# Patient Record
Sex: Male | Born: 1970 | Race: White | Hispanic: No | Marital: Single | Smoking: Current every day smoker
Health system: Southern US, Community
[De-identification: ages and names within clinical notes are randomized; demographics above are authoritative.]

## PROBLEM LIST (undated history)

## (undated) DIAGNOSIS — K219 Gastro-esophageal reflux disease without esophagitis: Secondary | ICD-10-CM

## (undated) DIAGNOSIS — F418 Other specified anxiety disorders: Secondary | ICD-10-CM

## (undated) DIAGNOSIS — J449 Chronic obstructive pulmonary disease, unspecified: Secondary | ICD-10-CM

## (undated) DIAGNOSIS — M069 Rheumatoid arthritis, unspecified: Secondary | ICD-10-CM

## (undated) DIAGNOSIS — K259 Gastric ulcer, unspecified as acute or chronic, without hemorrhage or perforation: Secondary | ICD-10-CM

## (undated) DIAGNOSIS — I10 Essential (primary) hypertension: Secondary | ICD-10-CM

## (undated) DIAGNOSIS — Z720 Tobacco use: Secondary | ICD-10-CM

## (undated) DIAGNOSIS — G4733 Obstructive sleep apnea (adult) (pediatric): Secondary | ICD-10-CM

## (undated) DIAGNOSIS — F319 Bipolar disorder, unspecified: Secondary | ICD-10-CM

## (undated) HISTORY — DX: Bipolar disorder, unspecified: F31.9

## (undated) HISTORY — PX: OTHER SURGICAL HISTORY: SHX169

## (undated) HISTORY — DX: Chronic obstructive pulmonary disease, unspecified: J44.9

## (undated) HISTORY — DX: Gastric ulcer, unspecified as acute or chronic, without hemorrhage or perforation: K25.9

## (undated) HISTORY — DX: Obstructive sleep apnea (adult) (pediatric): G47.33

---
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
## 2012-06-28 DIAGNOSIS — IMO0001 Reserved for inherently not codable concepts without codable children: Secondary | ICD-10-CM | POA: Insufficient documentation

## 2012-06-28 DIAGNOSIS — R062 Wheezing: Secondary | ICD-10-CM | POA: Insufficient documentation

## 2012-06-28 DIAGNOSIS — N529 Male erectile dysfunction, unspecified: Secondary | ICD-10-CM | POA: Insufficient documentation

## 2012-06-28 DIAGNOSIS — L409 Psoriasis, unspecified: Secondary | ICD-10-CM | POA: Insufficient documentation

## 2012-07-20 DIAGNOSIS — L720 Epidermal cyst: Secondary | ICD-10-CM | POA: Insufficient documentation

## 2018-03-27 DIAGNOSIS — J45909 Unspecified asthma, uncomplicated: Secondary | ICD-10-CM | POA: Insufficient documentation

## 2018-07-02 DIAGNOSIS — K8681 Exocrine pancreatic insufficiency: Secondary | ICD-10-CM | POA: Insufficient documentation

## 2018-07-04 DIAGNOSIS — R768 Other specified abnormal immunological findings in serum: Secondary | ICD-10-CM

## 2018-07-04 HISTORY — DX: Other specified abnormal immunological findings in serum: R76.8

## 2018-07-11 DIAGNOSIS — F17210 Nicotine dependence, cigarettes, uncomplicated: Secondary | ICD-10-CM | POA: Insufficient documentation

## 2018-08-17 DIAGNOSIS — G4733 Obstructive sleep apnea (adult) (pediatric): Secondary | ICD-10-CM | POA: Insufficient documentation

## 2018-08-29 DIAGNOSIS — M2550 Pain in unspecified joint: Secondary | ICD-10-CM | POA: Insufficient documentation

## 2018-08-29 DIAGNOSIS — Z1159 Encounter for screening for other viral diseases: Secondary | ICD-10-CM | POA: Insufficient documentation

## 2018-08-29 DIAGNOSIS — M545 Low back pain, unspecified: Secondary | ICD-10-CM | POA: Insufficient documentation

## 2018-11-29 DIAGNOSIS — R7303 Prediabetes: Secondary | ICD-10-CM | POA: Insufficient documentation

## 2019-06-05 DIAGNOSIS — E278 Other specified disorders of adrenal gland: Secondary | ICD-10-CM | POA: Insufficient documentation

## 2019-06-15 ENCOUNTER — Ambulatory Visit: Payer: Medicaid Other

## 2019-07-06 ENCOUNTER — Ambulatory Visit: Payer: Medicaid Other

## 2019-09-05 DIAGNOSIS — R942 Abnormal results of pulmonary function studies: Secondary | ICD-10-CM | POA: Insufficient documentation

## 2019-10-25 DIAGNOSIS — L600 Ingrowing nail: Secondary | ICD-10-CM | POA: Insufficient documentation

## 2020-05-28 DIAGNOSIS — W19XXXA Unspecified fall, initial encounter: Secondary | ICD-10-CM

## 2020-05-28 DIAGNOSIS — F5102 Adjustment insomnia: Secondary | ICD-10-CM | POA: Insufficient documentation

## 2020-05-28 DIAGNOSIS — R202 Paresthesia of skin: Secondary | ICD-10-CM | POA: Insufficient documentation

## 2020-05-28 DIAGNOSIS — K922 Gastrointestinal hemorrhage, unspecified: Secondary | ICD-10-CM | POA: Insufficient documentation

## 2020-05-28 DIAGNOSIS — R200 Anesthesia of skin: Secondary | ICD-10-CM | POA: Insufficient documentation

## 2020-05-28 HISTORY — DX: Unspecified fall, initial encounter: W19.XXXA

## 2020-09-28 ENCOUNTER — Emergency Department
Admission: EM | Admit: 2020-09-28 | Discharge: 2020-09-29 | Disposition: A | Discharge status: Home / Self Care | Payer: Self-pay | Attending: Emergency Medicine | Admitting: Emergency Medicine

## 2020-09-28 ENCOUNTER — Encounter: Payer: Self-pay | Admitting: *Deleted

## 2020-09-28 ENCOUNTER — Other Ambulatory Visit: Payer: Self-pay

## 2020-09-28 DIAGNOSIS — F259 Schizoaffective disorder, unspecified: Secondary | ICD-10-CM | POA: Diagnosis present

## 2020-09-28 DIAGNOSIS — F419 Anxiety disorder, unspecified: Secondary | ICD-10-CM | POA: Diagnosis present

## 2020-09-28 DIAGNOSIS — F339 Major depressive disorder, recurrent, unspecified: Secondary | ICD-10-CM | POA: Diagnosis present

## 2020-09-28 DIAGNOSIS — Z20822 Contact with and (suspected) exposure to covid-19: Secondary | ICD-10-CM | POA: Insufficient documentation

## 2020-09-28 DIAGNOSIS — F251 Schizoaffective disorder, depressive type: Secondary | ICD-10-CM | POA: Insufficient documentation

## 2020-09-28 DIAGNOSIS — F17210 Nicotine dependence, cigarettes, uncomplicated: Secondary | ICD-10-CM | POA: Insufficient documentation

## 2020-09-28 DIAGNOSIS — R45851 Suicidal ideations: Secondary | ICD-10-CM | POA: Insufficient documentation

## 2020-09-28 DIAGNOSIS — Z5900 Homelessness unspecified: Secondary | ICD-10-CM | POA: Insufficient documentation

## 2020-09-28 DIAGNOSIS — F332 Major depressive disorder, recurrent severe without psychotic features: Secondary | ICD-10-CM

## 2020-09-28 LAB — COMPREHENSIVE METABOLIC PANEL
ALT: 27 U/L (ref 0–44)
AST: 19 U/L (ref 15–41)
Albumin: 3.5 g/dL (ref 3.5–5.0)
Alkaline Phosphatase: 77 U/L (ref 38–126)
Anion gap: 10 (ref 5–15)
BUN: 9 mg/dL (ref 6–20)
CO2: 31 mmol/L (ref 22–32)
Calcium: 8.5 mg/dL — ABNORMAL LOW (ref 8.9–10.3)
Chloride: 100 mmol/L (ref 98–111)
Creatinine, Ser: 0.92 mg/dL (ref 0.61–1.24)
GFR, Estimated: >60 mL/min (ref >60)
Glucose, Bld: 100 mg/dL — ABNORMAL HIGH (ref 70–99)
Potassium: 3.5 mmol/L (ref 3.5–5.1)
Sodium: 141 mmol/L (ref 135–145)
Total Bilirubin: 0.8 mg/dL (ref 0.3–1.2)
Total Protein: 6.9 g/dL (ref 6.5–8.1)

## 2020-09-28 LAB — CBC
HCT: 49.9 % (ref 39.0–52.0)
Hemoglobin: 15.7 g/dL (ref 13.0–17.0)
MCH: 29 pg (ref 26.0–34.0)
MCHC: 31.5 g/dL (ref 30.0–36.0)
MCV: 92.1 fL (ref 80.0–100.0)
Platelets: 193 10*3/uL (ref 150–400)
RBC: 5.42 MIL/uL (ref 4.22–5.81)
RDW: 14.8 % (ref 11.5–15.5)
WBC: 10.6 10*3/uL — ABNORMAL HIGH (ref 4.0–10.5)
nRBC: 0 % (ref 0.0–0.2)

## 2020-09-28 LAB — SALICYLATE LEVEL: Salicylate Lvl: <7 mg/dL — ABNORMAL LOW (ref 7.0–30.0)

## 2020-09-28 LAB — RESP PANEL BY RT-PCR (FLU A&B, COVID) ARPGX2
Influenza A by PCR: NEGATIVE
Influenza B by PCR: NEGATIVE
SARS Coronavirus 2 by RT PCR: NEGATIVE

## 2020-09-28 LAB — ACETAMINOPHEN LEVEL: Acetaminophen (Tylenol), Serum: <10 ug/mL — ABNORMAL LOW (ref 10–30)

## 2020-09-28 LAB — ETHANOL: Alcohol, Ethyl (B): <10 mg/dL (ref <10)

## 2020-09-28 MED ORDER — FAMOTIDINE 20 MG PO TABS
20.0000 mg | ORAL_TABLET | Freq: Two times a day (BID) | ORAL | Status: DC
  Administered 2020-09-28 – 2020-09-29 (×2): 20 mg via ORAL
  Filled 2020-09-28 (×2): qty 1

## 2020-09-28 MED ORDER — RISPERIDONE 1 MG PO TABS
4.0000 mg | ORAL_TABLET | Freq: Every day | ORAL | Status: DC
  Administered 2020-09-29: 4 mg via ORAL
  Filled 2020-09-28: qty 4

## 2020-09-28 MED ORDER — AMLODIPINE BESYLATE 5 MG PO TABS: 5.0000 mg | ORAL_TABLET | Freq: Every day | ORAL | Status: DC

## 2020-09-28 MED ORDER — HYDROXYZINE HCL 25 MG PO TABS: 25.0000 mg | ORAL_TABLET | Freq: Three times a day (TID) | ORAL | Status: DC | PRN

## 2020-09-28 MED ORDER — TAMSULOSIN HCL 0.4 MG PO CAPS: 0.4000 mg | ORAL_CAPSULE | Freq: Every day | ORAL | Status: DC

## 2020-09-28 MED ORDER — DULOXETINE HCL 60 MG PO CPEP
60.0000 mg | ORAL_CAPSULE | Freq: Every day | ORAL | Status: DC
  Administered 2020-09-29: 60 mg via ORAL
  Filled 2020-09-28: qty 1

## 2020-09-28 NOTE — ED Notes (Addendum)
Blue short sleeve shirt [employer] Black sneakers Black socks Wallace Cullens boxers  [employer]

## 2020-09-28 NOTE — ED Notes (Signed)
Dinner tray provided for pt 

## 2020-09-28 NOTE — BH Assessment (Signed)
Comprehensive Clinical Assessment (CCA) Note  09/28/2020 Ad Guttman [id number]  Chief Complaint: Patient is a 50 year old male presenting to [facility] ED voluntarily. Per triage note Pt reports he is homeless and SI.   Pt states he is stressed out about being homeless.  Pt denies drug or etoh use.  Pt calm and cooperative. During assessment patient appears alert and oriented x4, calm and cooperative. Patient reports "I feel like I want to hurt myself." Patient reports that a lot of of suicidal ideation comes from being homeless and without much financial assistance. Patient reports that he used to work in [place] but lost his job 2 years ago. Patient reports that he currently has a psychiatrist "Dr. Artis Delay" who is located at [facility] and is taking his medications as prescribed. Patient also reports AH and VH, he describes his AH as "laughter" and his VH "they tell me to do bad things like kill other people." Patient continues to report SI/AH/VH and does not appear to be responding to any internal or external stimuli.  Per Psyc NP Elenore Paddy patient does not meet criteria for Inpatient Chief Complaint  Patient presents with   Suicidal   Visit Diagnosis: Depression    CCA Screening, Triage and Referral (STR)  Patient Reported Information How did you hear about [place]? Self  Referral name: No data recorded Referral phone number: No data recorded  Whom do you see for routine medical problems? No data recorded Practice/Facility Name: No data recorded Practice/Facility Phone Number: No data recorded Name of Contact: No data recorded Contact Number: No data recorded Contact Fax Number: No data recorded Prescriber Name: No data recorded Prescriber Address (if known): No data recorded  What Is the Reason for Your Visit/Call Today? Patient presents voluntarily for SI and depression  How Long Has This Been Causing You Problems? > than 6 months  What Do You Feel Would Help You the Most  Today? Housing Assistance; Financial Resources   Have You Recently Been in Any Inpatient Treatment (Hospital/Detox/Crisis Center/28-Day Program)? No data recorded Name/Location of Program/Hospital:No data recorded How Long Were You There? No data recorded When Were You Discharged? No data recorded  Have You Ever Received Services From [facility] Before? No data recorded Who Do You See at [facility]? No data recorded  Have You Recently Had Any Thoughts About Hurting Yourself? Yes  Are You Planning to Commit Suicide/Harm Yourself At This time? Yes   Have you Recently Had Thoughts About Hurting Someone Karolee Ohs? No  Explanation: No data recorded  Have You Used Any Alcohol or Drugs in the Past 24 Hours? No  How Long Ago Did You Use Drugs or Alcohol? No data recorded What Did You Use and How Much? No data recorded  Do You Currently Have a Therapist/Psychiatrist? Yes  Name of Therapist/Psychiatrist: Dr. Jaynee Eagles   Have You Been Recently Discharged From Any Office Practice or Programs? No  Explanation of Discharge From Practice/Program: No data recorded    CCA Screening Triage Referral Assessment Type of Contact: Face-to-Face  Is this Initial or Reassessment? No data recorded Date Telepsych consult ordered in [facility]:  No data recorded Time Telepsych consult ordered in [facility]:  No data recorded  Patient Reported Information Reviewed? No data recorded Patient Left Without Being Seen? No data recorded Reason for Not Completing Assessment: No data recorded  Collateral Involvement: No data recorded  Does Patient Have a Court Appointed Legal Guardian? No data recorded Name and Contact of Legal Guardian: No  data recorded If Minor and Not Living with Parent(s), Who has Custody? No data recorded Is CPS involved or ever been involved? Never  Is APS involved or ever been involved? Never   Patient Determined To Be At Risk for Harm To Self or Others Based on Review of Patient  Reported Information or Presenting Complaint? No  Method: No data recorded Availability of Means: No data recorded Intent: No data recorded Notification Required: No data recorded Additional Information for Danger to Others Potential: No data recorded Additional Comments for Danger to Others Potential: No data recorded Are There Guns or Other Weapons in Your Home? No data recorded Types of Guns/Weapons: No data recorded Are These Weapons Safely Secured?                            No data recorded Who Could Verify You Are Able To Have These Secured: No data recorded Do You Have any Outstanding Charges, Pending Court Dates, Parole/Probation? No data recorded Contacted To Inform of Risk of Harm To Self or Others: No data recorded  Location of Assessment: [facility] ED   Does Patient Present under Involuntary Commitment? No  IVC Papers Initial File Date: No data recorded  [place] of Residence: [place]   Patient Currently Receiving the Following Services: No data recorded  Determination of Need: Emergent (2 hours)   Options For Referral: No data recorded    CCA Biopsychosocial Intake/Chief Complaint:  No data recorded Current Symptoms/Problems: No data recorded  Patient Reported Schizophrenia/Schizoaffective Diagnosis in Past: No   Strengths: Patient is able to communicate  Preferences: No data recorded Abilities: No data recorded  Type of Services Patient Feels are Needed: No data recorded  Initial Clinical Notes/Concerns: No data recorded  Mental Health Symptoms Depression:   Change in energy/activity; Fatigue; Hopelessness; Worthlessness   Duration of Depressive symptoms:  Greater than two weeks   Mania:   None   Anxiety:    None   Psychosis:   None   Duration of Psychotic symptoms: No data recorded  Trauma:   None   Obsessions:   None   Compulsions:   None   Inattention:   None   Hyperactivity/Impulsivity:   None   Oppositional/Defiant  Behaviors:   None   Emotional Irregularity:   None   Other Mood/Personality Symptoms:  No data recorded   Mental Status Exam Appearance and self-care  Stature:   Tall   Weight:   Obese   Clothing:   Casual   Grooming:   Normal   Cosmetic use:   None   Posture/gait:   Normal   Motor activity:   Not Remarkable   Sensorium  Attention:   Normal   Concentration:   Normal   Orientation:   X5   Recall/memory:   Normal   Affect and Mood  Affect:   Appropriate   Mood:   Depressed   Relating  Eye contact:   Normal   Facial expression:   Responsive   Attitude toward examiner:   Cooperative; Irritable   Thought and Language  Speech flow:  Slurred; Slow   Thought content:   Appropriate to Mood and Circumstances   Preoccupation:   None   Hallucinations:   Visual; Auditory   Organization:  No data recorded  [employer] of Knowledge:   Fair   Intelligence:   Average   Abstraction:   Functional   Judgement:   Fair  Reality Testing:   Realistic   Insight:   Good   Decision Making:   Normal   Social Functioning  Social Maturity:   Responsible   Social Judgement:   Normal   Stress  Stressors:   Housing; Office manager Ability:   Normal   Skill Deficits:   None   Supports:   Support needed     Religion: Religion/Spirituality Are You A Religious Person?: No  Leisure/Recreation: Leisure / Recreation Do You Have Hobbies?: No  Exercise/Diet: Exercise/Diet Do You Exercise?: No Have You Gained or Lost A Significant Amount of Weight in the Past Six Months?: No Do You Follow a Special Diet?: No Do You Have Any Trouble Sleeping?: No   CCA Employment/Education Employment/Work Situation: Employment / Work Situation Employment Situation: Unemployed Patient's Job has Been Impacted by Current Illness: No Has Patient ever Been in Equities trader?: No  Education: Education Is Patient Currently  Attending School?: No Did You Have An Individualized Education Program (IIEP): No Did You Have Any Difficulty At [employer]?: No Patient's Education Has Been Impacted by Current Illness: No   CCA Family/Childhood History Family and Relationship History: Family history Marital status: Single Does patient have children?: No  Childhood History:  Childhood History By whom was/is the patient raised?: Both parents Did patient suffer any verbal/emotional/physical/sexual abuse as a child?: No Did patient suffer from severe childhood neglect?: No Has patient ever been sexually abused/assaulted/raped as an adolescent or adult?: No Was the patient ever a victim of a crime or a disaster?: No Witnessed domestic violence?: No Has patient been affected by domestic violence as an adult?: No  Child/Adolescent Assessment:     CCA Substance Use Alcohol/Drug Use: Alcohol / Drug Use Pain Medications: See MAR Prescriptions: See MAR Over the Counter: See MAR History of alcohol / drug use?: No history of alcohol / drug abuse                         ASAM's:  Six Dimensions of Multidimensional Assessment  Dimension 1:  Acute Intoxication and/or Withdrawal Potential:      Dimension 2:  Biomedical Conditions and Complications:      Dimension 3:  Emotional, Behavioral, or Cognitive Conditions and Complications:     Dimension 4:  Readiness to Change:     Dimension 5:  Relapse, Continued use, or Continued Problem Potential:     Dimension 6:  Recovery/Living Environment:     ASAM Severity Score:    ASAM Recommended Level of Treatment:     Substance use Disorder (SUD)    Recommendations for Services/Supports/Treatments:  Psyc cleared  DSM5 Diagnoses: There are no problems to display for this patient.   Patient Centered Plan: Patient is on the following Treatment Plan(s):  Depression   Referrals to Alternative Service(s): Referred to Alternative Service(s):   Place:   Date:    Time:    Referred to Alternative Service(s):   Place:   Date:   Time:    Referred to Alternative Service(s):   Place:   Date:   Time:    Referred to Alternative Service(s):   Place:   Date:   Time:     Tatem Holsonback A Lindwood Mogel, LCAS-A

## 2020-09-28 NOTE — ED Provider Notes (Signed)
[facility] Emergency Department Provider Note  ____________________________________________   Event Date/Time   First MD Initiated Contact with Patient 09/28/20 1904     (approximate)  I have reviewed the triage vital signs and the nursing notes.   HISTORY  Chief Complaint Suicidal    HPI Raymond Hood is a 50 y.o. male who is homeless with SI.  Patient reports feeling SI for the past few days, constant, has a plan to cut himself but does not have a knife on him.  This is been severe.  He states that he stressed about being homeless.  He reports being compliant with his medications.  He denies any alcohol or drug use and denies any other medical concerns    Medical: Hypertension schizo affective    Prior to Admission medications   Not on File    Allergies Patient has no known allergies.  No family history on file.  Social History Social History   Tobacco Use   Smoking status: Every Day    Types: Cigarettes   Smokeless tobacco: Never  Substance Use Topics   Alcohol use: Not Currently      Review of Systems Constitutional: No fever/chills Eyes: No visual changes. ENT: No sore throat. Cardiovascular: Denies chest pain. Respiratory: Denies shortness of breath. Gastrointestinal: No abdominal pain.  No nausea, no vomiting.  No diarrhea.  No constipation. Genitourinary: Negative for dysuria. Musculoskeletal: Negative for back pain. Skin: Negative for rash. Neurological: Negative for headaches, focal weakness or numbness. Psych: SI All other ROS negative ____________________________________________   PHYSICAL EXAM:  VITAL SIGNS: ED Triage Vitals  Enc Vitals Group     BP 09/28/20 1848 118/79     Pulse Rate 09/28/20 1848 85     Resp 09/28/20 1848 20     Temp 09/28/20 1848 98.2 F (36.8 C)     Temp Source 09/28/20 1848 Oral     SpO2 09/28/20 1848 97 %     Weight 09/28/20 1842 290 lb (131.5 kg)     Height 09/28/20 1842 5\' 8"   (1.727 m)     Head Circumference --      Peak Flow --      Pain Score 09/28/20 1842 5     Pain Loc --      Pain Edu? --      Excl. in GC? --     Constitutional: Alert and oriented. Well appearing and in no acute distress. Eyes: Conjunctivae are normal. No swelling around eyes Head: Atraumatic. Nose: No congestion/rhinnorhea. Mouth/Throat: Mucous membranes are moist.   Neck: No stridor. Trachea Midline. FROM Cardiovascular: Normal rate, no swelling noted Respiratory: No increased wob, no stridor Gastrointestinal: Soft and nontender. No distention. No abdominal bruits.  Musculoskeletal: No lower extremity tenderness nor edema.  No joint effusions. Neurologic:  Normal speech and language. No gross focal neurologic deficits are appreciated.  Skin:  Skin is warm, dry and intact. No rash noted. Psychiatric: Positive SI GU: Deferred   ____________________________________________   LABS (all labs ordered are listed, but only abnormal results are displayed)  Labs Reviewed  CBC - Abnormal; Notable for the following components:      Result Value   WBC 10.6 (*)    All other components within normal limits  ETHANOL  COMPREHENSIVE METABOLIC PANEL  SALICYLATE LEVEL  ACETAMINOPHEN LEVEL  URINE DRUG SCREEN, QUALITATIVE ([facility] ONLY)   ____________________________________________   INITIAL IMPRESSION / ASSESSMENT AND PLAN / ED COURSE  Raymond Hood was evaluated in Emergency  Department on 09/28/2020 for the symptoms described in the history of present illness. He was evaluated in the context of the global COVID-19 pandemic, which necessitated consideration that the patient might be at risk for infection with the SARS-CoV-2 virus that causes COVID-19. Institutional protocols and algorithms that pertain to the evaluation of patients at risk for COVID-19 are in a state of rapid change based on information released by regulatory bodies including the CDC and federal and state organizations. These  policies and algorithms were followed during the patient's care in the ED.    Pt is without any acute medical complaints.  Patient willing to stay voluntary. no exam findings to suggest medical cause of current presentation. Will order psychiatric screening labs and discuss further w/ psychiatric service.  D/d includes but is not limited to psychiatric disease, behavioral/personality disorder, inadequate socioeconomic support, medical.  Based on HPI, exam, unremarkable labs, no concern for acute medical problem at this time. No rigidity, clonus, hyperthermia, focal neurologic deficit, diaphoresis, tachycardia, meningismus, ataxia, gait abnormality or other finding to suggest this visit represents a non-psychiatric problem. Screening labs reviewed.    Given this, pt medically cleared, to be dispositioned per Psych.    The patient has been placed in psychiatric observation due to the need to provide a safe environment for the patient while obtaining psychiatric consultation and evaluation, as well as ongoing medical and medication management to treat the patient's condition.  The patient has not been placed under full IVC at this time.        ____________________________________________   FINAL CLINICAL IMPRESSION(S) / ED DIAGNOSES   Final diagnoses:  Suicidal ideation      MEDICATIONS GIVEN DURING THIS VISIT:  Medications - No data to display   ED Discharge Orders     None        Note:  This document was prepared using Dragon voice recognition software and may include unintentional dictation errors.    Concha Se, MD 09/28/20 2011

## 2020-09-28 NOTE — ED Triage Notes (Signed)
Pt reports he is homeless and SI.   Pt states he is stressed out about being homeless.  Pt denies drug or etoh use.  Pt calm and cooperative.

## 2020-09-28 NOTE — ED Notes (Signed)
Intake at the bedside for pt evaluation

## 2020-09-28 NOTE — ED Notes (Signed)
Pt reports he no longer takes flomax or norvasc.

## 2020-09-28 NOTE — Consult Note (Signed)
[facility] Face-to-Face Psychiatry Consult   Reason for Consult: Suicidal Referring Physician: Dr. Fuller Plan Patient Identification: Raymond Hood MRN:  [id number] Principal Diagnosis: <principal problem not specified> Diagnosis:  Active Problems:   MDD (major depressive disorder), recurrent episode (HCC)   Anxiety   Total Time spent with patient: 1 hour  Subjective: "I have no family." Raymond Hood is a 50 y.o. male patient presented to [facility] ED via POV voluntary. The patient stated he lived in [place] but was brought to [place] by a friend. He shared that his suicide ideation is because he is homeless. The patient does not have a history of suicide attempts and voiced that he was a small child the last time he was hospitalized for any psychiatric issues. The patient reports that he used to work in [place] but lost his job two years ago. The patient says he currently has a psychiatrist "Dr. Artis Delay" at [facility], and he is taking his medications as prescribed. The patient was seen face-to-face by this provider; the chart was reviewed and consulted with Dr. Fuller Plan on 09/28/2020 due to the patient's care. It was discussed with the EDP that the patient does not meet the criteria to be admitted to the psychiatric inpatient unit. The patient could benefit from Social Work services to assist him in getting to a shelter. On evaluation, the patient is alert and oriented x4, irritated but cooperative, and mood-congruent with affect.  The patient does not appear to be responding to internal or external stimuli. Neither is the patient presenting with any delusional thinking. The patient admits to auditory and visual hallucinations. He describes his AH as "laughter" and VH as "they tell me to do bad things like kill other people." The patient admits to suicidal, homicidal ideations. The patient is not presenting with any psychotic or paranoid behaviors.   HPI: Per Dr. Fuller Plan, Raymond Hood is a 50 y.o. male who is  homeless with SI.  Patient reports feeling SI for the past few days, constant, has a plan to cut himself but does not have a knife on him.  This is been severe.  He states that he stressed about being homeless.  He reports being compliant with his medications.  He denies any alcohol or drug use and denies any other medical concerns   Medical: Hypertension schizo affective   Past Psychiatric History:   Risk to Self:   Risk to Others:   Prior Inpatient Therapy:   Prior Outpatient Therapy:    Past Medical History: No past medical history on file.  Family History: No family history on file. Family Psychiatric  History: Mother- Paranoid Schizophrenia Bipolar type. Social History:  Social History   Substance and Sexual Activity  Alcohol Use Not Currently     Social History   Substance and Sexual Activity  Drug Use Not on file    Social History   Socioeconomic History   Marital status: Single    Spouse name: Not on file   Number of children: Not on file   Years of education: Not on file   Highest education level: Not on file  Occupational History   Not on file  Tobacco Use   Smoking status: Every Day    Types: Cigarettes   Smokeless tobacco: Never  Substance and Sexual Activity   Alcohol use: Not Currently   Drug use: Not on file   Sexual activity: Not on file  Other Topics Concern   Not on file  Social History Narrative   Not  on file   Social Determinants of Health   Financial Resource Strain: Not on file  Food Insecurity: Not on file  Transportation Needs: Not on file  Physical Activity: Not on file  Stress: Not on file  Social Connections: Not on file   Additional Social History:    Allergies:  No Known Allergies  Labs:  Results for orders placed or performed during the hospital encounter of 09/28/20 (from the past 48 hour(s))  Comprehensive metabolic panel     Status: Abnormal   Collection Time: 09/28/20  6:50 PM  Result Value Ref Range   Sodium 141 135  - 145 mmol/L   Potassium 3.5 3.5 - 5.1 mmol/L   Chloride 100 98 - 111 mmol/L   CO2 31 22 - 32 mmol/L   Glucose, Bld 100 (H) 70 - 99 mg/dL    Comment: Glucose reference range applies only to samples taken after fasting for at least 8 hours.   BUN 9 6 - 20 mg/dL   Creatinine, Ser 7.09 0.61 - 1.24 mg/dL   Calcium 8.5 (L) 8.9 - 10.3 mg/dL   Total Protein 6.9 6.5 - 8.1 g/dL   Albumin 3.5 3.5 - 5.0 g/dL   AST 19 15 - 41 U/L   ALT 27 0 - 44 U/L   Alkaline Phosphatase 77 38 - 126 U/L   Total Bilirubin 0.8 0.3 - 1.2 mg/dL   GFR, Estimated >62 >83 mL/min    Comment: (NOTE) Calculated using the CKD-EPI Creatinine Equation (2021)    Anion gap 10 5 - 15    Comment: Performed at [facility], [address] [place], [place] [address]  Ethanol     Status: None   Collection Time: 09/28/20  6:50 PM  Result Value Ref Range   Alcohol, Ethyl (B) <10 <10 mg/dL    Comment: (NOTE) Lowest detectable limit for serum alcohol is 10 mg/dL.  For medical purposes only. Performed at [facility], [address] [place], [place] [address]   Salicylate level     Status: Abnormal   Collection Time: 09/28/20  6:50 PM  Result Value Ref Range   Salicylate Lvl <7.0 (L) 7.0 - 30.0 mg/dL    Comment: Performed at [facility], [address] [place], [place] [address]  Acetaminophen level     Status: Abnormal   Collection Time: 09/28/20  6:50 PM  Result Value Ref Range   Acetaminophen (Tylenol), Serum <10 (L) 10 - 30 ug/mL    Comment: (NOTE) Therapeutic concentrations vary significantly. A range of 10-30 ug/mL  may be an effective concentration for many patients. However, some  are best treated at concentrations outside of this range. Acetaminophen concentrations >150 ug/mL at 4 hours after ingestion  and >50 ug/mL at 12 hours after ingestion are often associated with  toxic reactions.  Performed at [facility], [address] [place], [place] [address]   cbc      Status: Abnormal   Collection Time: 09/28/20  6:50 PM  Result Value Ref Range   WBC 10.6 (H) 4.0 - 10.5 K/uL   RBC 5.42 4.22 - 5.81 MIL/uL   Hemoglobin 15.7 13.0 - 17.0 g/dL   HCT 27.5 17.0 - 01.7 %   MCV 92.1 80.0 - 100.0 fL   MCH 29.0 26.0 - 34.0 pg   MCHC 31.5 30.0 - 36.0 g/dL   RDW 49.4 49.6 - 75.9 %   Platelets 193 150 - 400 K/uL   nRBC 0.0 0.0 -  0.2 %    Comment: Performed at [facility], [address] [place], [place] [address]  Resp Panel by RT-PCR (Flu A&B, Covid) Nasopharyngeal Swab     Status: None   Collection Time: 09/28/20  8:08 PM   Specimen: Nasopharyngeal Swab; Nasopharyngeal(NP) swabs in vial transport medium  Result Value Ref Range   SARS Coronavirus 2 by RT PCR NEGATIVE NEGATIVE    Comment: (NOTE) SARS-CoV-2 target nucleic acids are NOT DETECTED.  The SARS-CoV-2 RNA is generally detectable in upper respiratory specimens during the acute phase of infection. The lowest concentration of SARS-CoV-2 viral copies this assay can detect is 138 copies/mL. A negative result does not preclude SARS-Cov-2 infection and should not be used as the sole basis for treatment or other patient management decisions. A negative result may occur with  improper specimen collection/handling, submission of specimen other than nasopharyngeal swab, presence of viral mutation(s) within the areas targeted by this assay, and inadequate number of viral copies(<138 copies/mL). A negative result must be combined with clinical observations, patient history, and epidemiological information. The expected result is Negative.  Fact Sheet for Patients:  BloggerCourse.com  Fact Sheet for Healthcare Providers:  SeriousBroker.it  This test is no t yet approved or cleared by the [place] FDA and  has been authorized for detection and/or diagnosis of SARS-CoV-2 by FDA under an Emergency Use Authorization (EUA). This EUA will remain   in effect (meaning this test can be used) for the duration of the COVID-19 declaration under Section 564(b)(1) of the Act, 21 U.S.C.section 360bbb-3(b)(1), unless the authorization is terminated  or revoked sooner.       Influenza A by PCR NEGATIVE NEGATIVE   Influenza B by PCR NEGATIVE NEGATIVE    Comment: (NOTE) The Xpert Xpress SARS-CoV-2/FLU/RSV plus assay is intended as an aid in the diagnosis of influenza from Nasopharyngeal swab specimens and should not be used as a sole basis for treatment. Nasal washings and aspirates are unacceptable for Xpert Xpress SARS-CoV-2/FLU/RSV testing.  Fact Sheet for Patients: BloggerCourse.com  Fact Sheet for Healthcare Providers: SeriousBroker.it  This test is not yet approved or cleared by the [place] FDA and has been authorized for detection and/or diagnosis of SARS-CoV-2 by FDA under an Emergency Use Authorization (EUA). This EUA will remain in effect (meaning this test can be used) for the duration of the COVID-19 declaration under Section 564(b)(1) of the Act, 21 U.S.C. section 360bbb-3(b)(1), unless the authorization is terminated or revoked.  Performed at [facility], [address] [place], [place] [address]     Current Facility-Administered Medications  Medication Dose Route Frequency Provider Last Rate Last Admin   amLODipine (NORVASC) tablet 5 mg  5 mg Oral Daily Concha Se, MD       DULoxetine (CYMBALTA) DR capsule 60 mg  60 mg Oral Daily Concha Se, MD       famotidine (PEPCID) tablet 20 mg  20 mg Oral BID Concha Se, MD       hydrOXYzine (ATARAX/VISTARIL) tablet 25 mg  25 mg Oral TID PRN Concha Se, MD       risperiDONE (RISPERDAL) tablet 4 mg  4 mg Oral Daily Concha Se, MD       tamsulosin (FLOMAX) capsule 0.4 mg  0.4 mg Oral Daily Concha Se, MD       Current Outpatient Medications  Medication Sig Dispense Refill   Adalimumab 40  MG/0.4ML PSKT Inject 40 mg into the skin  every 14 (fourteen) days.     amLODipine (NORVASC) 5 MG tablet Take 5 mg by mouth daily.     Cholecalciferol 25 MCG (1000 UT) capsule Take 3,000 Units by mouth daily.     DULoxetine (CYMBALTA) 60 MG capsule Take 60 mg by mouth daily.     famotidine (PEPCID) 20 MG tablet Take 20 mg by mouth 2 (two) times daily.     Fluticasone-Umeclidin-Vilant (TRELEGY ELLIPTA) 100-62.5-25 MCG/INH AEPB Inhale 1 puff into the lungs daily.     hydrOXYzine (ATARAX/VISTARIL) 25 MG tablet Take 25 mg by mouth 3 (three) times daily as needed for anxiety or sleep.     Multiple Vitamin (MULTI-VITAMINS) TABS Take 1 tablet by mouth daily.     risperidone (RISPERDAL) 4 MG tablet Take 4 mg by mouth daily.     tamsulosin (FLOMAX) 0.4 MG CAPS capsule Take 0.4 mg by mouth daily.      Musculoskeletal: Strength & Muscle Tone: within normal limits Gait & Station: normal Patient leans: N/A  Psychiatric Specialty Exam:  Presentation  General Appearance: Appropriate for Environment  Eye Contact:Fair  Speech:Slurred; Garbled  Speech Volume:Decreased  Handedness:Right   Mood and Affect  Mood:Depressed  Affect:Blunt  Thought Process  Thought Processes:Coherent  Descriptions of Associations:Intact  Orientation:Full (Time, Place and Person)  Thought Content:Logical  History of Schizophrenia/Schizoaffective disorder:No  Duration of Psychotic Symptoms:No data recorded Hallucinations:Hallucinations: None  Ideas of Reference:None  Suicidal Thoughts:Suicidal Thoughts: Yes, Passive SI Passive Intent and/or Plan: Without Intent; Without Plan  Homicidal Thoughts:Homicidal Thoughts: No   Sensorium  Memory:Immediate Good; Recent Good; Remote Good  Judgment:Fair  Insight:Fair   Executive Functions  Concentration:Fair  Attention Span:Fair  Recall:Fair  Fund of Knowledge:Fair  Language:Fair   Psychomotor Activity  Psychomotor Activity:Psychomotor  Activity: Normal   Assets  Assets:Desire for Improvement; Financial Resources/Insurance; Housing; Physical Health; Resilience; Social Support   Sleep  Sleep:Sleep: Good   Physical Exam: Physical Exam Vitals and nursing note reviewed.  Constitutional:      Appearance: Normal appearance. He is obese.  HENT:     Head: Normocephalic and atraumatic.     Nose: Nose normal.  Cardiovascular:     Rate and Rhythm: Normal rate.     Pulses: Normal pulses.  Pulmonary:     Effort: Pulmonary effort is normal.  Musculoskeletal:        General: Normal range of motion.     Cervical back: Normal range of motion and neck supple.  Neurological:     General: No focal deficit present.     Mental Status: He is alert and oriented to person, place, and time.  Psychiatric:        Attention and Perception: Attention and perception normal.        Mood and Affect: Mood is depressed. Affect is blunt.        Speech: Speech normal.        Behavior: Behavior normal.        Thought Content: Thought content includes suicidal ideation.        Cognition and Memory: Cognition and memory normal.        Judgment: Judgment normal.   Review of Systems  Psychiatric/Behavioral:  Positive for depression and suicidal ideas. The patient is nervous/anxious.   All other systems reviewed and are negative. Blood pressure 118/79, pulse 85, temperature 98.2 F (36.8 C), temperature source Oral, resp. rate 20, height 5\' 8"  (1.727 m), weight 131.5 kg, SpO2 97 %. Body mass index is 44.09 kg/m.  Treatment Plan Summary: Plan Provide the patient with information to shelters. The patient does have an outpatient provider with [facility].  Disposition: No evidence of imminent risk to self or others at present.   Patient does not meet criteria for psychiatric inpatient admission. Supportive therapy provided about ongoing stressors. Discussed crisis plan, support from social network, calling 911, coming to the Emergency Department,  and calling Suicide Hotline.   Gillermo Murdoch, NP 09/28/2020 11:26 PM

## 2020-09-29 DIAGNOSIS — F259 Schizoaffective disorder, unspecified: Secondary | ICD-10-CM

## 2020-09-29 NOTE — ED Notes (Signed)
VOL, consult complete does not meet IP criteria

## 2020-09-29 NOTE — ED Notes (Signed)
Verified correct patient and correct discharge papers given. Pt alert and oriented X 4, stable for discharge. RR even and unlabored, color WNL. Discussed discharge instructions and follow-up as directed. Discharge medications discussed, when prescribed. Pt had opportunity to ask questions, and RN available to provide patient and/or family education. Given information regarding both [facility] and [employer] [facility]. Given both bags of belongings, meal tray and snack at time of discharge.

## 2020-09-29 NOTE — ED Provider Notes (Signed)
Patient cleared for d/c by behavioral team.    Jene Every, MD 09/29/20 1229

## 2020-10-14 ENCOUNTER — Other Ambulatory Visit: Payer: Self-pay

## 2020-10-14 DIAGNOSIS — N200 Calculus of kidney: Secondary | ICD-10-CM | POA: Diagnosis present

## 2020-10-14 DIAGNOSIS — Z833 Family history of diabetes mellitus: Secondary | ICD-10-CM

## 2020-10-14 DIAGNOSIS — K5730 Diverticulosis of large intestine without perforation or abscess without bleeding: Secondary | ICD-10-CM | POA: Diagnosis present

## 2020-10-14 DIAGNOSIS — F259 Schizoaffective disorder, unspecified: Secondary | ICD-10-CM | POA: Diagnosis present

## 2020-10-14 DIAGNOSIS — J449 Chronic obstructive pulmonary disease, unspecified: Secondary | ICD-10-CM | POA: Diagnosis present

## 2020-10-14 DIAGNOSIS — N2889 Other specified disorders of kidney and ureter: Principal | ICD-10-CM | POA: Diagnosis present

## 2020-10-14 DIAGNOSIS — M069 Rheumatoid arthritis, unspecified: Secondary | ICD-10-CM | POA: Diagnosis present

## 2020-10-14 DIAGNOSIS — F32A Depression, unspecified: Secondary | ICD-10-CM | POA: Diagnosis present

## 2020-10-14 DIAGNOSIS — K219 Gastro-esophageal reflux disease without esophagitis: Secondary | ICD-10-CM | POA: Diagnosis present

## 2020-10-14 DIAGNOSIS — Z20822 Contact with and (suspected) exposure to covid-19: Secondary | ICD-10-CM | POA: Diagnosis present

## 2020-10-14 DIAGNOSIS — R7989 Other specified abnormal findings of blood chemistry: Secondary | ICD-10-CM | POA: Diagnosis present

## 2020-10-14 DIAGNOSIS — E1165 Type 2 diabetes mellitus with hyperglycemia: Secondary | ICD-10-CM | POA: Diagnosis present

## 2020-10-14 DIAGNOSIS — F17210 Nicotine dependence, cigarettes, uncomplicated: Secondary | ICD-10-CM | POA: Diagnosis present

## 2020-10-14 DIAGNOSIS — Z5900 Homelessness unspecified: Secondary | ICD-10-CM

## 2020-10-14 DIAGNOSIS — K760 Fatty (change of) liver, not elsewhere classified: Secondary | ICD-10-CM | POA: Diagnosis present

## 2020-10-14 DIAGNOSIS — I10 Essential (primary) hypertension: Secondary | ICD-10-CM | POA: Diagnosis present

## 2020-10-14 DIAGNOSIS — Z79899 Other long term (current) drug therapy: Secondary | ICD-10-CM

## 2020-10-14 DIAGNOSIS — E662 Morbid (severe) obesity with alveolar hypoventilation: Secondary | ICD-10-CM | POA: Diagnosis present

## 2020-10-14 DIAGNOSIS — Z7951 Long term (current) use of inhaled steroids: Secondary | ICD-10-CM

## 2020-10-14 DIAGNOSIS — E8770 Fluid overload, unspecified: Secondary | ICD-10-CM | POA: Diagnosis present

## 2020-10-14 DIAGNOSIS — Z6841 Body Mass Index (BMI) 40.0 and over, adult: Secondary | ICD-10-CM

## 2020-10-14 DIAGNOSIS — Z79620 Long term (current) use of immunosuppressive biologic: Secondary | ICD-10-CM

## 2020-10-14 DIAGNOSIS — Z818 Family history of other mental and behavioral disorders: Secondary | ICD-10-CM

## 2020-10-14 DIAGNOSIS — F419 Anxiety disorder, unspecified: Secondary | ICD-10-CM | POA: Diagnosis present

## 2020-10-14 LAB — COMPREHENSIVE METABOLIC PANEL WITH GFR
ALT: 23 U/L (ref 0–44)
AST: 19 U/L (ref 15–41)
Albumin: 3.4 g/dL — ABNORMAL LOW (ref 3.5–5.0)
Alkaline Phosphatase: 76 U/L (ref 38–126)
Anion gap: 10 (ref 5–15)
BUN: 16 mg/dL (ref 6–20)
CO2: 28 mmol/L (ref 22–32)
Calcium: 8.4 mg/dL — ABNORMAL LOW (ref 8.9–10.3)
Chloride: 99 mmol/L (ref 98–111)
Creatinine, Ser: 1.03 mg/dL (ref 0.61–1.24)
GFR, Estimated: >60 mL/min (ref >60)
Glucose, Bld: 148 mg/dL — ABNORMAL HIGH (ref 70–99)
Potassium: 3.9 mmol/L (ref 3.5–5.1)
Sodium: 137 mmol/L (ref 135–145)
Total Bilirubin: 0.6 mg/dL (ref 0.3–1.2)
Total Protein: 7.1 g/dL (ref 6.5–8.1)

## 2020-10-14 LAB — CBC
HCT: 48.7 % (ref 39.0–52.0)
Hemoglobin: 16.4 g/dL (ref 13.0–17.0)
MCH: 30.7 pg (ref 26.0–34.0)
MCHC: 33.7 g/dL (ref 30.0–36.0)
MCV: 91.2 fL (ref 80.0–100.0)
Platelets: 191 K/uL (ref 150–400)
RBC: 5.34 MIL/uL (ref 4.22–5.81)
RDW: 14.4 % (ref 11.5–15.5)
WBC: 9.4 K/uL (ref 4.0–10.5)
nRBC: 0 % (ref 0.0–0.2)

## 2020-10-14 LAB — LIPASE, BLOOD: Lipase: 36 U/L (ref 11–51)

## 2020-10-14 NOTE — ED Triage Notes (Signed)
Pt presents to ER via ems c/o umbilical abd pain that started around 2 days ago.  Pt states pain feels like somebody is stabbing him with a knife and twisting it.  Pt states he is having diarrhea as well.  Pt A&O x4 at this time and in NAD.

## 2020-10-15 ENCOUNTER — Emergency Department: Payer: Self-pay

## 2020-10-15 ENCOUNTER — Encounter: Payer: Self-pay | Admitting: Internal Medicine

## 2020-10-15 ENCOUNTER — Inpatient Hospital Stay
Admission: EM | Admit: 2020-10-15 | Discharge: 2020-10-21 | DRG: 699 | Disposition: A | Discharge status: Home / Self Care | Payer: Self-pay | Attending: Obstetrics and Gynecology | Admitting: Obstetrics and Gynecology

## 2020-10-15 ENCOUNTER — Encounter: Payer: Self-pay | Admitting: Radiology

## 2020-10-15 DIAGNOSIS — E1159 Type 2 diabetes mellitus with other circulatory complications: Secondary | ICD-10-CM | POA: Diagnosis present

## 2020-10-15 DIAGNOSIS — J441 Chronic obstructive pulmonary disease with (acute) exacerbation: Secondary | ICD-10-CM | POA: Diagnosis present

## 2020-10-15 DIAGNOSIS — R109 Unspecified abdominal pain: Secondary | ICD-10-CM | POA: Diagnosis present

## 2020-10-15 DIAGNOSIS — R1030 Lower abdominal pain, unspecified: Secondary | ICD-10-CM

## 2020-10-15 DIAGNOSIS — F259 Schizoaffective disorder, unspecified: Secondary | ICD-10-CM

## 2020-10-15 DIAGNOSIS — R197 Diarrhea, unspecified: Secondary | ICD-10-CM | POA: Diagnosis present

## 2020-10-15 DIAGNOSIS — I10 Essential (primary) hypertension: Secondary | ICD-10-CM

## 2020-10-15 DIAGNOSIS — Z720 Tobacco use: Secondary | ICD-10-CM

## 2020-10-15 DIAGNOSIS — R600 Localized edema: Secondary | ICD-10-CM

## 2020-10-15 DIAGNOSIS — M79604 Pain in right leg: Secondary | ICD-10-CM

## 2020-10-15 DIAGNOSIS — K219 Gastro-esophageal reflux disease without esophagitis: Secondary | ICD-10-CM | POA: Insufficient documentation

## 2020-10-15 DIAGNOSIS — R0902 Hypoxemia: Secondary | ICD-10-CM

## 2020-10-15 DIAGNOSIS — F418 Other specified anxiety disorders: Secondary | ICD-10-CM | POA: Diagnosis present

## 2020-10-15 DIAGNOSIS — Z5900 Homelessness unspecified: Secondary | ICD-10-CM

## 2020-10-15 DIAGNOSIS — Z5901 Sheltered homelessness: Secondary | ICD-10-CM

## 2020-10-15 DIAGNOSIS — N2889 Other specified disorders of kidney and ureter: Secondary | ICD-10-CM | POA: Insufficient documentation

## 2020-10-15 DIAGNOSIS — J4489 Other specified chronic obstructive pulmonary disease: Secondary | ICD-10-CM | POA: Diagnosis present

## 2020-10-15 DIAGNOSIS — M069 Rheumatoid arthritis, unspecified: Secondary | ICD-10-CM | POA: Diagnosis present

## 2020-10-15 DIAGNOSIS — R0989 Other specified symptoms and signs involving the circulatory and respiratory systems: Secondary | ICD-10-CM

## 2020-10-15 DIAGNOSIS — J449 Chronic obstructive pulmonary disease, unspecified: Secondary | ICD-10-CM | POA: Diagnosis present

## 2020-10-15 DIAGNOSIS — I152 Hypertension secondary to endocrine disorders: Secondary | ICD-10-CM | POA: Diagnosis present

## 2020-10-15 HISTORY — DX: Essential (primary) hypertension: I10

## 2020-10-15 HISTORY — DX: Rheumatoid arthritis, unspecified: M06.9

## 2020-10-15 HISTORY — DX: Other specified anxiety disorders: F41.8

## 2020-10-15 HISTORY — DX: Tobacco use: Z72.0

## 2020-10-15 HISTORY — DX: Gastro-esophageal reflux disease without esophagitis: K21.9

## 2020-10-15 LAB — URINALYSIS, COMPLETE (UACMP) WITH MICROSCOPIC
Bacteria, UA: NONE SEEN
Bilirubin Urine: NEGATIVE
Glucose, UA: NEGATIVE mg/dL
Hgb urine dipstick: NEGATIVE
Ketones, ur: NEGATIVE mg/dL
Leukocytes,Ua: NEGATIVE
Nitrite: NEGATIVE
Protein, ur: NEGATIVE mg/dL
Specific Gravity, Urine: 1.045 — ABNORMAL HIGH (ref 1.005–1.030)
Squamous Epithelial / HPF: NONE SEEN (ref 0–5)
pH: 5 (ref 5.0–8.0)

## 2020-10-15 LAB — RESP PANEL BY RT-PCR (FLU A&B, COVID) ARPGX2
Influenza A by PCR: NEGATIVE
Influenza B by PCR: NEGATIVE
SARS Coronavirus 2 by RT PCR: NEGATIVE

## 2020-10-15 LAB — PROTIME-INR
INR: 1 (ref 0.8–1.2)
Prothrombin Time: 12.9 seconds (ref 11.4–15.2)

## 2020-10-15 LAB — APTT: aPTT: 27 seconds (ref 24–36)

## 2020-10-15 LAB — BRAIN NATRIURETIC PEPTIDE: B Natriuretic Peptide: 10.5 pg/mL (ref 0.0–100.0)

## 2020-10-15 MED ORDER — IOHEXOL 9 MG/ML PO SOLN
500.0000 mL | Freq: Once | ORAL | Status: AC | PRN
  Administered 2020-10-15: 500 mL via ORAL

## 2020-10-15 MED ORDER — HEPARIN SODIUM (PORCINE) 5000 UNIT/ML IJ SOLN
5000.0000 [IU] | Freq: Three times a day (TID) | INTRAMUSCULAR | Status: DC
  Administered 2020-10-15 – 2020-10-16 (×3): 5000 [IU] via SUBCUTANEOUS
  Filled 2020-10-15 (×3): qty 1

## 2020-10-15 MED ORDER — ALBUTEROL SULFATE HFA 108 (90 BASE) MCG/ACT IN AERS: 2.0000 | INHALATION_SPRAY | RESPIRATORY_TRACT | Status: DC | PRN

## 2020-10-15 MED ORDER — RISPERIDONE 3 MG PO TABS
4.0000 mg | ORAL_TABLET | Freq: Every day | ORAL | Status: DC
  Administered 2020-10-15 – 2020-10-21 (×7): 4 mg via ORAL
  Filled 2020-10-15 (×6): qty 1
  Filled 2020-10-15: qty 4

## 2020-10-15 MED ORDER — DM-GUAIFENESIN ER 30-600 MG PO TB12: 1.0000 | ORAL_TABLET | Freq: Two times a day (BID) | ORAL | Status: DC | PRN

## 2020-10-15 MED ORDER — OXYCODONE-ACETAMINOPHEN 5-325 MG PO TABS
1.0000 | ORAL_TABLET | ORAL | Status: DC | PRN
  Administered 2020-10-15 (×2): 1 via ORAL
  Filled 2020-10-15 (×2): qty 1

## 2020-10-15 MED ORDER — FAMOTIDINE 20 MG PO TABS
20.0000 mg | ORAL_TABLET | Freq: Two times a day (BID) | ORAL | Status: DC
  Administered 2020-10-15 – 2020-10-21 (×12): 20 mg via ORAL
  Filled 2020-10-15 (×12): qty 1

## 2020-10-15 MED ORDER — SODIUM CHLORIDE 0.9 % IV SOLN: INTRAVENOUS | Status: DC

## 2020-10-15 MED ORDER — ONDANSETRON HCL 4 MG/2ML IJ SOLN: 4.0000 mg | Freq: Three times a day (TID) | INTRAMUSCULAR | Status: DC | PRN

## 2020-10-15 MED ORDER — DULOXETINE HCL 30 MG PO CPEP
60.0000 mg | ORAL_CAPSULE | Freq: Every day | ORAL | Status: DC
  Administered 2020-10-15 – 2020-10-21 (×7): 60 mg via ORAL
  Filled 2020-10-15: qty 2
  Filled 2020-10-15: qty 1
  Filled 2020-10-15 (×5): qty 2

## 2020-10-15 MED ORDER — AZITHROMYCIN 500 MG PO TABS
250.0000 mg | ORAL_TABLET | Freq: Every day | ORAL | Status: AC
  Administered 2020-10-16 – 2020-10-19 (×4): 250 mg via ORAL
  Filled 2020-10-15 (×4): qty 1

## 2020-10-15 MED ORDER — SODIUM CHLORIDE 0.9 % IV BOLUS
500.0000 mL | Freq: Once | INTRAVENOUS | Status: AC
  Administered 2020-10-15: 500 mL via INTRAVENOUS

## 2020-10-15 MED ORDER — ONDANSETRON HCL 4 MG/2ML IJ SOLN
4.0000 mg | Freq: Once | INTRAMUSCULAR | Status: AC
  Administered 2020-10-15: 4 mg via INTRAVENOUS
  Filled 2020-10-15: qty 2

## 2020-10-15 MED ORDER — MORPHINE SULFATE (PF) 2 MG/ML IV SOLN
2.0000 mg | INTRAVENOUS | Status: DC | PRN
Start: 2020-10-15 — End: 2020-10-16

## 2020-10-15 MED ORDER — IOHEXOL 350 MG/ML SOLN
100.0000 mL | Freq: Once | INTRAVENOUS | Status: AC | PRN
  Administered 2020-10-15: 100 mL via INTRAVENOUS

## 2020-10-15 MED ORDER — ACETAMINOPHEN 325 MG PO TABS: 650.0000 mg | ORAL_TABLET | Freq: Four times a day (QID) | ORAL | Status: DC | PRN

## 2020-10-15 MED ORDER — IPRATROPIUM-ALBUTEROL 0.5-2.5 (3) MG/3ML IN SOLN
3.0000 mL | RESPIRATORY_TRACT | Status: DC
  Administered 2020-10-15 – 2020-10-16 (×7): 3 mL via RESPIRATORY_TRACT
  Filled 2020-10-15 (×7): qty 3

## 2020-10-15 MED ORDER — NICOTINE 21 MG/24HR TD PT24
21.0000 mg | MEDICATED_PATCH | Freq: Every day | TRANSDERMAL | Status: DC
  Administered 2020-10-15 – 2020-10-21 (×7): 21 mg via TRANSDERMAL
  Filled 2020-10-15 (×7): qty 1

## 2020-10-15 MED ORDER — AZITHROMYCIN 500 MG PO TABS
500.0000 mg | ORAL_TABLET | Freq: Every day | ORAL | Status: AC
  Administered 2020-10-15: 500 mg via ORAL
  Filled 2020-10-15: qty 1

## 2020-10-15 MED ORDER — HYDRALAZINE HCL 20 MG/ML IJ SOLN: 5.0000 mg | INTRAMUSCULAR | Status: DC | PRN

## 2020-10-15 MED ORDER — METHYLPREDNISOLONE SODIUM SUCC 125 MG IJ SOLR
60.0000 mg | Freq: Two times a day (BID) | INTRAMUSCULAR | Status: DC
  Administered 2020-10-15 – 2020-10-16 (×3): 60 mg via INTRAVENOUS
  Filled 2020-10-15 (×3): qty 2

## 2020-10-15 MED ORDER — HYDROXYZINE HCL 25 MG PO TABS
25.0000 mg | ORAL_TABLET | Freq: Three times a day (TID) | ORAL | Status: DC | PRN
  Filled 2020-10-15: qty 1

## 2020-10-15 MED ORDER — ALBUTEROL SULFATE (2.5 MG/3ML) 0.083% IN NEBU: 2.5000 mg | INHALATION_SOLUTION | RESPIRATORY_TRACT | Status: DC | PRN

## 2020-10-15 MED ORDER — MORPHINE SULFATE (PF) 4 MG/ML IV SOLN
4.0000 mg | Freq: Once | INTRAVENOUS | Status: AC
  Administered 2020-10-15: 4 mg via INTRAVENOUS
  Filled 2020-10-15: qty 1

## 2020-10-15 NOTE — Consult Note (Signed)
UROLOGY BRIEF CONSULT NOTE  Extremely comorbid 50 year old male admitted with abdominal pain.  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  On chart review, he has been followed extensively by [facility] urology and endocrinology for these lesions, and has undergone extensive metabolic work-up and serial imaging, and discussed in tumor board there, with plan for watchful waiting/active surveillance.  Per [facility] note below:  "12/2019 CT Scan abdomen:4.1 cm right adrenal adenoma demonstrates 82% absolute washout, highly suggestive of adrenal adenoma. A 4.5 cm left adrenal lesion demonstrates 90% washout, consistent with a benign adrenal adenoma. Stable size of bilateral adrenal lesions. Redemonstrated lesion in the inferior pole right kidney 5.3 cm with slight interval increase.   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 Urologist noted that Bosniak 2 cyst is a very low risk for being a cancer and generally guidelines dont recommend continuing to follow them. However, given the growth and his young age, as well as there may be some small calcifications, they will re-image and see him again in about 6 months."   Recommend continued outpatient follow-up with [facility] endocrinology and urology.  These lesions would be extremely unlikely to be related to his abdominal pain and recommend evaluating for other causes.  Legrand Rams, MD 10/15/2020

## 2020-10-15 NOTE — H&P (Addendum)
History and Physical    Raymond Hood TMH:[id number] DOB: 05/25/1970 DOA: 10/15/2020  Referring MD/NP/PA:   PCP: Gwenlyn Perking, MD   Patient coming from:  The patient is homeless     Chief Complaint: abdominal pain  HPI: Raymond Hood is a 50 y.o. male with medical history significant of HTN, GERD, depression with anxiety, tobacco abuse, COPD, rheumatoid arthritis on Humira, schizoaffective schizophrenia, homeless, morbid obesity, who presents with abdominal pain.  Patient states that his abdominal pain has been going on for 2 days, which is located in the central abdomen, twisting stabbing-like pain, moderate to severe, radiating to the back.  He reports diarrhea which has been going on for 4 days, approximately 3 times of watery diarrhea each day.  No nausea or vomiting.  Patient has chronic shortness of breath and a cough due to COPD, with a little mucus production.  Denies chest pain. No fever or chills.  He states that sometimes he has a dysuria, denies burning on urination or increased urinary frequency.  No hematuria.   ED Course: pt was found to have lipase 36, WBC 9.4, negative COVID PCR, electrolytes renal function okay, temperature 97.4, blood pressure 120/69, heart rate 100, RR 20, oxygen saturation 93% on room air (currently on 1-2 L nasal cannula oxygen with 99% saturation).  Chest x-ray negative.  CT scan showed right renal mass and bilateral adrenal gland enlargement.  Patient is admitted to MedSurg bed as inpatient. Dr. Richardo Hanks of urology is consulted.  CT of abdomen/pelvis: 1. Solid right renal mass measuring up to 3.4 cm diameter and arising exophytically from the lower pole region is Renal Cell Carcinoma until proven otherwise. Recommend Urology consultation.   2. Bilateral adrenal gland enlargement, indeterminate for metastatic disease in the setting of #1, although bilateral benign adrenal adenoma is possible. And no other metastatic disease is identified in the  abdomen or pelvis.   3. Superimposed bilateral nephrolithiasis. But no obstructive uropathy. Hepatic steatosis. Normal appendix. Diverticulosis of the large bowel without active inflammation. Normal appendix.    Review of Systems:   General: no fevers, chills, no body weight gain, fatigue HEENT: no blurry vision, hearing changes or sore throat Respiratory: has dyspnea, coughing, wheezing CV: no chest pain, no palpitations GI: no nausea, vomiting, has abdominal pain, diarrhea, no constipation GU: has dysuria, no burning on urination, increased urinary frequency, hematuria  Ext: no leg edema Neuro: no unilateral weakness, numbness, or tingling, no vision change or hearing loss Skin: no rash, no skin tear. MSK: No muscle spasm, no deformity, no limitation of range of movement in spin Heme: No easy bruising.  Travel history: No recent long distant travel.  Allergy: No Known Allergies  Past Medical History:  Diagnosis Date   Depression with anxiety    GERD (gastroesophageal reflux disease)    HTN (hypertension)    Rheumatoid arthritis (HCC)    Tobacco abuse     Past Surgical History:  Procedure Laterality Date   left index finger surgery Left     Social History:  reports that he has been smoking cigarettes. He has been smoking an average of 1 pack per day. He has never used smokeless tobacco. He reports that he does not currently use alcohol. He reports that he does not use drugs.  Family History:  Family History  Problem Relation Age of Onset   Schizophrenia Mother    Diabetes Mellitus II Father      Prior to Admission medications   Medication Sig  Start Date End Date Taking? Authorizing Provider  Adalimumab 40 MG/0.4ML PSKT Inject 40 mg into the skin every 14 (fourteen) days.    [provider]  amLODipine (NORVASC) 5 MG tablet Take 5 mg by mouth daily.    [provider]  Cholecalciferol 25 MCG (1000 UT) capsule Take 3,000 Units by mouth daily.     [provider]  DULoxetine (CYMBALTA) 60 MG capsule Take 60 mg by mouth daily.    [provider]  famotidine (PEPCID) 20 MG tablet Take 20 mg by mouth 2 (two) times daily.    [provider]  Fluticasone-Umeclidin-Vilant (TRELEGY ELLIPTA) 100-62.5-25 MCG/INH AEPB Inhale 1 puff into the lungs daily.    [provider]  hydrOXYzine (ATARAX/VISTARIL) 25 MG tablet Take 25 mg by mouth 3 (three) times daily as needed for anxiety or sleep.    [provider]  Multiple Vitamin (MULTI-VITAMINS) TABS Take 1 tablet by mouth daily.    [provider]  risperidone (RISPERDAL) 4 MG tablet Take 4 mg by mouth daily.    [provider]  tamsulosin (FLOMAX) 0.4 MG CAPS capsule Take 0.4 mg by mouth daily.    [provider]    Physical Exam: Vitals:   10/15/20 0430 10/15/20 0500 10/15/20 0600 10/15/20 0630  BP: 118/76 (!) 104/58 115/67 120/69  Pulse: 99 100 100 100  Resp: 18 17 16 15   Temp:      TempSrc:      SpO2: 96% 99% 93% 97%  Weight:      Height:       General: Not in acute distress HEENT:       Eyes: PERRL, EOMI, no scleral icterus.       ENT: No discharge from the ears and nose, no pharynx injection, no tonsillar enlargement.        Neck: No JVD, no bruit, no mass felt. Heme: No neck lymph node enlargement. Cardiac: S1/S2, RRR, No murmurs, No gallops or rubs. Respiratory: Has wheezing bilaterally GI: Soft, nondistended, has tenderness in central abdomen, no rebound pain, no organomegaly, BS present. GU: No hematuria Ext: No pitting leg edema bilaterally. 1+DP/PT pulse bilaterally. Musculoskeletal: No joint deformities, No joint redness or warmth, no limitation of ROM in spin. Skin: No rashes.  Neuro: Alert, oriented X3, cranial nerves II-XII grossly intact, moves all extremities normally.  Psych: Patient is not psychotic, no suicidal or hemocidal ideation.  Labs on Admission: I have personally reviewed following labs  and imaging studies  CBC: Recent Labs  Lab 10/14/20 2014  WBC 9.4  HGB 16.4  HCT 48.7  MCV 91.2  PLT 191   Basic Metabolic Panel: Recent Labs  Lab 10/14/20 2050  NA 137  K 3.9  CL 99  CO2 28  GLUCOSE 148*  BUN 16  CREATININE 1.03  CALCIUM 8.4*   GFR: Estimated Creatinine Clearance: 135.7 mL/min (by C-G formula based on SCr of 1.03 mg/dL). Liver Function Tests: Recent Labs  Lab 10/14/20 2050  AST 19  ALT 23  ALKPHOS 76  BILITOT 0.6  PROT 7.1  ALBUMIN 3.4*   Recent Labs  Lab 10/14/20 2050  LIPASE 36   No results for input(s): AMMONIA in the last 168 hours. Coagulation Profile: No results for input(s): INR, PROTIME in the last 168 hours. Cardiac Enzymes: No results for input(s): CKTOTAL, CKMB, CKMBINDEX, TROPONINI in the last 168 hours. BNP (last 3 results) No results for input(s): PROBNP in the last 8760 hours. HbA1C: No results for  input(s): HGBA1C in the last 72 hours. CBG: No results for input(s): GLUCAP in the last 168 hours. Lipid Profile: No results for input(s): CHOL, HDL, LDLCALC, TRIG, CHOLHDL, LDLDIRECT in the last 72 hours. Thyroid Function Tests: No results for input(s): TSH, T4TOTAL, FREET4, T3FREE, THYROIDAB in the last 72 hours. Anemia Panel: No results for input(s): VITAMINB12, FOLATE, FERRITIN, TIBC, IRON, RETICCTPCT in the last 72 hours. Urine analysis: No results found for: COLORURINE, APPEARANCEUR, LABSPEC, PHURINE, GLUCOSEU, HGBUR, BILIRUBINUR, KETONESUR, PROTEINUR, UROBILINOGEN, NITRITE, LEUKOCYTESUR Sepsis Labs: (procalcitonin:4,lacticidven:4) ) Recent Results (from the past 240 hour(s))  Resp Panel by RT-PCR (Flu A&B, Covid) Nasopharyngeal Swab     Status: None   Collection Time: 10/15/20  4:39 AM   Specimen: Nasopharyngeal Swab; Nasopharyngeal(NP) swabs in vial transport medium  Result Value Ref Range Status   SARS Coronavirus 2 by RT PCR NEGATIVE NEGATIVE Final    Comment: (NOTE) SARS-CoV-2 target nucleic acids  are NOT DETECTED.  The SARS-CoV-2 RNA is generally detectable in upper respiratory specimens during the acute phase of infection. The lowest concentration of SARS-CoV-2 viral copies this assay can detect is 138 copies/mL. A negative result does not preclude SARS-Cov-2 infection and should not be used as the sole basis for treatment or other patient management decisions. A negative result may occur with  improper specimen collection/handling, submission of specimen other than nasopharyngeal swab, presence of viral mutation(s) within the areas targeted by this assay, and inadequate number of viral copies(<138 copies/mL). A negative result must be combined with clinical observations, patient history, and epidemiological information. The expected result is Negative.  Fact Sheet for Patients:  BloggerCourse.com  Fact Sheet for Healthcare Providers:  SeriousBroker.it  This test is no t yet approved or cleared by the [place] FDA and  has been authorized for detection and/or diagnosis of SARS-CoV-2 by FDA under an Emergency Use Authorization (EUA). This EUA will remain  in effect (meaning this test can be used) for the duration of the COVID-19 declaration under Section 564(b)(1) of the Act, 21 U.S.C.section 360bbb-3(b)(1), unless the authorization is terminated  or revoked sooner.       Influenza A by PCR NEGATIVE NEGATIVE Final   Influenza B by PCR NEGATIVE NEGATIVE Final    Comment: (NOTE) The Xpert Xpress SARS-CoV-2/FLU/RSV plus assay is intended as an aid in the diagnosis of influenza from Nasopharyngeal swab specimens and should not be used as a sole basis for treatment. Nasal washings and aspirates are unacceptable for Xpert Xpress SARS-CoV-2/FLU/RSV testing.  Fact Sheet for Patients: BloggerCourse.com  Fact Sheet for Healthcare Providers: SeriousBroker.it  This test is  not yet approved or cleared by the [place] FDA and has been authorized for detection and/or diagnosis of SARS-CoV-2 by FDA under an Emergency Use Authorization (EUA). This EUA will remain in effect (meaning this test can be used) for the duration of the COVID-19 declaration under Section 564(b)(1) of the Act, 21 U.S.C. section 360bbb-3(b)(1), unless the authorization is terminated or revoked.  Performed at [facility], [address], [place], [place] [address]      Radiological Exams on Admission: CT Abdomen Pelvis W Contrast  Result Date: 10/15/2020 CLINICAL DATA:  50 year old male with right lower quadrant abdominal pain, umbilical pain beginning 2 days ago. Diarrhea. EXAM: CT ABDOMEN AND PELVIS WITH CONTRAST TECHNIQUE: Multidetector CT imaging of the abdomen and pelvis was performed using the standard protocol following bolus administration of intravenous contrast. CONTRAST:  OMNIPAQUE IOHEXOL 350 MG/ML SOLN COMPARISON:  None. FINDINGS: Lower  chest: Cardiac size at the upper limits of normal. Mild atelectasis and crowding of lung markings. No pericardial or pleural effusion. Hepatobiliary: Hepatic steatosis. Fatty sparing near the gallbladder fossa. Negative gallbladder. No bile duct enlargement. Pancreas: Negative. Spleen: Negative. Adrenals/Urinary Tract: Lobular bilateral adrenal gland enlargement with low to intermediate density internally is indeterminate but favored due to benign adenoma. The left adrenal gland measures up to 4.2 cm. The right measures up to 3.8 cm. There is a solid renal mass arising exophytically from the right lower pole best seen on coronal image 68. This measures 3-3.5 cm diameter. There are several superimposed simple fluid density benign exophytic right renal cysts, the largest on series 2, image 44. Superimposed nephrolithiasis also. But no right hydronephrosis. No right hydroureter. Outside of the right renal mass the kidneys enhance and  excrete contrast symmetrically. Left nephrolithiasis and small benign appearing left renal cysts. No left hydronephrosis. Negative left ureter. Unremarkable bladder. Stomach/Bowel: Intermittent diverticulosis in the large bowel, most pronounced in the sigmoid colon. No large bowel inflammation identified, and the appendix remains normal on coronal image 50. Small volume of oral contrast in the small bowel has not yet reached the ileocecal valve. But terminal ileum and small bowel loops appear unremarkable. No dilated bowel. Stomach and duodenum are within normal limits. No free air. No free fluid or mesenteric inflammation identified. Vascular/Lymphatic: Major vascular structures in the abdomen and pelvis appear to be patent. Mild Aortoiliac calcified atherosclerosis. No lymphadenopathy identified. Reproductive: Negative. Other: No pelvic free fluid. Musculoskeletal: Degenerative changes in the spine and at both SI joints. No acute osseous abnormality identified. IMPRESSION: 1. Solid right renal mass measuring up to 3.4 cm diameter and arising exophytically from the lower pole region is Renal Cell Carcinoma until proven otherwise. Recommend Urology consultation. 2. Bilateral adrenal gland enlargement, indeterminate for metastatic disease in the setting of #1, although bilateral benign adrenal adenoma is possible. And no other metastatic disease is identified in the abdomen or pelvis. 3. Superimposed bilateral nephrolithiasis. But no obstructive uropathy. Hepatic steatosis. Normal appendix. Diverticulosis of the large bowel without active inflammation. Normal appendix. Electronically Signed   By: Odessa Fleming M.D.   On: 10/15/2020 04:24   DG Chest Port 1 View  Result Date: 10/15/2020 CLINICAL DATA:  50 year old male with abdominal pain for 2 days. Hypoxia. EXAM: PORTABLE CHEST 1 VIEW COMPARISON:  CT Abdomen and Pelvis 0401 hours today. FINDINGS: Portable AP upright view at 0443 hours. Somewhat low lung volumes with  mild crowding of lung markings as seen on the CT earlier today. Normal cardiac size and mediastinal contours. Visualized tracheal air column is within normal limits. No pneumothorax, pleural effusion, pulmonary edema, or confluent pulmonary opacity. No acute osseous abnormality identified. IMPRESSION: Low lung volumes with mild atelectasis. Electronically Signed   By: Odessa Fleming M.D.   On: 10/15/2020 05:00     EKG:  Not done in ED, will get one.   Assessment/Plan Principal Problem:   Renal mass, right Active Problems:   Schizo affective schizophrenia (HCC)   Depression with anxiety   Tobacco abuse   HTN (hypertension)   COPD (chronic obstructive pulmonary disease) (HCC)   Diarrhea   Morbid obesity with BMI of 50.0-59.9, adult (HCC)   Homeless   Rheumatoid arthritis (HCC)   Renal mass, right: CT scan showed a solid right renal mass measuring up to 3.4 cm diameter and arising exophytically from the lower pole region, highly suspect renal cell carcinoma. CT scan also showed bilateral adrenal gland  enlargement, aAnd no other metastatic disease is identified in the abdomen or pelvis. Dr. Richardo Hanks of urology is consulted.   -will admit to med-surg bed as inpt -prn Percocet and morphine for pain, Zofran for nausea -f/u urologist recommendations  Addendum: per Dr. Richardo Hanks, pt is followed up closely at [facility] for surveillance of these lesions and they have been stable.  Renal mass is not thought to be malignancy.  His abdominal pain is likely unrelated. He recommended continuing follow-up with his [facility] urologist and endocrinologist.   Abdominal pain and diarrhea: Etiology for abdominal pain is not clear, may be due to enteritis and diarrhea.  Lipase normal 36. -Check C. difficile and GI pathogen panel  Hx of schizo affective schizophrenia and depression with anxiety: -Continue home Cymbalta, hydroxyzine, risperidone  Tobacco abuse -Nicotine patch  HTN (hypertension): pt is not taking his  amlodipine currenlty, bp is 120/69 -IV hydralazine as needed  COPD (chronic obstructive pulmonary disease): Patient has shortness breath, cough with wheezing on auscultation, no oxygen desaturation, indicating mild COPD exacerbation -Bronchodilators -Solu-Medrol 60 mg twice daily -Z-Pak -Sputum culture   Morbid obesity with BMI of 50.0-59.9, adult (HCC) -Diet and exercise.   -Encouraged to lose weight.   Homeless -consult transition care team  Rheumatoid arthritis [facility]): Patient is getting Humira injection every 14 days from rheumatologist, last dose was 2 days ago per pt. -f/u with rheumatologist         DVT ppx: SQ Heparin    Code Status: Full code Family Communication:  Yes, patient's friend by phone. Disposition Plan:  Anticipate discharge back to previous environment Consults called:  Dr. Richardo Hanks of urology Admission status and Level of care: Med-Surg:     as inpt         Status is: Inpatient  Remains inpatient appropriate because:Inpatient level of care appropriate due to severity of illness  Dispo: The patient is from: homeless              Anticipated d/c is to: to be determined              Patient currently is not medically stable to d/c.   Difficult to place patient No             Date of Service 10/15/2020    Lorretta Harp [facility]   If 7PM-7AM, please contact night-coverage [email or web address] 10/15/2020, 8:14 AM

## 2020-10-15 NOTE — ED Notes (Signed)
Secure chat message sent to new accepting RN Priscella Mann for transport to 108 at this time. Vitals updated accordingly

## 2020-10-15 NOTE — ED Notes (Signed)
He has not voided and denies the urge to void, urinal at bedside for urine collection.

## 2020-10-15 NOTE — ED Provider Notes (Signed)
[facility] Emergency Department Provider Note   ____________________________________________   Event Date/Time   First MD Initiated Contact with Patient 10/15/20 0207     (approximate)  I have reviewed the triage vital signs and the nursing notes.   HISTORY  Chief Complaint Abdominal Pain    HPI Raymond Hood is a 50 y.o. male brought to the ED via EMS from home with a chief complaint of abdominal pain.  Patient reports a 1 day history of suprapubic abdominal pain.  Reports diarrhea to triage nurse but denies to me.  Denies associated fever, chills, chest pain, shortness of breath, nausea, vomiting, dysuria or diarrhea.  Denies recent travel or antibiotic use      History reviewed. No pertinent past medical history.  Patient Active Problem List   Diagnosis Date Noted   Renal mass 10/15/2020   MDD (major depressive disorder), recurrent episode (HCC) 09/28/2020   Anxiety 09/28/2020   Schizo affective schizophrenia (HCC) 09/28/2020    History reviewed. No pertinent surgical history.  Prior to Admission medications   Medication Sig Start Date End Date Taking? Authorizing Provider  Adalimumab 40 MG/0.4ML PSKT Inject 40 mg into the skin every 14 (fourteen) days.    [provider]  amLODipine (NORVASC) 5 MG tablet Take 5 mg by mouth daily.    [provider]  Cholecalciferol 25 MCG (1000 UT) capsule Take 3,000 Units by mouth daily.    [provider]  DULoxetine (CYMBALTA) 60 MG capsule Take 60 mg by mouth daily.    [provider]  famotidine (PEPCID) 20 MG tablet Take 20 mg by mouth 2 (two) times daily.    [provider]  Fluticasone-Umeclidin-Vilant (TRELEGY ELLIPTA) 100-62.5-25 MCG/INH AEPB Inhale 1 puff into the lungs daily.    [provider]  hydrOXYzine (ATARAX/VISTARIL) 25 MG tablet Take 25 mg by mouth 3 (three) times daily as needed for anxiety or sleep.    [provider]   Multiple Vitamin (MULTI-VITAMINS) TABS Take 1 tablet by mouth daily.    [provider]  risperidone (RISPERDAL) 4 MG tablet Take 4 mg by mouth daily.    [provider]  tamsulosin (FLOMAX) 0.4 MG CAPS capsule Take 0.4 mg by mouth daily.    [provider]    Allergies Patient has no known allergies.  History reviewed. No pertinent family history.  Social History Social History   Tobacco Use   Smoking status: Every Day    Packs/day: 1.00    Types: Cigarettes   Smokeless tobacco: Never  Substance Use Topics   Alcohol use: Not Currently    Review of Systems  Constitutional: No fever/chills Eyes: No visual changes. ENT: No sore throat. Cardiovascular: Denies chest pain. Respiratory: Denies shortness of breath. Gastrointestinal: Positive for abdominal pain.  No nausea, no vomiting.  No diarrhea.  No constipation. Genitourinary: Negative for dysuria. Musculoskeletal: Negative for back pain. Skin: Negative for rash. Neurological: Negative for headaches, focal weakness or numbness.   ____________________________________________   PHYSICAL EXAM:  VITAL SIGNS: ED Triage Vitals  Enc Vitals Group     BP 10/14/20 2009 122/76     Pulse Rate 10/14/20 2009 100     Resp 10/14/20 2009 20     Temp 10/14/20 2009 98.4 F (36.9 C)     Temp Source 10/14/20 2009 Oral     SpO2 10/14/20 2009 94 %     Weight 10/14/20 2012 (!) 370 lb (167.8 kg)     Height  10/14/20 2012 5\' 8"  (1.727 m)     Head Circumference --      Peak Flow --      Pain Score 10/14/20 2011 8     Pain Loc --      Pain Edu? --      Excl. in GC? --     Constitutional: Awakened from sleep.  Alert and oriented. Well appearing and in no acute distress. Eyes: Conjunctivae are normal. PERRL. EOMI. Head: Atraumatic. Nose: No congestion/rhinnorhea. Mouth/Throat: Mucous membranes are moist.   Neck: No stridor.   Cardiovascular: Normal rate, regular rhythm. Grossly normal heart sounds.  Good  peripheral circulation. Respiratory: Normal respiratory effort.  No retractions. Lungs CTAB. Gastrointestinal: Obese.  Soft and mildly tender to palpation lower abdomen without rebound or guarding. No distention. No abdominal bruits. No CVA tenderness. Musculoskeletal: No lower extremity tenderness nor edema.  No joint effusions. Neurologic:  Normal speech and language. No gross focal neurologic deficits are appreciated. No gait instability. Skin:  Skin is warm, dry and intact. No rash noted. Psychiatric: Mood and affect are normal. Speech and behavior are normal.  ____________________________________________   LABS (all labs ordered are listed, but only abnormal results are displayed)  Labs Reviewed  COMPREHENSIVE METABOLIC PANEL - Abnormal; Notable for the following components:      Result Value   Glucose, Bld 148 (*)    Calcium 8.4 (*)    Albumin 3.4 (*)    All other components within normal limits  RESP PANEL BY RT-PCR (FLU A&B, COVID) ARPGX2  CBC  LIPASE, BLOOD  URINALYSIS, COMPLETE (UACMP) WITH MICROSCOPIC   ____________________________________________  EKG  None ____________________________________________  RADIOLOGY I, Bushra Denman J, personally viewed and evaluated these images (plain radiographs) as part of my medical decision making, as well as reviewing the written report by the radiologist.  ED MD interpretation: No acute cardiopulmonary process; likely renal cell carcinoma, metastatic  Official radiology report(s): CT Abdomen Pelvis W Contrast  Result Date: 10/15/2020 CLINICAL DATA:  50 year old male with right lower quadrant abdominal pain, umbilical pain beginning 2 days ago. Diarrhea. EXAM: CT ABDOMEN AND PELVIS WITH CONTRAST TECHNIQUE: Multidetector CT imaging of the abdomen and pelvis was performed using the standard protocol following bolus administration of intravenous contrast. CONTRAST:  57 OMNIPAQUE IOHEXOL 350 MG/ML SOLN COMPARISON:  None. FINDINGS:  Lower chest: Cardiac size at the upper limits of normal. Mild atelectasis and crowding of lung markings. No pericardial or pleural effusion. Hepatobiliary: Hepatic steatosis. Fatty sparing near the gallbladder fossa. Negative gallbladder. No bile duct enlargement. Pancreas: Negative. Spleen: Negative. Adrenals/Urinary Tract: Lobular bilateral adrenal gland enlargement with low to intermediate density internally is indeterminate but favored due to benign adenoma. The left adrenal gland measures up to 4.2 cm. The right measures up to 3.8 cm. There is a solid renal mass arising exophytically from the right lower pole best seen on coronal image 68. This measures 3-3.5 cm diameter. There are several superimposed simple fluid density benign exophytic right renal cysts, the largest on series 2, image 44. Superimposed nephrolithiasis also. But no right hydronephrosis. No right hydroureter. Outside of the right renal mass the kidneys enhance and excrete contrast symmetrically. Left nephrolithiasis and small benign appearing left renal cysts. No left hydronephrosis. Negative left ureter. Unremarkable bladder. Stomach/Bowel: Intermittent diverticulosis in the large bowel, most pronounced in the sigmoid colon. No large bowel inflammation identified, and the appendix remains normal on coronal image 50. Small volume of oral contrast in the small bowel has not yet reached  the ileocecal valve. But terminal ileum and small bowel loops appear unremarkable. No dilated bowel. Stomach and duodenum are within normal limits. No free air. No free fluid or mesenteric inflammation identified. Vascular/Lymphatic: Major vascular structures in the abdomen and pelvis appear to be patent. Mild Aortoiliac calcified atherosclerosis. No lymphadenopathy identified. Reproductive: Negative. Other: No pelvic free fluid. Musculoskeletal: Degenerative changes in the spine and at both SI joints. No acute osseous abnormality identified. IMPRESSION: 1. Solid  right renal mass measuring up to 3.4 cm diameter and arising exophytically from the lower pole region is Renal Cell Carcinoma until proven otherwise. Recommend Urology consultation. 2. Bilateral adrenal gland enlargement, indeterminate for metastatic disease in the setting of #1, although bilateral benign adrenal adenoma is possible. And no other metastatic disease is identified in the abdomen or pelvis. 3. Superimposed bilateral nephrolithiasis. But no obstructive uropathy. Hepatic steatosis. Normal appendix. Diverticulosis of the large bowel without active inflammation. Normal appendix. Electronically Signed   By: Odessa Fleming M.D.   On: 10/15/2020 04:24   DG Chest Port 1 View  Result Date: 10/15/2020 CLINICAL DATA:  50 year old male with abdominal pain for 2 days. Hypoxia. EXAM: PORTABLE CHEST 1 VIEW COMPARISON:  CT Abdomen and Pelvis 0401 hours today. FINDINGS: Portable AP upright view at 0443 hours. Somewhat low lung volumes with mild crowding of lung markings as seen on the CT earlier today. Normal cardiac size and mediastinal contours. Visualized tracheal air column is within normal limits. No pneumothorax, pleural effusion, pulmonary edema, or confluent pulmonary opacity. No acute osseous abnormality identified. IMPRESSION: Low lung volumes with mild atelectasis. Electronically Signed   By: Odessa Fleming M.D.   On: 10/15/2020 05:00    ____________________________________________   PROCEDURES  Procedure(s) performed (including Critical Care):  Procedures   ____________________________________________   INITIAL IMPRESSION / ASSESSMENT AND PLAN / ED COURSE  As part of my medical decision making, I reviewed the following data within the electronic MEDICAL RECORD NUMBER Nursing notes reviewed and incorporated, Labs reviewed, Old chart reviewed, Radiograph reviewed, and Notes from prior ED visits     50 year old male presenting with lower abdominal pain. Differential diagnosis includes, but is not limited  to, acute appendicitis, renal colic, testicular torsion, urinary tract infection/pyelonephritis, prostatitis,  epididymitis, diverticulitis, small bowel obstruction or ileus, colitis, abdominal aortic aneurysm, gastroenteritis, hernia, etc.   Laboratory results unremarkable.  Awaiting urine specimen.  Will administer IV analgesia and proceed with CT abdomen/pelvis to evaluate etiology of patient's pain.  Clinical Course as of 10/15/20 0646  Thu Oct 15, 2020  9417 Room air saturations 85%.  Likely combination of OSA plus analgesia.  Patient placed on nasal cannula oxygen with improvement.  Will obtain chest x-ray, COVID swab.  Will discuss with hospitalist services for admission. [JS]  [id number] Patient is homeless; has been sleeping on a park bench and since April 2022 [JS]    Clinical Course User Index [JS] Irean Hong, MD     ____________________________________________   FINAL CLINICAL IMPRESSION(S) / ED DIAGNOSES  Final diagnoses:  Lower abdominal pain  Renal mass  Hypoxia     ED Discharge Orders     None        Note:  This document was prepared using Dragon voice recognition software and may include unintentional dictation errors.    Irean Hong, MD 10/15/20 [phone]

## 2020-10-15 NOTE — ED Notes (Signed)
Patient still has not voided.

## 2020-10-16 LAB — CBC
HCT: 48.2 % (ref 39.0–52.0)
Hemoglobin: 15.1 g/dL (ref 13.0–17.0)
MCH: 28.3 pg (ref 26.0–34.0)
MCHC: 31.3 g/dL (ref 30.0–36.0)
MCV: 90.4 fL (ref 80.0–100.0)
Platelets: 207 10*3/uL (ref 150–400)
RBC: 5.33 MIL/uL (ref 4.22–5.81)
RDW: 13.9 % (ref 11.5–15.5)
WBC: 11.6 10*3/uL — ABNORMAL HIGH (ref 4.0–10.5)
nRBC: 0 % (ref 0.0–0.2)

## 2020-10-16 LAB — GLUCOSE, CAPILLARY
Glucose-Capillary: 294 mg/dL — ABNORMAL HIGH (ref 70–99)
Glucose-Capillary: 270 mg/dL — ABNORMAL HIGH (ref 70–99)
Glucose-Capillary: 317 mg/dL — ABNORMAL HIGH (ref 70–99)
Glucose-Capillary: 358 mg/dL — ABNORMAL HIGH (ref 70–99)

## 2020-10-16 LAB — HIV ANTIBODY (ROUTINE TESTING W REFLEX): HIV Screen 4th Generation wRfx: NONREACTIVE

## 2020-10-16 MED ORDER — ENOXAPARIN SODIUM 40 MG/0.4ML IJ SOSY: 40.0000 mg | PREFILLED_SYRINGE | INTRAMUSCULAR | Status: DC

## 2020-10-16 MED ORDER — TRAMADOL HCL 50 MG PO TABS
50.0000 mg | ORAL_TABLET | Freq: Four times a day (QID) | ORAL | Status: DC | PRN
  Administered 2020-10-16 – 2020-10-20 (×9): 50 mg via ORAL
  Filled 2020-10-16 (×9): qty 1

## 2020-10-16 MED ORDER — INSULIN ASPART 100 UNIT/ML IJ SOLN
0.0000 [IU] | Freq: Every day | INTRAMUSCULAR | Status: DC
Start: 2020-10-16 — End: 2020-10-17
  Administered 2020-10-16: 21:00:00 5 [IU] via SUBCUTANEOUS
  Filled 2020-10-16: qty 1

## 2020-10-16 MED ORDER — INSULIN ASPART 100 UNIT/ML IJ SOLN
0.0000 [IU] | Freq: Three times a day (TID) | INTRAMUSCULAR | Status: DC
  Administered 2020-10-16: 2 [IU] via SUBCUTANEOUS
  Administered 2020-10-16: 7 [IU] via SUBCUTANEOUS
  Administered 2020-10-17: 09:00:00 3 [IU] via SUBCUTANEOUS
  Administered 2020-10-17: 5 [IU] via SUBCUTANEOUS
  Filled 2020-10-16 (×5): qty 1

## 2020-10-16 MED ORDER — TAMSULOSIN HCL 0.4 MG PO CAPS
0.4000 mg | ORAL_CAPSULE | Freq: Every day | ORAL | Status: DC
  Administered 2020-10-16 – 2020-10-21 (×6): 0.4 mg via ORAL
  Filled 2020-10-16 (×6): qty 1

## 2020-10-16 MED ORDER — INSULIN ASPART 100 UNIT/ML IJ SOLN: 0.0000 [IU] | Freq: Three times a day (TID) | INTRAMUSCULAR | Status: DC

## 2020-10-16 MED ORDER — OXYCODONE HCL 5 MG PO TABS
5.0000 mg | ORAL_TABLET | ORAL | Status: DC | PRN
  Administered 2020-10-16: 5 mg via ORAL
  Filled 2020-10-16: qty 1

## 2020-10-16 MED ORDER — BUDESONIDE 0.25 MG/2ML IN SUSP
0.2500 mg | Freq: Two times a day (BID) | RESPIRATORY_TRACT | Status: DC
  Administered 2020-10-16 – 2020-10-19 (×6): 0.25 mg via RESPIRATORY_TRACT
  Filled 2020-10-16 (×6): qty 2

## 2020-10-16 MED ORDER — ACETAMINOPHEN 500 MG PO TABS
500.0000 mg | ORAL_TABLET | Freq: Four times a day (QID) | ORAL | Status: DC | PRN
Start: 2020-10-16 — End: 2020-10-21
  Administered 2020-10-16 – 2020-10-18 (×2): 500 mg via ORAL
  Filled 2020-10-16 (×3): qty 1

## 2020-10-16 MED ORDER — IPRATROPIUM-ALBUTEROL 0.5-2.5 (3) MG/3ML IN SOLN
3.0000 mL | Freq: Four times a day (QID) | RESPIRATORY_TRACT | Status: DC
  Administered 2020-10-16 – 2020-10-17 (×4): 3 mL via RESPIRATORY_TRACT
  Filled 2020-10-16 (×4): qty 3

## 2020-10-16 MED ORDER — ENOXAPARIN SODIUM 100 MG/ML IJ SOSY
0.5000 mg/kg | PREFILLED_SYRINGE | INTRAMUSCULAR | Status: DC
  Administered 2020-10-16 – 2020-10-20 (×5): 85 mg via SUBCUTANEOUS
  Filled 2020-10-16 (×6): qty 0.85

## 2020-10-16 MED ORDER — FUROSEMIDE 40 MG PO TABS
40.0000 mg | ORAL_TABLET | Freq: Every day | ORAL | Status: DC
  Administered 2020-10-16 – 2020-10-17 (×2): 40 mg via ORAL
  Filled 2020-10-16 (×2): qty 1

## 2020-10-16 NOTE — Progress Notes (Signed)
PHARMACIST - PHYSICIAN COMMUNICATION  CONCERNING:  Enoxaparin (Lovenox) for DVT Prophylaxis    RECOMMENDATION: Patient was prescribed enoxaprin 40mg  q24 hours for VTE prophylaxis.   Filed Weights   10/14/20 2012  Weight: (!) 167.8 kg (370 lb)    Body mass index is 56.26 kg/m.  Estimated Creatinine Clearance: 135.7 mL/min (by C-G formula based on SCr of 1.03 mg/dL).   Based on [facility] policy patient is candidate for enoxaparin 0.5mg /kg TBW SQ every 24 hours based on BMI being >30.  DESCRIPTION: Pharmacy has adjusted enoxaparin dose per [facility] policy.  Patient is now receiving enoxaparin 85mg  every 24hours   [facility], PharmD, BCPS Clinical Pharmacist 10/16/2020 9:13 AM

## 2020-10-16 NOTE — Progress Notes (Signed)
Raymond Hood  NID:[id number] DOB: [birth year] DOA: 10/15/2020 PCP: Gwenlyn Perking, MD    Brief Narrative:  50yo with a history of HTN, GERD, depression/anxiety, tobacco abuse, COPD, RA on Humira, schizoaffective disorder, morbid obesity, and homelessness who presented to the ED with 2 days of central twisting stabbing-like abdominal pain radiating into his back.  This was associated with 4 days of watery diarrhea.  In the ED CT abdomen/pelvis noted a right renal mass and bilateral adrenal gland enlargement (chronic issues followed at [facility]).  Significant Events:  10/6 admit via ED  Consultants:  Urology  Code Status: FULL CODE  Antimicrobials:  Azithromycin 10/6 >  DVT prophylaxis: Subcu heparin  Subjective: Afebrile.  Mildly tachycardic at 100 bpm.  Blood pressure stable.  Saturations 97% on 2 L nasal cannula.  Reports ongoing abdominal pain but cannot give significant description of character or location.  Reports that he is hungry.  No further diarrhea since admission.  Feels mildly short of breath.  Assessment & Plan:  Abdominal pain with diarrhea Idiopathic -lipase normal -COVID-negative -no clinical evidence to suggest acute infection and no significant risk factors for C. difficile colitis -diarrhea appears to have spontaneously resolved  Right renal mass A chronic issue that has been evaluated and is being followed at [facility] -no further investigation required this admission  Hyperglycemia No known prior diagnosis of DM -check A1c - SSI for now - stop steroid asap  Schizoaffective disorder Continue usual home medical therapies  Tobacco abuse Utilize nicotine patch - counseled on advisability of discontinuing smoking  HTN Admits that he has not been taking Norvasc -monitor trend  COPD Patient reports being chronically short of breath -exam at admission consistent with mild bronchospastic exacerbation -steroid being dosed -continue bronchodilators - wean steroids  asap  Morbid obesity - Body mass index is 56.26 kg/m.  RA Receives Humira injections every 14 days -followed by rheumatologist   Family Communication: No family present -patient states he has no family in the area Status is: Inpatient  Remains inpatient appropriate because:Inpatient level of care appropriate due to severity of illness  Dispo: The patient is from: Home              Anticipated d/c is to:  Unclear              Patient currently is not medically stable to d/c.   Difficult to place patient Yes   Objective: Blood pressure 130/77, pulse (!) 101, temperature 97.6 F (36.4 C), temperature source Oral, resp. rate 18, height 5\' 8"  (1.727 m), weight (!) 167.8 kg, SpO2 97 %. No intake or output data in the 24 hours ending 10/16/20 0857 Filed Weights   10/14/20 2012  Weight: (!) 167.8 kg    Examination: General: No acute respiratory distress -alert Lungs: Poor air movement diffusely with mild bibasilar crackles without active wheezing Cardiovascular: Regular rate and rhythm without murmur gallop or rub normal S1 and S2 Abdomen: Obese, soft, bowel sounds positive, no rebound, no appreciable mass Extremities: Trace edema bilateral lower extremities without cyanosis  CBC: Recent Labs  Lab 10/14/20 2014 10/16/20 0336  WBC 9.4 11.6*  HGB 16.4 15.1  HCT 48.7 48.2  MCV 91.2 90.4  PLT 191 207   Basic Metabolic Panel: Recent Labs  Lab 10/14/20 2050  NA 137  K 3.9  CL 99  CO2 28  GLUCOSE 148*  BUN 16  CREATININE 1.03  CALCIUM 8.4*   GFR: Estimated Creatinine Clearance: 135.7 mL/min (by C-G formula  based on SCr of 1.03 mg/dL).  Liver Function Tests: Recent Labs  Lab 10/14/20 2050  AST 19  ALT 23  ALKPHOS 76  BILITOT 0.6  PROT 7.1  ALBUMIN 3.4*   Recent Labs  Lab 10/14/20 2050  LIPASE 36    Coagulation Profile: Recent Labs  Lab 10/15/20 0824  INR 1.0    HbA1C: No results found for: HGBA1C  CBG: Recent Labs  Lab 10/16/20 0755   GLUCAP 294*    Recent Results (from the past 240 hour(s))  Resp Panel by RT-PCR (Flu A&B, Covid) Nasopharyngeal Swab     Status: None   Collection Time: 10/15/20  4:39 AM   Specimen: Nasopharyngeal Swab; Nasopharyngeal(NP) swabs in vial transport medium  Result Value Ref Range Status   SARS Coronavirus 2 by RT PCR NEGATIVE NEGATIVE Final    Comment: (NOTE) SARS-CoV-2 target nucleic acids are NOT DETECTED.  The SARS-CoV-2 RNA is generally detectable in upper respiratory specimens during the acute phase of infection. The lowest concentration of SARS-CoV-2 viral copies this assay can detect is 138 copies/mL. A negative result does not preclude SARS-Cov-2 infection and should not be used as the sole basis for treatment or other patient management decisions. A negative result may occur with  improper specimen collection/handling, submission of specimen other than nasopharyngeal swab, presence of viral mutation(s) within the areas targeted by this assay, and inadequate number of viral copies(<138 copies/mL). A negative result must be combined with clinical observations, patient history, and epidemiological information. The expected result is Negative.  Fact Sheet for Patients:  BloggerCourse.com  Fact Sheet for Healthcare Providers:  SeriousBroker.it  This test is no t yet approved or cleared by the [place] FDA and  has been authorized for detection and/or diagnosis of SARS-CoV-2 by FDA under an Emergency Use Authorization (EUA). This EUA will remain  in effect (meaning this test can be used) for the duration of the COVID-19 declaration under Section 564(b)(1) of the Act, 21 U.S.C.section 360bbb-3(b)(1), unless the authorization is terminated  or revoked sooner.       Influenza A by PCR NEGATIVE NEGATIVE Final   Influenza B by PCR NEGATIVE NEGATIVE Final    Comment: (NOTE) The Xpert Xpress SARS-CoV-2/FLU/RSV plus assay is  intended as an aid in the diagnosis of influenza from Nasopharyngeal swab specimens and should not be used as a sole basis for treatment. Nasal washings and aspirates are unacceptable for Xpert Xpress SARS-CoV-2/FLU/RSV testing.  Fact Sheet for Patients: BloggerCourse.com  Fact Sheet for Healthcare Providers: SeriousBroker.it  This test is not yet approved or cleared by the [place] FDA and has been authorized for detection and/or diagnosis of SARS-CoV-2 by FDA under an Emergency Use Authorization (EUA). This EUA will remain in effect (meaning this test can be used) for the duration of the COVID-19 declaration under Section 564(b)(1) of the Act, 21 U.S.C. section 360bbb-3(b)(1), unless the authorization is terminated or revoked.  Performed at [facility], [address] [place], [place] [address]      Scheduled Meds:  azithromycin  250 mg Oral Daily   DULoxetine  60 mg Oral Daily   famotidine  20 mg Oral BID   heparin  5,000 Units Subcutaneous Q8H   ipratropium-albuterol  3 mL Nebulization Q4H   methylPREDNISolone (SOLU-MEDROL) injection  60 mg Intravenous Q12H   nicotine  21 mg Transdermal Daily   risperidone  4 mg Oral Daily      LOS: 1 day   Lonia Blood, MD  [facility] Office  [phone] Pager - Text Page per Loretha Stapler  If 7PM-7AM, please contact night-coverage per Amion 10/16/2020, 8:57 AM

## 2020-10-16 NOTE — TOC Initial Note (Signed)
Transition of Care [facility]) - Initial/Assessment Note    Patient Details  Name: Raymond Hood MRN: [id number] Date of Birth: 1970/11/05  [facility] CM/SW Contact:    Allayne Butcher, RN Phone Number: 10/16/2020, 2:31 PM  Clinical Narrative:                 Patient admitted to the hospital with abd pain and renal mass.  Patient has been worked up at [facility] for the renal mass.  Patient sees providers at [facility] and is on charity care with them.  Patient is current with a PCP and gets his prescriptions from [facility] but he does have trouble getting his medications.  RNCM will refer over to Medication Management.  Patient is homeless and reports sleeping [place] the [place] in [place], he reports being tired of being homeless.  He has filed a disability claim and filled out a Medicaid application, his SSI hearing is on November 15th.  He is on the wait list for the homeless shelter.   Patient was at one time in [place] at the [facility] but he was kicked out when he got sick because he could not work and they said he needed a different facility.  A friend drove him back to [place], the friend is only able to help him intermittently.  Patient has been given resources by [facility], next follow up with them is Oct 7th.    Discharge plan will have to be trying to get him in the shelter.  RNCM did reach out to the [facility] to see if they could assist with his SSI and disability.  Expected Discharge Plan: Homeless Shelter Barriers to Discharge: Continued Medical Work up   Patient Goals and CMS Choice Patient states their goals for this hospitalization and ongoing recovery are:: Patient doesn't want to be homeless anymore      Expected Discharge Plan and Services Expected Discharge Plan: Homeless Shelter   Discharge Planning Services: Medication Assistance, CM Consult, Other - See comment   Living arrangements for the past 2 months: Homeless                 DME  Arranged: N/A DME Agency: NA       HH Arranged: NA HH Agency: NA        Prior Living Arrangements/Services Living arrangements for the past 2 months: Homeless   Patient language and need for interpreter reviewed:: Yes Do you feel safe going back to the place where you live?: Yes      Need for Family Participation in Patient Care: Yes (Comment) Care giver support system in place?: No (comment)   Criminal Activity/Legal Involvement Pertinent to Current Situation/Hospitalization: No - Comment as needed  Activities of Daily Living Home Assistive Devices/Equipment: None ADL Screening (condition at time of admission) Patient's cognitive ability adequate to safely complete daily activities?: Yes Is the patient deaf or have difficulty hearing?: No Does the patient have difficulty seeing, even when wearing glasses/contacts?: No Does the patient have difficulty concentrating, remembering, or making decisions?: No Patient able to express need for assistance with ADLs?: Yes Does the patient have difficulty dressing or bathing?: No Independently performs ADLs?: Yes (appropriate for developmental age) Does the patient have difficulty walking or climbing stairs?: Yes Weakness of Legs: Both Weakness of Arms/Hands: None  Permission Sought/Granted Permission sought to share information with : Case Manager, Other (comment) Permission granted to share information with : Yes, Verbal Permission Granted  Permission granted to share info w AGENCY: Medicaid, servant center        Emotional Assessment Appearance:: Appears stated age Attitude/Demeanor/Rapport: Engaged Affect (typically observed): Flat Orientation: : Oriented to Self, Oriented to Place, Oriented to  Time, Oriented to Situation Alcohol / Substance Use: Not Applicable Psych Involvement: No (comment)  Admission diagnosis:  Renal mass [N28.89] Hypoxia [R09.02] Lower abdominal pain [R10.30] Renal mass, right [N28.89] Patient  Active Problem List   Diagnosis Date Noted   Renal mass 10/15/2020   Depression with anxiety 10/15/2020   GERD (gastroesophageal reflux disease) 10/15/2020   Tobacco abuse 10/15/2020   HTN (hypertension) 10/15/2020   Renal mass, right 10/15/2020   COPD (chronic obstructive pulmonary disease) (HCC) 10/15/2020   Diarrhea 10/15/2020   Morbid obesity with BMI of 50.0-59.9, adult (HCC) 10/15/2020   Homeless 10/15/2020   Rheumatoid arthritis (HCC) 10/15/2020   Abdominal pain 10/15/2020   MDD (major depressive disorder), recurrent episode (HCC) 09/28/2020   Anxiety 09/28/2020   Schizo affective schizophrenia (HCC) 09/28/2020   PCP:  Gwenlyn Perking, MD Pharmacy:   [id number] [place], [place] - [address] [place] [address] Phone: [phone] Fax: [phone]     Social Determinants of Health (SDOH) Interventions    Readmission Risk Interventions No flowsheet data found.

## 2020-10-17 LAB — GLUCOSE, CAPILLARY
Glucose-Capillary: 217 mg/dL — ABNORMAL HIGH (ref 70–99)
Glucose-Capillary: 269 mg/dL — ABNORMAL HIGH (ref 70–99)
Glucose-Capillary: 324 mg/dL — ABNORMAL HIGH (ref 70–99)
Glucose-Capillary: 297 mg/dL — ABNORMAL HIGH (ref 70–99)

## 2020-10-17 MED ORDER — IPRATROPIUM-ALBUTEROL 0.5-2.5 (3) MG/3ML IN SOLN
3.0000 mL | Freq: Three times a day (TID) | RESPIRATORY_TRACT | Status: DC
  Administered 2020-10-17 – 2020-10-19 (×5): 3 mL via RESPIRATORY_TRACT
  Filled 2020-10-17 (×5): qty 3

## 2020-10-17 MED ORDER — INSULIN ASPART 100 UNIT/ML IJ SOLN
3.0000 [IU] | Freq: Three times a day (TID) | INTRAMUSCULAR | Status: DC
  Administered 2020-10-17 – 2020-10-21 (×12): 3 [IU] via SUBCUTANEOUS
  Filled 2020-10-17 (×10): qty 1

## 2020-10-17 MED ORDER — FUROSEMIDE 10 MG/ML IJ SOLN
40.0000 mg | Freq: Two times a day (BID) | INTRAMUSCULAR | Status: DC
  Administered 2020-10-17 – 2020-10-18 (×2): 40 mg via INTRAVENOUS
  Filled 2020-10-17 (×3): qty 4

## 2020-10-17 MED ORDER — INSULIN ASPART 100 UNIT/ML IJ SOLN: 0.0000 [IU] | Freq: Three times a day (TID) | INTRAMUSCULAR | Status: DC

## 2020-10-17 MED ORDER — INSULIN ASPART 100 UNIT/ML IJ SOLN
0.0000 [IU] | Freq: Three times a day (TID) | INTRAMUSCULAR | Status: DC
  Administered 2020-10-17: 11 [IU] via SUBCUTANEOUS
  Administered 2020-10-18: 3 [IU] via SUBCUTANEOUS
  Administered 2020-10-18: 10:00:00 2 [IU] via SUBCUTANEOUS
  Administered 2020-10-18 – 2020-10-19 (×2): 3 [IU] via SUBCUTANEOUS
  Administered 2020-10-19 (×2): 5 [IU] via SUBCUTANEOUS
  Administered 2020-10-20: 2 [IU] via SUBCUTANEOUS
  Administered 2020-10-20: 3 [IU] via SUBCUTANEOUS
  Administered 2020-10-20: 17:00:00 5 [IU] via SUBCUTANEOUS
  Administered 2020-10-21: 08:00:00 3 [IU] via SUBCUTANEOUS
  Administered 2020-10-21: 2 [IU] via SUBCUTANEOUS
  Filled 2020-10-17 (×10): qty 1

## 2020-10-17 MED ORDER — INSULIN ASPART 100 UNIT/ML IJ SOLN
0.0000 [IU] | Freq: Every day | INTRAMUSCULAR | Status: DC
  Administered 2020-10-17: 22:00:00 3 [IU] via SUBCUTANEOUS
  Filled 2020-10-17: qty 1

## 2020-10-17 MED ORDER — INSULIN ASPART 100 UNIT/ML IJ SOLN: 3.0000 [IU] | Freq: Three times a day (TID) | INTRAMUSCULAR | Status: DC

## 2020-10-17 NOTE — Progress Notes (Signed)
Raymond Hood  IRW:[id number] DOB: 04-27-1970 DOA: 10/15/2020 PCP: Gwenlyn Perking, MD    Brief Narrative:  50yo with a history of HTN, GERD, depression/anxiety, tobacco abuse, COPD, RA on Humira, schizoaffective disorder, morbid obesity, and homelessness who presented to the ED with 2 days of central twisting stabbing-like abdominal pain radiating into his back.  This was associated with 4 days of watery diarrhea.  In the ED CT abdomen/pelvis noted a right renal mass and bilateral adrenal gland enlargement (chronic issues followed at [facility]).  Significant Events:  10/6 admit via ED  Consultants:  Urology  Code Status: FULL CODE  Antimicrobials:  Azithromycin 10/6 >  DVT prophylaxis: Subcu heparin  Subjective: No acute events noted overnight.  Afebrile.  Vital signs stable.  Saturation 97% on room air.  CBG remains elevated.  Appears grossly volume overloaded on exam.  States that he is feeling better overall.  Reports that his abdominal pain has essentially resolved.  Assessment & Plan:  Abdominal pain with diarrhea Idiopathic -lipase normal -COVID-negative -no clinical evidence to suggest acute infection and no significant risk factors for C. difficile colitis -diarrhea appears to have spontaneously resolved  Anasarca -gross volume overload Noted on physical exam -check TTE  Right renal mass A chronic issue that has been evaluated and is being followed at [facility] -no further investigation required this admission  Hyperglycemia No known prior diagnosis of DM - A1c pending - SSI for now - stop steroid  Schizoaffective disorder Continue usual home medical therapies  Tobacco abuse Utilize nicotine patch - counseled on advisability of discontinuing smoking  HTN Admits that he has not been taking Norvasc -blood pressure reasonably controlled without medical therapy at present  COPD Patient reports being chronically short of breath -exam at admission consistent with mild  bronchospastic exacerbation - continue bronchodilators -stop systemic steroid today and transition to inhaled steroid -diurese  Morbid obesity - Body mass index is 56.26 kg/m.  RA Receives Humira injections every 14 days -followed by rheumatologist   Family Communication: No family present -patient states he has no family in the area Status is: Inpatient  Remains inpatient appropriate because:Inpatient level of care appropriate due to severity of illness  Dispo: The patient is from: Home              Anticipated d/c is to:  Unclear              Patient currently is not medically stable to d/c.   Difficult to place patient Yes   Objective: Blood pressure 120/77, pulse 98, temperature 97.8 F (36.6 C), resp. rate 18, height 5\' 8"  (1.727 m), weight (!) 167.8 kg, SpO2 97 %.  Intake/Output Summary (Last 24 hours) at 10/17/2020 0835 Last data filed at 10/17/2020 0730 Gross per 24 hour  Intake --  Output 3475 ml  Net -3475 ml   Filed Weights   10/14/20 2012  Weight: (!) 167.8 kg    Examination: General: No acute respiratory distress -alert and conversant Lungs: Poor air movement diffusely with mild bibasilar crackles without active wheezing Cardiovascular: Regular rate and rhythm without murmur -heart sounds distant Abdomen: Obese, soft, bowel sounds positive, no rebound, no appreciable mass Extremities: 1+ bilateral lower extremity edema with edema across abdominal wall and even of upper extremities as well  CBC: Recent Labs  Lab 10/14/20 2014 10/16/20 0336  WBC 9.4 11.6*  HGB 16.4 15.1  HCT 48.7 48.2  MCV 91.2 90.4  PLT 191 207    Basic Metabolic Panel:  Recent Labs  Lab 10/14/20 2050  NA 137  K 3.9  CL 99  CO2 28  GLUCOSE 148*  BUN 16  CREATININE 1.03  CALCIUM 8.4*    GFR: Estimated Creatinine Clearance: 135.7 mL/min (by C-G formula based on SCr of 1.03 mg/dL).  Liver Function Tests: Recent Labs  Lab 10/14/20 2050  AST 19  ALT 23  ALKPHOS 76   BILITOT 0.6  PROT 7.1  ALBUMIN 3.4*    Recent Labs  Lab 10/14/20 2050  LIPASE 36     Coagulation Profile: Recent Labs  Lab 10/15/20 0824  INR 1.0     HbA1C: No results found for: HGBA1C  CBG: Recent Labs  Lab 10/16/20 0755 10/16/20 1225 10/16/20 1709 10/16/20 2121 10/17/20 0733  GLUCAP 294* 270* 317* 358* 217*     Recent Results (from the past 240 hour(s))  Resp Panel by RT-PCR (Flu A&B, Covid) Nasopharyngeal Swab     Status: None   Collection Time: 10/15/20  4:39 AM   Specimen: Nasopharyngeal Swab; Nasopharyngeal(NP) swabs in vial transport medium  Result Value Ref Range Status   SARS Coronavirus 2 by RT PCR NEGATIVE NEGATIVE Final    Comment: (NOTE) SARS-CoV-2 target nucleic acids are NOT DETECTED.  The SARS-CoV-2 RNA is generally detectable in upper respiratory specimens during the acute phase of infection. The lowest concentration of SARS-CoV-2 viral copies this assay can detect is 138 copies/mL. A negative result does not preclude SARS-Cov-2 infection and should not be used as the sole basis for treatment or other patient management decisions. A negative result may occur with  improper specimen collection/handling, submission of specimen other than nasopharyngeal swab, presence of viral mutation(s) within the areas targeted by this assay, and inadequate number of viral copies(<138 copies/mL). A negative result must be combined with clinical observations, patient history, and epidemiological information. The expected result is Negative.  Fact Sheet for Patients:  BloggerCourse.com  Fact Sheet for Healthcare Providers:  SeriousBroker.it  This test is no t yet approved or cleared by the [place] FDA and  has been authorized for detection and/or diagnosis of SARS-CoV-2 by FDA under an Emergency Use Authorization (EUA). This EUA will remain  in effect (meaning this test can be used) for the  duration of the COVID-19 declaration under Section 564(b)(1) of the Act, 21 U.S.C.section 360bbb-3(b)(1), unless the authorization is terminated  or revoked sooner.       Influenza A by PCR NEGATIVE NEGATIVE Final   Influenza B by PCR NEGATIVE NEGATIVE Final    Comment: (NOTE) The Xpert Xpress SARS-CoV-2/FLU/RSV plus assay is intended as an aid in the diagnosis of influenza from Nasopharyngeal swab specimens and should not be used as a sole basis for treatment. Nasal washings and aspirates are unacceptable for Xpert Xpress SARS-CoV-2/FLU/RSV testing.  Fact Sheet for Patients: BloggerCourse.com  Fact Sheet for Healthcare Providers: SeriousBroker.it  This test is not yet approved or cleared by the [place] FDA and has been authorized for detection and/or diagnosis of SARS-CoV-2 by FDA under an Emergency Use Authorization (EUA). This EUA will remain in effect (meaning this test can be used) for the duration of the COVID-19 declaration under Section 564(b)(1) of the Act, 21 U.S.C. section 360bbb-3(b)(1), unless the authorization is terminated or revoked.  Performed at [facility], [address] [place], [place] [address]       Scheduled Meds:  azithromycin  250 mg Oral Daily   budesonide (PULMICORT) nebulizer solution  0.25 mg Nebulization BID  DULoxetine  60 mg Oral Daily   enoxaparin (LOVENOX) injection  0.5 mg/kg Subcutaneous Q24H   famotidine  20 mg Oral BID   furosemide  40 mg Oral Daily   insulin aspart  0-5 Units Subcutaneous QHS   insulin aspart  0-9 Units Subcutaneous TID WC   ipratropium-albuterol  3 mL Nebulization Q6H   nicotine  21 mg Transdermal Daily   risperidone  4 mg Oral Daily   tamsulosin  0.4 mg Oral Daily      LOS: 2 days   Lonia Blood, MD [facility] Office  [phone] Pager - Text Page per Loretha Stapler  If 7PM-7AM, please contact night-coverage per  Amion 10/17/2020, 8:35 AM

## 2020-10-18 ENCOUNTER — Inpatient Hospital Stay (HOSPITAL_COMMUNITY)
Admit: 2020-10-18 | Discharge: 2020-10-18 | Disposition: A | Discharge status: Home / Self Care | Payer: Self-pay | Attending: Internal Medicine | Admitting: Internal Medicine

## 2020-10-18 DIAGNOSIS — J810 Acute pulmonary edema: Secondary | ICD-10-CM

## 2020-10-18 LAB — ECHOCARDIOGRAM COMPLETE
AR max vel: 2.93 cm2
AV Area VTI: 2.66 cm2
AV Area mean vel: 2.51 cm2
AV Mean grad: 4 mmHg
AV Peak grad: 6.1 mmHg
Ao pk vel: 1.23 m/s
Area-P 1/2: 8.07 cm2
Calc EF: 57.3 %
MV VTI: 2.95 cm2
S' Lateral: 3.8 cm
Single Plane A2C EF: 53 %
Single Plane A4C EF: 62.9 %

## 2020-10-18 LAB — BASIC METABOLIC PANEL
Anion gap: 9 (ref 5–15)
BUN: 21 mg/dL — ABNORMAL HIGH (ref 6–20)
CO2: 37 mmol/L — ABNORMAL HIGH (ref 22–32)
Calcium: 8.6 mg/dL — ABNORMAL LOW (ref 8.9–10.3)
Chloride: 93 mmol/L — ABNORMAL LOW (ref 98–111)
Creatinine, Ser: 0.97 mg/dL (ref 0.61–1.24)
GFR, Estimated: >60 mL/min (ref >60)
Glucose, Bld: 131 mg/dL — ABNORMAL HIGH (ref 70–99)
Potassium: 3.5 mmol/L (ref 3.5–5.1)
Sodium: 139 mmol/L (ref 135–145)

## 2020-10-18 LAB — GLUCOSE, CAPILLARY
Glucose-Capillary: 133 mg/dL — ABNORMAL HIGH (ref 70–99)
Glucose-Capillary: 164 mg/dL — ABNORMAL HIGH (ref 70–99)
Glucose-Capillary: 177 mg/dL — ABNORMAL HIGH (ref 70–99)
Glucose-Capillary: 190 mg/dL — ABNORMAL HIGH (ref 70–99)

## 2020-10-18 LAB — MAGNESIUM: Magnesium: 2.5 mg/dL — ABNORMAL HIGH (ref 1.7–2.4)

## 2020-10-18 MED ORDER — PERFLUTREN LIPID MICROSPHERE
1.0000 mL | INTRAVENOUS | Status: AC | PRN
  Administered 2020-10-18: 3 mL via INTRAVENOUS
  Filled 2020-10-18: qty 10

## 2020-10-18 NOTE — Progress Notes (Signed)
Raymond Hood  JEH:[id number] DOB: [birth year] DOA: 10/15/2020 PCP: Gwenlyn Perking, MD    Brief Narrative:  50yo with a history of HTN, GERD, depression/anxiety, tobacco abuse, COPD, RA on Humira, schizoaffective disorder, morbid obesity, and homelessness who presented to the ED with 2 days of central twisting stabbing-like abdominal pain radiating into his back.  This was associated with 4 days of watery diarrhea.  In the ED CT abdomen/pelvis noted a right renal mass and bilateral adrenal gland enlargement (chronic issues followed at [facility]).  Significant Events:  10/6 admit via ED  Consultants:  Urology  Code Status: FULL CODE  Antimicrobials:  Azithromycin 10/6 >  DVT prophylaxis: Subcu heparin  Subjective: Afebrile.  Vital signs stable apart from a low-grade tachycardia at 106.  Saturation 93% on room air.  No acute events reported overnight.  TTE pending.  Diuresis ongoing.  CBG improving.  Tells me he is less short of breath today.  Denies abdominal pain or any further diarrhea.  Assessment & Plan:  Abdominal pain with diarrhea Idiopathic -lipase normal -COVID-negative -no clinical evidence to suggest acute infection and no significant risk factors for C. difficile colitis -diarrhea has spontaneously resolved  Anasarca -gross volume overload Noted on physical exam -checking TTE  Right renal mass A chronic issue that has been evaluated and is being followed at [facility] -no further investigation required this admission  Hyperglycemia No known prior diagnosis of DM - A1c still pending for unclear reasons - SSI for now - stopped steroid  Schizoaffective disorder Continue usual home medical therapies  Tobacco abuse Utilize nicotine patch - counseled on advisability of discontinuing smoking  HTN Admits that he has not been taking Norvasc -blood pressure reasonably controlled without medical therapy at present -continue to monitor trend  COPD Patient reports being  chronically short of breath -exam at admission consistent with mild bronchospastic exacerbation - continue bronchodilators -stopped systemic steroid and transitioned to inhaled steroid -diuresis ongoing   Morbid obesity - Body mass index is 56.26 kg/m.  RA Receives Humira injections every 14 days -followed by rheumatologist   Family Communication: No family present -patient states he has no family in the area Status is: Inpatient  Remains inpatient appropriate because:Inpatient level of care appropriate due to severity of illness  Dispo: The patient is from: Home              Anticipated d/c is to:  Unclear              Patient currently is not medically stable to d/c.   Difficult to place patient Yes   Objective: Blood pressure 138/90, pulse (!) 106, temperature 98 F (36.7 C), temperature source Oral, resp. rate 19, height 5\' 8"  (1.727 m), weight (!) 167.8 kg, SpO2 93 %.  Intake/Output Summary (Last 24 hours) at 10/18/2020 0857 Last data filed at 10/18/2020 0452 Gross per 24 hour  Intake --  Output 4850 ml  Net -4850 ml    Filed Weights   10/14/20 2012  Weight: (!) 167.8 kg    Examination: General: No acute respiratory distress  Lungs: Poor air movement diffusely with distant breath sounds likely related to body habitus and no wheezing Cardiovascular: Regular rate and rhythm without murmur -heart sounds distant Abdomen: Obese, soft, bowel sounds positive, no rebound, no appreciable mass Extremities: Trace bilateral lower extremity edema with overall improved anasarca  CBC: Recent Labs  Lab 10/14/20 2014 10/16/20 0336  WBC 9.4 11.6*  HGB 16.4 15.1  HCT 48.7 48.2  MCV 91.2 90.4  PLT 191 207    Basic Metabolic Panel: Recent Labs  Lab 10/14/20 2050 10/18/20 0413  NA 137 139  K 3.9 3.5  CL 99 93*  CO2 28 37*  GLUCOSE 148* 131*  BUN 16 21*  CREATININE 1.03 0.97  CALCIUM 8.4* 8.6*  MG  --  2.5*    GFR: Estimated Creatinine Clearance: 144.1 mL/min (by  C-G formula based on SCr of 0.97 mg/dL).  Liver Function Tests: Recent Labs  Lab 10/14/20 2050  AST 19  ALT 23  ALKPHOS 76  BILITOT 0.6  PROT 7.1  ALBUMIN 3.4*    Recent Labs  Lab 10/14/20 2050  LIPASE 36     Coagulation Profile: Recent Labs  Lab 10/15/20 0824  INR 1.0     HbA1C: No results found for: HGBA1C  CBG: Recent Labs  Lab 10/17/20 0733 10/17/20 1137 10/17/20 1547 10/17/20 2143 10/18/20 0717  GLUCAP 217* 269* 324* 297* 133*     Recent Results (from the past 240 hour(s))  Resp Panel by RT-PCR (Flu A&B, Covid) Nasopharyngeal Swab     Status: None   Collection Time: 10/15/20  4:39 AM   Specimen: Nasopharyngeal Swab; Nasopharyngeal(NP) swabs in vial transport medium  Result Value Ref Range Status   SARS Coronavirus 2 by RT PCR NEGATIVE NEGATIVE Final    Comment: (NOTE) SARS-CoV-2 target nucleic acids are NOT DETECTED.  The SARS-CoV-2 RNA is generally detectable in upper respiratory specimens during the acute phase of infection. The lowest concentration of SARS-CoV-2 viral copies this assay can detect is 138 copies/mL. A negative result does not preclude SARS-Cov-2 infection and should not be used as the sole basis for treatment or other patient management decisions. A negative result may occur with  improper specimen collection/handling, submission of specimen other than nasopharyngeal swab, presence of viral mutation(s) within the areas targeted by this assay, and inadequate number of viral copies(<138 copies/mL). A negative result must be combined with clinical observations, patient history, and epidemiological information. The expected result is Negative.  Fact Sheet for Patients:  BloggerCourse.com  Fact Sheet for Healthcare Providers:  SeriousBroker.it  This test is no t yet approved or cleared by the [place] FDA and  has been authorized for detection and/or diagnosis of SARS-CoV-2  by FDA under an Emergency Use Authorization (EUA). This EUA will remain  in effect (meaning this test can be used) for the duration of the COVID-19 declaration under Section 564(b)(1) of the Act, 21 U.S.C.section 360bbb-3(b)(1), unless the authorization is terminated  or revoked sooner.       Influenza A by PCR NEGATIVE NEGATIVE Final   Influenza B by PCR NEGATIVE NEGATIVE Final    Comment: (NOTE) The Xpert Xpress SARS-CoV-2/FLU/RSV plus assay is intended as an aid in the diagnosis of influenza from Nasopharyngeal swab specimens and should not be used as a sole basis for treatment. Nasal washings and aspirates are unacceptable for Xpert Xpress SARS-CoV-2/FLU/RSV testing.  Fact Sheet for Patients: BloggerCourse.com  Fact Sheet for Healthcare Providers: SeriousBroker.it  This test is not yet approved or cleared by the [place] FDA and has been authorized for detection and/or diagnosis of SARS-CoV-2 by FDA under an Emergency Use Authorization (EUA). This EUA will remain in effect (meaning this test can be used) for the duration of the COVID-19 declaration under Section 564(b)(1) of the Act, 21 U.S.C. section 360bbb-3(b)(1), unless the authorization is terminated or revoked.  Performed at [facility], [address], [place], [place]  [address]       Scheduled Meds:  azithromycin  250 mg Oral Daily   budesonide (PULMICORT) nebulizer solution  0.25 mg Nebulization BID   DULoxetine  60 mg Oral Daily   enoxaparin (LOVENOX) injection  0.5 mg/kg Subcutaneous Q24H   famotidine  20 mg Oral BID   furosemide  40 mg Intravenous Q12H   insulin aspart  0-15 Units Subcutaneous TID WC   insulin aspart  0-5 Units Subcutaneous QHS   insulin aspart  3 Units Subcutaneous TID WC   ipratropium-albuterol  3 mL Nebulization TID   nicotine  21 mg Transdermal Daily   risperidone  4 mg Oral Daily   tamsulosin  0.4 mg Oral Daily       LOS: 3 days   Lonia Blood, MD [facility] Office  [phone] Pager - Text Page per Loretha Stapler  If 7PM-7AM, please contact night-coverage per Amion 10/18/2020, 8:57 AM

## 2020-10-18 NOTE — Progress Notes (Signed)
   10/18/20 1257  Assess: MEWS Score  Temp 97.6 F (36.4 C)  BP 100/73  Pulse Rate (!) 102 (Ptient just returned from bathroom)  Resp 18  Level of Consciousness Alert  SpO2 95 %  O2 Device Nasal Cannula  Assess: MEWS Score  MEWS Temp 0  MEWS Systolic 1  MEWS Pulse 1  MEWS RR 0  MEWS LOC 0  MEWS Score 2  MEWS Score Color Yellow  Assess: if the MEWS score is Yellow or Red  Were vital signs taken at a resting state? No  Focused Assessment No change from prior assessment  Does the patient meet 2 or more of the SIRS criteria? Yes  Does the patient have a confirmed or suspected source of infection? No  MEWS guidelines implemented *See Row Information* No, other (Comment)  Treat  MEWS Interventions Escalated (See documentation below)  Pain Scale 0-10  Pain Score 3  Pain Type Acute pain  Pain Location Back  Pain Orientation Mid  Pain Descriptors / Indicators Aching  Escalate  MEWS: Escalate Yellow: discuss with charge nurse/RN and consider discussing with provider and RRT  Notify: Charge Nurse/RN  Name of Charge Nurse/RN Notified Malka  Date Charge Nurse/RN Notified 10/18/20  Time Charge Nurse/RN Notified 1500  Document  Patient Outcome Other (Comment) (stble)  Progress note created (see row info) Yes  Assess: SIRS CRITERIA  SIRS Temperature  0  SIRS Pulse 1  SIRS Respirations  0  SIRS WBC 0  SIRS Score Sum  1

## 2020-10-18 NOTE — Progress Notes (Signed)
*  PRELIMINARY RESULTS* Echocardiogram 2D Echocardiogram has been performed.  Joanette Gula Keiarah Orlowski 10/18/2020, 9:16 AM

## 2020-10-18 NOTE — Progress Notes (Signed)
   10/18/20 1733  Assess: MEWS Score  Pulse Rate (!) 110 (Apical 60 sec count)  Assess: MEWS Score  MEWS Temp 0  MEWS Systolic 0  MEWS Pulse 1  MEWS RR 0  MEWS LOC 0  MEWS Score 1  MEWS Score Color Green  Assess: if the MEWS score is Yellow or Red  Were vital signs taken at a resting state? No  Focused Assessment No change from prior assessment  Does the patient meet 2 or more of the SIRS criteria? Yes  Does the patient have a confirmed or suspected source of infection? No  MEWS guidelines implemented *See Row Information* No, vital signs rechecked  Treat  MEWS Interventions Escalated (See documentation below)  Pain Scale 0-10  Take Vital Signs  Increase Vital Sign Frequency   (no recheck was GREEN MEWS)  Escalate  MEWS: Escalate  (No MEWS GREEN with recheck)  Notify: Charge Nurse/RN  Name of Charge Nurse/RN Notified Malka  Date Charge Nurse/RN Notified 10/18/20  Time Charge Nurse/RN Notified 1736  Document  Patient Outcome Other (Comment) (Stable)  Progress note created (see row info) Yes  Assess: SIRS CRITERIA  SIRS Temperature  0  SIRS Pulse 1  SIRS Respirations  0  SIRS WBC 0  SIRS Score Sum  1

## 2020-10-19 LAB — COMPREHENSIVE METABOLIC PANEL
ALT: 24 U/L (ref 0–44)
AST: 21 U/L (ref 15–41)
Albumin: 3.6 g/dL (ref 3.5–5.0)
Alkaline Phosphatase: 68 U/L (ref 38–126)
Anion gap: 9 (ref 5–15)
BUN: 26 mg/dL — ABNORMAL HIGH (ref 6–20)
CO2: 36 mmol/L — ABNORMAL HIGH (ref 22–32)
Calcium: 8.6 mg/dL — ABNORMAL LOW (ref 8.9–10.3)
Chloride: 91 mmol/L — ABNORMAL LOW (ref 98–111)
Creatinine, Ser: 0.97 mg/dL (ref 0.61–1.24)
GFR, Estimated: >60 mL/min (ref >60)
Glucose, Bld: 169 mg/dL — ABNORMAL HIGH (ref 70–99)
Potassium: 4.1 mmol/L (ref 3.5–5.1)
Sodium: 136 mmol/L (ref 135–145)
Total Bilirubin: 1 mg/dL (ref 0.3–1.2)
Total Protein: 7.4 g/dL (ref 6.5–8.1)

## 2020-10-19 LAB — CBC
HCT: 51.9 % (ref 39.0–52.0)
Hemoglobin: 16.2 g/dL (ref 13.0–17.0)
MCH: 28.4 pg (ref 26.0–34.0)
MCHC: 31.2 g/dL (ref 30.0–36.0)
MCV: 90.9 fL (ref 80.0–100.0)
Platelets: 203 10*3/uL (ref 150–400)
RBC: 5.71 MIL/uL (ref 4.22–5.81)
RDW: 14.5 % (ref 11.5–15.5)
WBC: 10.5 10*3/uL (ref 4.0–10.5)
nRBC: 0 % (ref 0.0–0.2)

## 2020-10-19 LAB — HEMOGLOBIN A1C
Hgb A1c MFr Bld: 7.3 % — ABNORMAL HIGH (ref 4.8–5.6)
Mean Plasma Glucose: 163 mg/dL

## 2020-10-19 LAB — GLUCOSE, CAPILLARY
Glucose-Capillary: 204 mg/dL — ABNORMAL HIGH (ref 70–99)
Glucose-Capillary: 171 mg/dL — ABNORMAL HIGH (ref 70–99)
Glucose-Capillary: 218 mg/dL — ABNORMAL HIGH (ref 70–99)
Glucose-Capillary: 149 mg/dL — ABNORMAL HIGH (ref 70–99)

## 2020-10-19 LAB — MAGNESIUM: Magnesium: 2.5 mg/dL — ABNORMAL HIGH (ref 1.7–2.4)

## 2020-10-19 LAB — D-DIMER, QUANTITATIVE: D-Dimer, Quant: 0.64 ug/mL-FEU — ABNORMAL HIGH (ref 0.00–0.50)

## 2020-10-19 MED ORDER — TIOTROPIUM BROMIDE MONOHYDRATE 18 MCG IN CAPS
18.0000 ug | ORAL_CAPSULE | Freq: Every day | RESPIRATORY_TRACT | Status: DC
  Administered 2020-10-19 – 2020-10-21 (×3): 18 ug via RESPIRATORY_TRACT
  Filled 2020-10-19: qty 5

## 2020-10-19 MED ORDER — METFORMIN HCL 500 MG PO TABS
500.0000 mg | ORAL_TABLET | Freq: Two times a day (BID) | ORAL | Status: DC
  Administered 2020-10-19 – 2020-10-21 (×4): 500 mg via ORAL
  Filled 2020-10-19 (×7): qty 1

## 2020-10-19 MED ORDER — FLUTICASONE FUROATE-VILANTEROL 100-25 MCG/INH IN AEPB
1.0000 | INHALATION_SPRAY | Freq: Every day | RESPIRATORY_TRACT | Status: DC
  Administered 2020-10-19 – 2020-10-21 (×3): 1 via RESPIRATORY_TRACT
  Filled 2020-10-19: qty 28

## 2020-10-19 NOTE — Evaluation (Addendum)
Physical Therapy Evaluation Patient Details Name: Raymond Hood MRN: [id number] DOB: [birth year] Today's Date: 10/19/2020  History of Present Illness  50 y/o male presenting to ED with abdominal pain x2 days described as twisting/stabbing and radiating into his back. Medical history significant for HTN, GERD, depression/anxiety, tobacco abuse, COPD, RA on Humira, schizoaffective disorder, morbid obesity. Pt is homeless.   Clinical Impression  Pt received supine in bed, agreeable to therapy however not pleased to mute the tv. Pt is homeless at baseline with reported absent family/friend/caregiver support. Pt did demo delayed response time as PT asked questions during history. Pt required MIN A to perform supine>sit and SUP for sit>supine. CGA for both STS and ambulation, no AD; no steadying assist was required however it was provided for safety. Ambulation distance was limited to 23ft. Pt stopped for a standing rest break on 3 occasions within first 64ft, no SOB noted and SpO2 at 97% on 2L. Pt asked to return to room. With motivation, pt was able to ambulate back without any breaks and with increased velocity, again no SOB. PT will continue to treat pt to increase ambulatory endurance and attempt to ween from O2. Would benefit from skilled PT to address above deficits and promote optimal return to PLOF.       Recommendations for follow up therapy are one component of a multi-disciplinary discharge planning process, led by the attending physician.  Recommendations may be updated based on patient status, additional functional criteria and insurance authorization.  Follow Up Recommendations Home health PT    Equipment Recommendations  None recommended by PT    Recommendations for Other Services       Precautions / Restrictions        Mobility  Bed Mobility Overal bed mobility: Needs Assistance Bed Mobility: Supine to Sit;Sit to Supine     Supine to sit: Min assist;HOB elevated Sit to  supine: Supervision;HOB elevated   General bed mobility comments: MIN A with HHA for pt to pull trunk to upright. Pt repositioned toward HOB utilizing BUE on headboard with bed flat.    Transfers Overall transfer level: Needs assistance Equipment used: None Transfers: Sit to/from Stand Sit to Stand: Min guard         General transfer comment: CGA for safety  Ambulation/Gait Ambulation/Gait assistance: Min guard Gait Distance (Feet): 50 Feet Assistive device: None Gait Pattern/deviations: Step-through pattern;Decreased stride length;Trunk flexed;Wide base of support     General Gait Details: Decreased gait velocity with 3 standing rest breaks during initial 72ft. With motivation to return to room/bed/tv, pt gait velocity increased and no rest breaks were needed. SpO2 monitored - increased to 97% (from 94% resting) with VC to breathe through nose.  Stairs            Wheelchair Mobility    Modified Rankin (Stroke Patients Only)       Balance Overall balance assessment: Needs assistance Sitting-balance support: Bilateral upper extremity supported;Feet supported Sitting balance-Leahy Scale: Fair Sitting balance - Comments: PT remained close as pt scooted hips forward to ensure pt did not slide off EOB.   Standing balance support: No upper extremity supported;During functional activity Standing balance-Leahy Scale: Fair Standing balance comment: Standing balance improved with time, no AD during ambulation, CGA provided for safety however steadying not required.            Pt educated on SpO2 norms and the importance of maintaining these values. Pt also educated on lying supine vs sitting/standing and why O2 improved while  sititng and standing. Pt acknowledged understanding however refused to sit in chair at end of session.                 Pertinent Vitals/Pain Pain Assessment: 0-10 Pain Score: 8  Pain Location: cervical and lumbar spine Pain Descriptors /  Indicators: Aching Pain Intervention(s): Limited activity within patient's tolerance;Monitored during session;Premedicated before session;Repositioned    Home Living Family/patient expects to be discharged to:: Shelter/Homeless                 Additional Comments: Lives behind [employer]. States he would like to get into a shelter.    Prior Function Level of Independence: Independent         Comments: States he is independent however has difficulty walking and performing floor transfers. States he can walk for <5 minutes before becoming SOB. States he has no family/friends in the area to assist with transportation.     Hand Dominance        Extremity/Trunk Assessment   Upper Extremity Assessment Upper Extremity Assessment: Overall WFL for tasks assessed    Lower Extremity Assessment Lower Extremity Assessment: Overall WFL for tasks assessed;Generalized weakness (Functional strength however generally weak for pt habitus)    Cervical / Trunk Assessment Cervical / Trunk Assessment: Kyphotic  Communication   Communication: No difficulties  Cognition Arousal/Alertness: Awake/alert Behavior During Therapy: Restless Overall Cognitive Status: Within Functional Limits for tasks assessed                                 General Comments: A&Ox3 - unaware of day, date, month however is aware of the year. Delayed response to questions throughout session due to increased processing time.      General Comments General comments (skin integrity, edema, etc.): SpO2: 94% at rest, supine; 95% sitting; 97% standing and with ambulation - all while on 2L O2.    Exercises Other Exercises Other Exercises: Pt educated on SpO2 norms and the importance of maintaining these values. Pt also educated on lying supine vs sitting/standing and why O2 improved while sititng and standing. Pt acknowledged understanding however refused to sit in chair at end of session.    Assessment/Plan    PT Assessment Patient needs continued PT services  PT Problem List Decreased strength;Decreased balance;Pain;Decreased mobility;Cardiopulmonary status limiting activity;Decreased activity tolerance       PT Treatment Interventions Functional mobility training;Balance training;Patient/family education;Gait training;Therapeutic activities;Neuromuscular re-education;Therapeutic exercise    PT Goals (Current goals can be found in the Care Plan section)  Acute Rehab PT Goals Patient Stated Goal: to go to a shelter PT Goal Formulation: With patient Time For Goal Achievement: 11/02/20 Potential to Achieve Goals: Good Additional Goals Additional Goal #1: Will perform floor transfer independently in order to demo ability to perform once d/c due to sleeping on ground.    Frequency Min 2X/week   Barriers to discharge Inaccessible home environment;Decreased caregiver support Homeless, no family/friends in the area to assist with transportation    Co-evaluation               AM-PAC PT "6 Clicks" Mobility  Outcome Measure Help needed turning from your back to your side while in a flat bed without using bedrails?: A Little Help needed moving from lying on your back to sitting on the side of a flat bed without using bedrails?: A Little Help needed moving to and from a bed to a chair (  including a wheelchair)?: A Little Help needed standing up from a chair using your arms (e.g., wheelchair or bedside chair)?: A Little Help needed to walk in hospital room?: A Little Help needed climbing 3-5 steps with a railing? : A Lot 6 Click Score: 17    End of Session Equipment Utilized During Treatment: Gait belt;Oxygen Activity Tolerance: Patient tolerated treatment well;Patient limited by fatigue Patient left: in bed;with call bell/phone within reach Nurse Communication: Mobility status;Precautions PT Visit Diagnosis: Unsteadiness on feet (R26.81);Muscle weakness  (generalized) (M62.81);Difficulty in walking, not elsewhere classified (R26.2)    Time: 5974-1638 PT Time Calculation (min) (ACUTE ONLY): 27 min   Charges:     PT Treatments $Therapeutic Activity: 8-22 mins        Basilia Jumbo PT, DPT 10/19/20 1:01 PM [phone]   Lavenia Atlas 10/19/2020, 1:01 PM

## 2020-10-19 NOTE — Progress Notes (Signed)
Inpatient Diabetes Program Recommendations  AACE/ADA: New Consensus Statement on Inpatient Glycemic Control (2015)  Target Ranges:  Prepandial:   less than 140 mg/dL      Peak postprandial:   less than 180 mg/dL (1-2 hours)      Critically ill patients:  140 - 180 mg/dL   Lab Results  Component Value Date   GLUCAP 204 (H) 10/19/2020   HGBA1C 7.3 (H) 10/16/2020    Review of Glycemic Control Results for Raymond Hood, Raymond Hood (MRN [id number]) as of 10/19/2020 09:16  Ref. Range 10/18/2020 12:24 10/18/2020 17:22 10/18/2020 21:34 10/19/2020 08:19  Glucose-Capillary Latest Ref Range: 70 - 99 mg/dL 921 (H) 194 (H) 174 (H) 204 (H)   Diabetes history: ? New diagnosis of DM Outpatient Diabetes medications: None Current orders for Inpatient glycemic control:  Novolog 0-15 units tid with meals and HS Novolog 3 units tid with meals Metformin 500 mg bid Inpatient Diabetes Program Recommendations:    Note A1C=7.3% indicating potential new diagnosis of DM.  Will follow and talk to patient regarding elevated glucose.  Thanks, Beryl Meager, RN, BC-ADM Inpatient Diabetes Coordinator Pager [phone]  (8a-5p)

## 2020-10-19 NOTE — Progress Notes (Signed)
Raymond Hood  YKZ:[id number] DOB: 1970-05-29 DOA: 10/15/2020 PCP: Gwenlyn Perking, MD    Brief Narrative:  49yo with a history of HTN, GERD, depression/anxiety, tobacco abuse, COPD, RA on Humira, schizoaffective disorder, morbid obesity, and homelessness who presented to the ED with 2 days of central twisting stabbing-like abdominal pain radiating into his back.  This was associated with 4 days of watery diarrhea.  In the ED CT abdomen/pelvis noted a right renal mass and bilateral adrenal gland enlargement (chronic issues followed at [facility]).  Significant Events:  10/6 admit via ED  Consultants:  Urology  Code Status: FULL CODE  Antimicrobials:  Azithromycin 10/6 >  DVT prophylaxis: Subcu heparin  Subjective: Transient episodes of tachycardia were appreciated yesterday afternoon with heart rates up to 110.  Presently afebrile with stable blood pressure and saturation 94% on 1 L nasal cannula.  Heart rate 107 at rest. No new complaints today. Reports he still gets SOB w/ exertion, and tires quickly.   Assessment & Plan:  Abdominal pain with diarrhea Idiopathic -lipase normal -COVID-negative -no clinical evidence to suggest acute infection and no significant risk factors for C. difficile colitis -diarrhea has spontaneously resolved  Chronic B LE edema / abdom wall edema  Noted on physical exam - TTE w/o evidence of CHF - suspect this is due to body habitus/obesity and venous stasis   Sinus tachycardia Likely due to diuresis, as well as signif deconditioning - PT/OT evals - check d-dimer as obesity and sedentary lifestyle put him at risk for DVT/PE  Right renal mass A chronic issue that has been evaluated and is being followed at [facility] -no further investigation required this admission  Newly diagnoses DM2 w/ Hyperglycemia No known prior diagnosis of DM - A1c 7.3 - SSI for now - begin education - outpt monitoring will be difficult due to homelessness  Schizoaffective  disorder Continue usual home medical therapies  Tobacco abuse Utilize nicotine patch - counseled on advisability of discontinuing smoking  HTN Admits that he has not been taking Norvasc -blood pressure reasonably controlled without medical therapy at present - add ACEi once gently rehydrated given new diag of DM - follow renal fxn   COPD Patient reports being chronically short of breath - exam at admission consistent with mild bronchospastic exacerbation - continue bronchodilators -stopped systemic steroid and transitioned to inhaled steroid -diuresis ongoing   Morbid obesity - Body mass index is 56.26 kg/m.  RA Receives Humira injections every 14 days -followed by rheumatologist   Family Communication: No family present - patient states he has no family in the area Status is: Inpatient  Remains inpatient appropriate because:Inpatient level of care appropriate due to severity of illness  Dispo: The patient is from: Home              Anticipated d/c is to:  Unclear              Patient currently is not medically stable to d/c.   Difficult to place patient Yes   Objective: Blood pressure (!) 134/94, pulse (!) 107, temperature 97.9 F (36.6 C), resp. rate 18, height 5\' 8"  (1.727 m), weight (!) 167.8 kg, SpO2 94 %.  Intake/Output Summary (Last 24 hours) at 10/19/2020 0846 Last data filed at 10/19/2020 0030 Gross per 24 hour  Intake 120 ml  Output 2050 ml  Net -1930 ml    Filed Weights   10/14/20 2012  Weight: (!) 167.8 kg    Examination: General: No acute respiratory distress  Lungs: Poor air movement diffusely - distant BS th/o - no wheezing  Cardiovascular: tachycardic, regular, no M or rub - HS distant  Abdomen: Obese, soft, bowel sounds positive, no rebound, no appreciable mass Extremities: Trace bilateral lower extremity edema   CBC: Recent Labs  Lab 10/14/20 2014 10/16/20 0336 10/19/20 0416  WBC 9.4 11.6* 10.5  HGB 16.4 15.1 16.2  HCT 48.7 48.2 51.9   MCV 91.2 90.4 90.9  PLT 191 207 203    Basic Metabolic Panel: Recent Labs  Lab 10/14/20 2050 10/18/20 0413 10/19/20 0416  NA 137 139 136  K 3.9 3.5 4.1  CL 99 93* 91*  CO2 28 37* 36*  GLUCOSE 148* 131* 169*  BUN 16 21* 26*  CREATININE 1.03 0.97 0.97  CALCIUM 8.4* 8.6* 8.6*  MG  --  2.5* 2.5*    GFR: Estimated Creatinine Clearance: 144.1 mL/min (by C-G formula based on SCr of 0.97 mg/dL).  Liver Function Tests: Recent Labs  Lab 10/14/20 2050 10/19/20 0416  AST 19 21  ALT 23 24  ALKPHOS 76 68  BILITOT 0.6 1.0  PROT 7.1 7.4  ALBUMIN 3.4* 3.6    Recent Labs  Lab 10/14/20 2050  LIPASE 36     Coagulation Profile: Recent Labs  Lab 10/15/20 0824  INR 1.0     HbA1C: Hgb A1c MFr Bld  Date/Time Value Ref Range Status  10/16/2020 03:36 AM 7.3 (H) 4.8 - 5.6 % Final    Comment:    (NOTE)         Prediabetes: 5.7 - 6.4         Diabetes: >6.4         Glycemic control for adults with diabetes: <7.0     CBG: Recent Labs  Lab 10/18/20 0717 10/18/20 1224 10/18/20 1722 10/18/20 2134 10/19/20 0819  GLUCAP 133* 164* 177* 190* 204*     Recent Results (from the past 240 hour(s))  Resp Panel by RT-PCR (Flu A&B, Covid) Nasopharyngeal Swab     Status: None   Collection Time: 10/15/20  4:39 AM   Specimen: Nasopharyngeal Swab; Nasopharyngeal(NP) swabs in vial transport medium  Result Value Ref Range Status   SARS Coronavirus 2 by RT PCR NEGATIVE NEGATIVE Final    Comment: (NOTE) SARS-CoV-2 target nucleic acids are NOT DETECTED.  The SARS-CoV-2 RNA is generally detectable in upper respiratory specimens during the acute phase of infection. The lowest concentration of SARS-CoV-2 viral copies this assay can detect is 138 copies/mL. A negative result does not preclude SARS-Cov-2 infection and should not be used as the sole basis for treatment or other patient management decisions. A negative result may occur with  improper specimen collection/handling,  submission of specimen other than nasopharyngeal swab, presence of viral mutation(s) within the areas targeted by this assay, and inadequate number of viral copies(<138 copies/mL). A negative result must be combined with clinical observations, patient history, and epidemiological information. The expected result is Negative.  Fact Sheet for Patients:  BloggerCourse.com  Fact Sheet for Healthcare Providers:  SeriousBroker.it  This test is no t yet approved or cleared by the [place] FDA and  has been authorized for detection and/or diagnosis of SARS-CoV-2 by FDA under an Emergency Use Authorization (EUA). This EUA will remain  in effect (meaning this test can be used) for the duration of the COVID-19 declaration under Section 564(b)(1) of the Act, 21 U.S.C.section 360bbb-3(b)(1), unless the authorization is terminated  or revoked sooner.       Influenza  A by PCR NEGATIVE NEGATIVE Final   Influenza B by PCR NEGATIVE NEGATIVE Final    Comment: (NOTE) The Xpert Xpress SARS-CoV-2/FLU/RSV plus assay is intended as an aid in the diagnosis of influenza from Nasopharyngeal swab specimens and should not be used as a sole basis for treatment. Nasal washings and aspirates are unacceptable for Xpert Xpress SARS-CoV-2/FLU/RSV testing.  Fact Sheet for Patients: BloggerCourse.com  Fact Sheet for Healthcare Providers: SeriousBroker.it  This test is not yet approved or cleared by the [place] FDA and has been authorized for detection and/or diagnosis of SARS-CoV-2 by FDA under an Emergency Use Authorization (EUA). This EUA will remain in effect (meaning this test can be used) for the duration of the COVID-19 declaration under Section 564(b)(1) of the Act, 21 U.S.C. section 360bbb-3(b)(1), unless the authorization is terminated or revoked.  Performed at [facility], [address] [place], [place] [address]       Scheduled Meds:  budesonide (PULMICORT) nebulizer solution  0.25 mg Nebulization BID   DULoxetine  60 mg Oral Daily   enoxaparin (LOVENOX) injection  0.5 mg/kg Subcutaneous Q24H   famotidine  20 mg Oral BID   insulin aspart  0-15 Units Subcutaneous TID WC   insulin aspart  0-5 Units Subcutaneous QHS   insulin aspart  3 Units Subcutaneous TID WC   ipratropium-albuterol  3 mL Nebulization TID   nicotine  21 mg Transdermal Daily   risperidone  4 mg Oral Daily   tamsulosin  0.4 mg Oral Daily      LOS: 4 days   Lonia Blood, MD [facility] Office  [phone] Pager - Text Page per Loretha Stapler  If 7PM-7AM, please contact night-coverage per Amion 10/19/2020, 8:46 AM

## 2020-10-19 NOTE — TOC Progression Note (Signed)
Transition of Care [facility]) - Progression Note    Patient Details  Name: Raymond Hood MRN: [id number] Date of Birth: 05/10/1970  [facility] of [facility] CM/SW Contact  Allayne Butcher, RN Phone Number: 10/19/2020, 1:36 PM  Clinical Narrative:    Patient is current with [facility] for PCP services and specialist services.  Patient is homeless and PT is recommending home health, this of course can not be set up with no permanent address.  Patient is on O2 at 2L and needs to be weaned to RA before DC.  He has resources on the shelter.  Medications will be sent to Medication Management and given to patient before discharge.     Expected Discharge Plan: Homeless Shelter Barriers to Discharge: Continued Medical Work up  Expected Discharge Plan and Services Expected Discharge Plan: Homeless Shelter   Discharge Planning Services: Medication Assistance, CM Consult, Other - See comment   Living arrangements for the past 2 months: Homeless                 DME Arranged: N/A DME Agency: NA       HH Arranged: NA HH Agency: NA         Social Determinants of Health (SDOH) Interventions    Readmission Risk Interventions No flowsheet data found.

## 2020-10-19 NOTE — Progress Notes (Signed)
Patient very unmotivated to do much more than watch tv and sleep. Patient did agree to shower and work with therapy after a great deal of encouragement.  Although patient does endorse currently being homeless and unable to afford his medication, patient states he is able to afford smoking 1 pack of cigarettes per day.  Patient resistant to education on diabetes, pulmonary toilet and smoking cessation.

## 2020-10-19 NOTE — Progress Notes (Signed)
Occupational Therapy Evaluation Patient Details Name: Raymond Hood MRN: [id number] DOB: 1970/12/05 Today's Date: 10/19/2020   History of Present Illness 50 y/o male presenting to ED with abdominal pain x2 days described as twisting/stabbing and radiating into his back. Medical history significant for HTN, GERD, depression/anxiety, tobacco abuse, COPD, RA on Humira, schizoaffective disorder, morbid obesity. Pt is homeless.   Clinical Impression   Mr. Kassebaum was seen for OT evaluation this date. Prior to hospital admission, pt was independent. Pt is homeless. Currently pt demonstrates impairments as described below (See OT problem list) which functionally limit his ability to perform ADL/self-care tasks. Pt currently requires MOD A for LB access, sitting EOB. SBA + single UE support for dynamic standing tasks, unable to reach outside of BOS. SUP sit<>stand. Weaned SpO2 during treatment, pt remained in 90s on RA, RN notified. Pt would benefit from skilled OT services to address noted impairments and functional limitations (see below for any additional details) in order to maximize safety and independence while minimizing falls risk and caregiver burden. Upon hospital discharge, recommend HHOT to maximize pt safety and return to functional independence during meaningful occupations of daily life.     Recommendations for follow up therapy are one component of a multi-disciplinary discharge planning process, led by the attending physician.  Recommendations may be updated based on patient status, additional functional criteria and insurance authorization.   Follow Up Recommendations  Home health OT    Equipment Recommendations  None recommended by OT    Recommendations for Other Services       Precautions / Restrictions Precautions Precautions: None Restrictions Weight Bearing Restrictions: No      Mobility Bed Mobility Overal bed mobility: Needs Assistance Bed Mobility: Supine to Sit;Sit  to Supine     Supine to sit: Supervision Sit to supine: Supervision       Transfers Overall transfer level: Needs assistance Equipment used: None Transfers: Sit to/from Stand Sit to Stand: Min guard         General transfer comment: CGA for safety    Balance Overall balance assessment: Needs assistance Sitting-balance support: Feet supported;Single extremity supported Sitting balance-Leahy Scale: Fair    Standing balance support: Single extremity supported Standing balance-Leahy Scale: Fair                            ADL either performed or assessed with clinical judgement   ADL Overall ADL's : Needs assistance/impaired                                       General ADL Comments: MOD A for LB access, sitting EOB. SBA + single UE support for dynamic standing tasks, unable to reach outside of BOS. SUP sit<>stand.      Pertinent Vitals/Pain Pain Assessment: 0-10 Pain Score: 9  Pain Location: back Pain Descriptors / Indicators: Aching;Nagging Pain Intervention(s): Patient requesting pain meds-RN notified;Repositioned     Hand Dominance     Extremity/Trunk Assessment Upper Extremity Assessment Upper Extremity Assessment: Overall WFL for tasks assessed   Lower Extremity Assessment Lower Extremity Assessment: Overall WFL for tasks assessed   Cervical / Trunk Assessment    Communication Communication Communication: No difficulties   Cognition Arousal/Alertness: Awake/alert Behavior During Therapy: Flat affect Overall Cognitive Status: Within Functional Limits for tasks assessed  General Comments  Found with [place] removed. SpO2 90% at rest on RA, maintained throughout.    Exercises Exercises: Other exercises Other Exercises Other Exercises: Pt educated re: OT role, ECS Other Exercises: supine<sit, simulated LB dressing, sit<>stand, dynamic standing   Shoulder Instructions       Home Living Family/patient expects to be discharged to:: Shelter/Homeless                                 Additional Comments: Lives behind [employer]. States he would like to get into a shelter.      Prior Functioning/Environment Level of Independence: Independent        Comments: Pt reports difficulty with LBD and perihygiene, relying on food stamps when available. States he has no family/friends in the area to assist with transportation.        OT Problem List: Decreased strength;Decreased activity tolerance;Decreased safety awareness;Obesity      OT Treatment/Interventions: Self-care/ADL training;Therapeutic exercise;Energy conservation;Therapeutic activities    OT Goals(Current goals can be found in the care plan section) Acute Rehab OT Goals Patient Stated Goal: to go to a shelter OT Goal Formulation: With patient Time For Goal Achievement: 11/02/20 Potential to Achieve Goals: Good ADL Goals Pt Will Perform Grooming: standing;Independently Pt Will Perform Lower Body Dressing: with modified independence;sit to/from stand Pt Will Transfer to Toilet: Independently;regular height toilet;ambulating  OT Frequency: Min 1X/week   Barriers to D/C:            Co-evaluation              AM-PAC OT "6 Clicks" Daily Activity     Outcome Measure Help from another person eating meals?: None Help from another person taking care of personal grooming?: A Little Help from another person toileting, which includes using toliet, bedpan, or urinal?: A Little Help from another person bathing (including washing, rinsing, drying)?: A Little Help from another person to put on and taking off regular upper body clothing?: None Help from another person to put on and taking off regular lower body clothing?: A Lot 6 Click Score: 19   End of Session Nurse Communication: Patient requests pain meds;Mobility status;Other (comment) (weaned O2 to RA)  Activity  Tolerance: Patient tolerated treatment well Patient left: in chair;with call bell/phone within reach  OT Visit Diagnosis: Unsteadiness on feet (R26.81);Other abnormalities of gait and mobility (R26.89)                Time: 9798-9211 OT Time Calculation (min): 11 min Charges:  OT General Charges $OT Visit: 1 Visit OT Evaluation $OT Eval Low Complexity: 1 Low  Teressa Senter 10/19/2020, 3:28 PM

## 2020-10-20 ENCOUNTER — Inpatient Hospital Stay: Payer: Self-pay

## 2020-10-20 ENCOUNTER — Encounter: Payer: Self-pay | Admitting: Internal Medicine

## 2020-10-20 LAB — BASIC METABOLIC PANEL
Anion gap: 10 (ref 5–15)
BUN: 26 mg/dL — ABNORMAL HIGH (ref 6–20)
CO2: 33 mmol/L — ABNORMAL HIGH (ref 22–32)
Calcium: 8.3 mg/dL — ABNORMAL LOW (ref 8.9–10.3)
Chloride: 93 mmol/L — ABNORMAL LOW (ref 98–111)
Creatinine, Ser: 0.94 mg/dL (ref 0.61–1.24)
GFR, Estimated: >60 mL/min (ref >60)
Glucose, Bld: 176 mg/dL — ABNORMAL HIGH (ref 70–99)
Potassium: 4.4 mmol/L (ref 3.5–5.1)
Sodium: 136 mmol/L (ref 135–145)

## 2020-10-20 LAB — GLUCOSE, CAPILLARY
Glucose-Capillary: 161 mg/dL — ABNORMAL HIGH (ref 70–99)
Glucose-Capillary: 138 mg/dL — ABNORMAL HIGH (ref 70–99)
Glucose-Capillary: 212 mg/dL — ABNORMAL HIGH (ref 70–99)
Glucose-Capillary: 156 mg/dL — ABNORMAL HIGH (ref 70–99)

## 2020-10-20 MED ORDER — IOHEXOL 350 MG/ML SOLN
100.0000 mL | Freq: Once | INTRAVENOUS | Status: AC | PRN
  Administered 2020-10-20: 100 mL via INTRAVENOUS

## 2020-10-20 MED ORDER — LISINOPRIL 5 MG PO TABS
2.5000 mg | ORAL_TABLET | Freq: Every day | ORAL | Status: DC
  Administered 2020-10-20 – 2020-10-21 (×2): 2.5 mg via ORAL
  Filled 2020-10-20 (×2): qty 1

## 2020-10-20 NOTE — Progress Notes (Signed)
Patient to ultra sound in stable condition.

## 2020-10-20 NOTE — Plan of Care (Signed)
  RD consulted for nutrition education regarding diabetes.   Lab Results  Component Value Date   HGBA1C 7.3 (H) 10/16/2020   Pt resting in bed at the time of assessment. Introduced self and reason for visit. Pt reports he doesn't know anything about DM other than what his RN and MD have shared with so far. During diet education, pt closed his eyes and began snoring - after completing education pt opened eyes again and reported no questions. Left handout at bedside and encouraged pt to review. Informed him that I would be happy to come back and answer any questions that may arise. Encouraged outpatient follow-up for continued education.   RD provided "Carbohydrate Counting for People with Diabetes" handout from the Academy of Nutrition and Dietetics. Discussed different food groups and their effects on blood sugar, emphasizing carbohydrate-containing foods. Provided list of carbohydrates and recommended serving sizes of common foods.  Discussed importance of controlled and consistent carbohydrate intake throughout the day. Provided examples of ways to balance meals/snacks and encouraged intake of high-fiber, whole grain complex carbohydrates. Teach back method used, however, pt not engaged and pretended to sleep.  Expect poor compliance.  Body mass index is 56.26 kg/m. Pt meets criteria for morbid obesity based on current BMI.  Current diet order is carb modified, patient is consuming approximately 100% of meals at this time. Labs and medications reviewed. No further nutrition interventions warranted at this time. RD contact information provided. If additional nutrition issues arise, please re-consult RD.  Greig Castilla, RD, LDN Clinical Dietitian RD pager # available in AMION  After hours/weekend pager # available in [facility]

## 2020-10-20 NOTE — Progress Notes (Signed)
OT Cancellation Note  Patient Details Name: Raymond Hood MRN: [id number] DOB: [birth year]   Cancelled Treatment:    Reason Eval/Treat Not Completed: Other (comment). Chart reviewed. Pt noted with pending stat CT angio to rule out PE. Will hold OT tx at this time and re-attempt at later date/time pending results and updated plan of care if needed.   Arman Filter., MPH, MS, OTR/L ascom [phone] 10/20/20, 1:50 PM

## 2020-10-20 NOTE — TOC Progression Note (Signed)
Transition of Care [facility]) - Progression Note    Patient Details  Name: Suede Greenawalt MRN: [id number] Date of Birth: [birth year]  Transition of Care [facility]) CM/SW Contact  Allayne Butcher, RN Phone Number: 10/20/2020, 1:51 PM  Clinical Narrative:    RNCM was able to speak with patient's [facility] Case Worker Anastasia Pall [phone].  Maxine Glenn was just recently assigned patient a couple of weeks ago.  She is working with him trying to find housing, Medicaid is pending and she got patient set up with a disability Clinical research associate.  She reports that neither shelter in [place] will accept patient to due is incontinence issues.  Laurena Bering does not have any type of emergency housing and patient cannot be placed in a group home without Medicaid.  Patient will have no choice at discharge but to return to the street.  TOC will get his prescriptions from [facility] and bring them to the patient at discharge.     Expected Discharge Plan: Homeless Shelter Barriers to Discharge: Continued Medical Work up  Expected Discharge Plan and Services Expected Discharge Plan: Homeless Shelter   Discharge Planning Services: Medication Assistance, CM Consult, Other - See comment   Living arrangements for the past 2 months: Homeless                 DME Arranged: N/A DME Agency: NA       HH Arranged: NA HH Agency: NA         Social Determinants of Health (SDOH) Interventions    Readmission Risk Interventions No flowsheet data found.

## 2020-10-20 NOTE — Progress Notes (Signed)
Raymond Hood  ZOX:[id number] DOB: Sep 07, 1970 DOA: 10/15/2020 PCP: Gwenlyn Perking, MD    Brief Narrative:  50yo with a history of HTN, GERD, depression/anxiety, tobacco abuse, COPD, RA on Humira, schizoaffective disorder, morbid obesity, and homelessness who presented to the ED with 2 days of central twisting stabbing-like abdominal pain radiating into his back.  This was associated with 4 days of watery diarrhea.  In the ED CT abdomen/pelvis noted a right renal mass and bilateral adrenal gland enlargement (chronic issues followed at [facility]).  Significant Events:  10/6 admit via ED  Consultants:  Urology  Code Status: FULL CODE  Antimicrobials:  Azithromycin 10/6 >  DVT prophylaxis: Subcu heparin  Subjective: No acute events reported overnight.  Saturations remained at 90% or greater when working with OT yesterday on room air.  Afebrile.  Vital signs stable.  Tachycardia improved.  Is frequently not cooperative with nursing or therapy care.  Seems to have very little motivation to participate in his own care.  Assessment & Plan:  Abdominal pain with diarrhea Idiopathic -lipase normal -COVID-negative -no clinical evidence to suggest acute infection and no significant risk factors for C. difficile colitis -diarrhea has spontaneously resolved  Elevated d-dimer  obesity and sedentary lifestyle put him at risk for DVT/PE - venous duplex negative for lower extremity DVT - CTa chest to rule out PE pending  Chronic B LE edema / abdom wall edema  Noted on physical exam - TTE w/o evidence of CHF - suspect this is due to body habitus/obesity and venous stasis   Sinus tachycardia Likely due to diuresis, as well as signif deconditioning - PT/OT evals - PE in differential as noted above - HR improving w/ d/c of diuretic   Right renal mass A chronic issue that has been evaluated and is being followed at [facility] - no further investigation required this admission  Newly diagnoses DM2 w/  Hyperglycemia No known prior diagnosis of DM - A1c 7.3 - SSI for now - education ongoing - outpt monitoring will be difficult due to homelessness - pt exhibiting little motivation to participate in his care - suspect dietary and medication compliance will be quite difficult -patient is reportedly connected to a full complement of medical care through [facility] in [place]  Schizoaffective disorder Continue usual home medical therapies - clearly interfering with him participating in his own care   Tobacco abuse Utilize nicotine patch - counseled on advisability of discontinuing smoking  HTN Admits that he has not been taking Norvasc -blood pressure reasonably controlled without medical therapy at present - add ACEi in setting of new DM  COPD Patient reports being chronically short of breath - exam at admission consistent with mild bronchospastic exacerbation - continue bronchodilators -stopped systemic steroid and transitioned to inhaled steroid -diuresis did not prove helpful - now well compensated -saturations remained stable at 88% or greater even with physical exertion  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  RA Receives Humira injections every 14 days -followed by rheumatologist at [facility] Communication: No family present - patient states he has no family in the area Status is: Inpatient  Remains inpatient appropriate because:Inpatient level of care appropriate due to severity of illness  Dispo: The patient is from: Home  Anticipated d/c is to:  Unclear              Patient currently is not medically stable to d/c.   Difficult to place patient Yes   Objective: Blood pressure (!) 117/96, pulse 96, temperature 97.8 F (36.6 C), resp. rate 20, height  5\' 8"  (1.727 m), weight (!) 167.8 kg, SpO2 91 %.  Intake/Output Summary (Last 24 hours) at 10/20/2020 0925 Last data filed at 10/20/2020 0343 Gross per 24 hour  Intake 480 ml  Output 875 ml  Net -395 ml    Filed Weights   10/14/20 2012  Weight: (!) 167.8 kg    Examination: General: No acute respiratory distress  Lungs: Distant breath sounds in all fields due to body habitus with no wheezing Cardiovascular: RRR without murmur or rub Abdomen: Obese, soft, bowel sounds positive, no rebound, no appreciable mass Extremities: 1+ bilateral lower extremity edema without erythema or cyanosis  CBC: Recent Labs  Lab 10/14/20 2014 10/16/20 0336 10/19/20 0416  WBC 9.4 11.6* 10.5  HGB 16.4 15.1 16.2  HCT 48.7 48.2 51.9  MCV 91.2 90.4 90.9  PLT 191 207 203    Basic Metabolic Panel: Recent Labs  Lab 10/18/20 0413 10/19/20 0416 10/20/20 0520  NA 139 136 136  K 3.5 4.1 4.4  CL 93* 91* 93*  CO2 37* 36* 33*  GLUCOSE 131* 169* 176*  BUN 21* 26* 26*  CREATININE 0.97 0.97 0.94  CALCIUM 8.6* 8.6* 8.3*  MG 2.5* 2.5*  --     GFR: Estimated Creatinine Clearance: 148.7 mL/min (by C-G formula based on SCr of 0.94 mg/dL).  Liver Function Tests: Recent Labs  Lab 10/14/20 2050 10/19/20 0416  AST 19 21  ALT 23 24  ALKPHOS 76 68  BILITOT 0.6 1.0  PROT 7.1 7.4  ALBUMIN 3.4* 3.6    Recent Labs  Lab 10/14/20 2050  LIPASE 36     Coagulation Profile: Recent Labs  Lab 10/15/20 0824  INR 1.0     HbA1C: Hgb A1c MFr Bld  Date/Time Value Ref Range Status  10/16/2020 03:36 AM 7.3 (H) 4.8 - 5.6 % Final    Comment:    (NOTE)         Prediabetes: 5.7 - 6.4         Diabetes: >6.4         Glycemic control for adults with diabetes: <7.0     CBG: Recent Labs  Lab 10/19/20 0819 10/19/20 1147 10/19/20 1608 10/19/20 2057 10/20/20 0852  GLUCAP 204* 171* 218* 149* 161*     Recent Results (from the past 240 hour(s))  Resp Panel by RT-PCR (Flu A&B, Covid)  Nasopharyngeal Swab     Status: None   Collection Time: 10/15/20  4:39 AM   Specimen: Nasopharyngeal Swab; Nasopharyngeal(NP) swabs in vial transport medium  Result Value Ref Range Status   SARS Coronavirus 2 by RT PCR NEGATIVE NEGATIVE Final    Comment: (NOTE) SARS-CoV-2 target nucleic acids are NOT DETECTED.  The SARS-CoV-2 RNA is generally detectable in upper respiratory specimens during the acute phase of infection. The lowest concentration of SARS-CoV-2 viral copies this assay can detect is 138 copies/mL. A negative result does not preclude SARS-Cov-2 infection and should not be used as the sole basis for treatment or other patient management decisions. A negative result may occur with  improper specimen collection/handling, submission of specimen other than nasopharyngeal swab, presence of viral mutation(s) within the areas targeted by this assay, and inadequate number  of viral copies(<138 copies/mL). A negative result must be combined with clinical observations, patient history, and epidemiological information. The expected result is Negative.  Fact Sheet for Patients:  BloggerCourse.com  Fact Sheet for Healthcare Providers:  SeriousBroker.it  This test is no t yet approved or cleared by the [place] FDA and  has been authorized for detection and/or diagnosis of SARS-CoV-2 by FDA under an Emergency Use Authorization (EUA). This EUA will remain  in effect (meaning this test can be used) for the duration of the COVID-19 declaration under Section 564(b)(1) of the Act, 21 U.S.C.section 360bbb-3(b)(1), unless the authorization is terminated  or revoked sooner.       Influenza A by PCR NEGATIVE NEGATIVE Final   Influenza B by PCR NEGATIVE NEGATIVE Final    Comment: (NOTE) The Xpert Xpress SARS-CoV-2/FLU/RSV plus assay is intended as an aid in the diagnosis of influenza from Nasopharyngeal swab specimens and should not be  used as a sole basis for treatment. Nasal washings and aspirates are unacceptable for Xpert Xpress SARS-CoV-2/FLU/RSV testing.  Fact Sheet for Patients: BloggerCourse.com  Fact Sheet for Healthcare Providers: SeriousBroker.it  This test is not yet approved or cleared by the [place] FDA and has been authorized for detection and/or diagnosis of SARS-CoV-2 by FDA under an Emergency Use Authorization (EUA). This EUA will remain in effect (meaning this test can be used) for the duration of the COVID-19 declaration under Section 564(b)(1) of the Act, 21 U.S.C. section 360bbb-3(b)(1), unless the authorization is terminated or revoked.  Performed at [facility], [address] [place], [place] [address]       Scheduled Meds:  DULoxetine  60 mg Oral Daily   enoxaparin (LOVENOX) injection  0.5 mg/kg Subcutaneous Q24H   famotidine  20 mg Oral BID   fluticasone furoate-vilanterol  1 puff Inhalation Daily   insulin aspart  0-15 Units Subcutaneous TID WC   insulin aspart  0-5 Units Subcutaneous QHS   insulin aspart  3 Units Subcutaneous TID WC   metFORMIN  500 mg Oral BID WC   nicotine  21 mg Transdermal Daily   risperidone  4 mg Oral Daily   tamsulosin  0.4 mg Oral Daily   tiotropium  18 mcg Inhalation Daily      LOS: 5 days   Lonia Blood, MD [facility] Office  [phone] Pager - Text Page per Loretha Stapler  If 7PM-7AM, please contact night-coverage per Amion 10/20/2020, 9:25 AM

## 2020-10-21 ENCOUNTER — Other Ambulatory Visit: Payer: Self-pay

## 2020-10-21 DIAGNOSIS — N2889 Other specified disorders of kidney and ureter: Principal | ICD-10-CM

## 2020-10-21 LAB — GLUCOSE, CAPILLARY
Glucose-Capillary: 172 mg/dL — ABNORMAL HIGH (ref 70–99)
Glucose-Capillary: 145 mg/dL — ABNORMAL HIGH (ref 70–99)
Glucose-Capillary: 170 mg/dL — ABNORMAL HIGH (ref 70–99)

## 2020-10-21 MED ORDER — RISPERIDONE 4 MG PO TABS
4.0000 mg | ORAL_TABLET | Freq: Every day | ORAL | 0 refills | Status: DC
  Filled 2020-10-21: qty 30, 30d supply, fill #0

## 2020-10-21 MED ORDER — DULOXETINE HCL 60 MG PO CPEP
60.0000 mg | ORAL_CAPSULE | Freq: Every day | ORAL | 0 refills | Status: DC
  Filled 2020-10-21: qty 30, 30d supply, fill #0

## 2020-10-21 MED ORDER — TRELEGY ELLIPTA 100-62.5-25 MCG/INH IN AEPB
1.0000 | INHALATION_SPRAY | Freq: Every day | RESPIRATORY_TRACT | 0 refills | Status: DC
  Filled 2020-10-21: qty 30, 30d supply, fill #0

## 2020-10-21 MED ORDER — AMLODIPINE BESYLATE 5 MG PO TABS
5.0000 mg | ORAL_TABLET | Freq: Every day | ORAL | 0 refills | Status: DC
  Filled 2020-10-21: qty 30, 30d supply, fill #0

## 2020-10-21 MED ORDER — FAMOTIDINE 20 MG PO TABS
20.0000 mg | ORAL_TABLET | Freq: Two times a day (BID) | ORAL | 0 refills | Status: DC
  Filled 2020-10-21: qty 30, 15d supply, fill #0

## 2020-10-21 MED ORDER — ADALIMUMAB 40 MG/0.4ML ~~LOC~~ PSKT
40.0000 mg | PREFILLED_SYRINGE | SUBCUTANEOUS | 0 refills | Status: DC
  Filled 2020-10-21: qty 2, fill #0

## 2020-10-21 MED ORDER — METFORMIN HCL 500 MG PO TABS
500.0000 mg | ORAL_TABLET | Freq: Two times a day (BID) | ORAL | 0 refills | Status: DC
  Filled 2020-10-21: qty 60, 30d supply, fill #0

## 2020-10-21 NOTE — Progress Notes (Signed)
AVS given and reviewed with pt. Medications delivered to bedside and discussed with pt. Taxi voucher provided by social work. All questions answered to satisfaction. Pt verbalized understanding of information given. Pt escorted off the unit with all belongings via wheelchair by staff member.

## 2020-10-21 NOTE — Progress Notes (Signed)
Physical Therapy Treatment Patient Details Name: Raymond Hood MRN: [id number] DOB: 1970-07-05 Today's Date: 10/21/2020   History of Present Illness 50 y/o male presenting to ED with abdominal pain x2 days described as twisting/stabbing and radiating into his back. Medical history significant for HTN, GERD, depression/anxiety, tobacco abuse, COPD, RA on Humira, schizoaffective disorder, morbid obesity. Pt is homeless.    PT Comments    Pt alert in bed requiring significant encouragement for session participation. PT is overall annoyed with PT presence but agreeable to session. Pt able to come to EOB x 1 w/ MIN G after initially requesting assist for BLE w/ MIN-A. Pt instructed on rolling and technique to management trunk for upright support. Pt does need slight assist to stabilize once upright. Tested O2 needs w/ amb this session w/ pt initially on room air. SpO2 levels maintained 89-93% without AD during ambulation with good plinth reading. No rest breaks w/ amb this session as pt was highly motivated to return to the room. HHPT remains d/c recommendation. Skilled PT intervention is indicated to address deficits in function, mobility, and to return to PLOF as able.    Recommendations for follow up therapy are one component of a multi-disciplinary discharge planning process, led by the attending physician.  Recommendations may be updated based on patient status, additional functional criteria and insurance authorization.  Follow Up Recommendations  Home health PT     Equipment Recommendations  None recommended by PT    Recommendations for Other Services       Precautions / Restrictions Precautions Precautions: Fall Restrictions Weight Bearing Restrictions: No     Mobility  Bed Mobility Overal bed mobility: Needs Assistance Bed Mobility: Supine to Sit;Sit to Supine     Supine to sit: Min assist Sit to supine: Supervision   General bed mobility comments: Supine > sit: MIN A x 1  to come EOB; MIN-G x 1 w/ cues and pt encouragement    Transfers Overall transfer level: Needs assistance Equipment used: None Transfers: Sit to/from Stand Sit to Stand: Min guard         General transfer comment: MIN-G for safety  Ambulation/Gait Ambulation/Gait assistance: Min guard Gait Distance (Feet): 90 Feet Assistive device: None Gait Pattern/deviations: Step-through pattern;Decreased stride length;Trunk flexed;Wide base of support Gait velocity: Decreased   General Gait Details: Decreased cadence w/ wide BOS but no LOB. Pt did not require standing rest breaks w/ SpO2 89-90%. Pt is highly motivated to return to bed and denied further amb.   Stairs             Wheelchair Mobility    Modified Rankin (Stroke Patients Only)       Balance Overall balance assessment: Needs assistance Sitting-balance support: Feet supported Sitting balance-Leahy Scale: Fair Sitting balance - Comments: Does not require UE support but can not tolerate large changes with some posterior trunk lean w/ scooting   Standing balance support: During functional activity Standing balance-Leahy Scale: Fair Standing balance comment: No LOB noted but requires increase in BOS through LE for balance                            Cognition Arousal/Alertness: Awake/alert Behavior During Therapy: Flat affect Overall Cognitive Status: Within Functional Limits for tasks assessed                                 General Comments:  oriented to person, place; disoriented to month, situation; follows one step commands with reluctuance and requires increased processing time      Exercises      General Comments General comments (skin integrity, edema, etc.): Pt remained on RA throughout treatment      Pertinent Vitals/Pain Pain Assessment: 0-10 Pain Score: 0-No pain    Home Living                      Prior Function            PT Goals (current goals can  now be found in the care plan section) Progress towards PT goals: Progressing toward goals    Frequency    Min 2X/week      PT Plan Current plan remains appropriate    Co-evaluation              AM-PAC PT "6 Clicks" Mobility   Outcome Measure  Help needed turning from your back to your side while in a flat bed without using bedrails?: A Little Help needed moving from lying on your back to sitting on the side of a flat bed without using bedrails?: A Little Help needed moving to and from a bed to a chair (including a wheelchair)?: A Little Help needed standing up from a chair using your arms (e.g., wheelchair or bedside chair)?: A Little Help needed to walk in hospital room?: A Little Help needed climbing 3-5 steps with a railing? : A Lot 6 Click Score: 17    End of Session Equipment Utilized During Treatment: Gait belt Activity Tolerance: Patient tolerated treatment well Patient left: in bed;with call bell/phone within reach Nurse Communication: Mobility status PT Visit Diagnosis: Unsteadiness on feet (R26.81);Muscle weakness (generalized) (M62.81);Difficulty in walking, not elsewhere classified (R26.2)     Time: 6283-6629 PT Time Calculation (min) (ACUTE ONLY): 24 min  Charges:                        [employer], SPT

## 2020-10-21 NOTE — Plan of Care (Signed)
  Problem: Education: Goal: Knowledge of General Education information will improve Description: Including pain rating scale, medication(s)/side effects and non-pharmacologic comfort measures Outcome: Progressing   Problem: Health Behavior/Discharge Planning: Goal: Ability to manage health-related needs will improve Outcome: Progressing   Problem: Clinical Measurements: Goal: Ability to maintain clinical measurements within normal limits will improve Outcome: Progressing   Problem: Activity: Goal: Risk for activity intolerance will decrease Outcome: Progressing   Problem: Coping: Goal: Level of anxiety will decrease Outcome: Progressing   Problem: Pain Managment: Goal: General experience of comfort will improve Outcome: Progressing   Problem: Safety: Goal: Ability to remain free from injury will improve Outcome: Progressing   

## 2020-10-21 NOTE — Discharge Summary (Addendum)
Raymond Hood ZOX:[id number] DOB: 1970/03/04 DOA: 10/15/2020  PCP: Gwenlyn Perking, MD  Admit date: 10/15/2020 Discharge date: 10/21/2020  Time spent: 40  minutes  Recommendations for Outpatient Follow-up:  Close PCP f/u     Discharge Diagnoses:  Principal Problem:   Renal mass, right Active Problems:   Schizo affective schizophrenia (HCC)   Depression with anxiety   Tobacco abuse   HTN (hypertension)   COPD (chronic obstructive pulmonary disease) (HCC)   Diarrhea   Morbid obesity with BMI of 50.0-59.9, adult (HCC)   Homeless   Rheumatoid arthritis (HCC)   Abdominal pain   Discharge Condition: stable  Diet recommendation: heart healthy  Filed Weights   10/14/20 2012  Weight: (!) 167.8 kg    History of present illness:   Raymond Hood is a 50 y.o. male with medical history significant of HTN, GERD, depression with anxiety, tobacco abuse, COPD, rheumatoid arthritis on Humira, schizoaffective schizophrenia, homeless, morbid obesity, who presents with abdominal pain.   Patient states that his abdominal pain has been going on for 2 days, which is located in the central abdomen, twisting stabbing-like pain, moderate to severe, radiating to the back.  He reports diarrhea which has been going on for 4 days, approximately 3 times of watery diarrhea each day.  No nausea or vomiting.  Patient has chronic shortness of breath and a cough due to COPD, with a little mucus production.  Denies chest pain. No fever or chills.  He states that sometimes he has a dysuria, denies burning on urination or increased urinary frequency.  No hematuria.    Hospital Course:  Abdominal pain with diarrhea Idiopathic -lipase normal -COVID-negative -no clinical evidence to suggest acute infection and no significant risk factors for C. difficile colitis -diarrhea has spontaneously resolved   Elevated d-dimer  obesity and sedentary lifestyle put him at risk for DVT/PE - venous duplex negative for lower  extremity DVT - CTa chest negative for PE   Right renal mass A chronic issue that has been evaluated and is being followed at [facility] - no further investigation required this admission   Newly diagnoses DM2 w/ Hyperglycemia No known prior diagnosis of DM - A1c 7.3 -  - metformin started  Schizoaffective disorder Continue usual home medical therapies - clearly interfering with him participating in his own care. Has outpt Child psychotherapist. TOC has explored shelter options and apparently there are none  COPD At baseline   Probable OHS/SA Observed to desaturate into the mid 80s while sleeping -consider outpatient sleep study once patient is able to acquire a more stable living environment which would facilitate CPAP prescription    RA Receives Humira injections every 14 days -followed by rheumatologist at [facility]      Procedures: none   Consultations: none  Discharge Exam: Vitals:   10/21/20 0513 10/21/20 0736  BP: 138/79 (!) 149/89  Pulse: 98 (!) 104  Resp: 16 18  Temp: 98.1 F (36.7 C) 98.8 F (37.1 C)  SpO2: 90% 91%    General: NAD Cardiovascular: RRR, no murmur Respiratory: scattered exp wheeze Ext: 1+ LE edema Abdomen: soft, obese, non-tender  Discharge Instructions   Discharge Instructions     Diet - low sodium heart healthy   Complete by: As directed    Increase activity slowly   Complete by: As directed       Allergies as of 10/21/2020   No Known Allergies      Medication List     STOP taking these medications  tamsulosin 0.4 MG Caps capsule Commonly known as: FLOMAX       TAKE these medications    Adalimumab 40 MG/0.4ML Pskt Inject 40 mg into the skin every 14 (fourteen) days.   amLODipine 5 MG tablet Commonly known as: NORVASC Take 1 tablet (5 mg total) by mouth daily.   DULoxetine 60 MG capsule Commonly known as: CYMBALTA Take 1 capsule (60 mg total) by mouth daily.   famotidine 20 MG tablet Commonly known as: PEPCID Take 1  tablet (20 mg total) by mouth 2 (two) times daily.   hydrOXYzine 25 MG tablet Commonly known as: ATARAX/VISTARIL Take 25 mg by mouth 3 (three) times daily as needed for anxiety or sleep.   metFORMIN 500 MG tablet Commonly known as: GLUCOPHAGE Take 1 tablet (500 mg total) by mouth 2 (two) times daily with a meal.   risperidone 4 MG tablet Commonly known as: RISPERDAL Take 1 tablet (4 mg total) by mouth daily.   Trelegy Ellipta 100-62.5-25 MCG/INH Aepb Generic drug: Fluticasone-Umeclidin-Vilant Inhale 1 puff into the lungs daily.       No Known Allergies  Follow-up Information     Gwenlyn Perking, MD Follow up.   Specialty: Internal Medicine Contact information: [address] [place] [address] [phone]                  The results of significant diagnostics from this hospitalization (including imaging, microbiology, ancillary and laboratory) are listed below for reference.    Significant Diagnostic Studies: CT Angio Chest Pulmonary Embolism (PE) W or WO Contrast  Result Date: 10/20/2020 CLINICAL DATA:  PE suspected, low/intermediate prob, positive D-dimer. EXAM: CT ANGIOGRAPHY CHEST WITH CONTRAST TECHNIQUE: Multidetector CT imaging of the chest was performed using the standard protocol during bolus administration of intravenous contrast. Multiplanar CT image reconstructions and MIPs were obtained to evaluate the vascular anatomy. CONTRAST:  OMNIPAQUE IOHEXOL 350 MG/ML SOLN COMPARISON:  None. FINDINGS: Cardiovascular: No filling defects in the pulmonary arteries to suggest pulmonary emboli. Heart is normal size. Aorta is normal caliber. Mediastinum/Nodes: No mediastinal, hilar, or axillary adenopathy. Trachea and esophagus are unremarkable. Thyroid unremarkable. Lungs/Pleura: No confluent opacities or effusions. Minimal right basilar atelectasis. Upper Abdomen: Diffuse low-density throughout the liver compatible with fatty infiltration. Bilateral  low-density adrenal lesions as seen on prior abdominal CT. Musculoskeletal: Chest wall soft tissues are unremarkable. No acute bony abnormality. Review of the MIP images confirms the above findings. IMPRESSION: No evidence of pulmonary embolus. No acute cardiopulmonary disease. Hepatic steatosis. Electronically Signed   By: Charlett Nose M.D.   On: 10/20/2020 15:48   CT Abdomen Pelvis W Contrast  Result Date: 10/15/2020 CLINICAL DATA:  50 year old male with right lower quadrant abdominal pain, umbilical pain beginning 2 days ago. Diarrhea. EXAM: CT ABDOMEN AND PELVIS WITH CONTRAST TECHNIQUE: Multidetector CT imaging of the abdomen and pelvis was performed using the standard protocol following bolus administration of intravenous contrast. CONTRAST:  OMNIPAQUE IOHEXOL 350 MG/ML SOLN COMPARISON:  None. FINDINGS: Lower chest: Cardiac size at the upper limits of normal. Mild atelectasis and crowding of lung markings. No pericardial or pleural effusion. Hepatobiliary: Hepatic steatosis. Fatty sparing near the gallbladder fossa. Negative gallbladder. No bile duct enlargement. Pancreas: Negative. Spleen: Negative. Adrenals/Urinary Tract: Lobular bilateral adrenal gland enlargement with low to intermediate density internally is indeterminate but favored due to benign adenoma. The left adrenal gland measures up to 4.2 cm. The right measures up to 3.8 cm. There is a solid renal  mass arising exophytically from the right lower pole best seen on coronal image 68. This measures 3-3.5 cm diameter. There are several superimposed simple fluid density benign exophytic right renal cysts, the largest on series 2, image 44. Superimposed nephrolithiasis also. But no right hydronephrosis. No right hydroureter. Outside of the right renal mass the kidneys enhance and excrete contrast symmetrically. Left nephrolithiasis and small benign appearing left renal cysts. No left hydronephrosis. Negative left ureter. Unremarkable bladder.  Stomach/Bowel: Intermittent diverticulosis in the large bowel, most pronounced in the sigmoid colon. No large bowel inflammation identified, and the appendix remains normal on coronal image 50. Small volume of oral contrast in the small bowel has not yet reached the ileocecal valve. But terminal ileum and small bowel loops appear unremarkable. No dilated bowel. Stomach and duodenum are within normal limits. No free air. No free fluid or mesenteric inflammation identified. Vascular/Lymphatic: Major vascular structures in the abdomen and pelvis appear to be patent. Mild Aortoiliac calcified atherosclerosis. No lymphadenopathy identified. Reproductive: Negative. Other: No pelvic free fluid. Musculoskeletal: Degenerative changes in the spine and at both SI joints. No acute osseous abnormality identified. IMPRESSION: 1. Solid right renal mass measuring up to 3.4 cm diameter and arising exophytically from the lower pole region is Renal Cell Carcinoma until proven otherwise. Recommend Urology consultation. 2. Bilateral adrenal gland enlargement, indeterminate for metastatic disease in the setting of #1, although bilateral benign adrenal adenoma is possible. And no other metastatic disease is identified in the abdomen or pelvis. 3. Superimposed bilateral nephrolithiasis. But no obstructive uropathy. Hepatic steatosis. Normal appendix. Diverticulosis of the large bowel without active inflammation. Normal appendix. Electronically Signed   By: Odessa Fleming M.D.   On: 10/15/2020 04:24   US Venous Img Lower Bilateral (DVT)  Result Date: 10/20/2020 CLINICAL DATA:  Bilateral leg pain and edema EXAM: BILATERAL LOWER EXTREMITY VENOUS DOPPLER ULTRASOUND TECHNIQUE: Gray-scale sonography with compression, as well as color and duplex ultrasound, were performed to evaluate the deep venous system(s) from the level of the common femoral vein through the popliteal and proximal calf veins. COMPARISON:  None. FINDINGS: VENOUS Normal  compressibility of the common femoral, superficial femoral, and popliteal veins, as well as the visualized calf veins. Visualized portions of profunda femoral vein and great saphenous vein unremarkable. No filling defects to suggest DVT on grayscale or color Doppler imaging. Doppler waveforms show normal direction of venous flow, normal respiratory plasticity and response to augmentation. OTHER None. Limitations: none IMPRESSION: No lower extremity DVT. Electronically Signed   By: Acquanetta Belling M.D.   On: 10/20/2020 10:59   DG Chest Port 1 View  Result Date: 10/15/2020 CLINICAL DATA:  50 year old male with abdominal pain for 2 days. Hypoxia. EXAM: PORTABLE CHEST 1 VIEW COMPARISON:  CT Abdomen and Pelvis 0401 hours today. FINDINGS: Portable AP upright view at 0443 hours. Somewhat low lung volumes with mild crowding of lung markings as seen on the CT earlier today. Normal cardiac size and mediastinal contours. Visualized tracheal air column is within normal limits. No pneumothorax, pleural effusion, pulmonary edema, or confluent pulmonary opacity. No acute osseous abnormality identified. IMPRESSION: Low lung volumes with mild atelectasis. Electronically Signed   By: Odessa Fleming M.D.   On: 10/15/2020 05:00   ECHOCARDIOGRAM COMPLETE  Result Date: 10/18/2020    ECHOCARDIOGRAM REPORT   Patient Name:   Raymond Hood Date of Exam: 10/18/2020 Medical Rec #:  [id number]     Height:       68.0 in Accession #:  [id number]    Weight:       370.0 lb Date of Birth:  10-Nov-1970     BSA:          2.654 m Patient Age:    49 years      BP:           138/90 mmHg Patient Gender: M             HR:           106 bpm. Exam Location:  [facility] Procedure: 2D Echo, Color Doppler, Cardiac Doppler and Intracardiac            Opacification Agent Indications:     Pulmonary edema  History:         Patient has no prior history of Echocardiogram examinations.                  COPD; Risk Factors:Hypertension and Current Smoker.  Sonographer:     Humphrey Rolls Referring Phys:  [id number] JEFFREY T [facility] Diagnosing Phys: Debbe Odea MD  Sonographer Comments: Technically difficult study due to poor echo windows. Image acquisition challenging due to patient body habitus and Image acquisition challenging due to COPD. IMPRESSIONS  1. Left ventricular ejection fraction, by estimation, is 55 to 60%. Left ventricular ejection fraction by 2D MOD biplane is 57.3 %. The left ventricle has normal function. The left ventricle has no regional wall motion abnormalities. Left ventricular diastolic parameters are consistent with Grade I diastolic dysfunction (impaired relaxation).  2. Right ventricular systolic function is normal. The right ventricular size is mildly enlarged.  3. The mitral valve is normal in structure. No evidence of mitral valve regurgitation.  4. The aortic valve was not well visualized. Aortic valve regurgitation is not visualized. FINDINGS  Left Ventricle: Left ventricular ejection fraction, by estimation, is 55 to 60%. Left ventricular ejection fraction by 2D MOD biplane is 57.3 %. The left ventricle has normal function. The left ventricle has no regional wall motion abnormalities. Definity contrast agent was given IV to delineate the left ventricular endocardial borders. The left ventricular internal cavity size was normal in size. There is no left ventricular hypertrophy. Left ventricular diastolic parameters are consistent with Grade I diastolic dysfunction (impaired relaxation). Right Ventricle: The right ventricular size is mildly enlarged. No increase in right ventricular wall thickness. Right ventricular systolic function is normal. Left Atrium: Left atrial size was normal in size. Right Atrium: Right atrial size was normal in size. Pericardium: There is no evidence of pericardial effusion. Mitral Valve: The mitral valve is normal in structure. No evidence of mitral valve regurgitation. MV peak gradient, 3.5 mmHg. The mean mitral valve gradient is  1.0 mmHg. Tricuspid Valve: The tricuspid valve is normal in structure. Tricuspid valve regurgitation is not demonstrated. Aortic Valve: The aortic valve was not well visualized. Aortic valve regurgitation is not visualized. Aortic valve mean gradient measures 4.0 mmHg. Aortic valve peak gradient measures 6.1 mmHg. Aortic valve area, by VTI measures 2.66 cm. Pulmonic Valve: The pulmonic valve was normal in structure. Pulmonic valve regurgitation is not visualized. Aorta: The aortic root and ascending aorta are structurally normal, with no evidence of dilitation. Venous: The inferior vena cava was not well visualized. IAS/Shunts: No atrial level shunt detected by color flow Doppler.  LEFT VENTRICLE PLAX 2D                        Biplane EF (MOD) LVIDd:  4.20 cm         LV Biplane EF:   Left LVIDs:         3.80 cm                          ventricular LV PW:         1.50 cm                          ejection LV IVS:        1.10 cm                          fraction by LVOT diam:     2.10 cm                          2D MOD LV SV:         46                               biplane is LV SV Index:   17                               57.3 %. LVOT Area:     3.46 cm                                Diastology                                LV e' medial:    12.20 cm/s LV Volumes (MOD)               LV E/e' medial:  4.4 LV vol d, MOD    53.2 ml       LV e' lateral:   8.49 cm/s A2C:                           LV E/e' lateral: 6.4 LV vol d, MOD    101.0 ml A4C: LV vol s, MOD    25.0 ml A2C: LV vol s, MOD    37.5 ml A4C: LV SV MOD A2C:   28.2 ml LV SV MOD A4C:   101.0 ml LV SV MOD BP:    43.3 ml RIGHT VENTRICLE RV Basal diam:  4.50 cm RV Mid diam:    3.90 cm LEFT ATRIUM           Index LA diam:      3.20 cm 1.21 cm/m LA Vol (A4C): 36.6 ml 13.79 ml/m  AORTIC VALVE                    PULMONIC VALVE AV Area (Vmax):    2.93 cm     PV Vmax:       0.78 m/s AV Area (Vmean):   2.51 cm     PV Vmean:      54.500 cm/s AV Area (VTI):      2.66 cm     PV VTI:        0.106 m AV Vmax:  123.00 cm/s  PV Peak grad:  2.5 mmHg AV Vmean:          93.000 cm/s  PV Mean grad:  1.0 mmHg AV VTI:            0.172 m AV Peak Grad:      6.1 mmHg AV Mean Grad:      4.0 mmHg LVOT Vmax:         104.00 cm/s LVOT Vmean:        67.500 cm/s LVOT VTI:          0.132 m LVOT/AV VTI ratio: 0.77  AORTA Ao Root diam: 3.70 cm MITRAL VALVE MV Area (PHT): 8.07 cm    SHUNTS MV Area VTI:   2.95 cm    Systemic VTI:  0.13 m MV Peak grad:  3.5 mmHg    Systemic Diam: 2.10 cm MV Mean grad:  1.0 mmHg MV Vmax:       0.93 m/s MV Vmean:      42.8 cm/s MV Decel Time: 94 msec MV E velocity: 54.00 cm/s MV A velocity: 72.40 cm/s MV E/A ratio:  0.75 Debbe Odea MD Electronically signed by Debbe Odea MD Signature Date/Time: 10/18/2020/12:37:26 PM    Final     Microbiology: Recent Results (from the past 240 hour(s))  Resp Panel by RT-PCR (Flu A&B, Covid) Nasopharyngeal Swab     Status: None   Collection Time: 10/15/20  4:39 AM   Specimen: Nasopharyngeal Swab; Nasopharyngeal(NP) swabs in vial transport medium  Result Value Ref Range Status   SARS Coronavirus 2 by RT PCR NEGATIVE NEGATIVE Final    Comment: (NOTE) SARS-CoV-2 target nucleic acids are NOT DETECTED.  The SARS-CoV-2 RNA is generally detectable in upper respiratory specimens during the acute phase of infection. The lowest concentration of SARS-CoV-2 viral copies this assay can detect is 138 copies/mL. A negative result does not preclude SARS-Cov-2 infection and should not be used as the sole basis for treatment or other patient management decisions. A negative result may occur with  improper specimen collection/handling, submission of specimen other than nasopharyngeal swab, presence of viral mutation(s) within the areas targeted by this assay, and inadequate number of viral copies(<138 copies/mL). A negative result must be combined with clinical observations, patient history, and  epidemiological information. The expected result is Negative.  Fact Sheet for Patients:  BloggerCourse.com  Fact Sheet for Healthcare Providers:  SeriousBroker.it  This test is no t yet approved or cleared by the [place] FDA and  has been authorized for detection and/or diagnosis of SARS-CoV-2 by FDA under an Emergency Use Authorization (EUA). This EUA will remain  in effect (meaning this test can be used) for the duration of the COVID-19 declaration under Section 564(b)(1) of the Act, 21 U.S.C.section 360bbb-3(b)(1), unless the authorization is terminated  or revoked sooner.       Influenza A by PCR NEGATIVE NEGATIVE Final   Influenza B by PCR NEGATIVE NEGATIVE Final    Comment: (NOTE) The Xpert Xpress SARS-CoV-2/FLU/RSV plus assay is intended as an aid in the diagnosis of influenza from Nasopharyngeal swab specimens and should not be used as a sole basis for treatment. Nasal washings and aspirates are unacceptable for Xpert Xpress SARS-CoV-2/FLU/RSV testing.  Fact Sheet for Patients: BloggerCourse.com  Fact Sheet for Healthcare Providers: SeriousBroker.it  This test is not yet approved or cleared by the [place] FDA and has been authorized for detection and/or diagnosis of SARS-CoV-2 by FDA under an Emergency Use Authorization (EUA). This  EUA will remain in effect (meaning this test can be used) for the duration of the COVID-19 declaration under Section 564(b)(1) of the Act, 21 U.S.C. section 360bbb-3(b)(1), unless the authorization is terminated or revoked.  Performed at [facility], [address] [place], [place] [address]      Labs: Basic Metabolic Panel: Recent Labs  Lab 10/14/20 2050 10/18/20 0413 10/19/20 0416 10/20/20 0520  NA 137 139 136 136  K 3.9 3.5 4.1 4.4  CL 99 93* 91* 93*  CO2 28 37* 36* 33*  GLUCOSE 148* 131* 169* 176*   BUN 16 21* 26* 26*  CREATININE 1.03 0.97 0.97 0.94  CALCIUM 8.4* 8.6* 8.6* 8.3*  MG  --  2.5* 2.5*  --    Liver Function Tests: Recent Labs  Lab 10/14/20 2050 10/19/20 0416  AST 19 21  ALT 23 24  ALKPHOS 76 68  BILITOT 0.6 1.0  PROT 7.1 7.4  ALBUMIN 3.4* 3.6   Recent Labs  Lab 10/14/20 2050  LIPASE 36   No results for input(s): AMMONIA in the last 168 hours. CBC: Recent Labs  Lab 10/14/20 2014 10/16/20 0336 10/19/20 0416  WBC 9.4 11.6* 10.5  HGB 16.4 15.1 16.2  HCT 48.7 48.2 51.9  MCV 91.2 90.4 90.9  PLT 191 207 203   Cardiac Enzymes: No results for input(s): CKTOTAL, CKMB, CKMBINDEX, TROPONINI in the last 168 hours. BNP: BNP (last 3 results) Recent Labs    10/15/20 0931  BNP 10.5    ProBNP (last 3 results) No results for input(s): PROBNP in the last 8760 hours.  CBG: Recent Labs  Lab 10/20/20 0852 10/20/20 1222 10/20/20 1658 10/20/20 2055 10/21/20 0749  GLUCAP 161* 138* 212* 156* 172*       Signed:  Silvano Bilis MD.  [facility] 10/21/2020, 10:47 AM

## 2020-10-21 NOTE — TOC Progression Note (Signed)
Transition of Care [facility]) - Progression Note    Patient Details  Name: Raymond Hood MRN: [id number] Date of Birth: [birth year]  [facility] CM/SW Contact  Maree Krabbe, LCSW Phone Number: 10/21/2020, 3:43 PM  Clinical Narrative:  Pt dc today. Pt is requesting a taxi voucher to the [employer] --provided. Pt also was provided with socks.     Expected Discharge Plan: Homeless Shelter Barriers to Discharge: Continued Medical Work up  Expected Discharge Plan and Services Expected Discharge Plan: Homeless Shelter   Discharge Planning Services: Medication Assistance, CM Consult, Other - See comment   Living arrangements for the past 2 months: Homeless Expected Discharge Date: 10/21/20               DME Arranged: N/A DME Agency: NA       HH Arranged: NA HH Agency: NA         Social Determinants of Health (SDOH) Interventions    Readmission Risk Interventions No flowsheet data found.

## 2020-10-23 ENCOUNTER — Other Ambulatory Visit: Payer: Self-pay

## 2020-10-23 ENCOUNTER — Emergency Department: Payer: Medicaid Other

## 2020-10-23 ENCOUNTER — Emergency Department
Admission: EM | Admit: 2020-10-23 | Discharge: 2020-10-23 | Disposition: A | Discharge status: Home / Self Care | Payer: Medicaid Other | Attending: Emergency Medicine | Admitting: Emergency Medicine

## 2020-10-23 ENCOUNTER — Encounter: Payer: Self-pay | Admitting: Emergency Medicine

## 2020-10-23 DIAGNOSIS — I10 Essential (primary) hypertension: Secondary | ICD-10-CM | POA: Insufficient documentation

## 2020-10-23 DIAGNOSIS — R0789 Other chest pain: Secondary | ICD-10-CM | POA: Insufficient documentation

## 2020-10-23 DIAGNOSIS — J45909 Unspecified asthma, uncomplicated: Secondary | ICD-10-CM | POA: Insufficient documentation

## 2020-10-23 DIAGNOSIS — Z7951 Long term (current) use of inhaled steroids: Secondary | ICD-10-CM | POA: Insufficient documentation

## 2020-10-23 DIAGNOSIS — Z79899 Other long term (current) drug therapy: Secondary | ICD-10-CM | POA: Insufficient documentation

## 2020-10-23 DIAGNOSIS — F17210 Nicotine dependence, cigarettes, uncomplicated: Secondary | ICD-10-CM | POA: Insufficient documentation

## 2020-10-23 DIAGNOSIS — J449 Chronic obstructive pulmonary disease, unspecified: Secondary | ICD-10-CM | POA: Insufficient documentation

## 2020-10-23 DIAGNOSIS — R079 Chest pain, unspecified: Secondary | ICD-10-CM

## 2020-10-23 LAB — CBC WITH DIFFERENTIAL/PLATELET
Abs Immature Granulocytes: 0.12 10*3/uL — ABNORMAL HIGH (ref 0.00–0.07)
Basophils Absolute: 0.1 10*3/uL (ref 0.0–0.1)
Basophils Relative: 1 %
Eosinophils Absolute: 0.4 10*3/uL (ref 0.0–0.5)
Eosinophils Relative: 4 %
HCT: 46.4 % (ref 39.0–52.0)
Hemoglobin: 14.9 g/dL (ref 13.0–17.0)
Immature Granulocytes: 1 %
Lymphocytes Relative: 26 %
Lymphs Abs: 2.5 10*3/uL (ref 0.7–4.0)
MCH: 29 pg (ref 26.0–34.0)
MCHC: 32.1 g/dL (ref 30.0–36.0)
MCV: 90.3 fL (ref 80.0–100.0)
Monocytes Absolute: 0.8 10*3/uL (ref 0.1–1.0)
Monocytes Relative: 8 %
Neutro Abs: 5.6 10*3/uL (ref 1.7–7.7)
Neutrophils Relative %: 60 %
Platelets: 208 10*3/uL (ref 150–400)
RBC: 5.14 MIL/uL (ref 4.22–5.81)
RDW: 14.1 % (ref 11.5–15.5)
WBC: 9.5 10*3/uL (ref 4.0–10.5)
nRBC: 0 % (ref 0.0–0.2)

## 2020-10-23 LAB — URINALYSIS, ROUTINE W REFLEX MICROSCOPIC
Bilirubin Urine: NEGATIVE
Glucose, UA: NEGATIVE mg/dL
Hgb urine dipstick: NEGATIVE
Ketones, ur: NEGATIVE mg/dL
Leukocytes,Ua: NEGATIVE
Nitrite: NEGATIVE
Protein, ur: NEGATIVE mg/dL
Specific Gravity, Urine: 1.018 (ref 1.005–1.030)
pH: 5 (ref 5.0–8.0)

## 2020-10-23 LAB — COMPREHENSIVE METABOLIC PANEL
ALT: 21 U/L (ref 0–44)
AST: 19 U/L (ref 15–41)
Albumin: 3.4 g/dL — ABNORMAL LOW (ref 3.5–5.0)
Alkaline Phosphatase: 64 U/L (ref 38–126)
Anion gap: 9 (ref 5–15)
BUN: 17 mg/dL (ref 6–20)
CO2: 29 mmol/L (ref 22–32)
Calcium: 8.3 mg/dL — ABNORMAL LOW (ref 8.9–10.3)
Chloride: 98 mmol/L (ref 98–111)
Creatinine, Ser: 0.99 mg/dL (ref 0.61–1.24)
GFR, Estimated: >60 mL/min (ref >60)
Glucose, Bld: 154 mg/dL — ABNORMAL HIGH (ref 70–99)
Potassium: 3.9 mmol/L (ref 3.5–5.1)
Sodium: 136 mmol/L (ref 135–145)
Total Bilirubin: 0.7 mg/dL (ref 0.3–1.2)
Total Protein: 6.7 g/dL (ref 6.5–8.1)

## 2020-10-23 LAB — TROPONIN I (HIGH SENSITIVITY)
Troponin I (High Sensitivity): 8 ng/L (ref <18)
Troponin I (High Sensitivity): 6 ng/L (ref <18)

## 2020-10-23 LAB — LIPASE, BLOOD: Lipase: 45 U/L (ref 11–51)

## 2020-10-23 MED ORDER — KETOROLAC TROMETHAMINE 30 MG/ML IJ SOLN
30.0000 mg | Freq: Once | INTRAMUSCULAR | Status: AC
  Administered 2020-10-23: 30 mg via INTRAMUSCULAR
  Filled 2020-10-23: qty 1

## 2020-10-23 MED ORDER — NAPROXEN 500 MG PO TABS: 500.0000 mg | ORAL_TABLET | Freq: Two times a day (BID) | ORAL | 0 refills | Status: DC

## 2020-10-23 NOTE — ED Triage Notes (Signed)
EMS brings pt in via w/c to triage with no distress noted; pt homeless; pt c/o left flank pain and left axillae pain that began PTA "when the rain began"; denies any other accomp symptoms

## 2020-10-23 NOTE — ED Provider Notes (Signed)
[facility] Emergency Department Provider Note   ____________________________________________   Event Date/Time   First MD Initiated Contact with Patient 10/23/20 [phone]     (approximate)  I have reviewed the triage vital signs and the nursing notes.   HISTORY  Chief Complaint Chest Pain    HPI Raymond Hood is a 50 y.o. male who presents to the ED via EMS with a chief complaint of left flank pain radiatng to left chest and axilla.  Started prior to arrival when the rain began.  Patient recently hospitalized for abdominal pain, diarrhea, hypoxia attributed to OSA.  Had negative CTA and DVT ultrasounds while hospitalized.  Denies fever, cough, shortness of breath, diaphoresis, abdominal pain, nausea, vomiting or dizziness     Past Medical History:  Diagnosis Date   Depression with anxiety    GERD (gastroesophageal reflux disease)    HTN (hypertension)    Rheumatoid arthritis (HCC)    Tobacco abuse     Patient Active Problem List   Diagnosis Date Noted   Renal mass 10/15/2020   Depression with anxiety 10/15/2020   GERD (gastroesophageal reflux disease) 10/15/2020   Tobacco abuse 10/15/2020   HTN (hypertension) 10/15/2020   Renal mass, right 10/15/2020   COPD (chronic obstructive pulmonary disease) (HCC) 10/15/2020   Diarrhea 10/15/2020   Morbid obesity with BMI of 50.0-59.9, adult (HCC) 10/15/2020   Homeless 10/15/2020   Rheumatoid arthritis (HCC) 10/15/2020   Abdominal pain 10/15/2020   MDD (major depressive disorder), recurrent episode (HCC) 09/28/2020   Anxiety 09/28/2020   Schizo affective schizophrenia (HCC) 09/28/2020   Prediabetes 11/29/2018   Cigarette smoker 07/11/2018   Asthma without status asthmaticus 03/27/2018   Psoriasis, unspecified 06/28/2012    Past Surgical History:  Procedure Laterality Date   left index finger surgery Left     Prior to Admission medications   Medication Sig Start Date End Date Taking? Authorizing  Provider  naproxen (NAPROSYN) 500 MG tablet Take 1 tablet (500 mg total) by mouth 2 (two) times daily with a meal. 10/23/20  Yes Irean Hong, MD  Adalimumab 40 MG/0.4ML PSKT Inject 40 mg into the skin once every 14 (fourteen) days. 10/21/20   Wouk, Wilfred Curtis, MD  amLODipine (NORVASC) 5 MG tablet Take 1 tablet (5 mg total) by mouth once daily. 10/21/20   Wouk, Wilfred Curtis, MD  DULoxetine (CYMBALTA) 60 MG capsule Take 1 capsule (60 mg total) by mouth once daily. 10/21/20   Wouk, Wilfred Curtis, MD  famotidine (PEPCID) 20 MG tablet Take 1 tablet (20 mg total) by mouth 2 (two) times daily. 10/21/20   Wouk, Wilfred Curtis, MD  Fluticasone-Umeclidin-Vilant (TRELEGY ELLIPTA) 100-62.5-25 MCG/INH AEPB Inhale 1 puff into the lungs once daily. 10/21/20   Wouk, Wilfred Curtis, MD  hydrOXYzine (ATARAX/VISTARIL) 25 MG tablet Take 25 mg by mouth 3 (three) times daily as needed for anxiety or sleep.    [provider]  metFORMIN (GLUCOPHAGE) 500 MG tablet Take 1 tablet (500 mg total) by mouth 2 (two) times daily with a meal. 10/21/20   Wouk, Wilfred Curtis, MD  risperidone (RISPERDAL) 4 MG tablet Take 1 tablet (4 mg total) by mouth once daily. 10/21/20   Wouk, Wilfred Curtis, MD    Allergies Patient has no known allergies.  Family History  Problem Relation Age of Onset   Schizophrenia Mother    Diabetes Mellitus II Father     Social History Social History   Tobacco Use   Smoking status: Every Day  Packs/day: 1.00    Types: Cigarettes   Smokeless tobacco: Never  Vaping Use   Vaping Use: Never used  Substance Use Topics   Alcohol use: Not Currently   Drug use: Never    Review of Systems  Constitutional: No fever/chills Eyes: No visual changes. ENT: No sore throat. Cardiovascular: Positive for chest pain. Respiratory: Denies shortness of breath. Gastrointestinal: Positive for left flank pain no abdominal pain.  No nausea, no vomiting.  No diarrhea.  No constipation. Genitourinary:  Negative for dysuria. Musculoskeletal: Negative for back pain. Skin: Negative for rash. Neurological: Negative for headaches, focal weakness or numbness.   ____________________________________________   PHYSICAL EXAM:  VITAL SIGNS: ED Triage Vitals  Enc Vitals Group     BP 10/23/20 0055 (!) 147/97     Pulse Rate 10/23/20 0055 96     Resp 10/23/20 0055 20     Temp 10/23/20 0055 97.9 F (36.6 C)     Temp Source 10/23/20 0055 Oral     SpO2 10/23/20 0050 97 %     Weight 10/23/20 0055 (!) 370 lb (167.8 kg)     Height 10/23/20 0055 5\' 8"  (1.727 m)     Head Circumference --      Peak Flow --      Pain Score 10/23/20 0054 9     Pain Loc --      Pain Edu? --      Excl. in GC? --     Constitutional: Asleep, awakened for exam.  Alert and oriented. Well appearing and in no acute distress. Eyes: Conjunctivae are normal. PERRL. EOMI. Head: Atraumatic. Nose: No congestion/rhinnorhea. Mouth/Throat: Mucous membranes are moist.   Neck: No stridor.   Cardiovascular: Normal rate, regular rhythm. Grossly normal heart sounds.  Good peripheral circulation. Respiratory: Normal respiratory effort.  No retractions. Lungs CTAB.  Left lateral ribs point tender to palpation.  No splinting.  No crepitus.  No external evidence of injury or bruising. Gastrointestinal: Obese.  Soft and nontender to light or deep palpation. No distention. No abdominal bruits. No CVA tenderness. Musculoskeletal: No lower extremity tenderness nor edema.  No joint effusions. Neurologic:  Normal speech and language. No gross focal neurologic deficits are appreciated. No gait instability. Skin:  Skin is warm, dry and intact. No rash noted. Psychiatric: Mood and affect are normal. Speech and behavior are normal.  ____________________________________________   LABS (all labs ordered are listed, but only abnormal results are displayed)  Labs Reviewed  CBC WITH DIFFERENTIAL/PLATELET - Abnormal; Notable for the following  components:      Result Value   Abs Immature Granulocytes 0.12 (*)    All other components within normal limits  COMPREHENSIVE METABOLIC PANEL - Abnormal; Notable for the following components:   Glucose, Bld 154 (*)    Calcium 8.3 (*)    Albumin 3.4 (*)    All other components within normal limits  URINALYSIS, ROUTINE W REFLEX MICROSCOPIC - Abnormal; Notable for the following components:   Color, Urine YELLOW (*)    APPearance CLEAR (*)    All other components within normal limits  LIPASE, BLOOD  TROPONIN I (HIGH SENSITIVITY)  TROPONIN I (HIGH SENSITIVITY)   ____________________________________________  EKG  ED ECG REPORT I, Culver Feighner J, the attending physician, personally viewed and interpreted this ECG.   Date: 10/23/2020  EKG Time: 0059  Rate: 94  Rhythm: normal sinus rhythm  Axis: Normal  Intervals:none  ST&T Change: Nonspecific  ____________________________________________  RADIOLOGY 10/25/2020, personally viewed and  evaluated these images (plain radiographs) as part of my medical decision making, as well as reviewing the written report by the radiologist.  ED MD interpretation: No acute cardiopulmonary process  Official radiology report(s): DG Chest Port 1 View  Result Date: 10/23/2020 CLINICAL DATA:  Chest pain EXAM: PORTABLE CHEST 1 VIEW COMPARISON:  10/15/2020 FINDINGS: The heart size and mediastinal contours are within normal limits. Both lungs are clear. The visualized skeletal structures are unremarkable. IMPRESSION: No active disease. Electronically Signed   By: Helyn Numbers M.D.   On: 10/23/2020 03:56    ____________________________________________   PROCEDURES  Procedure(s) performed (including Critical Care):  .1-3 Lead EKG Interpretation Performed by: Irean Hong, MD Authorized by: Irean Hong, MD     Interpretation: normal     ECG rate:  94   ECG rate assessment: normal     Rhythm: sinus rhythm     Ectopy: none     Conduction:  normal   Comments:     Patient placed on cardiac monitor to evaluate for arrhythmias   ____________________________________________   INITIAL IMPRESSION / ASSESSMENT AND PLAN / ED COURSE  As part of my medical decision making, I reviewed the following data within the electronic MEDICAL RECORD NUMBER Nursing notes reviewed and incorporated, Labs reviewed, EKG interpreted, Old chart reviewed, Radiograph reviewed, and Notes from prior ED visits     50 year old male presenting with chest pain. Differential diagnosis includes, but is not limited to, ACS, aortic dissection, pulmonary embolism, cardiac tamponade, pneumothorax, pneumonia, pericarditis, myocarditis, GI-related causes including esophagitis/gastritis, and musculoskeletal chest wall pain.     Initial troponin and EKG unremarkable.  We will repeat troponin, obtain chest x-ray.  Administer IM Toradol.  Patient had very recent CTA chest and Doppler ultrasounds which were negative for PE/DVT.  Low suspicion for ACS.  Will reassess.  Clinical Course as of 10/23/20 7858  Fri Oct 23, 2020  0425 Awakened patient from sleep to update him of all test results.  Strict return precautions given.  Patient verbalizes understanding and agrees with plan of care. [JS]    Clinical Course User Index [JS] Irean Hong, MD     ____________________________________________   FINAL CLINICAL IMPRESSION(S) / ED DIAGNOSES  Final diagnoses:  Nonspecific chest pain  Chest wall pain     ED Discharge Orders          Ordered    naproxen (NAPROSYN) 500 MG tablet  2 times daily with meals        10/23/20 0427             Note:  This document was prepared using Dragon voice recognition software and may include unintentional dictation errors.    Irean Hong, MD 10/23/20 [phone]

## 2020-10-23 NOTE — Discharge Instructions (Signed)
You may take Naprosyn twice daily as needed.  Apply moist heat to affected area several times daily.  Return to the ER for worsening symptoms, persistent vomiting, difficulty breathing or other concerns.

## 2020-11-10 DIAGNOSIS — E119 Type 2 diabetes mellitus without complications: Secondary | ICD-10-CM

## 2020-11-10 DIAGNOSIS — N2889 Other specified disorders of kidney and ureter: Secondary | ICD-10-CM

## 2020-11-10 HISTORY — DX: Other specified disorders of kidney and ureter: N28.89

## 2020-11-10 HISTORY — DX: Type 2 diabetes mellitus without complications: E11.9

## 2020-11-16 ENCOUNTER — Emergency Department: Payer: Self-pay

## 2020-11-16 ENCOUNTER — Emergency Department
Admission: EM | Admit: 2020-11-16 | Discharge: 2020-11-17 | Disposition: A | Discharge status: Home / Self Care | Payer: Self-pay | Attending: Emergency Medicine | Admitting: Emergency Medicine

## 2020-11-16 DIAGNOSIS — Z7984 Long term (current) use of oral hypoglycemic drugs: Secondary | ICD-10-CM | POA: Insufficient documentation

## 2020-11-16 DIAGNOSIS — R1013 Epigastric pain: Secondary | ICD-10-CM | POA: Insufficient documentation

## 2020-11-16 DIAGNOSIS — R1030 Lower abdominal pain, unspecified: Secondary | ICD-10-CM | POA: Insufficient documentation

## 2020-11-16 DIAGNOSIS — I10 Essential (primary) hypertension: Secondary | ICD-10-CM | POA: Insufficient documentation

## 2020-11-16 DIAGNOSIS — R109 Unspecified abdominal pain: Secondary | ICD-10-CM

## 2020-11-16 DIAGNOSIS — Z7951 Long term (current) use of inhaled steroids: Secondary | ICD-10-CM | POA: Insufficient documentation

## 2020-11-16 DIAGNOSIS — F17210 Nicotine dependence, cigarettes, uncomplicated: Secondary | ICD-10-CM | POA: Insufficient documentation

## 2020-11-16 DIAGNOSIS — Z79899 Other long term (current) drug therapy: Secondary | ICD-10-CM | POA: Insufficient documentation

## 2020-11-16 DIAGNOSIS — J45909 Unspecified asthma, uncomplicated: Secondary | ICD-10-CM | POA: Insufficient documentation

## 2020-11-16 DIAGNOSIS — J449 Chronic obstructive pulmonary disease, unspecified: Secondary | ICD-10-CM | POA: Insufficient documentation

## 2020-11-16 LAB — URINALYSIS, COMPLETE (UACMP) WITH MICROSCOPIC
Bacteria, UA: NONE SEEN
Bilirubin Urine: NEGATIVE
Glucose, UA: NEGATIVE mg/dL
Hgb urine dipstick: NEGATIVE
Ketones, ur: NEGATIVE mg/dL
Leukocytes,Ua: NEGATIVE
Nitrite: NEGATIVE
Protein, ur: NEGATIVE mg/dL
Specific Gravity, Urine: >1.046 — ABNORMAL HIGH (ref 1.005–1.030)
pH: 5 (ref 5.0–8.0)

## 2020-11-16 LAB — CBC
HCT: 49.7 % (ref 39.0–52.0)
Hemoglobin: 15.9 g/dL (ref 13.0–17.0)
MCH: 29.4 pg (ref 26.0–34.0)
MCHC: 32 g/dL (ref 30.0–36.0)
MCV: 91.9 fL (ref 80.0–100.0)
Platelets: 186 10*3/uL (ref 150–400)
RBC: 5.41 MIL/uL (ref 4.22–5.81)
RDW: 14 % (ref 11.5–15.5)
WBC: 9.6 10*3/uL (ref 4.0–10.5)
nRBC: 0 % (ref 0.0–0.2)

## 2020-11-16 LAB — COMPREHENSIVE METABOLIC PANEL
ALT: 21 U/L (ref 0–44)
AST: 19 U/L (ref 15–41)
Albumin: 3.6 g/dL (ref 3.5–5.0)
Alkaline Phosphatase: 68 U/L (ref 38–126)
Anion gap: 7 (ref 5–15)
BUN: 15 mg/dL (ref 6–20)
CO2: 32 mmol/L (ref 22–32)
Calcium: 8.6 mg/dL — ABNORMAL LOW (ref 8.9–10.3)
Chloride: 101 mmol/L (ref 98–111)
Creatinine, Ser: 1.05 mg/dL (ref 0.61–1.24)
GFR, Estimated: >60 mL/min (ref >60)
Glucose, Bld: 115 mg/dL — ABNORMAL HIGH (ref 70–99)
Potassium: 3.9 mmol/L (ref 3.5–5.1)
Sodium: 140 mmol/L (ref 135–145)
Total Bilirubin: 0.8 mg/dL (ref 0.3–1.2)
Total Protein: 6.8 g/dL (ref 6.5–8.1)

## 2020-11-16 LAB — TROPONIN I (HIGH SENSITIVITY): Troponin I (High Sensitivity): 10 ng/L (ref <18)

## 2020-11-16 LAB — LIPASE, BLOOD: Lipase: 37 U/L (ref 11–51)

## 2020-11-16 MED ORDER — PREDNISONE 10 MG PO TABS
ORAL_TABLET | ORAL | 0 refills | Status: DC
  Filled 2020-11-16: qty 25, 5d supply, fill #0

## 2020-11-16 MED ORDER — IOHEXOL 300 MG/ML  SOLN
125.0000 mL | Freq: Once | INTRAMUSCULAR | Status: AC | PRN
  Administered 2020-11-16: 125 mL via INTRAVENOUS
  Filled 2020-11-16: qty 125

## 2020-11-16 MED ORDER — PREDNISONE 20 MG PO TABS
60.0000 mg | ORAL_TABLET | Freq: Once | ORAL | Status: AC
  Administered 2020-11-16: 60 mg via ORAL
  Filled 2020-11-16: qty 3

## 2020-11-16 MED ORDER — IPRATROPIUM-ALBUTEROL 0.5-2.5 (3) MG/3ML IN SOLN
3.0000 mL | Freq: Once | RESPIRATORY_TRACT | Status: AC
  Administered 2020-11-16: 3 mL via RESPIRATORY_TRACT
  Filled 2020-11-16: qty 3

## 2020-11-16 MED ORDER — ALBUTEROL SULFATE (2.5 MG/3ML) 0.083% IN NEBU
2.5000 mg | INHALATION_SOLUTION | Freq: Once | RESPIRATORY_TRACT | Status: AC
  Administered 2020-11-16: 2.5 mg via RESPIRATORY_TRACT
  Filled 2020-11-16: qty 3

## 2020-11-16 NOTE — ED Notes (Signed)
First nurse-pt brought in by  ems.  Pt is homeless.  Pt has abd pain for 3-4 days.  Bp 107/45, p-76,t-97.8 fsbs-161 per ems.  Iv in place.

## 2020-11-16 NOTE — ED Provider Notes (Signed)
[facility]  ____________________________________________   Event Date/Time   First MD Initiated Contact with Patient 11/16/20 2004     (approximate)  I have reviewed the triage vital signs and the nursing notes.   HISTORY  Chief Complaint No chief complaint on file.    HPI Kellon Chalk is a 50 y.o. male with past medical history of hypertension, GERD, depression, anxiety, COPD, schizophrenia, homelessness who presents with abdominal pain.  Symptoms started about 2 to 3 days ago.  He endorses a sharp stabbing pain in the lower abdomen and epigastric region.  It is intermittent.  Denies associated nausea vomiting.  Does have diarrhea.  He denies urinary symptoms.  No fevers or chills.  Patient notes that he always wheezes secondary to his COPD and this is not new for him.  Denies increasing shortness of breath or chest pain.  Denies cough.  Patient is currently homeless, tells me that he sleeps however he can.         Past Medical History:  Diagnosis Date   Depression with anxiety    GERD (gastroesophageal reflux disease)    HTN (hypertension)    Rheumatoid arthritis (HCC)    Tobacco abuse     Patient Active Problem List   Diagnosis Date Noted   Renal mass 10/15/2020   Depression with anxiety 10/15/2020   GERD (gastroesophageal reflux disease) 10/15/2020   Tobacco abuse 10/15/2020   HTN (hypertension) 10/15/2020   Renal mass, right 10/15/2020   COPD (chronic obstructive pulmonary disease) (HCC) 10/15/2020   Diarrhea 10/15/2020   Morbid obesity with BMI of 50.0-59.9, adult (HCC) 10/15/2020   Homeless 10/15/2020   Rheumatoid arthritis (HCC) 10/15/2020   Abdominal pain 10/15/2020   MDD (major depressive disorder), recurrent episode (HCC) 09/28/2020   Anxiety 09/28/2020   Schizo affective schizophrenia (HCC) 09/28/2020   Prediabetes 11/29/2018   Cigarette smoker 07/11/2018   Asthma without status asthmaticus 03/27/2018   Psoriasis,  unspecified 06/28/2012    Past Surgical History:  Procedure Laterality Date   left index finger surgery Left     Prior to Admission medications   Medication Sig Start Date End Date Taking? Authorizing Provider  predniSONE (DELTASONE) 50 MG tablet Take 1 pill daily for 5 days 11/16/20  Yes Georga Hacking, MD  Adalimumab 40 MG/0.4ML PSKT Inject 40 mg into the skin once every 14 (fourteen) days. 10/21/20   Wouk, Wilfred Curtis, MD  amLODipine (NORVASC) 5 MG tablet Take 1 tablet (5 mg total) by mouth once daily. 10/21/20   Wouk, Wilfred Curtis, MD  DULoxetine (CYMBALTA) 60 MG capsule Take 1 capsule (60 mg total) by mouth once daily. 10/21/20   Wouk, Wilfred Curtis, MD  famotidine (PEPCID) 20 MG tablet Take 1 tablet (20 mg total) by mouth 2 (two) times daily. 10/21/20   Wouk, Wilfred Curtis, MD  Fluticasone-Umeclidin-Vilant (TRELEGY ELLIPTA) 100-62.5-25 MCG/INH AEPB Inhale 1 puff into the lungs once daily. 10/21/20   Wouk, Wilfred Curtis, MD  hydrOXYzine (ATARAX/VISTARIL) 25 MG tablet Take 25 mg by mouth 3 (three) times daily as needed for anxiety or sleep.    [provider]  metFORMIN (GLUCOPHAGE) 500 MG tablet Take 1 tablet (500 mg total) by mouth 2 (two) times daily with a meal. 10/21/20   Wouk, Wilfred Curtis, MD  naproxen (NAPROSYN) 500 MG tablet Take 1 tablet (500 mg total) by mouth 2 (two) times daily with a meal. 10/23/20   Irean Hong, MD  risperidone (RISPERDAL) 4 MG tablet Take 1  tablet (4 mg total) by mouth once daily. 10/21/20   Wouk, Wilfred Curtis, MD    Allergies Patient has no known allergies.  Family History  Problem Relation Age of Onset   Schizophrenia Mother    Diabetes Mellitus II Father     Social History Social History   Tobacco Use   Smoking status: Every Day    Packs/day: 1.00    Types: Cigarettes   Smokeless tobacco: Never  Vaping Use   Vaping Use: Never used  Substance Use Topics   Alcohol use: Not Currently   Drug use: Never    Review of Systems    Review of Systems  Respiratory:  Positive for wheezing. Negative for cough, chest tightness and shortness of breath.   Cardiovascular:  Negative for chest pain.  Gastrointestinal:  Positive for abdominal pain. Negative for diarrhea, nausea and vomiting.  Genitourinary:  Negative for dysuria.  All other systems reviewed and are negative.  Physical Exam Updated Vital Signs BP (!) 148/90 (BP Location: Right Arm)   Pulse 100   Temp 98 F (36.7 C) (Oral)   Resp 19   SpO2 96%   Physical Exam Vitals and nursing note reviewed.  Constitutional:      General: He is not in acute distress.    Appearance: Normal appearance. He is obese.     Comments: Patient is disheveled, unkept  HENT:     Head: Normocephalic and atraumatic.  Eyes:     General: No scleral icterus.    Conjunctiva/sclera: Conjunctivae normal.  Cardiovascular:     Rate and Rhythm: Normal rate and regular rhythm.  Pulmonary:     Effort: Pulmonary effort is normal. No respiratory distress.     Breath sounds: Wheezing present.     Comments: Patient has audible wheezing from the door, there is biphasic wheezing with good air movement, he speaks in full sentences without increased work of breathing Abdominal:     Palpations: Abdomen is soft.     Comments: Abdomen is mildly tender to palpation throughout  Musculoskeletal:        General: No deformity or signs of injury.     Cervical back: Normal range of motion.  Skin:    Coloration: Skin is not jaundiced or pale.  Neurological:     General: No focal deficit present.     Mental Status: He is alert and oriented to person, place, and time. Mental status is at baseline.  Psychiatric:        Mood and Affect: Mood normal.        Behavior: Behavior normal.     LABS (all labs ordered are listed, but only abnormal results are displayed)  Labs Reviewed  COMPREHENSIVE METABOLIC PANEL - Abnormal; Notable for the following components:      Result Value   Glucose, Bld 115 (*)     Calcium 8.6 (*)    All other components within normal limits  CBC  LIPASE, BLOOD  URINALYSIS, COMPLETE (UACMP) WITH MICROSCOPIC  TROPONIN I (HIGH SENSITIVITY)   ____________________________________________  EKG  N/a ____________________________________________  RADIOLOGY Ky Barban, personally viewed and evaluated these images (plain radiographs) as part of my medical decision making, as well as reviewing the written report by the radiologist.  ED MD interpretation:  I reviewed the cxr which shows atelectasis   I reviewed the CT of the A/P which is negative for acute abnormality     ____________________________________________   PROCEDURES  Procedure(s) performed (including Critical Care):  Procedures   ____________________________________________   INITIAL IMPRESSION / ASSESSMENT AND PLAN / ED COURSE   Patient is a 50 year old male who presents with 2 to 3 days of abdominal pain.  Sharp in nature and poorly localized.  On exam he has significant wheezing and tells me that this is how his breathing or wheezes.  It is audible from the door.  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  His abdomen is somewhat difficult to assess secondary to his morbid obesity however he is tender throughout so we will obtain a CT abdomen and pelvis with contrast.  We will also treat for COPD exacerbation.  Will obtain chest x-ray and treat with duo nebs and prednisone.  Disposition pending CT and reassessment after nebs.     ____________________________________________   FINAL CLINICAL IMPRESSION(S) / ED DIAGNOSES  Final diagnoses:  Chronic obstructive pulmonary disease, unspecified COPD type (HCC)  Abdominal pain, unspecified abdominal location     ED Discharge Orders          Ordered    predniSONE (DELTASONE) 50 MG tablet        11/16/20 2223             Note:   This document was prepared using Dragon voice recognition software and may include unintentional dictation errors.    Georga Hacking, MD 11/16/20 2223

## 2020-11-16 NOTE — Discharge Instructions (Signed)
Your CT scan did not show any obvious cause of your abdominal pain. Please follow-up with your primary care provider if you continue to have symptoms.

## 2020-11-16 NOTE — ED Provider Notes (Signed)
Emergency Medicine Provider Triage Evaluation Note  Raymond Hood , a 50 y.o. male  was evaluated in triage.  Pt complains of 3 days of generalized abdominal pain.  Describes it as sharp and stabbing.  No fevers nausea vomiting.  No diarrhea.  Denies any constipation, normal bowel movement today..  Review of Systems  Positive: Abdominal pain Negative: Fevers, nausea, vomiting  Physical Exam  There were no vitals taken for this visit. Gen:   Awake, no distress   Resp:  Normal effort  MSK:   Moves extremities without difficulty  Other:    Medical Decision Making  Medically screening exam initiated at 4:49 PM.  Appropriate orders placed.  Cullin Dishman was informed that the remainder of the evaluation will be completed by another provider, this initial triage assessment does not replace that evaluation, and the importance of remaining in the ED until their evaluation is complete.     Evon Slack, PA-C 11/16/20 1650    Dionne Bucy, MD 11/16/20 [phone]

## 2020-11-16 NOTE — ED Triage Notes (Addendum)
Pt presents to ED with c/o of ABD pain for 3 days. Pt also presents with all of his belongings stating "I'm homeless". Pt denies fevers or chills. Pt denies N/V/D. NAD noted.    Pt eating and drinking in triage even after being told to stop due to his CC.

## 2020-11-16 NOTE — ED Provider Notes (Signed)
ED ECG REPORT I, Dionne Bucy, the attending physician, personally viewed and interpreted this ECG.  Date: 11/16/2020 EKG Time: 2252 Rate: 89 Rhythm: normal sinus rhythm QRS Axis: normal Intervals: normal ST/T Wave abnormalities: normal Narrative Interpretation: no evidence of acute ischemia  ----------------------------------------- 11:38 PM on 11/16/2020 -----------------------------------------  I took over care of this patient from Dr. Sidney Ace.  CT is negative for acute abnormalities.  On reassessment, the patient appears comfortable and does not demonstrate audible wheezing or any respiratory distress.  He is stable for discharge.  Return precautions given, and he expresses understanding.    Dionne Bucy, MD 11/16/20 [phone]

## 2020-11-17 ENCOUNTER — Other Ambulatory Visit: Payer: Self-pay

## 2020-11-17 NOTE — ED Notes (Signed)
E signature pad broken. Pt educated on discharge instructions and verbalized understanding.

## 2020-12-10 ENCOUNTER — Other Ambulatory Visit (HOSPITAL_COMMUNITY): Payer: Self-pay

## 2020-12-10 ENCOUNTER — Encounter: Payer: Self-pay | Admitting: Physician Assistant

## 2020-12-10 ENCOUNTER — Other Ambulatory Visit: Payer: Self-pay | Admitting: Critical Care Medicine

## 2020-12-10 ENCOUNTER — Other Ambulatory Visit: Payer: Self-pay

## 2020-12-10 MED ORDER — FLUTICASONE PROPIONATE 50 MCG/ACT NA SUSP
2.0000 | Freq: Every day | NASAL | 6 refills | Status: DC
Start: 2020-12-10 — End: 2021-01-13
  Filled 2020-12-10: qty 16, 30d supply, fill #0

## 2020-12-10 MED ORDER — RISPERIDONE 4 MG PO TABS
4.0000 mg | ORAL_TABLET | Freq: Every day | ORAL | 0 refills | Status: DC
  Filled 2020-12-10: qty 30, 30d supply, fill #0

## 2020-12-10 MED ORDER — FAMOTIDINE 20 MG PO TABS
20.0000 mg | ORAL_TABLET | Freq: Two times a day (BID) | ORAL | 0 refills | Status: DC
  Filled 2020-12-10: qty 30, 15d supply, fill #0

## 2020-12-10 MED ORDER — METFORMIN HCL 500 MG PO TABS
500.0000 mg | ORAL_TABLET | Freq: Two times a day (BID) | ORAL | 0 refills | Status: DC
  Filled 2020-12-10: qty 60, 30d supply, fill #0

## 2020-12-10 MED ORDER — HYDROXYZINE HCL 25 MG PO TABS
25.0000 mg | ORAL_TABLET | Freq: Three times a day (TID) | ORAL | 2 refills | Status: DC | PRN
  Filled 2020-12-10: qty 90, 30d supply, fill #0

## 2020-12-10 MED ORDER — DULOXETINE HCL 60 MG PO CPEP
60.0000 mg | ORAL_CAPSULE | Freq: Every day | ORAL | 0 refills | Status: DC
  Filled 2020-12-10: qty 30, 30d supply, fill #0

## 2020-12-10 MED ORDER — AMLODIPINE BESYLATE 5 MG PO TABS
5.0000 mg | ORAL_TABLET | Freq: Every day | ORAL | 0 refills | Status: DC
  Filled 2020-12-10: qty 30, 30d supply, fill #0

## 2020-12-10 NOTE — Progress Notes (Signed)
Pt needs a letter to use the elevator.   One was written.  [name], PA-C 12/10/2020 5:00 PM

## 2020-12-10 NOTE — Progress Notes (Signed)
Pt seen by Dr Delford Field.  Pt was living in [place], but lost his job and could not pay the rent. He was homeless and on the street. He has been at the shelter for 2 days.  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 10/21/20  amLODipine (NORVASC) 5 MG tablet Take 1 tablet (5 mg total) by mouth once daily. 10/21/20  DULoxetine (CYMBALTA) 60 MG capsule Take 1 capsule (60 mg total) by mouth once daily. 10/21/20  famotidine (PEPCID) 20 MG tablet Take 1 tablet (20 mg total) by mouth 2 (two) times daily. 10/21/20  Fluticasone-Umeclidin-Vilant (TRELEGY ELLIPTA) 100-62.5-25 MCG/INH AEPB >> sub Symbicort Inhale 1 puff into the lungs once daily. 10/21/20  hydrOXYzine (ATARAX/VISTARIL) 25 MG tablet Take 25 mg by mouth 3 (three) times daily as needed for anxiety or sleep.   metFORMIN (GLUCOPHAGE) 500 MG tablet Take 1 tablet (500 mg total) by mouth 2 (two) times daily with a meal. 10/21/20  risperidone (RISPERDAL) 4 MG tablet Take 1 tablet (4 mg total) by mouth once daily. 10/21/20   Major problem right now is back pain. His RA is painful right now.   He is willing to go back on the CPAP, will try to get the machine from [place].   He was given Tylenol, socks. His shoes are really worn out, they are 13 EEE. He could not wear a 13  EE  The only medication he has is Tylenol. He will send in amlodipine 5 mg qd, Pepcid 20 mg bid, symbicort 80 mcg bid, hydroxyzine prn, metformin bid, risperadal qd  He has an ingrown toenail on the L.    His cell phone is being cut off, he has a new one, the  number is possibly [phone]. He will confirm this.   He has problems riding the bus, but has significant DOE, so can only walk a few steps before getting sob.  His coat does not zip in front, it is a 3x. He probably needs a 5X.   [name], PA-C 12/10/2020 4:21 PM

## 2020-12-11 ENCOUNTER — Other Ambulatory Visit: Payer: Self-pay

## 2020-12-11 ENCOUNTER — Other Ambulatory Visit (HOSPITAL_COMMUNITY): Payer: Self-pay

## 2020-12-14 ENCOUNTER — Other Ambulatory Visit: Payer: Self-pay | Admitting: Critical Care Medicine

## 2020-12-14 DIAGNOSIS — E119 Type 2 diabetes mellitus without complications: Secondary | ICD-10-CM | POA: Insufficient documentation

## 2020-12-14 DIAGNOSIS — L600 Ingrowing nail: Secondary | ICD-10-CM | POA: Insufficient documentation

## 2020-12-14 DIAGNOSIS — E1165 Type 2 diabetes mellitus with hyperglycemia: Secondary | ICD-10-CM | POA: Insufficient documentation

## 2020-12-14 NOTE — Progress Notes (Signed)
Pt with type two DM and has ingrown toenail Left Great Toe and needs attn at podiatry   Pt is uninsured. We will use a patient assistance fund to private pay for his care at [facility]  Arna Medici: pls get this pt scheduled and allow [facility] to [facility] record  He is currently homeless  I will be seeing him in near future at [facility]

## 2020-12-16 ENCOUNTER — Encounter: Payer: Self-pay | Admitting: Critical Care Medicine

## 2020-12-16 ENCOUNTER — Encounter: Payer: Self-pay | Admitting: Physician Assistant

## 2020-12-16 NOTE — Progress Notes (Signed)
Pt seen by Dr Delford Field  He was given shoes, but in his size there were only white ones.   We will try to obtain others, but in 13 EEE, this is difficult.  L ingrown toenail is still bothering him and all toenails are overgrown. Probably onychomycosis as well   [facility] was contacted and appt was made.  He will see them 12/13 at 2:00 pm.  He got his medications.

## 2020-12-22 ENCOUNTER — Encounter: Payer: Self-pay | Admitting: Podiatry

## 2020-12-22 ENCOUNTER — Other Ambulatory Visit: Payer: Self-pay

## 2020-12-22 ENCOUNTER — Ambulatory Visit (INDEPENDENT_AMBULATORY_CARE_PROVIDER_SITE_OTHER): Payer: Self-pay | Admitting: Podiatry

## 2020-12-22 DIAGNOSIS — E1142 Type 2 diabetes mellitus with diabetic polyneuropathy: Secondary | ICD-10-CM

## 2020-12-22 DIAGNOSIS — B351 Tinea unguium: Secondary | ICD-10-CM

## 2020-12-22 DIAGNOSIS — M79676 Pain in unspecified toe(s): Secondary | ICD-10-CM

## 2020-12-22 NOTE — Progress Notes (Signed)
°  Subjective:  Patient ID: Raymond Hood, male    DOB: 12-24-1970,  MRN: [id number] HPI Chief Complaint  Patient presents with   Debridement    Requesting nail trim - hallux nails are sore-ingrown   Diabetes    LAst a1c was 7.3    50 y.o. male presents with the above complaint.    ROS: Denies fever chills nausea vomiting muscle aches pains calf pain back pain chest pain shortness of breath.    Past Medical History:  Diagnosis Date   Bipolar 1 disorder, depressed (HCC)    COPD (chronic obstructive pulmonary disease) (HCC)    Depression with anxiety    Diabetes mellitus type II, non insulin dependent (HCC) 11/2020   A1c 7.3   Gastric ulcer    when on NSAIDs   GERD (gastroesophageal reflux disease)    HTN (hypertension)    OSA (obstructive sleep apnea)    not currently on CPAP   Rheumatoid arthritis (HCC)    Right kidney mass, 2.8 cm 11/2020   Tobacco abuse    Past Surgical History:  Procedure Laterality Date   left index finger surgery Left     Current Outpatient Medications:    amLODipine (NORVASC) 5 MG tablet, Take 1 tablet (5 mg total) by mouth once daily., Disp: 30 tablet, Rfl: 0   DULoxetine (CYMBALTA) 60 MG capsule, Take 1 capsule (60 mg total) by mouth once daily., Disp: 30 capsule, Rfl: 0   famotidine (PEPCID) 20 MG tablet, Take 1 tablet (20 mg total) by mouth 2 (two) times daily., Disp: 30 tablet, Rfl: 0   fluticasone (FLONASE) 50 MCG/ACT nasal spray, Place 2 sprays into both nostrils daily., Disp: 16 g, Rfl: 6   hydrOXYzine (ATARAX) 25 MG tablet, Take 1 tablet (25 mg total) by mouth 3 (three) times daily as needed., Disp: 90 tablet, Rfl: 2   metFORMIN (GLUCOPHAGE) 500 MG tablet, Take 1 tablet (500 mg total) by mouth 2 (two) times daily with a meal., Disp: 60 tablet, Rfl: 0   risperidone (RISPERDAL) 4 MG tablet, Take 1 tablet (4 mg total) by mouth once daily., Disp: 30 tablet, Rfl: 0  No Known Allergies Review of Systems Objective:  There were no vitals filed for  this visit.  General: Well developed, nourished, in no acute distress, alert and oriented x3   Dermatological: Skin is warm, dry and supple bilateral. Nails x 10 are well maintained; remaining integument appears unremarkable at this time. There are no open sores, no preulcerative lesions, no rash or signs of infection present.  Toenails are long thick yellow dystrophic-like mycotic sharply incurvated painful on palpation.  Vascular: Dorsalis Pedis artery and Posterior Tibial artery pedal pulses are 2/4 bilateral with immedate capillary fill time. Pedal hair growth present. No varicosities and no lower extremity edema present bilateral.   Neruologic: Grossly intact via light touch bilateral. Vibratory intact via tuning fork bilateral. Protective threshold with Semmes Wienstein monofilament intact to all pedal sites bilateral. Patellar and Achilles deep tendon reflexes 2+ bilateral. No Babinski or clonus noted bilateral.   Musculoskeletal: No gross boney pedal deformities bilateral. No pain, crepitus, or limitation noted with foot and ankle range of motion bilateral. Muscular strength 5/5 in all groups tested bilateral.  Gait: Unassisted, Nonantalgic.    Radiographs:  None taken  Assessment & Plan:   Assessment: Pain in limb secondary to onychomycosis  Plan: Debridement of painful toenails 1 through 5 bilateral     Ioanna Colquhoun T. [place], [place]

## 2020-12-23 ENCOUNTER — Other Ambulatory Visit: Payer: Self-pay | Admitting: Critical Care Medicine

## 2020-12-23 ENCOUNTER — Encounter: Payer: Self-pay | Admitting: Physician Assistant

## 2020-12-23 ENCOUNTER — Other Ambulatory Visit (HOSPITAL_COMMUNITY): Payer: Self-pay

## 2020-12-23 MED ORDER — BUDESONIDE-FORMOTEROL FUMARATE 80-4.5 MCG/ACT IN AERO
2.0000 | INHALATION_SPRAY | Freq: Two times a day (BID) | RESPIRATORY_TRACT | 0 refills | Status: DC
Start: 2020-12-23 — End: 2021-01-06
  Filled 2020-12-23: qty 10.2, 30d supply, fill #0

## 2020-12-23 NOTE — Progress Notes (Signed)
Pt seen by Dr Delford Field  A coat was obtained for him. It is a 5x. It fits well   His old coat does not fit, pockets were emptied.   He saw the foot doctor, both feet were worked on.   The new shoes are a little tight, he is breaking them in. They have more support than his old shoes.   He has an appt w/ Dr Delford Field, on Jan 9 th at 9:30 am.  Transportation will be arranged by Lorin Picket.   He wants to know if he can take the Pepcid bid, not once a day.   He is on Pepcid 20 mg qd, was given otc meds, to make him able to take it BID.  He is taking the metformin bid, CBG was 219. Continue the metformin for now, will need to add something soon.   He is sleeping ok.   BP 104/65, HR 96, 95% O2.   He has had some coffee, OJ, and a bottle of water today. He was asked to drink more water.   He urinates on himself easily  [name], PA-C 12/23/2020 3:43 PM

## 2020-12-30 ENCOUNTER — Other Ambulatory Visit (HOSPITAL_COMMUNITY): Payer: Self-pay

## 2020-12-30 ENCOUNTER — Encounter: Payer: Self-pay | Admitting: Physician Assistant

## 2020-12-30 ENCOUNTER — Other Ambulatory Visit: Payer: Self-pay | Admitting: *Deleted

## 2020-12-30 NOTE — Progress Notes (Addendum)
Pt seen by Dr Lowell Guitar  He does not have his Symbicort, does not remember getting it (it was sent in 12/14  C/o numbness in his arms and hands, legs and feet.   He was complaining of some dry eyes >> given artificial tears.  He is hearing voices, they tell him to kill people, hurt people, "blow shit up". He does not have plans to do this. If the voices get so loud that he cannot hear anything else, he knows he needs to see Psych MD. He says he will tell the staff if he gets that bad.   He feels the rage build up because he sits all day.   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 It is much worse because he sits all day.   He has not been to the free walk-in clinic at [facility] because it is very early in the morning and he cannot stand in line very well.   Sensory exam shows decreased sensation in both hands and in his shoulders. He c/o bilat foot numbness almost to his knees. The numbness is there most of the time. Sometimes it gets a little better, but generally has been worse over the past month. The hand sx started back when he was installing flooring, he had to walk on his knuckles while doing this.   He also has mid-scapular back pain.  Lidocaine patch applied and he was given extras.                   Strength was normal and equal.   BP 116/74, HR 72  Pt advised to seek urgent help if the numbness got worse.   He was asked to tell staff if the voices get to the point that he will act on what they say, they will need to call 911. Says he will do that.   [name], PA-C 12/30/2020 3:57 PM  50 yo with HTN, COPD, GERD, schizophrenia(?), and multiple other medical issues requesting medications and with concern for auditory hallucinations.   Notes hx of voices since being child, says he was hospitalized related to this when he was a child (but not since).  Voices as described above, he denies plan or intention to  follow through with what he's hearing.  His psychiatrist is in [place], doesn't have one here.  [name] has arranged transportation to behavioral health to be seen tomorrow.  Also c/o UE numbness - over past month are so.  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  Intact strength.  Will look into neurology follow up..  Symmetric strength to bilateral upper extremities, decreased sensation to light touch.  [name]

## 2020-12-31 ENCOUNTER — Other Ambulatory Visit (HOSPITAL_COMMUNITY): Payer: Self-pay

## 2020-12-31 NOTE — Progress Notes (Deleted)
°  Progress Note    Raymond Hood   KZL:[id number]  DOB: 12/05/1970  DOA: (Not on file)     0 Date of Service: 12/31/2020   Clinical Course {CHL Clinical Course:26391}  Assessment and Plan @LASTPROBAPNOTESCHL @   Subjective:  ***  Objective There were no vitals filed for this visit.    {Vitals:26382}   Exam Physical Exam ***  Labs / Other Information {Results:26384}   Disposition Plan: *** Patient class is not currently inpatient or observation. Update patient class prior to completing documentation***    Time spent: *** minutes [facility] 12/31/2020, 2:34 AM

## 2021-01-06 ENCOUNTER — Other Ambulatory Visit: Payer: Self-pay | Admitting: Physician Assistant

## 2021-01-06 ENCOUNTER — Other Ambulatory Visit: Payer: Self-pay | Admitting: Critical Care Medicine

## 2021-01-06 ENCOUNTER — Other Ambulatory Visit (HOSPITAL_COMMUNITY): Payer: Self-pay

## 2021-01-06 ENCOUNTER — Encounter: Payer: Self-pay | Admitting: Physician Assistant

## 2021-01-06 MED ORDER — BUDESONIDE-FORMOTEROL FUMARATE 80-4.5 MCG/ACT IN AERO
2.0000 | INHALATION_SPRAY | Freq: Two times a day (BID) | RESPIRATORY_TRACT | 0 refills | Status: DC
  Filled 2021-01-06: qty 10.2, 30d supply, fill #0

## 2021-01-06 NOTE — Progress Notes (Signed)
Pt seen by Dr Delford Field  Pt c/o nocturnal urination, several times/night. This is new.    The voices have been going on since he was a child. 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 He will think about a mental health appt in the new year.   He is compliant w/ Risperdal, has prn hydroxyzine  CBG was 219 at previous visit. It is 136 today.   He has not seen MH provider, is encouraged to go to [facility] to get a [facility] appt   Has not gotten Symbicort, does not have any inhalers. [name] picked it up and gave it to him, not sure what happened after that.   117/76, HR 99, 100% on RA Still wheezing on exam. A repeat inhaler was sent in, lost last inhaler.   [name], PA-C 01/06/2021 4:12 PM

## 2021-01-13 ENCOUNTER — Other Ambulatory Visit: Payer: Self-pay

## 2021-01-13 ENCOUNTER — Inpatient Hospital Stay (HOSPITAL_COMMUNITY)
Admission: EM | Admit: 2021-01-13 | Discharge: 2021-01-19 | DRG: 291 | Disposition: A | Discharge status: Home / Self Care | Payer: Self-pay | Attending: Family Medicine | Admitting: Family Medicine

## 2021-01-13 ENCOUNTER — Other Ambulatory Visit: Payer: Self-pay | Admitting: Critical Care Medicine

## 2021-01-13 ENCOUNTER — Encounter (HOSPITAL_COMMUNITY): Payer: Self-pay | Admitting: Emergency Medicine

## 2021-01-13 ENCOUNTER — Emergency Department (HOSPITAL_COMMUNITY): Payer: Self-pay

## 2021-01-13 ENCOUNTER — Encounter: Payer: Self-pay | Admitting: Physician Assistant

## 2021-01-13 ENCOUNTER — Other Ambulatory Visit (HOSPITAL_COMMUNITY): Payer: Self-pay

## 2021-01-13 DIAGNOSIS — E1165 Type 2 diabetes mellitus with hyperglycemia: Secondary | ICD-10-CM | POA: Insufficient documentation

## 2021-01-13 DIAGNOSIS — I152 Hypertension secondary to endocrine disorders: Secondary | ICD-10-CM | POA: Diagnosis present

## 2021-01-13 DIAGNOSIS — M069 Rheumatoid arthritis, unspecified: Secondary | ICD-10-CM | POA: Diagnosis present

## 2021-01-13 DIAGNOSIS — E8729 Other acidosis: Secondary | ICD-10-CM | POA: Diagnosis present

## 2021-01-13 DIAGNOSIS — Z20822 Contact with and (suspected) exposure to covid-19: Secondary | ICD-10-CM | POA: Diagnosis present

## 2021-01-13 DIAGNOSIS — Z79899 Other long term (current) drug therapy: Secondary | ICD-10-CM

## 2021-01-13 DIAGNOSIS — G4733 Obstructive sleep apnea (adult) (pediatric): Secondary | ICD-10-CM | POA: Diagnosis present

## 2021-01-13 DIAGNOSIS — J9602 Acute respiratory failure with hypercapnia: Secondary | ICD-10-CM | POA: Diagnosis present

## 2021-01-13 DIAGNOSIS — F259 Schizoaffective disorder, unspecified: Secondary | ICD-10-CM | POA: Diagnosis present

## 2021-01-13 DIAGNOSIS — K219 Gastro-esophageal reflux disease without esophagitis: Secondary | ICD-10-CM | POA: Diagnosis present

## 2021-01-13 DIAGNOSIS — Z5901 Sheltered homelessness: Secondary | ICD-10-CM

## 2021-01-13 DIAGNOSIS — E662 Morbid (severe) obesity with alveolar hypoventilation: Secondary | ICD-10-CM

## 2021-01-13 DIAGNOSIS — F17210 Nicotine dependence, cigarettes, uncomplicated: Secondary | ICD-10-CM | POA: Diagnosis present

## 2021-01-13 DIAGNOSIS — E1159 Type 2 diabetes mellitus with other circulatory complications: Secondary | ICD-10-CM | POA: Diagnosis present

## 2021-01-13 DIAGNOSIS — Z6841 Body Mass Index (BMI) 40.0 and over, adult: Secondary | ICD-10-CM

## 2021-01-13 DIAGNOSIS — Z818 Family history of other mental and behavioral disorders: Secondary | ICD-10-CM

## 2021-01-13 DIAGNOSIS — R0902 Hypoxemia: Secondary | ICD-10-CM

## 2021-01-13 DIAGNOSIS — J441 Chronic obstructive pulmonary disease with (acute) exacerbation: Secondary | ICD-10-CM | POA: Diagnosis present

## 2021-01-13 DIAGNOSIS — J449 Chronic obstructive pulmonary disease, unspecified: Secondary | ICD-10-CM | POA: Diagnosis present

## 2021-01-13 DIAGNOSIS — I5033 Acute on chronic diastolic (congestive) heart failure: Principal | ICD-10-CM | POA: Diagnosis present

## 2021-01-13 DIAGNOSIS — Z5900 Homelessness unspecified: Secondary | ICD-10-CM

## 2021-01-13 DIAGNOSIS — I5032 Chronic diastolic (congestive) heart failure: Secondary | ICD-10-CM

## 2021-01-13 DIAGNOSIS — Z9151 Personal history of suicidal behavior: Secondary | ICD-10-CM

## 2021-01-13 DIAGNOSIS — E119 Type 2 diabetes mellitus without complications: Secondary | ICD-10-CM

## 2021-01-13 DIAGNOSIS — I10 Essential (primary) hypertension: Secondary | ICD-10-CM

## 2021-01-13 DIAGNOSIS — Z7984 Long term (current) use of oral hypoglycemic drugs: Secondary | ICD-10-CM

## 2021-01-13 DIAGNOSIS — Z91199 Patient's noncompliance with other medical treatment and regimen due to unspecified reason: Secondary | ICD-10-CM

## 2021-01-13 DIAGNOSIS — J9601 Acute respiratory failure with hypoxia: Secondary | ICD-10-CM | POA: Diagnosis present

## 2021-01-13 DIAGNOSIS — E1169 Type 2 diabetes mellitus with other specified complication: Secondary | ICD-10-CM | POA: Diagnosis present

## 2021-01-13 DIAGNOSIS — Z833 Family history of diabetes mellitus: Secondary | ICD-10-CM

## 2021-01-13 DIAGNOSIS — F250 Schizoaffective disorder, bipolar type: Secondary | ICD-10-CM | POA: Diagnosis present

## 2021-01-13 DIAGNOSIS — Z8711 Personal history of peptic ulcer disease: Secondary | ICD-10-CM

## 2021-01-13 DIAGNOSIS — Z7951 Long term (current) use of inhaled steroids: Secondary | ICD-10-CM

## 2021-01-13 DIAGNOSIS — J4489 Other specified chronic obstructive pulmonary disease: Secondary | ICD-10-CM | POA: Diagnosis present

## 2021-01-13 LAB — CBC
HCT: 49.4 % (ref 39.0–52.0)
Hemoglobin: 15.1 g/dL (ref 13.0–17.0)
MCH: 29.2 pg (ref 26.0–34.0)
MCHC: 30.6 g/dL (ref 30.0–36.0)
MCV: 95.6 fL (ref 80.0–100.0)
Platelets: 198 10*3/uL (ref 150–400)
RBC: 5.17 MIL/uL (ref 4.22–5.81)
RDW: 14.6 % (ref 11.5–15.5)
WBC: 13.6 10*3/uL — ABNORMAL HIGH (ref 4.0–10.5)
nRBC: 0.4 % — ABNORMAL HIGH (ref 0.0–0.2)

## 2021-01-13 LAB — BLOOD GAS, VENOUS
Acid-Base Excess: 4.6 mmol/L — ABNORMAL HIGH (ref 0.0–2.0)
Bicarbonate: 35.3 mmol/L — ABNORMAL HIGH (ref 20.0–28.0)
O2 Saturation: 62.2 %
Patient temperature: 98.6
pCO2, Ven: 88 mmHg (ref 44.0–60.0)
pH, Ven: 7.227 — ABNORMAL LOW (ref 7.250–7.430)
pO2, Ven: 35.4 mmHg (ref 32.0–45.0)

## 2021-01-13 LAB — BASIC METABOLIC PANEL
Anion gap: 6 (ref 5–15)
BUN: 19 mg/dL (ref 6–20)
CO2: 36 mmol/L — ABNORMAL HIGH (ref 22–32)
Calcium: 8 mg/dL — ABNORMAL LOW (ref 8.9–10.3)
Chloride: 100 mmol/L (ref 98–111)
Creatinine, Ser: 1.07 mg/dL (ref 0.61–1.24)
GFR, Estimated: >60 mL/min (ref >60)
Glucose, Bld: 131 mg/dL — ABNORMAL HIGH (ref 70–99)
Potassium: 3.9 mmol/L (ref 3.5–5.1)
Sodium: 142 mmol/L (ref 135–145)

## 2021-01-13 LAB — RESP PANEL BY RT-PCR (FLU A&B, COVID) ARPGX2
Influenza A by PCR: NEGATIVE
Influenza B by PCR: NEGATIVE
SARS Coronavirus 2 by RT PCR: NEGATIVE

## 2021-01-13 LAB — GLUCOSE, CAPILLARY: Glucose-Capillary: 194 mg/dL — ABNORMAL HIGH (ref 70–99)

## 2021-01-13 LAB — MRSA NEXT GEN BY PCR, NASAL: MRSA by PCR Next Gen: NOT DETECTED

## 2021-01-13 LAB — BRAIN NATRIURETIC PEPTIDE: B Natriuretic Peptide: 184.6 pg/mL — ABNORMAL HIGH (ref 0.0–100.0)

## 2021-01-13 MED ORDER — INSULIN ASPART 100 UNIT/ML IJ SOLN
0.0000 [IU] | Freq: Every day | INTRAMUSCULAR | Status: DC
  Administered 2021-01-14 – 2021-01-16 (×3): 2 [IU] via SUBCUTANEOUS

## 2021-01-13 MED ORDER — CHLORHEXIDINE GLUCONATE CLOTH 2 % EX PADS
6.0000 | MEDICATED_PAD | Freq: Every day | CUTANEOUS | Status: DC
  Administered 2021-01-13 – 2021-01-19 (×7): 6 via TOPICAL

## 2021-01-13 MED ORDER — HYDROXYZINE HCL 25 MG PO TABS
25.0000 mg | ORAL_TABLET | Freq: Three times a day (TID) | ORAL | Status: DC | PRN
  Administered 2021-01-14 – 2021-01-18 (×4): 25 mg via ORAL
  Filled 2021-01-13 (×4): qty 1

## 2021-01-13 MED ORDER — METHYLPREDNISOLONE SODIUM SUCC 40 MG IJ SOLR
40.0000 mg | Freq: Two times a day (BID) | INTRAMUSCULAR | Status: DC
  Administered 2021-01-14 – 2021-01-17 (×7): 40 mg via INTRAVENOUS
  Filled 2021-01-13 (×7): qty 1

## 2021-01-13 MED ORDER — DULOXETINE HCL 60 MG PO CPEP
60.0000 mg | ORAL_CAPSULE | Freq: Every day | ORAL | Status: DC
  Administered 2021-01-14 – 2021-01-19 (×6): 60 mg via ORAL
  Filled 2021-01-13: qty 2
  Filled 2021-01-13 (×5): qty 1

## 2021-01-13 MED ORDER — HYDROXYZINE HCL 25 MG PO TABS
25.0000 mg | ORAL_TABLET | Freq: Three times a day (TID) | ORAL | 2 refills | Status: DC | PRN
  Filled 2021-01-13 – 2021-01-21 (×2): qty 90, 30d supply, fill #0
  Filled 2021-03-18: qty 90, 30d supply, fill #1

## 2021-01-13 MED ORDER — SODIUM CHLORIDE 0.9 % IV SOLN
500.0000 mg | INTRAVENOUS | Status: AC
  Administered 2021-01-13: 500 mg via INTRAVENOUS
  Filled 2021-01-13: qty 5

## 2021-01-13 MED ORDER — RISPERIDONE 1 MG PO TABS
4.0000 mg | ORAL_TABLET | Freq: Every day | ORAL | Status: DC
  Administered 2021-01-14 – 2021-01-19 (×6): 4 mg via ORAL
  Filled 2021-01-13: qty 2
  Filled 2021-01-13 (×5): qty 4

## 2021-01-13 MED ORDER — SODIUM CHLORIDE 0.9% FLUSH
3.0000 mL | Freq: Two times a day (BID) | INTRAVENOUS | Status: DC
  Administered 2021-01-13 – 2021-01-19 (×12): 3 mL via INTRAVENOUS

## 2021-01-13 MED ORDER — GUAIFENESIN ER 600 MG PO TB12
600.0000 mg | ORAL_TABLET | Freq: Two times a day (BID) | ORAL | Status: DC
  Administered 2021-01-13 – 2021-01-19 (×12): 600 mg via ORAL
  Filled 2021-01-13 (×12): qty 1

## 2021-01-13 MED ORDER — IPRATROPIUM-ALBUTEROL 0.5-2.5 (3) MG/3ML IN SOLN
3.0000 mL | Freq: Once | RESPIRATORY_TRACT | Status: AC
  Administered 2021-01-13: 3 mL via RESPIRATORY_TRACT
  Filled 2021-01-13: qty 3

## 2021-01-13 MED ORDER — MOMETASONE FURO-FORMOTEROL FUM 100-5 MCG/ACT IN AERO
2.0000 | INHALATION_SPRAY | Freq: Two times a day (BID) | RESPIRATORY_TRACT | Status: DC
  Administered 2021-01-14 – 2021-01-19 (×11): 2 via RESPIRATORY_TRACT
  Filled 2021-01-13: qty 8.8

## 2021-01-13 MED ORDER — FAMOTIDINE 20 MG PO TABS
20.0000 mg | ORAL_TABLET | Freq: Two times a day (BID) | ORAL | Status: DC
  Administered 2021-01-14 – 2021-01-19 (×11): 20 mg via ORAL
  Filled 2021-01-13 (×11): qty 1

## 2021-01-13 MED ORDER — SENNOSIDES-DOCUSATE SODIUM 8.6-50 MG PO TABS: 1.0000 | ORAL_TABLET | Freq: Every evening | ORAL | Status: DC | PRN

## 2021-01-13 MED ORDER — AMLODIPINE BESYLATE 5 MG PO TABS
5.0000 mg | ORAL_TABLET | Freq: Every day | ORAL | Status: DC
  Administered 2021-01-14 – 2021-01-19 (×6): 5 mg via ORAL
  Filled 2021-01-13 (×6): qty 1

## 2021-01-13 MED ORDER — RISPERIDONE 4 MG PO TABS
4.0000 mg | ORAL_TABLET | Freq: Every day | ORAL | 0 refills | Status: DC
  Filled 2021-01-13 – 2021-01-21 (×2): qty 30, 30d supply, fill #0

## 2021-01-13 MED ORDER — AZITHROMYCIN 250 MG PO TABS
500.0000 mg | ORAL_TABLET | Freq: Every day | ORAL | Status: AC
  Administered 2021-01-14 – 2021-01-17 (×4): 500 mg via ORAL
  Filled 2021-01-13 (×4): qty 2

## 2021-01-13 MED ORDER — IPRATROPIUM-ALBUTEROL 0.5-2.5 (3) MG/3ML IN SOLN
3.0000 mL | Freq: Four times a day (QID) | RESPIRATORY_TRACT | Status: DC
  Administered 2021-01-14 – 2021-01-15 (×8): 3 mL via RESPIRATORY_TRACT
  Filled 2021-01-13 (×8): qty 3

## 2021-01-13 MED ORDER — ONDANSETRON HCL 4 MG PO TABS: 4.0000 mg | ORAL_TABLET | Freq: Four times a day (QID) | ORAL | Status: DC | PRN

## 2021-01-13 MED ORDER — ALBUTEROL SULFATE (2.5 MG/3ML) 0.083% IN NEBU
2.5000 mg | INHALATION_SOLUTION | RESPIRATORY_TRACT | Status: DC | PRN
  Administered 2021-01-16: 2.5 mg via RESPIRATORY_TRACT
  Filled 2021-01-13: qty 3

## 2021-01-13 MED ORDER — NICOTINE 21 MG/24HR TD PT24
21.0000 mg | MEDICATED_PATCH | Freq: Every day | TRANSDERMAL | Status: DC
  Administered 2021-01-13 – 2021-01-19 (×7): 21 mg via TRANSDERMAL
  Filled 2021-01-13 (×7): qty 1

## 2021-01-13 MED ORDER — METHYLPREDNISOLONE SODIUM SUCC 125 MG IJ SOLR
125.0000 mg | Freq: Once | INTRAMUSCULAR | Status: AC
  Administered 2021-01-13: 125 mg via INTRAVENOUS
  Filled 2021-01-13: qty 2

## 2021-01-13 MED ORDER — ONDANSETRON HCL 4 MG/2ML IJ SOLN: 4.0000 mg | Freq: Four times a day (QID) | INTRAMUSCULAR | Status: DC | PRN

## 2021-01-13 MED ORDER — ENOXAPARIN SODIUM 80 MG/0.8ML IJ SOSY
80.0000 mg | PREFILLED_SYRINGE | INTRAMUSCULAR | Status: DC
  Administered 2021-01-14 – 2021-01-19 (×6): 80 mg via SUBCUTANEOUS
  Filled 2021-01-13 (×6): qty 0.8

## 2021-01-13 MED ORDER — ORAL CARE MOUTH RINSE
15.0000 mL | Freq: Two times a day (BID) | OROMUCOSAL | Status: DC
  Administered 2021-01-14 – 2021-01-19 (×6): 15 mL via OROMUCOSAL

## 2021-01-13 MED ORDER — DULOXETINE HCL 60 MG PO CPEP
60.0000 mg | ORAL_CAPSULE | Freq: Every day | ORAL | 0 refills | Status: DC
  Filled 2021-01-13 – 2021-01-21 (×2): qty 30, 30d supply, fill #0

## 2021-01-13 MED ORDER — FLUTICASONE PROPIONATE 50 MCG/ACT NA SUSP
2.0000 | Freq: Every day | NASAL | 6 refills | Status: DC
  Filled 2021-01-13 – 2021-01-21 (×2): qty 16, 30d supply, fill #0

## 2021-01-13 MED ORDER — METFORMIN HCL 500 MG PO TABS
500.0000 mg | ORAL_TABLET | Freq: Two times a day (BID) | ORAL | 0 refills | Status: DC
Start: 2021-01-13 — End: 2021-01-25
  Filled 2021-01-13 – 2021-01-21 (×2): qty 60, 30d supply, fill #0

## 2021-01-13 MED ORDER — AMLODIPINE BESYLATE 5 MG PO TABS
5.0000 mg | ORAL_TABLET | Freq: Every day | ORAL | 0 refills | Status: DC
  Filled 2021-01-13 – 2021-01-21 (×2): qty 30, 30d supply, fill #0

## 2021-01-13 MED ORDER — INSULIN ASPART 100 UNIT/ML IJ SOLN
0.0000 [IU] | Freq: Three times a day (TID) | INTRAMUSCULAR | Status: DC
  Administered 2021-01-14: 4 [IU] via SUBCUTANEOUS
  Administered 2021-01-14 (×2): 7 [IU] via SUBCUTANEOUS
  Administered 2021-01-15: 11 [IU] via SUBCUTANEOUS
  Administered 2021-01-15: 7 [IU] via SUBCUTANEOUS
  Administered 2021-01-15 – 2021-01-16 (×2): 4 [IU] via SUBCUTANEOUS
  Administered 2021-01-16: 3 [IU] via SUBCUTANEOUS
  Administered 2021-01-16 – 2021-01-17 (×3): 7 [IU] via SUBCUTANEOUS
  Administered 2021-01-17 – 2021-01-18 (×2): 4 [IU] via SUBCUTANEOUS
  Administered 2021-01-18: 3 [IU] via SUBCUTANEOUS
  Administered 2021-01-19 (×2): 4 [IU] via SUBCUTANEOUS

## 2021-01-13 MED ORDER — CHLORHEXIDINE GLUCONATE 0.12 % MT SOLN
15.0000 mL | Freq: Two times a day (BID) | OROMUCOSAL | Status: DC
  Administered 2021-01-13 – 2021-01-19 (×11): 15 mL via OROMUCOSAL
  Filled 2021-01-13 (×11): qty 15

## 2021-01-13 MED ORDER — ACETAMINOPHEN 650 MG RE SUPP: 650.0000 mg | Freq: Four times a day (QID) | RECTAL | Status: DC | PRN

## 2021-01-13 MED ORDER — FAMOTIDINE 20 MG PO TABS
20.0000 mg | ORAL_TABLET | Freq: Two times a day (BID) | ORAL | 0 refills | Status: DC
  Filled 2021-01-13 – 2021-01-21 (×2): qty 30, 15d supply, fill #0

## 2021-01-13 MED ORDER — ACETAMINOPHEN 325 MG PO TABS
650.0000 mg | ORAL_TABLET | Freq: Four times a day (QID) | ORAL | Status: DC | PRN
  Administered 2021-01-17: 650 mg via ORAL
  Filled 2021-01-13: qty 2

## 2021-01-13 MED ORDER — IPRATROPIUM-ALBUTEROL 0.5-2.5 (3) MG/3ML IN SOLN: 3.0000 mL | Freq: Four times a day (QID) | RESPIRATORY_TRACT | Status: DC

## 2021-01-13 NOTE — ED Provider Notes (Signed)
[facility]-EMERGENCY DEPT Provider Note   CSN: [id number] Arrival date & time: 01/13/21  1614     History  Chief Complaint  Patient presents with   Shortness of Breath    Raymond Hood is a 51 y.o. male.   Shortness of Breath Associated symptoms: no chest pain and no fever    Patient has a history of COPD, hypertension and obstructive sleep apnea who presents with shortness of breath.  Patient states in the last 2 days he has been feeling short of breath.  He has been coughing.  He has been bringing up some sputum.  He has had some eye drainage and nasal congestion.  Patient was going for a medical visit when they noted his oxygen saturation was 60%.  Patient denies any trouble with chest pain.  He denies any fevers.  He has not noticed any leg swelling.  EMS provided the patient a DuoNeb and gave him supplemental oxygen.  His oxygen saturation has improved in response  Home Medications Prior to Admission medications   Medication Sig Start Date End Date Taking? Authorizing Provider  amLODipine (NORVASC) 5 MG tablet Take 1 tablet (5 mg total) by mouth once daily. 01/13/21   Storm Frisk, MD  budesonide-formoterol (SYMBICORT) 80-4.5 MCG/ACT inhaler Inhale 2 puffs into the lungs 2 (two) times daily. 01/06/21   Storm Frisk, MD  DULoxetine (CYMBALTA) 60 MG capsule Take 1 capsule (60 mg total) by mouth once daily. 01/13/21   Storm Frisk, MD  famotidine (PEPCID) 20 MG tablet Take 1 tablet (20 mg total) by mouth 2 (two) times daily. 01/13/21   Storm Frisk, MD  fluticasone (FLONASE) 50 MCG/ACT nasal spray Place 2 sprays into both nostrils daily. 01/13/21   Storm Frisk, MD  hydrOXYzine (ATARAX) 25 MG tablet Take 1 tablet (25 mg total) by mouth 3 (three) times daily as needed. 01/13/21   Storm Frisk, MD  metFORMIN (GLUCOPHAGE) 500 MG tablet Take 1 tablet (500 mg total) by mouth 2 (two) times daily with a meal. 01/13/21   Storm Frisk, MD   risperidone (RISPERDAL) 4 MG tablet Take 1 tablet (4 mg total) by mouth once daily. 01/13/21   Storm Frisk, MD      Allergies    Patient has no known allergies.    Review of Systems   Review of Systems  Constitutional:  Negative for fever.  Respiratory:  Positive for shortness of breath.   Cardiovascular:  Negative for chest pain.   Physical Exam Updated Vital Signs BP (!) 171/108 Comment: Simultaneous filing. User may not have seen previous data.   Pulse (!) 105 Comment: Simultaneous filing. User may not have seen previous data.   Temp 98.4 F (36.9 C) (Oral)    Resp (!) 22    SpO2 90% Comment: Simultaneous filing. User may not have seen previous data. Physical Exam Vitals and nursing note reviewed.  Constitutional:      General: He is not in acute distress.    Appearance: He is well-developed.     Comments: Increased BMI  HENT:     Head: Normocephalic and atraumatic.     Right Ear: External ear normal.     Left Ear: External ear normal.  Eyes:     General: No scleral icterus.       Right eye: No discharge.        Left eye: No discharge.     Conjunctiva/sclera: Conjunctivae normal.  Neck:  Trachea: No tracheal deviation.  Cardiovascular:     Rate and Rhythm: Normal rate and regular rhythm.  Pulmonary:     Effort: Pulmonary effort is normal. No respiratory distress.     Breath sounds: No stridor. Wheezing present. No rales.  Abdominal:     General: Bowel sounds are normal. There is no distension.     Palpations: Abdomen is soft.     Tenderness: There is no abdominal tenderness. There is no guarding or rebound.     Comments: Protuberant  Musculoskeletal:        General: No tenderness or deformity.     Cervical back: Neck supple.  Skin:    General: Skin is warm and dry.     Findings: No rash.  Neurological:     General: No focal deficit present.     Mental Status: He is alert.     Cranial Nerves: No cranial nerve deficit (no facial droop, extraocular  movements intact, no slurred speech).     Sensory: No sensory deficit.     Motor: No abnormal muscle tone or seizure activity.     Coordination: Coordination normal.  Psychiatric:        Mood and Affect: Mood normal.    ED Results / Procedures / Treatments   Labs (all labs ordered are listed, but only abnormal results are displayed) Labs Reviewed  BASIC METABOLIC PANEL - Abnormal; Notable for the following components:      Result Value   CO2 36 (*)    Glucose, Bld 131 (*)    Calcium 8.0 (*)    All other components within normal limits  CBC - Abnormal; Notable for the following components:   WBC 13.6 (*)    nRBC 0.4 (*)    All other components within normal limits  BRAIN NATRIURETIC PEPTIDE - Abnormal; Notable for the following components:   B Natriuretic Peptide 184.6 (*)    All other components within normal limits  BLOOD GAS, VENOUS - Abnormal; Notable for the following components:   pH, Ven 7.227 (*)    pCO2, Ven 88.0 (*)    Bicarbonate 35.3 (*)    Acid-Base Excess 4.6 (*)    All other components within normal limits  RESP PANEL BY RT-PCR (FLU A&B, COVID) [id number]    EKG EKG Interpretation  Date/Time:  Wednesday January 13 2021 16:32:41 EST Ventricular Rate:  100 PR Interval:  125 QRS Duration: 91 QT Interval:  362 QTC Calculation: 467 R Axis:   21 Text Interpretation: Sinus tachycardia Probable left atrial enlargement Since last tracing rate faster Confirmed by Linwood Dibbles [phone]) on 01/13/2021 4:44:35 PM  Radiology DG Chest Port 1 View  Result Date: 01/13/2021 CLINICAL DATA:  Dyspnea. EXAM: PORTABLE CHEST 1 VIEW COMPARISON:  11/16/2020 FINDINGS: Cardiac enlargement with mild vascular congestion. Probable mild perihilar edema. No pleural effusion. Mild bibasilar atelectasis. IMPRESSION: Mild vascular congestion and perihilar edema compatible with congestive heart failure. Electronically Signed   By: Marlan Palau M.D.   On: 01/13/2021 17:15    Procedures .1-3 Lead  EKG Interpretation Performed by: Linwood Dibbles, MD Authorized by: Linwood Dibbles, MD     Interpretation: normal     ECG rate:  105   ECG rate assessment: normal     Rhythm: sinus rhythm     Ectopy: none     Conduction: normal      Medications Ordered in ED Medications  ipratropium-albuterol (DUONEB) 0.5-2.5 (3) MG/3ML nebulizer solution 3 mL (3 mLs Nebulization Given 01/13/21  1713)  methylPREDNISolone sodium succinate (SOLU-MEDROL) 125 mg/2 mL injection 125 mg (125 mg Intravenous Given 01/13/21 1710)    ED Course/ Medical Decision Making/ A&P Clinical Course as of 01/13/21 1909  Wed Jan 13, 2021  1733 CBC(!) CBC shows leukocytosis [JK]  1733 DG Chest Port 1 View Chest x-ray images and radiology report reviewed.  Consistent with CHF [JK]  1734 Basic metabolic panel(!) CO2 elevated [JK]  1758 Notified that patient's oxygen saturation decreased while sleeping.  We will add on a VBG [JK]  1835 BNP mildly elevated at 184 [JK]  1847 Venous blood gas does show pH of 7.2 and hypercarbia with PCO2 of 88 consistent with respiratory acidosis.  Will start on BiPAP [JK]  1909 D/w Dr Allena Katz regarding admission [JK]    Clinical Course User Index [JK] Linwood Dibbles, MD                           Medical Decision Making Problems Addressed: COPD with acute exacerbation [facility]): complicated acute illness or injury that poses a threat to life or bodily functions    Details: Complicated by hypoxemia as well as respiratory acidosis.  Patient treated with albuterol Atrovent treatments.  Also given steroids.  Started on BiPAP. Hypertension, unspecified type: chronic illness or injury Hypoxia: acute illness or injury Respiratory acidosis: acute illness or injury  Amount and/or Complexity of Data Reviewed Labs: ordered. Decision-making details documented in ED Course. Radiology: ordered. Decision-making details documented in ED Course. ECG/medicine tests: ordered. Decision-making details documented in ED  Course.   Patient with acute COPD exacerbation with hypoxemia and hypercarbia as noted above.Marland Kitchen Hx of elevated BMI COPD.  States normally supposed to be on BiPAP at home but is homeless and has not been able to use that.  I will consult medical service for admission, further treatment.        Final Clinical Impression(s) / ED Diagnoses Final diagnoses:  Respiratory acidosis  COPD with acute exacerbation (HCC)  Hypoxia  Hypertension, unspecified type        Linwood Dibbles, MD 01/13/21 1910

## 2021-01-13 NOTE — ED Triage Notes (Signed)
BIBA Per EMS: Pt coming from homeless shelter w/ c/o [facility]. Pt was going for routine visit & 68% RA. 72% RA wheezing upon EMS arrival.  Duoneb given en route.  3L [place] 97%  148/76  HR 100

## 2021-01-13 NOTE — H&P (Signed)
History and Physical    Raymond Hood [id number] DOB: [birth year] DOA: 01/13/2021  PCP: [name], MD  Patient coming from: [place] have personally briefly reviewed patient's old medical records in [place]  Chief Complaint: Shortness of breath  HPI: Raymond Hood is a 51 y.o. male with medical history significant for COPD, T2DM, HTN, schizoaffective schizophrenia, rheumatoid arthritis, obesity, tobacco use, homelessness who presented to the ED for evaluation of shortness of breath.  Patient reports 3-4 days of shortness of breath with nonproductive cough.  He states that he ran out of his medications 1 day ago.  He was seen by [facility] nurse earlier today and vital signs showed that he was hypoxic with SPO2 sat 77% which was confirmed with postductal pulse ox.  He was noted to have wheezing throughout the lung fields.  EMS were called and he was brought to the ED for further evaluation.  SPO2 was 72% on EMS arrival, he was given a DuoNeb treatment and placed on 3 L O2 via [place] with improvement in SPO2 to 97%.  Patient currently on BiPAP during examination.  He reports his dyspnea is somewhat improved.  He says he is trying to sleep.  He denies any chest pain, nausea, vomiting, dysuria  ED Course:  Initial vitals showed BP 155/84, pulse 100, RR 20, temp 98.4 F, SPO2 94% on 3 L supplemental O2 via [place].  Labs show sodium 142, potassium 3.9, bicarb 36, BUN 19, creatinine 1.07, serum glucose 131, WBC 13.6, hemoglobin 15.1, platelets 198,000, BNP 184.6.  VBG showed pH 7.227, PCO2 88, PO2 35.  SARS-CoV-2 and influenza PCR negative.  Portable chest x-ray shows enlarged cardiac silhouette with pulm vascular congestion and bibasilar atelectasis.  Patient was given IV Solu-Medrol 125 mg and DuoNeb.  BiPAP was ordered.  The hospitalist service was consulted to admit for further evaluation and management.  Review of Systems: All systems reviewed and are negative  except as documented in history of present illness above.   Past Medical History:  Diagnosis Date   Bipolar 1 disorder, depressed ([place])    COPD (chronic obstructive pulmonary disease) ([place])    Depression with anxiety    Diabetes mellitus type II, non insulin dependent ([place]) 11/2020   A1c 7.3   Gastric ulcer    when on NSAIDs   GERD (gastroesophageal reflux disease)    HTN (hypertension)    OSA (obstructive sleep apnea)    not currently on CPAP   Rheumatoid arthritis ([place])    Right kidney mass, 2.8 cm 11/2020   Tobacco abuse     Past Surgical History:  Procedure Laterality Date   left index finger surgery Left     Social History:  reports that he has been smoking cigarettes. He has been smoking an average of 1 pack per day. He has never used smokeless tobacco. He reports that he does not currently use alcohol. He reports that he does not use drugs.  No Known Allergies  Family History  Problem Relation Age of Onset   Schizophrenia Mother    Diabetes Mellitus II Father      Prior to Admission medications   Medication Sig Start Date End Date Taking? Authorizing Provider  amLODipine (NORVASC) 5 MG tablet Take 1 tablet (5 mg total) by mouth once daily. 01/13/21   [name], MD  budesonide-formoterol (SYMBICORT) 80-4.5 MCG/ACT inhaler Inhale 2 puffs into the lungs 2 (two) times daily. 01/06/21   [name], MD  DULoxetine (CYMBALTA) 60 MG capsule Take 1 capsule (60 mg total) by mouth once daily. 01/13/21   Storm Frisk, MD  famotidine (PEPCID) 20 MG tablet Take 1 tablet (20 mg total) by mouth 2 (two) times daily. 01/13/21   Storm Frisk, MD  fluticasone (FLONASE) 50 MCG/ACT nasal spray Place 2 sprays into both nostrils daily. 01/13/21   Storm Frisk, MD  hydrOXYzine (ATARAX) 25 MG tablet Take 1 tablet (25 mg total) by mouth 3 (three) times daily as needed. 01/13/21   Storm Frisk, MD  metFORMIN (GLUCOPHAGE) 500 MG tablet Take 1 tablet (500 mg total) by  mouth 2 (two) times daily with a meal. 01/13/21   Storm Frisk, MD  risperidone (RISPERDAL) 4 MG tablet Take 1 tablet (4 mg total) by mouth once daily. 01/13/21   Storm Frisk, MD    Physical Exam: Vitals:   01/13/21 1902 01/13/21 1915 01/13/21 1920 01/13/21 1929  BP:    (!) 159/84  Pulse: 94 95 95 93  Resp:    (!) 24  Temp:      TempSrc:      SpO2: (!) 69% 100% 100% 100%   Constitutional: Morbidly obese man resting in bed currently wearing BiPAP.  Eyes: PERRL ENMT: Mucous membranes are moist. Posterior pharynx clear of any exudate or lesions. Neck: normal, supple, no masses. Respiratory: Expiratory wheezing throughout the lung fields.  Normal respiratory effort while on BiPAP. No accessory muscle use.  Cardiovascular: Regular rate and rhythm, no murmurs / rubs / gallops.  +1 bilateral lower extremity edema.  Abdomen: no tenderness, no masses palpated. No hepatosplenomegaly. Bowel sounds positive.  Musculoskeletal: no clubbing / cyanosis. No joint deformity upper and lower extremities. Good ROM, no contractures. Normal muscle tone.  Skin: Dry, no lesions, ulcers. No induration Neurologic: CN 2-12 grossly intact. Sensation intact. Strength 5/5 in all 4.  Psychiatric: Alert and oriented x 3.  Denies visual or auditory hallucinations.  Labs on Admission: I have personally reviewed following labs and imaging studies  CBC: Recent Labs  Lab 01/13/21 1656  WBC 13.6*  HGB 15.1  HCT 49.4  MCV 95.6  PLT 198   Basic Metabolic Panel: Recent Labs  Lab 01/13/21 1656  NA 142  K 3.9  CL 100  CO2 36*  GLUCOSE 131*  BUN 19  CREATININE 1.07  CALCIUM 8.0*   GFR: CrCl cannot be calculated (Unknown ideal weight.). Liver Function Tests: No results for input(s): AST, ALT, ALKPHOS, BILITOT, PROT, ALBUMIN in the last 168 hours. No results for input(s): LIPASE, AMYLASE in the last 168 hours. No results for input(s): AMMONIA in the last 168 hours. Coagulation Profile: No results  for input(s): INR, PROTIME in the last 168 hours. Cardiac Enzymes: No results for input(s): CKTOTAL, CKMB, CKMBINDEX, TROPONINI in the last 168 hours. BNP (last 3 results) No results for input(s): PROBNP in the last 8760 hours. HbA1C: No results for input(s): HGBA1C in the last 72 hours. CBG: No results for input(s): GLUCAP in the last 168 hours. Lipid Profile: No results for input(s): CHOL, HDL, LDLCALC, TRIG, CHOLHDL, LDLDIRECT in the last 72 hours. Thyroid Function Tests: No results for input(s): TSH, T4TOTAL, FREET4, T3FREE, THYROIDAB in the last 72 hours. Anemia Panel: No results for input(s): VITAMINB12, FOLATE, FERRITIN, TIBC, IRON, RETICCTPCT in the last 72 hours. Urine analysis:    Component Value Date/Time   COLORURINE YELLOW (A) 11/16/2020 2238   APPEARANCEUR HAZY (A) 11/16/2020 2238   LABSPEC >1.046 (H) 11/16/2020  [place] 5.0 11/16/2020 [place] 11/16/2020 2238   [place] 11/16/2020 2238   [place] 11/16/2020 2238   [place] 11/16/2020 2238   PROTEINUR NEGATIVE 11/16/2020 2238   NITRITE NEGATIVE 11/16/2020 2238   LEUKOCYTESUR NEGATIVE 11/16/2020 2238    Radiological Exams on Admission: DG Chest Port 1 View  Result Date: 01/13/2021 CLINICAL DATA:  Dyspnea. EXAM: PORTABLE CHEST 1 VIEW COMPARISON:  11/16/2020 FINDINGS: Cardiac enlargement with mild vascular congestion. Probable mild perihilar edema. No pleural effusion. Mild bibasilar atelectasis. IMPRESSION: Mild vascular congestion and perihilar edema compatible with congestive heart failure. Electronically Signed   By: Franchot Gallo M.D.   On: 01/13/2021 17:15    EKG: Personally reviewed. Sinus tachycardia, rate 100, no acute ischemic changes.  Rate is faster when compared to prior.  Assessment/Plan Principal Problem:   Acute respiratory failure with hypoxia and hypercapnia (HCC) Active Problems:   Schizo affective schizophrenia ([place])   Hypertension associated  with diabetes ([place])   COPD with acute exacerbation ([place])   Type 2 diabetes mellitus ([place])   Thierno Capretta is a 51 y.o. male with medical history significant for COPD, T2DM, HTN, schizoaffective schizophrenia, rheumatoid arthritis, obesity, tobacco use, homelessness who is admitted with acute hypoxic respiratory failure due to COPD exacerbation.  Acute respiratory failure with hypoxia and hypercapnia due to COPD exacerbation: Patient presenting with SPO2 72% on room air, currently requiring BiPAP at time of admission.  Does not use supplemental oxygen as an outpatient.  Diffuse expiratory wheezing present on admission. -Admit to stepdown unit, continue BiPAP as needed -Continue IV Solu-Medrol 40 mg twice daily -Continue scheduled DuoNebs with albuterol as needed -Start azithromycin, resume home Dulera in a.m.  Type II diabetes: Hold metformin and start on SSI.  Hypertension: Continue amlodipine 5 mg daily.  Schizoaffective schizophrenia: Resume home Risperdal 4 mg daily, duloxetine 60 mg daily, hydroxyzine as needed.  Does not appear to have established with behavioral health locally yet.  Tobacco use: Reports ongoing tobacco use.  Nicotine patch ordered.  DVT prophylaxis: Lovenox Code Status: Full code Family Communication: None available on admission Disposition Plan: Likely to homeless shelter pending clinical progress, TOC consult placed Consults called: None Level of care: Stepdown Admission status:  Status is: Observation  The patient remains OBS appropriate and will d/c before 2 midnights.   Zada Finders MD [facility]  If 7PM-7AM, please contact night-coverage [email or web address]  01/13/2021, 8:30 PM

## 2021-01-13 NOTE — Progress Notes (Signed)
Pt transported to ICU on BIPAP without complication.  

## 2021-01-13 NOTE — Progress Notes (Signed)
Pt seen by Dr Delford Field  He was sitting outside to smoke and his jacket got very wet. His sweatshirt also got wet around the neck.   He was given a new sweatshirt to try and hopefully it will fit, it is only a 3x.   He is almost out of his meds, except for the inhaler >> reordered.  He wants a pill box filled.  He is still hearing the voices in his head, they are getting a little louder.  He has not been to mental health yet, Lorin Picket is supposed to help him with transportation.  It was explained that his meds are not enough, but he needs a mental health professional.  We will figure  CBG 151  BP 150/96, HR 108, O2 sats 77%  this was confirmed with a second pulse ox.   On exam, lips were cyanotic, discharge in both eyes, sig wheezing throughout all lung fields.   EMS was called.  [name], PA-C 01/13/2021 3:27 PM

## 2021-01-13 NOTE — ED Notes (Signed)
O2 noted to be 70%, NRB placed on pt.

## 2021-01-14 DIAGNOSIS — Z6841 Body Mass Index (BMI) 40.0 and over, adult: Secondary | ICD-10-CM

## 2021-01-14 DIAGNOSIS — G4733 Obstructive sleep apnea (adult) (pediatric): Secondary | ICD-10-CM

## 2021-01-14 DIAGNOSIS — J441 Chronic obstructive pulmonary disease with (acute) exacerbation: Secondary | ICD-10-CM

## 2021-01-14 DIAGNOSIS — E1165 Type 2 diabetes mellitus with hyperglycemia: Secondary | ICD-10-CM

## 2021-01-14 DIAGNOSIS — E662 Morbid (severe) obesity with alveolar hypoventilation: Secondary | ICD-10-CM

## 2021-01-14 DIAGNOSIS — I5033 Acute on chronic diastolic (congestive) heart failure: Secondary | ICD-10-CM

## 2021-01-14 DIAGNOSIS — F259 Schizoaffective disorder, unspecified: Secondary | ICD-10-CM

## 2021-01-14 LAB — BASIC METABOLIC PANEL
Anion gap: 8 (ref 5–15)
BUN: 17 mg/dL (ref 6–20)
CO2: 32 mmol/L (ref 22–32)
Calcium: 7.8 mg/dL — ABNORMAL LOW (ref 8.9–10.3)
Chloride: 100 mmol/L (ref 98–111)
Creatinine, Ser: 0.75 mg/dL (ref 0.61–1.24)
GFR, Estimated: >60 mL/min (ref >60)
Glucose, Bld: 222 mg/dL — ABNORMAL HIGH (ref 70–99)
Potassium: 4.7 mmol/L (ref 3.5–5.1)
Sodium: 140 mmol/L (ref 135–145)

## 2021-01-14 LAB — CBC
HCT: 50.3 % (ref 39.0–52.0)
Hemoglobin: 15 g/dL (ref 13.0–17.0)
MCH: 28.6 pg (ref 26.0–34.0)
MCHC: 29.8 g/dL — ABNORMAL LOW (ref 30.0–36.0)
MCV: 95.8 fL (ref 80.0–100.0)
Platelets: 189 10*3/uL (ref 150–400)
RBC: 5.25 MIL/uL (ref 4.22–5.81)
RDW: 14.7 % (ref 11.5–15.5)
WBC: 11.5 10*3/uL — ABNORMAL HIGH (ref 4.0–10.5)
nRBC: 0.4 % — ABNORMAL HIGH (ref 0.0–0.2)

## 2021-01-14 LAB — GLUCOSE, CAPILLARY
Glucose-Capillary: 180 mg/dL — ABNORMAL HIGH (ref 70–99)
Glucose-Capillary: 224 mg/dL — ABNORMAL HIGH (ref 70–99)
Glucose-Capillary: 208 mg/dL — ABNORMAL HIGH (ref 70–99)
Glucose-Capillary: 250 mg/dL — ABNORMAL HIGH (ref 70–99)

## 2021-01-14 MED ORDER — ORAL CARE MOUTH RINSE: 15.0000 mL | Freq: Two times a day (BID) | OROMUCOSAL | Status: DC

## 2021-01-14 MED ORDER — FUROSEMIDE 10 MG/ML IJ SOLN
20.0000 mg | Freq: Two times a day (BID) | INTRAMUSCULAR | Status: DC
  Administered 2021-01-14 – 2021-01-17 (×7): 20 mg via INTRAVENOUS
  Filled 2021-01-14 (×7): qty 2

## 2021-01-14 MED ORDER — CHLORHEXIDINE GLUCONATE 0.12 % MT SOLN: 15.0000 mL | Freq: Two times a day (BID) | OROMUCOSAL | Status: DC

## 2021-01-14 NOTE — Assessment & Plan Note (Addendum)
--   Slowly improving.  Change to oral steroids, continue bronchodilators, antibiotic.  Continue BiPAP at night.

## 2021-01-14 NOTE — TOC Progression Note (Signed)
Transition of Care [facility]) - Progression Note    Patient Details  Name: Raymond Hood MRN: [id number] Date of Birth: 1970-09-22  Transition of Care [facility] CM/SW Contact  Purcell Mouton, RN Phone Number: 01/14/2021, 2:35 PM  Clinical Narrative:    Pt is homeless, oriented to self, chart reviewed. TOC will continue to follow.  Expected Discharge Plan: Homeless Shelter Barriers to Discharge: No Barriers Identified  Expected Discharge Plan and Services Expected Discharge Plan: Homeless Shelter       Living arrangements for the past 2 months: Homeless Shelter                                       Social Determinants of Health (SDOH) Interventions    Readmission Risk Interventions No flowsheet data found.

## 2021-01-14 NOTE — Progress Notes (Signed)
Inpatient Diabetes Program Recommendations  AACE/ADA: New Consensus Statement on Inpatient Glycemic Control (2015)  Target Ranges:  Prepandial:   less than 140 mg/dL      Peak postprandial:   less than 180 mg/dL (1-2 hours)      Critically ill patients:  140 - 180 mg/dL   Lab Results  Component Value Date   GLUCAP 224 (H) 01/14/2021   HGBA1C 7.3 (H) 10/16/2020    Latest Reference Range & Units 01/13/21 21:16 01/14/21 07:31 01/14/21 11:35  Glucose-Capillary 70 - 99 mg/dL 037 (H) 048 (H)  Novolog 4 units 224 (H)  Novolog 7 units   Review of Glycemic Control Homeless/COPD exacerbation Diabetes history: DM 2 Outpatient Diabetes medications: Metformin 500 mg bid Current orders for Inpatient glycemic control:  Novolog 0-20 units tid + hs  Solumedrol 40 mg bid  Inpatient Diabetes Program Recommendations:    -  Consider Semglee 10 units while on steroids  Thanks,  Christena Deem RN, MSN, BC-ADM Inpatient Diabetes Coordinator Team Pager [phone] (8a-5p)

## 2021-01-14 NOTE — Assessment & Plan Note (Addendum)
--   Resolved.  Continue to treat COPD exacerbation.  BiPAP at night.

## 2021-01-14 NOTE — Assessment & Plan Note (Addendum)
--   Patient not yet established with psychiatry in the area.  Continue Resporal and duloxetine as recommended by inpatient psychiatry consultation.  Outpatient establishment with psychiatric care recommended.

## 2021-01-14 NOTE — Assessment & Plan Note (Signed)
BiPAP nightly 

## 2021-01-14 NOTE — Assessment & Plan Note (Signed)
Recommend cessation. ?

## 2021-01-14 NOTE — Assessment & Plan Note (Addendum)
--   Echocardiogram October 2020 again showed normal LVEF, grade 1 diastolic dysfunction.  Significant volume overload now appears resolved.

## 2021-01-14 NOTE — Progress Notes (Signed)
Report called to Jama Flavors, 4W RN. All questions answered at this time. All patient belongings and paper chart transported with pt. Patient was transferred in the bed on tele and 2L O2 by 2 RN. Handoff completed; 4W will continue to care for pt.

## 2021-01-14 NOTE — Assessment & Plan Note (Addendum)
--   Appreciate TOC, likely to return to [facility] where he has been living for several months despite the inability to use his BiPAP nightly

## 2021-01-14 NOTE — Assessment & Plan Note (Addendum)
--   Hyperglycemic complicated by steroids, overall stable.  Apparently only on metformin as an outpatient.  Expect blood sugars to continue to improve as steroids are decreased. -- Continue sliding scale insulin.

## 2021-01-14 NOTE — Progress Notes (Signed)
Progress Note Patient: Raymond Hood [id number] DOB: 1970-06-08 DOA: 01/13/2021     0 DOS: the patient was seen and examined on 01/14/2021   Brief hospital course: 51 year old male PMH COPD, OSA, on BiPAP, noncompliant secondary to homeless status, schizoaffective disorder bipolar type, presented with shortness of breath and hypoxia.  Admitted for acute hypoxic respiratory failure secondary to COPD exacerbation complicated by OSA and probable OHS. --1/5 better today, off BiPAP, on 2 L nasal cannula, can transfer to progressive.  Assessment and Plan * Acute respiratory failure with hypoxia and hypercapnia (HCC)- (present on admission) -- Better today, breathing better, off BiPAP, appears stable on 2 L nasal cannula, mentation appears intact.  Transfer to progressive, continue BiPAP at night.  Continue treatment directed at COPD exacerbation.  COPD with acute exacerbation ([place])- (present on admission) -- Improving but still has wheezes on examination and does not feel back to baseline from a respiratory standpoint.  We will continue steroids, bronchodilators, antibiotics without change.  Continue BiPAP at night.  Acute on chronic diastolic CHF (congestive heart failure) ([place]) -- Echocardiogram October 2020 again showed normal LVEF, grade 1 diastolic dysfunction.  Patient has evidence of significant volume overload. -- Diuresis IV Lasix, follow BMP.  Likely contributing to respiratory status.  OSA (obstructive sleep apnea)- (present on admission) -- BiPAP nightly.  Type 2 diabetes mellitus with hyperglycemia (HCC) -- Hyperglycemic.  Apparently only on metformin as an outpatient.  Consult diabetes RN and will follow recs.  Favor oral medication given homeless state. -- Continue sliding scale insulin.    Homeless -- Disposition may be challenging as he has been unable to use BiPAP, may be having difficulty with medications.  Will consult TOC for in-depth review and for provision of resources as  able  Schizo affective schizophrenia [facility])- (present on admission) -- Patient not yet established with psychiatry in the area.  Continue Resporal and duloxetine for now.  Outpatient setting was planning on referral.  We will consult psychiatry to expedite his care.  TOC consult.  Obesity hypoventilation syndrome (HCC) -- Suspected diagnosis based on body habitus.  Weight loss as an outpatient recommended.  Morbid obesity with BMI of 50.0-59.9, adult [facility]) -- Consult dietitian  Hypertension associated with diabetes ([place])- (present on admission) -- Appears stable, continue amlodipine.  Cigarette smoker- (present on admission) -- Recommend cessation     Subjective:  Feels better, breathing better but not back to baseline.  Still short of breath.  Usually uses BiPAP, but has not been able to use this as an outpatient secondary to being homeless.  Reports swelling bilateral lower extremities.  Objective Vital signs were reviewed and unremarkable. Physical Exam Vitals reviewed.  Constitutional:      General: He is not in acute distress.    Appearance: He is ill-appearing. He is not toxic-appearing.  Cardiovascular:     Rate and Rhythm: Normal rate and regular rhythm.     Comments: Telemetry SR Pulmonary:     Effort: No respiratory distress.     Breath sounds: Wheezing present. No rhonchi or rales.     Comments: Mild increased respiratory effort Musculoskeletal:     Right lower leg: Edema (2+) present.     Left lower leg: Edema (2+) present.  Neurological:     General: No focal deficit present.     Mental Status: He is alert.  Psychiatric:        Mood and Affect: Mood normal.        Behavior: Behavior normal.  Data Reviewed:  BMP unremarkable, CBC stable  Family Communication: none  Disposition: Status is: Observation      Time spent: 40 minutes  Author: [name], MD 01/14/2021 12:31 PM  For on call review [email or web address].

## 2021-01-14 NOTE — Hospital Course (Addendum)
51 year old male PMH COPD, OSA, on BiPAP, noncompliant secondary to homeless status, schizoaffective disorder bipolar type, presented with shortness of breath and hypoxia.  Admitted for acute hypoxic respiratory failure secondary to COPD exacerbation complicated by OSA and probable OHS.  Slowly improving with standard therapy. -- 1/9 slowly improving, not quite back to respiratory baseline but anticipate discharge tomorrow

## 2021-01-14 NOTE — Assessment & Plan Note (Signed)
--   Appears stable, continue amlodipine.

## 2021-01-14 NOTE — Assessment & Plan Note (Signed)
--   Suspected diagnosis based on body habitus.  Weight loss as an outpatient recommended.

## 2021-01-14 NOTE — Assessment & Plan Note (Deleted)
-   Consult dietitian 

## 2021-01-15 DIAGNOSIS — Z5900 Homelessness unspecified: Secondary | ICD-10-CM

## 2021-01-15 LAB — BASIC METABOLIC PANEL
Anion gap: 10 (ref 5–15)
BUN: 26 mg/dL — ABNORMAL HIGH (ref 6–20)
CO2: 33 mmol/L — ABNORMAL HIGH (ref 22–32)
Calcium: 8.3 mg/dL — ABNORMAL LOW (ref 8.9–10.3)
Chloride: 94 mmol/L — ABNORMAL LOW (ref 98–111)
Creatinine, Ser: 0.94 mg/dL (ref 0.61–1.24)
GFR, Estimated: >60 mL/min (ref >60)
Glucose, Bld: 205 mg/dL — ABNORMAL HIGH (ref 70–99)
Potassium: 4.3 mmol/L (ref 3.5–5.1)
Sodium: 137 mmol/L (ref 135–145)

## 2021-01-15 LAB — GLUCOSE, CAPILLARY
Glucose-Capillary: 165 mg/dL — ABNORMAL HIGH (ref 70–99)
Glucose-Capillary: 218 mg/dL — ABNORMAL HIGH (ref 70–99)
Glucose-Capillary: 270 mg/dL — ABNORMAL HIGH (ref 70–99)
Glucose-Capillary: 218 mg/dL — ABNORMAL HIGH (ref 70–99)

## 2021-01-15 NOTE — Progress Notes (Signed)
NUTRITION NOTE  Consult received for assessment of nutrition requirements and status and for diet education. Reviewed MD note from 1/5 which indicates dx of morbid obesity and RD consulted related to this.   Review of TOC note from today indicates that patient was living at [facility] for >1 month and that he was living in another shelter prior to that. TOC note indicates expected discharge plan is homeless shelter.  Patient is currently on a Heart Healthy/Carb Modified diet and has been consuming 100% at all meals.   Weight 1/5 was 378 lb, weight on 10/14/20 was 369 lb, and weight on 09/28/20 was documented as 289 lb (question accuracy of this weight recording).   Education concerning weight loss not appropriate at this time given social/living situation and limited options of food at meals at homeless shelters.   RD will sign off. If additional nutrition-related needs arise, please re-consult RD.     Trenton Gammon, MS, RD, LDN Inpatient Clinical Dietitian RD pager # available in AMION  After hours/weekend pager # available in [facility]

## 2021-01-15 NOTE — Progress Notes (Signed)
Inpatient Diabetes Program Recommendations  AACE/ADA: New Consensus Statement on Inpatient Glycemic Control (2015)  Target Ranges:  Prepandial:   less than 140 mg/dL      Peak postprandial:   less than 180 mg/dL (1-2 hours)      Critically ill patients:  140 - 180 mg/dL   Lab Results  Component Value Date   GLUCAP 165 (H) 01/15/2021   HGBA1C 7.3 (H) 10/16/2020    Latest Reference Range & Units 01/13/21 21:16 01/14/21 07:31 01/14/21 11:35  Glucose-Capillary 70 - 99 mg/dL 194 (H) 180 (H)  Novolog 4 units 224 (H)  Novolog 7 units    Latest Reference Range & Units 01/14/21 07:31 01/14/21 11:35 01/14/21 16:04 01/14/21 21:38 01/15/21 07:41  Glucose-Capillary 70 - 99 mg/dL 180 (H) 224 (H) 208 (H) 250 (H) 165 (H)   Review of Glycemic Control Homeless/COPD exacerbation Diabetes history: DM 2 Outpatient Diabetes medications: Metformin 500 mg bid Current orders for Inpatient glycemic control:  Novolog 0-20 units tid + hs  Solumedrol 40 mg bid  Inpatient Diabetes Program Recommendations:    -  Consider Novolog 4 units tid meal coverage if eating >50% of meals  Glucose trends increase after steroid dose and meal intake.  Thanks,  Tama Headings RN, MSN, BC-ADM Inpatient Diabetes Coordinator Team Pager [phone] (8a-5p)

## 2021-01-15 NOTE — Consult Note (Signed)
[facility] Face-to-Face Psychiatry Consult   Reason for Consult: schizoaffective, assess meds and suggest outpt follow-up, pt not yet estabilished in this area Referring Physician: Dr. Irene Limbo Patient Identification: [place] MRN:  [id number] Principal Diagnosis: Acute respiratory failure with hypoxia and hypercapnia (HCC) Diagnosis:  Principal Problem:   Acute respiratory failure with hypoxia and hypercapnia (HCC) Active Problems:   Schizo affective schizophrenia (HCC)   Hypertension associated with diabetes (HCC)   COPD with acute exacerbation (HCC)   Morbid obesity with BMI of 50.0-59.9, adult (HCC)   Homeless   Cigarette smoker   Type 2 diabetes mellitus with hyperglycemia (HCC)   OSA (obstructive sleep apnea)   Obesity hypoventilation syndrome (HCC)   Acute on chronic diastolic CHF (congestive heart failure) (HCC)   Total Time spent with patient: 30 minutes  Subjective: "I need resources. Someone to write my medications. "  Raymond [place] is a 51 y.o. male patient who presented to [facility] with shortness of breath, hypoxia. The patient reports living in [place] area prior to moving to [place]. He states he is reaching out for help for medication and new resources for his psych. Patient reports his primary concern is homelessness, and he is unable to be compliant with his Bipap at night due to no access to outlets. He reports a past psychiatric history of schizophrenia. He reports he was previously managed by [facility] -_Dr. Artis Delay. He reports he was prescribed risperdal, cymbalta, and hydroxyzine. Patient reports a history of suicide attempt during his childhood years. He denies any current suicidal ideations, suicidal thoughts and or non suicidal self injurious behaviors. He does appear to be open to outpatient resources. WIll coordinate with TOC.  The patient could benefit from Social Work services to assist him in getting to a SNF/LTAC due to medical comorbidities and oxygen  demands.  On evaluation, the patient is alert and oriented x4, irritated but cooperative, and mood-congruent with affect. The patient does not appear to be responding to internal or external stimuli. Neither is the patient presenting with any delusional thinking. The patient is not presenting with any psychotic or paranoid behaviors.    Medical: Hypertension schizo affective   Past Psychiatric History: See above  Risk to Self:   Denies Risk to Others:   Denies Prior Inpatient Therapy:   Childhood, none recent Prior Outpatient Therapy:   Dr. Artis Delay at [facility].   Past Medical History:  Past Medical History:  Diagnosis Date   Bipolar 1 disorder, depressed (HCC)    COPD (chronic obstructive pulmonary disease) (HCC)    Depression with anxiety    Diabetes mellitus type II, non insulin dependent (HCC) 11/2020   A1c 7.3   Gastric ulcer    when on NSAIDs   GERD (gastroesophageal reflux disease)    HTN (hypertension)    OSA (obstructive sleep apnea)    not currently on CPAP   Rheumatoid arthritis (HCC)    Right kidney mass, 2.8 cm 11/2020   Tobacco abuse     Family History:  Family History  Problem Relation Age of Onset   Schizophrenia Mother    Diabetes Mellitus II Father    Family Psychiatric  History: Mother- Paranoid Schizophrenia Bipolar type. Social History:  Social History   Substance and Sexual Activity  Alcohol Use Not Currently     Social History   Substance and Sexual Activity  Drug Use Never    Social History   Socioeconomic History   Marital status: Single    Spouse name: Not  on file   Number of children: Not on file   Years of education: Not on file   Highest education level: Not on file  Occupational History   Not on file  Tobacco Use   Smoking status: Every Day    Packs/day: 1.00    Types: Cigarettes   Smokeless tobacco: Never  Vaping Use   Vaping Use: Never used  Substance and Sexual Activity   Alcohol use: Not Currently   Drug use: Never   Sexual  activity: Not on file  Other Topics Concern   Not on file  Social History Narrative   Not on file   Social Determinants of Health   Financial Resource Strain: Not on file  Food Insecurity: Not on file  Transportation Needs: Not on file  Physical Activity: Not on file  Stress: Not on file  Social Connections: Not on file   Additional Social History:    Allergies:  No Known Allergies  Labs:  Results for orders placed or performed during the hospital encounter of 01/13/21 (from the past 48 hour(s))  Basic metabolic panel     Status: Abnormal   Collection Time: 01/13/21  4:56 PM  Result Value Ref Range   Sodium 142 135 - 145 mmol/L   Potassium 3.9 3.5 - 5.1 mmol/L   Chloride 100 98 - 111 mmol/L   CO2 36 (H) 22 - 32 mmol/L   Glucose, Bld 131 (H) 70 - 99 mg/dL    Comment: Glucose reference range applies only to samples taken after fasting for at least 8 hours.   BUN 19 6 - 20 mg/dL   Creatinine, Ser 4.09 0.61 - 1.24 mg/dL   Calcium 8.0 (L) 8.9 - 10.3 mg/dL   GFR, Estimated >81 >19 mL/min    Comment: (NOTE) Calculated using the CKD-EPI Creatinine Equation (2021)    Anion gap 6 5 - 15    Comment: Performed at [facility], [address] [place], [place] [address]  CBC     Status: Abnormal   Collection Time: 01/13/21  4:56 PM  Result Value Ref Range   WBC 13.6 (H) 4.0 - 10.5 K/uL   RBC 5.17 4.22 - 5.81 MIL/uL   Hemoglobin 15.1 13.0 - 17.0 g/dL   HCT 95.6 21.3 - 08.6 %   MCV 95.6 80.0 - 100.0 fL   MCH 29.2 26.0 - 34.0 pg   MCHC 30.6 30.0 - 36.0 g/dL   RDW 57.8 46.9 - 62.9 %   Platelets 198 150 - 400 K/uL   nRBC 0.4 (H) 0.0 - 0.2 %    Comment: Performed at [facility], [address] [place], [place] [address]  Brain natriuretic peptide     Status: Abnormal   Collection Time: 01/13/21  4:56 PM  Result Value Ref Range   B Natriuretic Peptide 184.6 (H) 0.0 - 100.0 pg/mL    Comment: Performed at [facility], [address] [place], [place] [address]  Resp Panel by RT-PCR (Flu A&B, Covid) Nasopharyngeal Swab     Status: None   Collection Time: 01/13/21  5:02 PM   Specimen: Nasopharyngeal Swab; Nasopharyngeal(NP) swabs in vial transport medium  Result Value Ref Range   SARS Coronavirus 2 by RT PCR NEGATIVE NEGATIVE    Comment: (NOTE) SARS-CoV-2 target nucleic acids are NOT DETECTED.  The SARS-CoV-2 RNA is generally detectable in upper respiratory specimens during the acute phase of infection. The lowest concentration of SARS-CoV-2 viral copies this  assay can detect is 138 copies/mL. A negative result does not preclude SARS-Cov-2 infection and should not be used as the sole basis for treatment or other patient management decisions. A negative result may occur with  improper specimen collection/handling, submission of specimen other than nasopharyngeal swab, presence of viral mutation(s) within the areas targeted by this assay, and inadequate number of viral copies(<138 copies/mL). A negative result must be combined with clinical observations, patient history, and epidemiological information. The expected result is Negative.  Fact Sheet for Patients:  BloggerCourse.com  Fact Sheet for Healthcare Providers:  SeriousBroker.it  This test is no t yet approved or cleared by the [place] FDA and  has been authorized for detection and/or diagnosis of SARS-CoV-2 by FDA under an Emergency Use Authorization (EUA). This EUA will remain  in effect (meaning this test can be used) for the duration of the COVID-19 declaration under Section 564(b)(1) of the Act, 21 U.S.C.section 360bbb-3(b)(1), unless the authorization is terminated  or revoked sooner.       Influenza A by PCR NEGATIVE NEGATIVE   Influenza B by PCR NEGATIVE NEGATIVE    Comment: (NOTE) The Xpert Xpress SARS-CoV-2/FLU/RSV plus assay is intended as an aid in the diagnosis of influenza  from Nasopharyngeal swab specimens and should not be used as a sole basis for treatment. Nasal washings and aspirates are unacceptable for Xpert Xpress SARS-CoV-2/FLU/RSV testing.  Fact Sheet for Patients: BloggerCourse.com  Fact Sheet for Healthcare Providers: SeriousBroker.it  This test is not yet approved or cleared by the [place] FDA and has been authorized for detection and/or diagnosis of SARS-CoV-2 by FDA under an Emergency Use Authorization (EUA). This EUA will remain in effect (meaning this test can be used) for the duration of the COVID-19 declaration under Section 564(b)(1) of the Act, 21 U.S.C. section 360bbb-3(b)(1), unless the authorization is terminated or revoked.  Performed at [facility], [address] [place], [place] [address]   Blood gas, venous (at [facility] and [facility], not at [facility])     Status: Abnormal   Collection Time: 01/13/21  6:24 PM  Result Value Ref Range   pH, Ven 7.227 (L) 7.250 - 7.430   pCO2, Ven 88.0 (HH) 44.0 - 60.0 mmHg    Comment: CRITICAL RESULT CALLED TO, READ BACK BY AND VERIFIED WITH: STACY RN 01/13/2021 @ 1843 BY COTEJ    pO2, Ven 35.4 32.0 - 45.0 mmHg   Bicarbonate 35.3 (H) 20.0 - 28.0 mmol/L   Acid-Base Excess 4.6 (H) 0.0 - 2.0 mmol/L   O2 Saturation 62.2 %   Patient temperature 98.6     Comment: Performed at [facility], [address] [place], [place] [address]  MRSA Next Gen by PCR, Nasal     Status: None   Collection Time: 01/13/21  8:36 PM   Specimen: Nasal Mucosa; Nasal Swab  Result Value Ref Range   MRSA by PCR Next Gen NOT DETECTED NOT DETECTED    Comment: (NOTE) The GeneXpert MRSA Assay (FDA approved for NASAL specimens only), is one component of a comprehensive MRSA colonization surveillance program. It is not intended to diagnose MRSA infection nor to guide or monitor treatment for MRSA infections. Test performance is not FDA  approved in patients less than 26 years old. Performed at [facility], [address] [place], [place] [address]   Glucose, capillary     Status: Abnormal   Collection Time: 01/13/21  9:16 PM  Result Value Ref Range  Glucose-Capillary 194 (H) 70 - 99 mg/dL    Comment: Glucose reference range applies only to samples taken after fasting for at least 8 hours.   Comment 1 Notify RN    Comment 2 Document in Chart   Basic metabolic panel     Status: Abnormal   Collection Time: 01/14/21  2:46 AM  Result Value Ref Range   Sodium 140 135 - 145 mmol/L   Potassium 4.7 3.5 - 5.1 mmol/L    Comment: DELTA CHECK NOTED   Chloride 100 98 - 111 mmol/L   CO2 32 22 - 32 mmol/L   Glucose, Bld 222 (H) 70 - 99 mg/dL    Comment: Glucose reference range applies only to samples taken after fasting for at least 8 hours.   BUN 17 6 - 20 mg/dL   Creatinine, Ser 1.610.75 0.61 - 1.24 mg/dL   Calcium 7.8 (L) 8.9 - 10.3 mg/dL   GFR, Estimated >09>60 >60>60 mL/min    Comment: (NOTE) Calculated using the CKD-EPI Creatinine Equation (2021)    Anion gap 8 5 - 15    Comment: Performed at [facility], [address] [place], [place] [address]  CBC     Status: Abnormal   Collection Time: 01/14/21  2:46 AM  Result Value Ref Range   WBC 11.5 (H) 4.0 - 10.5 K/uL   RBC 5.25 4.22 - 5.81 MIL/uL   Hemoglobin 15.0 13.0 - 17.0 g/dL   HCT 81.150.3 91.439.0 - 78.252.0 %   MCV 95.8 80.0 - 100.0 fL   MCH 28.6 26.0 - 34.0 pg   MCHC 29.8 (L) 30.0 - 36.0 g/dL   RDW 95.614.7 21.311.5 - 08.615.5 %   Platelets 189 150 - 400 K/uL   nRBC 0.4 (H) 0.0 - 0.2 %    Comment: Performed at [facility], [address] [place], [place] [address]  Glucose, capillary     Status: Abnormal   Collection Time: 01/14/21  7:31 AM  Result Value Ref Range   Glucose-Capillary 180 (H) 70 - 99 mg/dL    Comment: Glucose reference range applies only to samples taken after fasting for at least 8 hours.  Glucose, capillary      Status: Abnormal   Collection Time: 01/14/21 11:35 AM  Result Value Ref Range   Glucose-Capillary 224 (H) 70 - 99 mg/dL    Comment: Glucose reference range applies only to samples taken after fasting for at least 8 hours.  Glucose, capillary     Status: Abnormal   Collection Time: 01/14/21  4:04 PM  Result Value Ref Range   Glucose-Capillary 208 (H) 70 - 99 mg/dL    Comment: Glucose reference range applies only to samples taken after fasting for at least 8 hours.  Glucose, capillary     Status: Abnormal   Collection Time: 01/14/21  9:38 PM  Result Value Ref Range   Glucose-Capillary 250 (H) 70 - 99 mg/dL    Comment: Glucose reference range applies only to samples taken after fasting for at least 8 hours.  Basic metabolic panel     Status: Abnormal   Collection Time: 01/15/21  4:25 AM  Result Value Ref Range   Sodium 137 135 - 145 mmol/L   Potassium 4.3 3.5 - 5.1 mmol/L   Chloride 94 (L) 98 - 111 mmol/L   CO2 33 (H) 22 - 32 mmol/L   Glucose, Bld 205 (H) 70 - 99 mg/dL    Comment: Glucose reference range applies only  to samples taken after fasting for at least 8 hours.   BUN 26 (H) 6 - 20 mg/dL   Creatinine, Ser 1.61 0.61 - 1.24 mg/dL   Calcium 8.3 (L) 8.9 - 10.3 mg/dL   GFR, Estimated >09 >60 mL/min    Comment: (NOTE) Calculated using the CKD-EPI Creatinine Equation (2021)    Anion gap 10 5 - 15    Comment: Performed at [facility], [address] [place], [place] [address]  Glucose, capillary     Status: Abnormal   Collection Time: 01/15/21  7:41 AM  Result Value Ref Range   Glucose-Capillary 165 (H) 70 - 99 mg/dL    Comment: Glucose reference range applies only to samples taken after fasting for at least 8 hours.  Glucose, capillary     Status: Abnormal   Collection Time: 01/15/21 11:27 AM  Result Value Ref Range   Glucose-Capillary 218 (H) 70 - 99 mg/dL    Comment: Glucose reference range applies only to samples taken after fasting for at least 8  hours.    Current Facility-Administered Medications  Medication Dose Route Frequency Provider Last Rate Last Admin   acetaminophen (TYLENOL) tablet 650 mg  650 mg Oral Q6H PRN Standley Brooking, MD       Or   acetaminophen (TYLENOL) suppository 650 mg  650 mg Rectal Q6H PRN Standley Brooking, MD       albuterol (PROVENTIL) (2.5 MG/3ML) 0.083% nebulizer solution 2.5 mg  2.5 mg Nebulization Q2H PRN Standley Brooking, MD       amLODipine (NORVASC) tablet 5 mg  5 mg Oral Daily Standley Brooking, MD   5 mg at 01/15/21 0806   azithromycin (ZITHROMAX) tablet 500 mg  500 mg Oral Daily Standley Brooking, MD   500 mg at 01/14/21 2130   chlorhexidine (PERIDEX) 0.12 % solution 15 mL  15 mL Mouth Rinse BID Standley Brooking, MD   15 mL at 01/15/21 8119   Chlorhexidine Gluconate Cloth 2 % PADS 6 each  6 each Topical Daily Standley Brooking, MD   6 each at 01/15/21 0806   DULoxetine (CYMBALTA) DR capsule 60 mg  60 mg Oral Daily Standley Brooking, MD   60 mg at 01/15/21 0808   enoxaparin (LOVENOX) injection 80 mg  80 mg Subcutaneous Q24H Standley Brooking, MD   80 mg at 01/15/21 0807   famotidine (PEPCID) tablet 20 mg  20 mg Oral BID Standley Brooking, MD   20 mg at 01/15/21 1478   furosemide (LASIX) injection 20 mg  20 mg Intravenous BID Standley Brooking, MD   20 mg at 01/15/21 0759   guaiFENesin (MUCINEX) 12 hr tablet 600 mg  600 mg Oral BID Standley Brooking, MD   600 mg at 01/15/21 2956   hydrOXYzine (ATARAX) tablet 25 mg  25 mg Oral TID PRN Standley Brooking, MD   25 mg at 01/14/21 2130   insulin aspart (novoLOG) injection 0-20 Units  0-20 Units Subcutaneous TID WC Standley Brooking, MD   7 Units at 01/15/21 1245   insulin aspart (novoLOG) injection 0-5 Units  0-5 Units Subcutaneous QHS Standley Brooking, MD   2 Units at 01/14/21 2153   ipratropium-albuterol (DUONEB) 0.5-2.5 (3) MG/3ML nebulizer solution 3 mL  3 mL Nebulization Q6H Standley Brooking, MD   3 mL at 01/15/21 0753   MEDLINE  mouth rinse  15 mL Mouth Rinse q12n4p Standley Brooking, MD  15 mL at 01/14/21 1732   methylPREDNISolone sodium succinate (SOLU-MEDROL) 40 mg/mL injection 40 mg  40 mg Intravenous Q12H Standley Brooking, MD   40 mg at 01/15/21 0805   mometasone-formoterol (DULERA) 100-5 MCG/ACT inhaler 2 puff  2 puff Inhalation BID Standley Brooking, MD   2 puff at 01/15/21 0801   nicotine (NICODERM CQ - dosed in mg/24 hours) patch 21 mg  21 mg Transdermal Daily Standley Brooking, MD   21 mg at 01/15/21 0807   ondansetron (ZOFRAN) tablet 4 mg  4 mg Oral Q6H PRN Standley Brooking, MD       Or   ondansetron [facility]) injection 4 mg  4 mg Intravenous Q6H PRN Standley Brooking, MD       risperiDONE (RISPERDAL) tablet 4 mg  4 mg Oral Daily Standley Brooking, MD   4 mg at 01/15/21 1610   senna-docusate (Senokot-S) tablet 1 tablet  1 tablet Oral QHS PRN Standley Brooking, MD       sodium chloride flush (NS) 0.9 % injection 3 mL  3 mL Intravenous Q12H Standley Brooking, MD   3 mL at 01/15/21 0808    Musculoskeletal: Strength & Muscle Tone: within normal limits Gait & Station: normal Patient leans: N/A  Psychiatric Specialty Exam:  Presentation  General Appearance: Appropriate for Environment; Fairly Groomed  Eye Contact:Fair  Speech:Normal Rate; Slow  Speech Volume:Normal  Handedness:Right   Mood and Affect  Mood:Depressed  Affect:Appropriate; Congruent  Thought Process  Thought Processes:Coherent; Linear; Goal Directed  Descriptions of Associations:Intact  Orientation:Full (Time, Place and Person)  Thought Content:Logical  History of Schizophrenia/Schizoaffective disorder:No  Duration of Psychotic Symptoms:No data recorded Hallucinations:Hallucinations: None  Ideas of Reference:None  Suicidal Thoughts:Suicidal Thoughts: No  Homicidal Thoughts:Homicidal Thoughts: No   Sensorium  Memory:Immediate Fair; Recent Fair; Remote Fair  Judgment:Fair  Insight:Fair   Executive  Functions  Concentration:Fair  Attention Span:Fair  Recall:Fair  Fund of Knowledge:Fair  Language:Fair   Psychomotor Activity  Psychomotor Activity:Psychomotor Activity: Normal   Assets  Assets:Communication Skills; Desire for Improvement; Resilience; Social Support   Sleep  Sleep:Sleep: Good   Physical Exam: Physical Exam Vitals and nursing note reviewed.  Constitutional:      Appearance: Normal appearance. He is obese.  HENT:     Head: Normocephalic and atraumatic.     Nose: Nose normal.  Cardiovascular:     Rate and Rhythm: Normal rate.     Pulses: Normal pulses.  Pulmonary:     Effort: Pulmonary effort is normal.  Musculoskeletal:        General: Normal range of motion.     Cervical back: Normal range of motion and neck supple.  Neurological:     General: No focal deficit present.     Mental Status: He is alert and oriented to person, place, and time.  Psychiatric:        Attention and Perception: Attention and perception normal.        Mood and Affect: Mood is depressed. Affect is blunt.        Speech: Speech normal.        Behavior: Behavior normal.        Thought Content: Thought content includes suicidal ideation.        Cognition and Memory: Cognition and memory normal.        Judgment: Judgment normal.   Review of Systems  Psychiatric/Behavioral:  Negative for depression and suicidal ideas. The patient is not nervous/anxious.  All other systems reviewed and are negative. Blood pressure 123/77, pulse 87, temperature 97.8 F (36.6 C), temperature source Oral, resp. rate 18, height 5\' 8"  (1.727 m), weight (!) 171.3 kg, SpO2 94 %. Body mass index is 57.42 kg/m.  Treatment Plan Summary: -Continue current medications. -will update AVS to reflect [facility] information. -Consider TOC consult for referrals to shelters or long term care options as patient has multiple medical comorbidities and oxygen demands.  -Patient does not meet inpatient criteria, will  sign off at this time. Patients primary concern is establishing new resources and housing. Will sign off at this time.  Disposition: No evidence of imminent risk to self or others at present.   Patient does not meet criteria for psychiatric inpatient admission. Supportive therapy provided about ongoing stressors. Discussed crisis plan, support from social network, calling 911, coming to the Emergency Department, and calling Suicide Hotline.   Maryagnes Amos, FNP 01/15/2021 2:45 PM

## 2021-01-15 NOTE — Progress Notes (Signed)
Progress Note   Patient: [place] [id number] DOB: 05-02-1970 DOA: 01/13/2021     1 DOS: the patient was seen and examined on 01/15/2021   Brief hospital course: 51 year old male PMH COPD, OSA, on BiPAP, noncompliant secondary to homeless status, schizoaffective disorder bipolar type, presented with shortness of breath and hypoxia.  Admitted for acute hypoxic respiratory failure secondary to COPD exacerbation complicated by OSA and probable OHS. --1/5 better today, off BiPAP, on 2 L nasal cannula, can transfer to progressive.  Assessment and Plan * Acute respiratory failure with hypoxia and hypercapnia (HCC)- (present on admission) -- Better today, breathing better, off BiPAP, appears stable on 2 L nasal cannula, mentation appears intact.  Transfer to progressive, continue BiPAP at night.  Continue treatment directed at COPD exacerbation.  COPD with acute exacerbation ([place])- (present on admission) -- Improving but still has wheezes on examination and does not feel back to baseline from a respiratory standpoint.  We will continue steroids, bronchodilators, antibiotics without change.  Continue BiPAP at night.  Acute on chronic diastolic CHF (congestive heart failure) ([place]) -- Echocardiogram October 2020 again showed normal LVEF, grade 1 diastolic dysfunction.  Patient has evidence of significant volume overload. -- Diuresis IV Lasix, follow BMP.  Likely contributing to respiratory status.  OSA (obstructive sleep apnea)- (present on admission) -- BiPAP nightly.  Type 2 diabetes mellitus with hyperglycemia (HCC) -- Hyperglycemic.  Apparently only on metformin as an outpatient.  Consult diabetes RN and will follow recs.  Favor oral medication given homeless state. -- Continue sliding scale insulin.    Homeless -- Disposition may be challenging as he has been unable to use BiPAP, may be having difficulty with medications.  Will consult TOC for in-depth review and for provision of  resources as able  Schizo affective schizophrenia [facility])- (present on admission) -- Patient not yet established with psychiatry in the area.  Continue Resporal and duloxetine for now.  Outpatient setting was planning on referral.  We will consult psychiatry to expedite his care.  TOC consult.  Obesity hypoventilation syndrome (HCC) -- Suspected diagnosis based on body habitus.  Weight loss as an outpatient recommended.  Morbid obesity with BMI of 50.0-59.9, adult [facility]) -- Consult dietitian  Hypertension associated with diabetes ([place])- (present on admission) -- Appears stable, continue amlodipine.  Cigarette smoker- (present on admission) -- Recommend cessation     Subjective:  Feel better, breathing better  Objective Vital signs were reviewed and unremarkable. Physical Exam Vitals reviewed.  Constitutional:      General: He is not in acute distress.    Appearance: He is not ill-appearing or toxic-appearing.  Cardiovascular:     Rate and Rhythm: Normal rate and regular rhythm.     Heart sounds: No murmur heard. Pulmonary:     Effort: Pulmonary effort is normal. No respiratory distress.     Breath sounds: Wheezing present. No rhonchi or rales.  Musculoskeletal:     Right lower leg: Edema present.     Left lower leg: Edema present.  Neurological:     Mental Status: He is alert.  Psychiatric:        Mood and Affect: Mood normal.        Behavior: Behavior normal.   Data Reviewed:  BMP stable  Family Communication: none  Disposition: Status is: Inpatient  Remains inpatient appropriate because: Treatment for COPD exacerbation         Time spent: 35 minutes  Author: [name], MD 01/15/2021 8:59 PM  For on call  review [email or web address].

## 2021-01-15 NOTE — TOC Progression Note (Signed)
Transition of Care [facility](TOC) - Progression Note    Patient Details  Name: Raymond Hood  MRN: [id number] Date of Birth: 09/15/1970  Transition of Care [facility](TOC) CM/SW Contact  Geni BersMcGibboney, Nyah Shepherd, RN Phone Number: 01/15/2021, 12:22 PM  Clinical Narrative:     Spoke with pt in length. This pt has PCP, [facility]. His Bipap machine is in a safe place. He had to leave the place in stayed in before [facility] where he could use [facility]. [facility]. He has no family here and [facility].   Expected Discharge Plan: Homeless Shelter Barriers to Discharge: No Barriers Identified  Expected Discharge Plan and Services Expected Discharge Plan: Homeless Shelter       Living arrangements for the past 2 months: Homeless Shelter                                       Social Determinants of Health (SDOH) Interventions    Readmission Risk Interventions No flowsheet data found.

## 2021-01-15 NOTE — Plan of Care (Signed)
  Problem: Education: Goal: Knowledge of disease or condition will improve Outcome: Progressing Goal: Knowledge of the prescribed therapeutic regimen will improve Outcome: Progressing   Problem: Activity: Goal: Ability to tolerate increased activity will improve Outcome: Progressing Goal: Will verbalize the importance of balancing activity with adequate rest periods Outcome: Progressing   Problem: Respiratory: Goal: Ability to maintain a clear airway will improve Outcome: Progressing Goal: Levels of oxygenation will improve Outcome: Progressing Goal: Ability to maintain adequate ventilation will improve Outcome: Progressing   

## 2021-01-15 NOTE — Care Management (Signed)
30 Day Passar Note  RE:  [id number]       Date of Birth: __1972-03-22   Date:  01/15/2021      To Whom It May Concern:  Please be advised that the above-named patient will require a short-term nursing home stay - anticipated 30 days or less for rehabilitation and strengthening.  The plan is for return home.   Geni Bers, RN, CM [phone]

## 2021-01-16 LAB — BASIC METABOLIC PANEL
Anion gap: 8 (ref 5–15)
BUN: 28 mg/dL — ABNORMAL HIGH (ref 6–20)
CO2: 36 mmol/L — ABNORMAL HIGH (ref 22–32)
Calcium: 8.2 mg/dL — ABNORMAL LOW (ref 8.9–10.3)
Chloride: 95 mmol/L — ABNORMAL LOW (ref 98–111)
Creatinine, Ser: 1.06 mg/dL (ref 0.61–1.24)
GFR, Estimated: >60 mL/min (ref >60)
Glucose, Bld: 129 mg/dL — ABNORMAL HIGH (ref 70–99)
Potassium: 4 mmol/L (ref 3.5–5.1)
Sodium: 139 mmol/L (ref 135–145)

## 2021-01-16 LAB — GLUCOSE, CAPILLARY
Glucose-Capillary: 143 mg/dL — ABNORMAL HIGH (ref 70–99)
Glucose-Capillary: 184 mg/dL — ABNORMAL HIGH (ref 70–99)
Glucose-Capillary: 220 mg/dL — ABNORMAL HIGH (ref 70–99)
Glucose-Capillary: 211 mg/dL — ABNORMAL HIGH (ref 70–99)

## 2021-01-16 MED ORDER — IPRATROPIUM-ALBUTEROL 0.5-2.5 (3) MG/3ML IN SOLN
3.0000 mL | Freq: Three times a day (TID) | RESPIRATORY_TRACT | Status: DC
  Administered 2021-01-16 – 2021-01-19 (×11): 3 mL via RESPIRATORY_TRACT
  Filled 2021-01-16 (×11): qty 3

## 2021-01-16 NOTE — Progress Notes (Signed)
Progress Note   Patient: Raymond Hood [id number] DOB: 08/28/1970 DOA: 01/13/2021     2 DOS: the patient was seen and examined on 01/16/2021   Brief hospital course: 51 year old male PMH COPD, OSA, on BiPAP, noncompliant secondary to homeless status, schizoaffective disorder bipolar type, presented with shortness of breath and hypoxia.  Admitted for acute hypoxic respiratory failure secondary to COPD exacerbation complicated by OSA and probable OHS. --1/5 better today, off BiPAP, on 2 L nasal cannula, can transfer to progressive. -- 1/6 continues to improve, did use BiPAP at night.  Discussed with TOC to secure outpatient resources needed.   Assessment and Plan * Acute respiratory failure with hypoxia and hypercapnia (HCC)- (present on admission) -- Acute issues improving, tolerating BiPAP at night, continue nasal cannula oxygen during the day. Continue treatment directed at COPD exacerbation.  COPD with acute exacerbation ([place])- (present on admission) -- Continues to improve, but still has wheezing, will continue steroids, bronchodilators, antibiotics without change.  Continue BiPAP at night.  Acute on chronic diastolic CHF (congestive heart failure) ([place]) -- Echocardiogram October 2020 again showed normal LVEF, grade 1 diastolic dysfunction.  Patient has evidence of significant volume overload. -- Decreased volume overload today.  Will continue diuresis IV, follow BMP.  Likely contributed to to respiratory status.  OSA (obstructive sleep apnea)- (present on admission) -- BiPAP nightly.  Type 2 diabetes mellitus with hyperglycemia (HCC) -- Hyperglycemic.  Apparently only on metformin as an outpatient.  Consult diabetes RN and will follow recs.  Favor oral medication given homeless state. -- Continue sliding scale insulin.    Homeless -- Disposition may be challenging as he has been unable to use BiPAP, may be having difficulty with medications.  Will consult TOC for in-depth review  and for provision of resources as able  Schizo affective schizophrenia [facility])- (present on admission) -- Patient not yet established with psychiatry in the area.  Continue Resporal and duloxetine as recommended by inpatient psychiatry consultation.  Outpatient establishment with psychiatric care recommended.  Obesity hypoventilation syndrome (HCC) -- Suspected diagnosis based on body habitus.  Weight loss as an outpatient recommended.  Morbid obesity with BMI of 50.0-59.9, adult [facility]) -- Consult dietitian  Hypertension associated with diabetes ([place])- (present on admission) -- Appears stable, continue amlodipine.  Cigarette smoker- (present on admission) -- Recommend cessation     Subjective:  Feels short of breath still, not back to baseline.  Objective Vital signs were reviewed and unremarkable. Physical Exam Vitals reviewed.  Constitutional:      General: He is not in acute distress.    Appearance: He is not ill-appearing or toxic-appearing.  Cardiovascular:     Rate and Rhythm: Normal rate and regular rhythm.     Heart sounds: No murmur heard. Pulmonary:     Effort: No respiratory distress.     Breath sounds: No wheezing, rhonchi or rales.     Comments: Effort mildly increased Musculoskeletal:     Comments: Decreased BLE edema  Neurological:     Mental Status: He is alert.  Psychiatric:        Mood and Affect: Mood normal.        Behavior: Behavior normal.     Data Reviewed:  -2100 UOP -3.1 L since admit BMP noted, potassium within normal limits, creatinine BUN slightly up, will continue diuresis today and recheck BMP in a.m.  Family Communication:   Disposition: Status is: Inpatient  Remains inpatient appropriate because: COPD exacerbation, hypoxia, CHF needing diuresis  Time spent: 25 minutes  Author: [name], MD 01/16/2021 5:50 PM  For on call review [email or web address].

## 2021-01-16 NOTE — Plan of Care (Signed)
°  Problem: Education: °Goal: Knowledge of disease or condition will improve °Outcome: Progressing °Goal: Knowledge of the prescribed therapeutic regimen will improve °Outcome: Progressing °Goal: Individualized Educational Video(s) °Outcome: Progressing °  °

## 2021-01-17 LAB — BASIC METABOLIC PANEL
Anion gap: 11 (ref 5–15)
BUN: 27 mg/dL — ABNORMAL HIGH (ref 6–20)
CO2: 33 mmol/L — ABNORMAL HIGH (ref 22–32)
Calcium: 8.3 mg/dL — ABNORMAL LOW (ref 8.9–10.3)
Chloride: 93 mmol/L — ABNORMAL LOW (ref 98–111)
Creatinine, Ser: 0.85 mg/dL (ref 0.61–1.24)
GFR, Estimated: >60 mL/min (ref >60)
Glucose, Bld: 202 mg/dL — ABNORMAL HIGH (ref 70–99)
Potassium: 4.4 mmol/L (ref 3.5–5.1)
Sodium: 137 mmol/L (ref 135–145)

## 2021-01-17 LAB — GLUCOSE, CAPILLARY
Glucose-Capillary: 169 mg/dL — ABNORMAL HIGH (ref 70–99)
Glucose-Capillary: 222 mg/dL — ABNORMAL HIGH (ref 70–99)
Glucose-Capillary: 223 mg/dL — ABNORMAL HIGH (ref 70–99)
Glucose-Capillary: 201 mg/dL — ABNORMAL HIGH (ref 70–99)
Glucose-Capillary: 179 mg/dL — ABNORMAL HIGH (ref 70–99)

## 2021-01-17 MED ORDER — METHYLPREDNISOLONE SODIUM SUCC 40 MG IJ SOLR
40.0000 mg | Freq: Every day | INTRAMUSCULAR | Status: DC
  Administered 2021-01-18: 40 mg via INTRAVENOUS
  Filled 2021-01-17: qty 1

## 2021-01-17 NOTE — Progress Notes (Addendum)
°  Progress Note   Patient: Raymond Hood [id number] DOB: July 03, 1970 DOA: 01/13/2021     3 DOS: the patient was seen and examined on 01/17/2021   Brief hospital course: 51 year old male PMH COPD, OSA, on BiPAP, noncompliant secondary to homeless status, schizoaffective disorder bipolar type, presented with shortness of breath and hypoxia.  Admitted for acute hypoxic respiratory failure secondary to COPD exacerbation complicated by OSA and probable OHS.  Slowly improving with standard therapy. -- 1/7 tolerating BiPAP at night, currently cannot use it living arrangement.  Decrease steroids.  Probably changed to oral tomorrow.  Likely discharge 1 to 2 days.   Assessment and Plan * Acute respiratory failure with hypoxia and hypercapnia ([place])- (present on admission) -- Resolved.  Continue to treat COPD exacerbation.  BiPAP at night.  COPD with acute exacerbation ([place])- (present on admission) -- Slowly improving.  Decrease steroids.  Continue bronchodilators, antibiotic.  Continue BiPAP at night.  Acute on chronic diastolic CHF (congestive heart failure) ([place]) -- Echocardiogram October 2020 again showed normal LVEF, grade 1 diastolic dysfunction.  Significant volume overload decreasing. -- Lower extremity edema much decreased, stop Lasix today, check BMP and follow in AM.  OSA (obstructive sleep apnea)- (present on admission) -- BiPAP nightly.  Type 2 diabetes mellitus with hyperglycemia (HCC) -- Hyperglycemic complicated by steroids, overall stable.  Apparently only on metformin as an outpatient.  Expect blood sugars to improve with decrease steroids. -- Continue sliding scale insulin.    Homeless -- Disposition may be challenging as he has been unable to use BiPAP, may be having difficulty with medications. TOC for in-depth review and for provision of resources as able  Schizo affective schizophrenia ([place])- (present on admission) -- Patient not yet established with psychiatry in the area.   Continue Resporal and duloxetine as recommended by inpatient psychiatry consultation.  Outpatient establishment with psychiatric care recommended.  Obesity hypoventilation syndrome (HCC) -- Suspected diagnosis based on body habitus.  Weight loss as an outpatient recommended.  Hypertension associated with diabetes ([place])- (present on admission) -- Appears stable, continue amlodipine.  Cigarette smoker- (present on admission) -- Recommend cessation     Subjective:  Feels better but breathing still not back to baseline  Objective Vital signs were reviewed and unremarkable. Physical Exam Vitals reviewed.  Constitutional:      General: He is not in acute distress.    Appearance: He is not ill-appearing or toxic-appearing.  Cardiovascular:     Rate and Rhythm: Normal rate and regular rhythm.     Heart sounds: No murmur heard. Pulmonary:     Effort: Pulmonary effort is normal. No respiratory distress.     Breath sounds: No wheezing, rhonchi or rales.  Musculoskeletal:     Right lower leg: No edema.     Left lower leg: Edema present.  Neurological:     Mental Status: He is alert.  Psychiatric:        Mood and Affect: Mood normal.        Behavior: Behavior normal.     Data Reviewed:  No new results  Family Communication: none  Disposition: Status is: Inpatient  Remains inpatient appropriate because: COPD exacerbation         Time spent: 25 minutes  Author: [name], MD 01/17/2021 11:39 AM  For on call review [email or web address].

## 2021-01-18 ENCOUNTER — Ambulatory Visit: Payer: Medicaid Other | Admitting: Critical Care Medicine

## 2021-01-18 LAB — GLUCOSE, CAPILLARY
Glucose-Capillary: 108 mg/dL — ABNORMAL HIGH (ref 70–99)
Glucose-Capillary: 166 mg/dL — ABNORMAL HIGH (ref 70–99)
Glucose-Capillary: 135 mg/dL — ABNORMAL HIGH (ref 70–99)
Glucose-Capillary: 128 mg/dL — ABNORMAL HIGH (ref 70–99)

## 2021-01-18 LAB — BASIC METABOLIC PANEL
Anion gap: 10 (ref 5–15)
BUN: 28 mg/dL — ABNORMAL HIGH (ref 6–20)
CO2: 32 mmol/L (ref 22–32)
Calcium: 8.6 mg/dL — ABNORMAL LOW (ref 8.9–10.3)
Chloride: 97 mmol/L — ABNORMAL LOW (ref 98–111)
Creatinine, Ser: 0.94 mg/dL (ref 0.61–1.24)
GFR, Estimated: >60 mL/min (ref >60)
Glucose, Bld: 126 mg/dL — ABNORMAL HIGH (ref 70–99)
Potassium: 3.7 mmol/L (ref 3.5–5.1)
Sodium: 139 mmol/L (ref 135–145)

## 2021-01-18 MED ORDER — PREDNISONE 20 MG PO TABS
40.0000 mg | ORAL_TABLET | Freq: Every day | ORAL | Status: DC
  Administered 2021-01-19: 40 mg via ORAL
  Filled 2021-01-18: qty 2

## 2021-01-18 NOTE — TOC Progression Note (Signed)
Transition of Care [facility](TOC) - Progression Note    Patient Details  Name: Cindra EvesFreddie  MRN: [id number] Date of Birth: 06/25/1970  Transition of Care [facility](TOC) CM/SW Contact  Geni BersMcGibboney, Karandeep Resende, RN Phone Number: 01/18/2021, 2:00 PM  Clinical Narrative:     [facility]   Expected Discharge Plan: Homeless Shelter Barriers to Discharge: No Barriers Identified  Expected Discharge Plan and Services Expected Discharge Plan: Homeless Shelter       Living arrangements for the past 2 months: Homeless Shelter                                       Social Determinants of Health (SDOH) Interventions    Readmission Risk Interventions No flowsheet data found.

## 2021-01-18 NOTE — Progress Notes (Signed)
°  Progress Note   Patient: Raymond Hood [id number] DOB: Aug 17, 1970 DOA: 01/13/2021     4 DOS: the patient was seen and examined on 01/18/2021   Brief hospital course: 51 year old male PMH COPD, OSA, on BiPAP, noncompliant secondary to homeless status, schizoaffective disorder bipolar type, presented with shortness of breath and hypoxia.  Admitted for acute hypoxic respiratory failure secondary to COPD exacerbation complicated by OSA and probable OHS.  Slowly improving with standard therapy. -- 1/9 slowly improving, not quite back to respiratory baseline but anticipate discharge tomorrow   Assessment and Plan * Acute respiratory failure with hypoxia and hypercapnia (HCC)-resolved as of 01/18/2021, (present on admission) -- Resolved.  Continue to treat COPD exacerbation.  BiPAP at night.  COPD with acute exacerbation ([place])- (present on admission) -- Slowly improving.  Change to oral steroids, continue bronchodilators, antibiotic.  Continue BiPAP at night.  Acute on chronic diastolic CHF (congestive heart failure) ([place]) -- Echocardiogram October 2020 again showed normal LVEF, grade 1 diastolic dysfunction.  Significant volume overload now appears resolved.  OSA (obstructive sleep apnea)- (present on admission) -- BiPAP nightly.  Type 2 diabetes mellitus with hyperglycemia (HCC) -- Hyperglycemic complicated by steroids, overall stable.  Apparently only on metformin as an outpatient.  Expect blood sugars to continue to improve as steroids are decreased. -- Continue sliding scale insulin.    Homeless -- Appreciate TOC, likely to return to [facility] where he has been living for several months despite the inability to use his BiPAP nightly  Schizo affective schizophrenia ([place])- (present on admission) -- Patient not yet established with psychiatry in the area.  Continue Resporal and duloxetine as recommended by inpatient psychiatry consultation.  Outpatient establishment with psychiatric  care recommended.  Obesity hypoventilation syndrome (HCC) -- Suspected diagnosis based on body habitus.  Weight loss as an outpatient recommended.  Hypertension associated with diabetes ([place])- (present on admission) -- Appears stable, continue amlodipine.  Cigarette smoker- (present on admission) -- Recommend cessation     Subjective:  Feels better but still not back to baseline  Objective Vital signs were reviewed and unremarkable. Physical Exam Vitals reviewed.  Constitutional:      General: He is not in acute distress.    Appearance: He is not ill-appearing or toxic-appearing.  Cardiovascular:     Rate and Rhythm: Normal rate and regular rhythm.     Heart sounds: No murmur heard. Pulmonary:     Effort: Pulmonary effort is normal. No respiratory distress.     Breath sounds: No wheezing, rhonchi or rales.  Psychiatric:        Mood and Affect: Mood normal.        Behavior: Behavior normal.     Data Reviewed:  BMP noted  Family Communication:   Disposition: Status is: Inpatient  Remains inpatient appropriate because: COPD exacerbation         Time spent: 20 minutes  Author: [name], MD 01/18/2021 5:20 PM  For on call review [email or web address].

## 2021-01-18 NOTE — Plan of Care (Signed)
°  Problem: Education: °Goal: Knowledge of disease or condition will improve °Outcome: Progressing °Goal: Knowledge of the prescribed therapeutic regimen will improve °Outcome: Progressing °Goal: Individualized Educational Video(s) °Outcome: Progressing °  °

## 2021-01-19 ENCOUNTER — Other Ambulatory Visit (HOSPITAL_COMMUNITY): Payer: Self-pay

## 2021-01-19 ENCOUNTER — Telehealth: Payer: Self-pay | Admitting: *Deleted

## 2021-01-19 LAB — GLUCOSE, CAPILLARY
Glucose-Capillary: 109 mg/dL — ABNORMAL HIGH (ref 70–99)
Glucose-Capillary: 186 mg/dL — ABNORMAL HIGH (ref 70–99)
Glucose-Capillary: 197 mg/dL — ABNORMAL HIGH (ref 70–99)

## 2021-01-19 MED ORDER — PREDNISONE 10 MG PO TABS: ORAL_TABLET | ORAL | 0 refills | Status: DC

## 2021-01-19 MED ORDER — ALBUTEROL SULFATE HFA 108 (90 BASE) MCG/ACT IN AERS
2.0000 | INHALATION_SPRAY | Freq: Four times a day (QID) | RESPIRATORY_TRACT | 2 refills | Status: DC | PRN
  Filled 2021-01-19: qty 8.5, 25d supply, fill #0

## 2021-01-19 MED ORDER — PREDNISONE 10 MG PO TABS
ORAL_TABLET | ORAL | 0 refills | Status: DC
  Filled 2021-01-19: qty 21, 9d supply, fill #0

## 2021-01-19 MED ORDER — ALBUTEROL SULFATE HFA 108 (90 BASE) MCG/ACT IN AERS: 2.0000 | INHALATION_SPRAY | Freq: Four times a day (QID) | RESPIRATORY_TRACT | 2 refills | Status: DC | PRN

## 2021-01-19 MED ORDER — ALBUTEROL SULFATE HFA 108 (90 BASE) MCG/ACT IN AERS
2.0000 | INHALATION_SPRAY | Freq: Four times a day (QID) | RESPIRATORY_TRACT | 2 refills | Status: DC | PRN
  Filled 2021-01-19: qty 6.7, 25d supply, fill #0

## 2021-01-19 NOTE — Progress Notes (Signed)
Pt to be discharged to [facility] today. [name] discharge instructions including Prednisone Medication that was filled and brought to the floor with Pt at time of discharge. Pt was offered a wash up by the NT earlier in the shift and Pt refused. Pt while getting ready for discharge again offered to have a wash up Pt again refused.

## 2021-01-19 NOTE — TOC Progression Note (Signed)
Transition of Care [facility](TOC) - Progression Note    Patient Details  Name: Raymond Hood  MRN: [id number] Date of Birth: 12/31/1970  Transition of Care [facility](TOC) CM/SW Contact  Geni BersMcGibboney, Daphney Hopke, RN Phone Number: 01/19/2021, 2:16 PM  Clinical Narrative:    A call to [facility] was made. Dr. Delford FieldWright come to visit every Wednesday and fills medications. [facility] needing CPAP machine related to no plugs. Also can not take him if he is on Oxygen.    Expected Discharge Plan: Homeless Shelter Barriers to Discharge: No Barriers Identified  Expected Discharge Plan and Services Expected Discharge Plan: Homeless Shelter       Living arrangements for the past 2 months: Homeless Shelter Expected Discharge Date: 01/19/21                                     Social Determinants of Health (SDOH) Interventions    Readmission Risk Interventions No flowsheet data found.

## 2021-01-19 NOTE — Progress Notes (Signed)
Pt ambulated in the hallway. On room air oxygen saturations 89-93%. Pt tolerated activity well

## 2021-01-19 NOTE — Telephone Encounter (Signed)
Copied from CRM [id number]. Topic: General - Other >> Jan 19, 2021  1:44 PM Wyonia Hough E wrote: Reason for CRM: Case Manager at [facility] called to let Dr. Delford Field know the pt was in the hospital for COPD and shortness of breath and will be discharged today/ she also asked if [facility] / she wanted to make sure he sees Dr. Delford Field / please advise

## 2021-01-19 NOTE — Telephone Encounter (Signed)
Yes [facility] will [facility] and also he needs Carilyn Goodpasture a post hosp visit with me next week here in clinic

## 2021-01-19 NOTE — TOC Progression Note (Addendum)
Transition of Care [facility](TOC) - Progression Note    Patient Details  Name: Raymond Hood  MRN: [id number] Date of Birth: 11/22/1970  Transition of Care [facility](TOC) CM/SW Contact  Geni BersMcGibboney, Navada Osterhout, RN Phone Number: 01/19/2021, 3:10 PM  Clinical Narrative:    [facility]honda[facility]. Her questions are if the pt will need O2, if pt could go to an ALF, pt is incont urine, pt only has one set of clothing, he will not bath himself, will he have transportation back, was Emergency Medicaid application completed? Explained that this is Acute hospital and that we are doing what we can for pt. However, we do not place pt's in ALF, SNF will not take pt related no skilled needs, homeless and self pay. When asked [facility] to [facility] the answer was yes, but want to be insured that Medicaid application was done. Asked for any new medications to be sent with him. To use TOC pharm for medication. Chaplain was called for clothing. Med ordered from [facility]. Taxi Voucher given to [employer].    Expected Discharge Plan: Homeless Shelter Barriers to Discharge: No Barriers Identified  Expected Discharge Plan and Services Expected Discharge Plan: Homeless Shelter       Living arrangements for the past 2 months: Homeless Shelter Expected Discharge Date: 01/19/21                                     Social Determinants of Health (SDOH) Interventions    Readmission Risk Interventions No flowsheet data found.

## 2021-01-19 NOTE — Discharge Summary (Addendum)
Physician Discharge Summary   Patient: Raymond Hood MRN: [id number] DOB: 06-Jul-1970  Admit date:     01/13/2021  Discharge date: 01/19/21  Discharge Physician: [name]   PCP: [name], MD   Recommendations at discharge:   Follow-up COPD Follow-up schizoaffective disorder Diastolic CHF, see below  Discharge Diagnoses Active Problems:   COPD with acute exacerbation ([place])   Schizo affective schizophrenia ([place])   Homeless   Type 2 diabetes mellitus with hyperglycemia (HCC)   OSA (obstructive sleep apnea)   Acute on chronic diastolic CHF (congestive heart failure) ([place])   Hypertension associated with diabetes ([place])   Morbid obesity with BMI of 50.0-59.9, adult (HCC)   Obesity hypoventilation syndrome (HCC)   Cigarette smoker  Principal Problem (Resolved):   Acute respiratory failure with hypoxia and hypercapnia [facility])   Hospital Course   51 year old male PMH COPD, OSA, on BiPAP, noncompliant secondary to homeless status, schizoaffective disorder bipolar type, presented with shortness of breath and hypoxia.  Admitted for acute hypoxic respiratory failure secondary to COPD exacerbation complicated by OSA and probable OHS.  Slowly improving with standard therapy, hypoxia resolved, stable for discharge.   * Acute respiratory failure with hypoxia and hypercapnia (HCC)-resolved as of 01/18/2021, (present on admission) -- Resolved.  COPD exacerbation resolved.  COPD with acute exacerbation ([place])- (present on admission) -- Resolved with standard treatment.  Discharged on oral steroids. Continue BiPAP at night as able although the patient reports that he has not been able to use it at [facility] for many months now.  Acute on chronic diastolic CHF (congestive heart failure) ([place]) -- Echocardiogram October 2020 again showed normal LVEF, grade 1 diastolic dysfunction.  Significant volume overload now appears resolved.  Outpatient follow-up with PCP.  Consider diuretic in  future if needed.  OSA (obstructive sleep apnea)- (present on admission) -- BiPAP nightly ideally  Type 2 diabetes mellitus with hyperglycemia ([place]) -- Hyperglycemic complicated by steroids, overall stable.  Apparently only on metformin as an outpatient.  Expect blood sugars to continue to improve as steroids are decreased. -- Resume metformin on discharge  Homeless -- Appreciate TOC, likely to return to [facility] where he has been living for several months despite the inability to use his BiPAP nightly.     Schizo affective schizophrenia ([place])- (present on admission) -- Patient not yet established with psychiatry in the area.  Continue Resporal and duloxetine as recommended by inpatient psychiatry consultation.  Outpatient establishment with psychiatric care recommended.  Obesity hypoventilation syndrome (HCC) -- Suspected diagnosis based on body habitus.  Weight loss as an outpatient recommended.  Hypertension associated with diabetes ([place])- (present on admission) -- Appears stable, continue amlodipine.  Cigarette smoker- (present on admission) -- Recommend cessation        Consultants: none Procedures performed: none  Disposition: Home Diet recommendation: diabetic diet  DISCHARGE MEDICATION: Allergies as of 01/19/2021   No Known Allergies      Medication List     TAKE these medications    albuterol 108 (90 Base) MCG/ACT inhaler Commonly known as: VENTOLIN HFA Inhale 2 puffs into the lungs every 6  hours as needed for wheezing or shortness of breath.   amLODipine 5 MG tablet Commonly known as: NORVASC Take 1 tablet (5 mg total) by mouth once daily. Notes to patient: Last Dose of this Medication was given on January 19, 2021 at 9:36 am   DULoxetine 60 MG capsule Commonly known as: CYMBALTA Take 1 capsule (60 mg total) by mouth once  daily. Notes to patient: Last Dose of this Medication was given on January 19, 2021 at 9:36 am   famotidine 20 MG  tablet Commonly known as: PEPCID Take 1 tablet (20 mg total) by mouth 2 (two) times daily. What changed: when to take this Notes to patient: Last Dose of this Medication was given on January 19, 2021 at 9:36 am   fluticasone 50 MCG/ACT nasal spray Commonly known as: FLONASE Place 2 sprays into both nostrils daily.   hydrOXYzine 25 MG tablet Commonly known as: ATARAX Take 1 tablet (25 mg total) by mouth 3 (three) times daily as needed. What changed: reasons to take this   metFORMIN 500 MG tablet Commonly known as: GLUCOPHAGE Take 1 tablet (500 mg total) by mouth 2 (two) times daily with a meal.   predniSONE 10 MG tablet Commonly known as: DELTASONE Take 4 tabs by mouth daily with breakfast for 3 days,  2 tabs daily with breakfast for 3 days, THEN 1 tablet  daily with breakfast for 3 days. Start taking on: January 20, 2021   risperidone 4 MG tablet Commonly known as: RISPERDAL Take 1 tablet (4 mg total) by mouth once daily. Notes to patient: Last Dose of this Medication was given on January 19, 2021 at 9:36 am   Symbicort 80-4.5 MCG/ACT inhaler Generic drug: budesonide-formoterol Inhale 2 puffs into the lungs 2 (two) times daily.        Rule Follow up.   Specialty: Behavioral Health Why: Please go here for walk in access from 9am-1pm. THey manage medications and therapy. Contact information: [place]        [name], MD. Schedule an appointment as soon as possible for a visit in 1 week(s).   Specialty: Pulmonary Disease Contact information: [address] [address] [phone]                Feels fine, breathing fine  Discharge Exam: Filed Weights   01/14/21 0427 01/16/21 0533 01/17/21 0643  Weight: (!) 171.3 kg (!) 169.8 kg (!) 164.6 kg   Physical Exam Vitals reviewed.  Constitutional:      General: He is not  in acute distress.    Appearance: He is not ill-appearing or toxic-appearing.  Cardiovascular:     Rate and Rhythm: Normal rate and regular rhythm.     Heart sounds: No murmur heard. Pulmonary:     Effort: Pulmonary effort is normal. No respiratory distress.     Breath sounds: No wheezing, rhonchi or rales.  Psychiatric:        Mood and Affect: Mood normal.        Behavior: Behavior normal.   Condition at discharge: good  The results of significant diagnostics from this hospitalization (including imaging, microbiology, ancillary and laboratory) are listed below for reference.   Imaging Studies: DG Chest Port 1 View  Result Date: 01/13/2021 CLINICAL DATA:  Dyspnea. EXAM: PORTABLE CHEST 1 VIEW COMPARISON:  11/16/2020 FINDINGS: Cardiac enlargement with mild vascular congestion. Probable mild perihilar edema. No pleural effusion. Mild bibasilar atelectasis. IMPRESSION: Mild vascular congestion and perihilar edema compatible with congestive heart failure. Electronically Signed   By: Franchot Gallo M.D.   On: 01/13/2021 17:15    Microbiology: Results for orders placed or performed during the hospital encounter of 01/13/21  Resp Panel by RT-PCR (Flu A&B, Covid) Nasopharyngeal Swab     Status: None  Collection Time: 01/13/21  5:02 PM   Specimen: Nasopharyngeal Swab; Nasopharyngeal(NP) swabs in vial transport medium  Result Value Ref Range Status   SARS Coronavirus 2 by RT PCR NEGATIVE NEGATIVE Final    Comment: (NOTE) SARS-CoV-2 target nucleic acids are NOT DETECTED.  The SARS-CoV-2 RNA is generally detectable in upper respiratory specimens during the acute phase of infection. The lowest concentration of SARS-CoV-2 viral copies this assay can detect is 138 copies/mL. A negative result does not preclude SARS-Cov-2 infection and should not be used as the sole basis for treatment or other patient management decisions. A negative result may occur with  improper specimen collection/handling,  submission of specimen other than nasopharyngeal swab, presence of viral mutation(s) within the areas targeted by this assay, and inadequate number of viral copies(<138 copies/mL). A negative result must be combined with clinical observations, patient history, and epidemiological information. The expected result is Negative.  Fact Sheet for Patients:  EntrepreneurPulse.com.au  Fact Sheet for Healthcare Providers:  IncredibleEmployment.be  This test is no t yet approved or cleared by the [place] FDA and  has been authorized for detection and/or diagnosis of SARS-CoV-2 by FDA under an Emergency Use Authorization (EUA). This EUA will remain  in effect (meaning this test can be used) for the duration of the COVID-19 declaration under Section 564(b)(1) of the Act, 21 U.S.C.section 360bbb-3(b)(1), unless the authorization is terminated  or revoked sooner.       Influenza A by PCR NEGATIVE NEGATIVE Final   Influenza B by PCR NEGATIVE NEGATIVE Final    Comment: (NOTE) The Xpert Xpress SARS-CoV-2/FLU/RSV plus assay is intended as an aid in the diagnosis of influenza from Nasopharyngeal swab specimens and should not be used as a sole basis for treatment. Nasal washings and aspirates are unacceptable for Xpert Xpress SARS-CoV-2/FLU/RSV testing.  Fact Sheet for Patients: EntrepreneurPulse.com.au  Fact Sheet for Healthcare Providers: IncredibleEmployment.be  This test is not yet approved or cleared by the [place] FDA and has been authorized for detection and/or diagnosis of SARS-CoV-2 by FDA under an Emergency Use Authorization (EUA). This EUA will remain in effect (meaning this test can be used) for the duration of the COVID-19 declaration under Section 564(b)(1) of the Act, 21 U.S.C. section 360bbb-3(b)(1), unless the authorization is terminated or revoked.  Performed at [facility], [place] [address] [place], [place] [address]   MRSA Next Gen by PCR, Nasal     Status: None   Collection Time: 01/13/21  8:36 PM   Specimen: Nasal Mucosa; Nasal Swab  Result Value Ref Range Status   MRSA by PCR Next Gen NOT DETECTED NOT DETECTED Final    Comment: (NOTE) The GeneXpert MRSA Assay (FDA approved for NASAL specimens only), is one component of a comprehensive MRSA colonization surveillance program. It is not intended to diagnose MRSA infection nor to guide or monitor treatment for MRSA infections. Test performance is not FDA approved in patients less than 66 years old. Performed at [facility], [place] [address] [place], [place] [address]     Labs: CBC: Recent Labs  Lab 01/13/21 1656 01/14/21 0246  WBC 13.6* 11.5*  HGB 15.1 15.0  HCT 49.4 50.3  MCV 95.6 95.8  PLT 198 [id number]   Basic Metabolic Panel: Recent Labs  Lab 01/14/21 0246 01/15/21 0425 01/16/21 0436 01/17/21 1224 01/18/21 0409  NA 140 137 139 137 139  K 4.7 4.3 4.0 4.4 3.7  CL 100 94* 95* 93* 97*  CO2 32  33* 36* 33* 32  GLUCOSE 222* 205* 129* 202* 126*  BUN 17 26* 28* 27* 28*  CREATININE 0.75 0.94 1.06 0.85 0.94  CALCIUM 7.8* 8.3* 8.2* 8.3* 8.6*   Liver Function Tests: No results for input(s): AST, ALT, ALKPHOS, BILITOT, PROT, ALBUMIN in the last 168 hours. CBG: Recent Labs  Lab 01/18/21 1659 01/18/21 2124 01/19/21 0745 01/19/21 1124 01/19/21 1631  GLUCAP 135* 128* 109* 186* 197*    Discharge time spent: greater than 30 minutes.  Signed: [name], MD [facility] 01/19/2021

## 2021-01-20 ENCOUNTER — Telehealth: Payer: Self-pay

## 2021-01-20 ENCOUNTER — Telehealth: Payer: Self-pay | Admitting: Critical Care Medicine

## 2021-01-20 ENCOUNTER — Encounter: Payer: Self-pay | Admitting: Physician Assistant

## 2021-01-20 ENCOUNTER — Other Ambulatory Visit: Payer: Self-pay | Admitting: Critical Care Medicine

## 2021-01-20 ENCOUNTER — Other Ambulatory Visit (HOSPITAL_COMMUNITY): Payer: Self-pay

## 2021-01-20 MED ORDER — DULOXETINE HCL 60 MG PO CPEP: 60.0000 mg | ORAL_CAPSULE | Freq: Every day | ORAL | 0 refills | Status: DC

## 2021-01-20 MED ORDER — AMLODIPINE BESYLATE 5 MG PO TABS: 5.0000 mg | ORAL_TABLET | Freq: Every day | ORAL | 0 refills | Status: DC

## 2021-01-20 MED ORDER — RISPERIDONE 4 MG PO TABS: 4.0000 mg | ORAL_TABLET | Freq: Every day | ORAL | 0 refills | Status: DC

## 2021-01-20 MED ORDER — ALBUTEROL SULFATE HFA 108 (90 BASE) MCG/ACT IN AERS
2.0000 | INHALATION_SPRAY | Freq: Four times a day (QID) | RESPIRATORY_TRACT | 2 refills | Status: DC | PRN
Start: 2021-01-20 — End: 2021-08-09
  Filled 2021-01-20 – 2021-01-21 (×2): qty 8.5, 25d supply, fill #0

## 2021-01-20 MED ORDER — AEROCHAMBER PLUS FLO-VU MISC
0 refills | Status: DC
  Filled 2021-01-20 – 2021-01-21 (×2): qty 1, 1d supply, fill #0

## 2021-01-20 MED ORDER — FAMOTIDINE 20 MG PO TABS
20.0000 mg | ORAL_TABLET | Freq: Two times a day (BID) | ORAL | 0 refills | Status: DC
Start: 2021-01-20 — End: 2021-01-25

## 2021-01-20 MED ORDER — METFORMIN HCL 500 MG PO TABS: 500.0000 mg | ORAL_TABLET | Freq: Two times a day (BID) | ORAL | 0 refills | Status: DC

## 2021-01-20 NOTE — Telephone Encounter (Signed)
Transition Care Management Follow-up Telephone Call Date of discharge and from where: [facility]19[facility]2021, [facility]  Patient was seen by Dr [facility]Wright [facility].  Next appointment with Dr Delford FieldWright - 01/25/2021 @ [facility]

## 2021-01-20 NOTE — Telephone Encounter (Signed)
Appt set for 01/25/21 at 10:00

## 2021-01-20 NOTE — Telephone Encounter (Signed)
-----   Message from Storm Frisk, MD sent at 01/20/2021  2:55 PM EST ----- Pt needs post hosp OV with me next week face to face pls   let me know date /time and I will tell the pt

## 2021-01-20 NOTE — Progress Notes (Addendum)
Pt seen by Dr Delford FieldWright  BiP[place] a [place] house [place]. He will see if he can get the machine here from [place].   His breathing is a little better. Hie eyes feel better. He is not as congested.   He left the hospital with the prednisone, took 4 tabs this am.  He missed the PW appt because he was in the hospital >> needs another.  He needs the pills put in the pill organizer.   He has Symbicort from before, [facility].  Was supposed to get albuterol, but did not.  Staff will ck on receptacle near his bed to assist w/ him using BiPAP.  O2 sats 96%, HR 97, 118/73  CBG 218  He  is still wheezing, but is much improved and Sats are much better.   He was instructed on proper inhaler use.   PW will get him alb MDI and a spacer.  All oral meds renewed.   His friend was contacted, she will bring the BiPAP machine tonight.   Theodore Demarkhonda Leighton Brickley, PA-C 01/20/2021 3:01 PM

## 2021-01-20 NOTE — Telephone Encounter (Signed)
Cal from [employer] with contact # for patient, is friend Doug Sou. [phone]

## 2021-01-20 NOTE — Telephone Encounter (Signed)
Unable to reach patient.  Left message with Juliette Alcide with TOC, 4W at [facility] to see if she has a way to contact patient to schedule a post op apt with Dr. Delford Field.   _______________________________ Camie Patience an updated number from Doug Sou.  Documented in the chart and LVM to return call. Patient did not answer

## 2021-01-20 NOTE — Telephone Encounter (Signed)
Noted.  Spoke to [name] and got information prior to this message.

## 2021-01-21 ENCOUNTER — Other Ambulatory Visit (HOSPITAL_COMMUNITY): Payer: Self-pay

## 2021-01-24 NOTE — Progress Notes (Deleted)
New Patient Office Visit  Subjective:  Patient ID: Raymond Hood, male    DOB: [birth year]  Age: 51 y.o. MRN: [id number]  CC: No chief complaint on file.   HPI Raymond Hood presents for  Seen for post hosp f/u  Urine alb a1c flu hcv pcv eye colon  PCP: [name], MD    Recommendations at discharge:    Follow-up COPD Follow-up schizoaffective disorder Diastolic CHF, see below   Discharge Diagnoses Active Problems:   COPD with acute exacerbation ([place])   Schizo affective schizophrenia ([place])   Homeless   Type 2 diabetes mellitus with hyperglycemia (HCC)   OSA (obstructive sleep apnea)   Acute on chronic diastolic CHF (congestive heart failure) ([place])   Hypertension associated with diabetes ([place])   Morbid obesity with BMI of 50.0-59.9, adult (HCC)   Obesity hypoventilation syndrome (HCC)   Cigarette smoker   Principal Problem (Resolved):   Acute respiratory failure with hypoxia and hypercapnia [facility])     Hospital Course   51 year old male PMH COPD, OSA, on BiPAP, noncompliant secondary to homeless status, schizoaffective disorder bipolar type, presented with shortness of breath and hypoxia.  Admitted for acute hypoxic respiratory failure secondary to COPD exacerbation complicated by OSA and probable OHS.  Slowly improving with standard therapy, hypoxia resolved, stable for discharge.     * Acute respiratory failure with hypoxia and hypercapnia (HCC)-resolved as of 01/18/2021, (present on admission) -- Resolved.  COPD exacerbation resolved.   COPD with acute exacerbation ([place])- (present on admission) -- Resolved with standard treatment.  Discharged on oral steroids. Continue BiPAP at night as able although the patient reports that he has not been able to use it at [facility] for many months now.   Acute on chronic diastolic CHF (congestive heart failure) ([place]) -- Echocardiogram October 2020 again showed normal LVEF, grade 1 diastolic dysfunction.  Significant  volume overload now appears resolved.  Outpatient follow-up with PCP.  Consider diuretic in future if needed.   OSA (obstructive sleep apnea)- (present on admission) -- BiPAP nightly ideally   Type 2 diabetes mellitus with hyperglycemia ([place]) -- Hyperglycemic complicated by steroids, overall stable.  Apparently only on metformin as an outpatient.  Expect blood sugars to continue to improve as steroids are decreased. -- Resume metformin on discharge   Homeless -- Appreciate TOC, likely to return to [facility] where he has been living for several months despite the inability to use his BiPAP nightly.      Schizo affective schizophrenia ([place])- (present on admission) -- Patient not yet established with psychiatry in the area.  Continue Resporal and duloxetine as recommended by inpatient psychiatry consultation.  Outpatient establishment with psychiatric care recommended.   Obesity hypoventilation syndrome (HCC) -- Suspected diagnosis based on body habitus.  Weight loss as an outpatient recommended.   Hypertension associated with diabetes ([place])- (present on admission) -- Appears stable, continue amlodipine.   Cigarette smoker- (present on admission) -- Recommend cessation  Past Medical History:  Diagnosis Date   Bipolar 1 disorder, depressed ([place])    COPD (chronic obstructive pulmonary disease) ([place])    Depression with anxiety    Diabetes mellitus type II, non insulin dependent ([place]) 11/2020   A1c 7.3   Gastric ulcer    when on NSAIDs   GERD (gastroesophageal reflux disease)    HTN (hypertension)    OSA (obstructive sleep apnea)    not currently on CPAP   Rheumatoid arthritis ([place])    Right kidney mass, 2.8 cm  11/2020   Tobacco abuse     Past Surgical History:  Procedure Laterality Date   left index finger surgery Left     Family History  Problem Relation Age of Onset   Schizophrenia Mother    Diabetes Mellitus II Father     Social History    Socioeconomic History   Marital status: Single    Spouse name: Not on file   Number of children: Not on file   Years of education: Not on file   Highest education level: Not on file  Occupational History   Not on file  Tobacco Use   Smoking status: Every Day    Packs/day: 1.00    Types: Cigarettes   Smokeless tobacco: Never  Vaping Use   Vaping Use: Never used  Substance and Sexual Activity   Alcohol use: Not Currently   Drug use: Never   Sexual activity: Not on file  Other Topics Concern   Not on file  Social History Narrative   Not on file   Social Determinants of Health   Financial Resource Strain: Not on file  Food Insecurity: Not on file  Transportation Needs: Not on file  Physical Activity: Not on file  Stress: Not on file  Social Connections: Not on file  Intimate Partner Violence: Not on file    ROS Review of Systems  Objective:   Today's Vitals: There were no vitals taken for this visit.  Physical Exam  Assessment & Plan:   Problem List Items Addressed This Visit   None   Outpatient Encounter Medications as of 01/25/2021  Medication Sig   albuterol (VENTOLIN HFA) 108 (90 Base) MCG/ACT inhaler Inhale 2 puffs into the lungs every 6  hours as needed for wheezing or shortness of breath.   amLODipine (NORVASC) 5 MG tablet Take 1 tablet (5 mg total) by mouth once daily.   amLODipine (NORVASC) 5 MG tablet Take 1 tablet (5 mg total) by mouth once daily.   budesonide-formoterol (SYMBICORT) 80-4.5 MCG/ACT inhaler Inhale 2 puffs into the lungs 2 (two) times daily.   DULoxetine (CYMBALTA) 60 MG capsule Take 1 capsule (60 mg total) by mouth once daily.   DULoxetine (CYMBALTA) 60 MG capsule Take 1 capsule (60 mg total) by mouth once daily.   famotidine (PEPCID) 20 MG tablet Take 1 tablet (20 mg total) by mouth 2 (two) times daily. (Patient taking differently: Take 20 mg by mouth daily.)   famotidine (PEPCID) 20 MG tablet Take 1 tablet (20  mg total) by mouth 2 (two) times daily.   fluticasone (FLONASE) 50 MCG/ACT nasal spray Place 2 sprays into both nostrils daily.   hydrOXYzine (ATARAX) 25 MG tablet Take 1 tablet (25 mg total) by mouth 3 (three) times daily as needed. (Patient taking differently: Take 25 mg by mouth 3 (three) times daily as needed for anxiety.)   metFORMIN (GLUCOPHAGE) 500 MG tablet Take 1 tablet (500 mg total) by mouth 2 (two) times daily with a meal.   metFORMIN (GLUCOPHAGE) 500 MG tablet Take 1 tablet (500 mg total) by mouth 2 (two) times daily with a meal.   predniSONE (DELTASONE) 10 MG tablet Take 4 tabs by mouth daily with breakfast for 3 days,  2 tabs daily with breakfast for 3 days, THEN 1 tablet  daily with breakfast for 3 days.   risperidone (RISPERDAL) 4 MG tablet Take 1 tablet (4 mg total) by mouth once daily.   risperidone (RISPERDAL) 4 MG tablet Take 1 tablet (4 mg total)  by mouth once daily.   Spacer/Aero-Holding Chambers (AEROCHAMBER PLUS WITH MASK) inhaler Use as directed   No facility-administered encounter medications on file as of 01/25/2021.    Follow-up: No follow-ups on file.   Asencion Noble, MD

## 2021-01-25 ENCOUNTER — Other Ambulatory Visit (HOSPITAL_COMMUNITY): Payer: Self-pay

## 2021-01-25 ENCOUNTER — Encounter: Payer: Self-pay | Admitting: Critical Care Medicine

## 2021-01-25 ENCOUNTER — Other Ambulatory Visit: Payer: Self-pay

## 2021-01-25 ENCOUNTER — Ambulatory Visit: Payer: Self-pay | Attending: Critical Care Medicine | Admitting: Critical Care Medicine

## 2021-01-25 VITALS — BP 110/80 | HR 105 | Resp 16 | Wt 370.8 lb

## 2021-01-25 DIAGNOSIS — J441 Chronic obstructive pulmonary disease with (acute) exacerbation: Secondary | ICD-10-CM

## 2021-01-25 DIAGNOSIS — E1159 Type 2 diabetes mellitus with other circulatory complications: Secondary | ICD-10-CM

## 2021-01-25 DIAGNOSIS — E278 Other specified disorders of adrenal gland: Secondary | ICD-10-CM

## 2021-01-25 DIAGNOSIS — I152 Hypertension secondary to endocrine disorders: Secondary | ICD-10-CM

## 2021-01-25 DIAGNOSIS — F259 Schizoaffective disorder, unspecified: Secondary | ICD-10-CM

## 2021-01-25 DIAGNOSIS — Z1159 Encounter for screening for other viral diseases: Secondary | ICD-10-CM

## 2021-01-25 DIAGNOSIS — F17210 Nicotine dependence, cigarettes, uncomplicated: Secondary | ICD-10-CM

## 2021-01-25 DIAGNOSIS — E1165 Type 2 diabetes mellitus with hyperglycemia: Secondary | ICD-10-CM

## 2021-01-25 DIAGNOSIS — Z139 Encounter for screening, unspecified: Secondary | ICD-10-CM

## 2021-01-25 DIAGNOSIS — Z720 Tobacco use: Secondary | ICD-10-CM

## 2021-01-25 LAB — POCT GLYCOSYLATED HEMOGLOBIN (HGB A1C): HbA1c, POC (controlled diabetic range): 7.7 % — AB (ref 0.0–7.0)

## 2021-01-25 LAB — GLUCOSE, POCT (MANUAL RESULT ENTRY): POC Glucose: 199 mg/dl — AB (ref 70–99)

## 2021-01-25 MED ORDER — ATORVASTATIN CALCIUM 10 MG PO TABS
10.0000 mg | ORAL_TABLET | Freq: Every day | ORAL | 3 refills | Status: DC
  Filled 2021-01-25: qty 30, 30d supply, fill #0

## 2021-01-25 MED ORDER — DAPAGLIFLOZIN PROPANEDIOL 10 MG PO TABS
10.0000 mg | ORAL_TABLET | Freq: Every day | ORAL | 0 refills | Status: DC
  Filled 2021-01-25: qty 30, 30d supply, fill #0

## 2021-01-25 MED ORDER — METFORMIN HCL 500 MG PO TABS
1000.0000 mg | ORAL_TABLET | Freq: Two times a day (BID) | ORAL | 0 refills | Status: DC
Start: 2021-01-25 — End: 2021-03-04
  Filled 2021-01-25: qty 120, 30d supply, fill #0

## 2021-01-25 MED ORDER — BUDESONIDE-FORMOTEROL FUMARATE 80-4.5 MCG/ACT IN AERO
2.0000 | INHALATION_SPRAY | Freq: Two times a day (BID) | RESPIRATORY_TRACT | 0 refills | Status: DC
  Filled 2021-01-25: qty 10.2, 30d supply, fill #0

## 2021-01-25 NOTE — Progress Notes (Signed)
Established Patient Office Visit  Subjective:  Patient ID: Raymond Hood, male    DOB: 12-12-1970  Age: 51 y.o. MRN: [id number]  CC:  Chief Complaint  Patient presents with   Hospitalization Follow-up    HPI Transition of care visit Raymond Hood presents for follow-up after hospitalization for COPD exacerbation. He was discharged on 01/19/21. Since then, he had a transition of care follow-up call with [name], RN on 01/20/21, and was seen at the shelter by [name] on 01/20/21.   He reports that he is feeling better since then, but that he still has some trouble taking deep breaths, dyspnea on exertion, and some coughing. He denies any rhinorrhea or congestion. He reports some feelings of lightheadedness upon standing or sitting quickly after activity. His initial SpO2 reading was at 84% after walking from waiting room to exam room. After rest, it measured 96%.   The patient states that he is using all the medications he was discharged with as directed, including the oral steroid taper. He says he has not been able to use the BiPAP machine that he was given, because he does not have a bedside table and there is not room for it in his bed at the shelter. He still admits to smoking cigarettes daily.   The patient says he has been experiencing symptoms of increased urinary frequency. He denies dysuria and hematuria. He states that his sleep has been greatly disturbed because he awakes up to 5 times at night to urinate, just as he feels like he is getting good sleep. He is very fatigued as a result of this. He tries to take naps when he can, but this is not always possible at the shelter due to rules in place there. He reports that he has been experiencing greater hunger and thirst recently. Of note, a POC blood sugar was done today, that measured 199 mg/dL. His A1C at today's visit was 7.7%.  The patient also admits to foot pain, mainly in his ankles. He experiences some feelings of numbness  in his feet, especially on the soles. He states that he has had the pain and numbness for a long time, and that it does not seem to be getting better or worse.  The patient states that his mood remains depressed and his anxiety high. He experiences thoughts of hurting himself, but he has no plans to do so. He has not had a chance yet to follow up with mental health.   The following is the discharge summary from the patient's hospital course on 01/13/2021:  Admit date:     01/13/2021  Discharge date: 01/19/21  Discharge Physician: [name]    PCP: [name], MD    Recommendations at discharge:    Follow-up COPD Follow-up schizoaffective disorder Diastolic CHF, see below   Discharge Diagnoses Active Problems:   COPD with acute exacerbation ([place])   Schizo affective schizophrenia ([place])   Homeless   Type 2 diabetes mellitus with hyperglycemia (HCC)   OSA (obstructive sleep apnea)   Acute on chronic diastolic CHF (congestive heart failure) ([place])   Hypertension associated with diabetes ([place])   Morbid obesity with BMI of 50.0-59.9, adult (HCC)   Obesity hypoventilation syndrome (HCC)   Cigarette smoker   Principal Problem (Resolved):   Acute respiratory failure with hypoxia and hypercapnia [facility])     Hospital Course   51 year old male PMH COPD, OSA, on BiPAP, noncompliant secondary to homeless status, schizoaffective disorder bipolar type, presented with shortness  of breath and hypoxia.  Admitted for acute hypoxic respiratory failure secondary to COPD exacerbation complicated by OSA and probable OHS.  Slowly improving with standard therapy, hypoxia resolved, stable for discharge.     * Acute respiratory failure with hypoxia and hypercapnia (HCC)-resolved as of 01/18/2021, (present on admission) -- Resolved.  COPD exacerbation resolved.   COPD with acute exacerbation ([place])- (present on admission) -- Resolved with standard treatment.  Discharged on oral steroids. Continue  BiPAP at night as able although the patient reports that he has not been able to use it at [facility] for many months now.   Acute on chronic diastolic CHF (congestive heart failure) ([place]) -- Echocardiogram October 2020 again showed normal LVEF, grade 1 diastolic dysfunction.  Significant volume overload now appears resolved.  Outpatient follow-up with PCP.  Consider diuretic in future if needed.   OSA (obstructive sleep apnea)- (present on admission) -- BiPAP nightly ideally   Type 2 diabetes mellitus with hyperglycemia ([place]) -- Hyperglycemic complicated by steroids, overall stable.  Apparently only on metformin as an outpatient.  Expect blood sugars to continue to improve as steroids are decreased. -- Resume metformin on discharge   Homeless -- Appreciate TOC, likely to return to [facility] where he has been living for several months despite the inability to use his BiPAP nightly.      Schizo affective schizophrenia ([place])- (present on admission) -- Patient not yet established with psychiatry in the area.  Continue Resporal and duloxetine as recommended by inpatient psychiatry consultation.  Outpatient establishment with psychiatric care recommended.   Obesity hypoventilation syndrome (HCC) -- Suspected diagnosis based on body habitus.  Weight loss as an outpatient recommended.   Hypertension associated with diabetes ([place])- (present on admission) -- Appears stable, continue amlodipine.   Cigarette smoker- (present on admission) -- Recommend cessation    Past Medical History:  Diagnosis Date   Bipolar 1 disorder, depressed ([place])    COPD (chronic obstructive pulmonary disease) ([place])    Depression with anxiety    Diabetes mellitus type II, non insulin dependent ([place]) 11/2020   A1c 7.3   Gastric ulcer    when on NSAIDs   GERD (gastroesophageal reflux disease)    HTN (hypertension)    OSA (obstructive sleep apnea)    not currently on CPAP   Rheumatoid arthritis ([place])     Right kidney mass, 2.8 cm 11/2020   Tobacco abuse     Past Surgical History:  Procedure Laterality Date   left index finger surgery Left     Family History  Problem Relation Age of Onset   Schizophrenia Mother    Diabetes Mellitus II Father     Social History   Socioeconomic History   Marital status: Single    Spouse name: Not on file   Number of children: Not on file   Years of education: Not on file   Highest education level: Not on file  Occupational History   Not on file  Tobacco Use   Smoking status: Every Day    Packs/day: 1.00    Types: Cigarettes   Smokeless tobacco: Never  Vaping Use   Vaping Use: Never used  Substance and Sexual Activity   Alcohol use: Not Currently   Drug use: Never   Sexual activity: Not on file  Other Topics Concern   Not on file  Social History Narrative   Not on file   Social Determinants of Health   Financial Resource Strain: Not on file  Food  Insecurity: Not on file  Transportation Needs: Not on file  Physical Activity: Not on file  Stress: Not on file  Social Connections: Not on file  Intimate Partner Violence: Not on file    Outpatient Medications Prior to Visit  Medication Sig Dispense Refill   albuterol (VENTOLIN HFA) 108 (90 Base) MCG/ACT inhaler Inhale 2 puffs into the lungs every 6  hours as needed for wheezing or shortness of breath. 8.5 g 2   amLODipine (NORVASC) 5 MG tablet Take 1 tablet (5 mg total) by mouth once daily. 30 tablet 0   DULoxetine (CYMBALTA) 60 MG capsule Take 1 capsule (60 mg total) by mouth once daily. 30 capsule 0   famotidine (PEPCID) 20 MG tablet Take 1 tablet (20 mg total) by mouth 2 (two) times daily. (Patient taking differently: Take 20 mg by mouth daily.) 30 tablet 0   fluticasone (FLONASE) 50 MCG/ACT nasal spray Place 2 sprays into both nostrils daily. 16 g 6   hydrOXYzine (ATARAX) 25 MG tablet Take 1 tablet (25 mg total) by mouth 3 (three) times daily as needed. (Patient taking  differently: Take 25 mg by mouth 3 (three) times daily as needed for anxiety.) 90 tablet 2   predniSONE (DELTASONE) 10 MG tablet Take 4 tabs by mouth daily with breakfast for 3 days,  2 tabs daily with breakfast for 3 days, THEN 1 tablet  daily with breakfast for 3 days. 21 tablet 0   risperidone (RISPERDAL) 4 MG tablet Take 1 tablet (4 mg total) by mouth once daily. 30 tablet 0   Spacer/Aero-Holding Chambers (AEROCHAMBER PLUS WITH MASK) inhaler Use as directed 1 each 0   amLODipine (NORVASC) 5 MG tablet Take 1 tablet (5 mg total) by mouth once daily. 30 tablet 0   budesonide-formoterol (SYMBICORT) 80-4.5 MCG/ACT inhaler Inhale 2 puffs into the lungs 2 (two) times daily. 10.2 g 0   metFORMIN (GLUCOPHAGE) 500 MG tablet Take 1 tablet (500 mg total) by mouth 2 (two) times daily with a meal. 60 tablet 0   risperidone (RISPERDAL) 4 MG tablet Take 1 tablet (4 mg total) by mouth once daily. 30 tablet 0   DULoxetine (CYMBALTA) 60 MG capsule Take 1 capsule (60 mg total) by mouth once daily. 30 capsule 0   famotidine (PEPCID) 20 MG tablet Take 1 tablet (20 mg total) by mouth 2 (two) times daily. 30 tablet 0   metFORMIN (GLUCOPHAGE) 500 MG tablet Take 1 tablet (500 mg total) by mouth 2 (two) times daily with a meal. 60 tablet 0   No facility-administered medications prior to visit.    No Known Allergies  ROS Review of Systems  Constitutional:  Negative for fever.  HENT:  Negative for congestion, hearing loss and rhinorrhea.   Eyes:  Positive for discharge and visual disturbance.  Respiratory:  Positive for cough, chest tightness and shortness of breath.   Cardiovascular:  Positive for palpitations.  Gastrointestinal:  Positive for diarrhea. Negative for vomiting.  Endocrine: Positive for polydipsia, polyphagia and polyuria.  Genitourinary:  Positive for frequency and urgency. Negative for dysuria, hematuria and scrotal swelling.  Musculoskeletal:  Positive for back pain and myalgias.  Skin:   Negative for rash.  Neurological:  Positive for light-headedness (with standing or sitting down quickly). Negative for syncope.  Psychiatric/Behavioral:  Positive for dysphoric mood and sleep disturbance. Negative for suicidal ideas. The patient is nervous/anxious.      Objective:    Physical Exam Constitutional:      Appearance: He is  obese.  HENT:     Head: Normocephalic and atraumatic.     Nose: Nose normal.     Mouth/Throat:     Mouth: Mucous membranes are moist.     Pharynx: Oropharynx is clear.  Eyes:     General:        Left eye: Discharge present. Cardiovascular:     Rate and Rhythm: Normal rate and regular rhythm.     Heart sounds: Heart sounds are distant. No murmur heard.   No friction rub. No gallop.  Pulmonary:     Breath sounds: Examination of the right-upper field reveals wheezing. Examination of the left-upper field reveals wheezing. Decreased breath sounds and wheezing present.  Musculoskeletal:     Right lower leg: Edema present.     Left lower leg: Edema present.  Skin:    General: Skin is warm and dry.  Psychiatric:        Attention and Perception: Attention normal.        Mood and Affect: Mood is depressed.        Speech: Speech normal.        Behavior: Behavior normal.    BP 110/80    Pulse (!) 105    Resp 16    Wt (!) 370 lb 12.8 oz (168.2 kg)    SpO2 96% Comment: new reading   BMI 56.38 kg/m  Wt Readings from Last 3 Encounters:  01/25/21 (!) 370 lb 12.8 oz (168.2 kg)  01/17/21 (!) 362 lb 14 oz (164.6 kg)  10/23/20 (!) 370 lb (167.8 kg)     Health Maintenance Due  Topic Date Due   FOOT EXAM  Never done   OPHTHALMOLOGY EXAM  Never done   URINE MICROALBUMIN  Never done   Hepatitis C Screening  Never done   COLONOSCOPY (Pts 45-7yr Insurance coverage will need to be confirmed)  Never done    There are no preventive care reminders to display for this patient.  No results found for: TSH Lab Results  Component Value Date   WBC 11.5 (H)  01/14/2021   HGB 15.0 01/14/2021   HCT 50.3 01/14/2021   MCV 95.8 01/14/2021   PLT 189 01/14/2021   Lab Results  Component Value Date   NA 139 01/18/2021   K 3.7 01/18/2021   CO2 32 01/18/2021   GLUCOSE 126 (H) 01/18/2021   BUN 28 (H) 01/18/2021   CREATININE 0.94 01/18/2021   BILITOT 0.8 11/16/2020   ALKPHOS 68 11/16/2020   AST 19 11/16/2020   ALT 21 11/16/2020   PROT 6.8 11/16/2020   ALBUMIN 3.6 11/16/2020   CALCIUM 8.6 (L) 01/18/2021   ANIONGAP 10 01/18/2021   No results found for: CHOL No results found for: HDL No results found for: LDLCALC No results found for: TRIG No results found for: CHOLHDL Lab Results  Component Value Date   HGBA1C 7.7 (A) 01/25/2021      Assessment & Plan:   Problem List Items Addressed This Visit       Cardiovascular and Mediastinum   Hypertension associated with diabetes ([place] - Primary    Blood pressure is at goal as of today's visit. Continue with amlodipine as previously prescribed.       Relevant Medications   metFORMIN (GLUCOPHAGE) 500 MG tablet   dapagliflozin propanediol (FARXIGA) 10 MG TABS tablet   atorvastatin (LIPITOR) 10 MG tablet   Other Relevant Orders   CMP14+EGFR   CBC with Differential     Respiratory  COPD with acute exacerbation (HCC)    Acute exacerbation is improving. Continue with steroids until course is finished, as well as Symbicort and albuterol inhalers. Discuss with [facility] how to get setup for BiPAP machine in place so that it may be used nightly.       Relevant Medications   budesonide-formoterol (SYMBICORT) 80-4.5 MCG/ACT inhaler     Endocrine   Type 2 diabetes mellitus with hyperglycemia (HCC)    Blood sugar remains elevated (199 mg/dL) today, and HgbA1C remains high at 7.7%. Elevated sugars also likely impacted by oral steroids he is taking.   Increase metformin dosing to 1000 mg BID. Add Farxiga to regimen, 66m once daily. Begin atorvastatin 10 mg daily.       Relevant  Medications   metFORMIN (GLUCOPHAGE) 500 MG tablet   dapagliflozin propanediol (FARXIGA) 10 MG TABS tablet   atorvastatin (LIPITOR) 10 MG tablet   Other Relevant Orders   Microalbumin/Creatinine Ratio, Urine   POCT glucose (manual entry) (Completed)   HgB A1c (Completed)   Lipid panel   Adrenal hyperplasia ([place]     Other   Schizo affective schizophrenia ([place]    Continue with duloxetine and risperidone as recommended by inpatient psychiatry consultation. Establish care with outpatient mental health/psychiatry.      Tobacco abuse       Current smoking consumption amount: 1PPD or more  Dicsussion on advise to quit smoking and smoking impacts: CV lung impacts  Patient's willingness to quit:  Not ready to quit completely  Methods to quit smoking discussed:  Behavioral mod and nicotine replacemtn  No prescribed  Medication management of smoking session drugs discussed: no meds prescribed  Setting quit date not established   Follow-up arranged  Weekly at shelter clinic   Time spent counseling the patient:  557m        Cigarette smoker    Discussed decreasing smoking frequency to improve health and current symptoms as well.       Need for hepatitis C screening test    Hepatitis C screening ordered.       Relevant Orders   HCV Ab w Reflex to Quant PCR   Encounter for health-related screening    Full set of screening labs to be obtained today including lipid panel, CBC, CMP, and urine microalbumin.        Meds ordered this encounter  Medications   budesonide-formoterol (SYMBICORT) 80-4.5 MCG/ACT inhaler    Sig: Inhale 2 puffs into the lungs 2 (two) times daily.    Dispense:  10.2 g    Refill:  0    [employer], lost last inhaler   metFORMIN (GLUCOPHAGE) 500 MG tablet    Sig: Take 2 tablets (1,000 mg total) by mouth 2 (two) times daily with a meal.    Dispense:  120 tablet    Refill:  0    Congregational nurse, dose change   dapagliflozin  propanediol (FARXIGA) 10 MG TABS tablet    Sig: Take 1 tablet (10 mg total) by mouth daily before breakfast.    Dispense:  30 tablet    Refill:  0    [employer]   atorvastatin (LIPITOR) 10 MG tablet    Sig: Take 1 tablet (10 mg total) by mouth daily.    Dispense:  30 tablet    Refill:  3    [employer]    Follow-up: Follow up with Dr. WrJoya Gaskinsn 2 months.    PaAsencion NobleMD

## 2021-01-25 NOTE — Assessment & Plan Note (Addendum)
Full set of screening labs to be obtained today including lipid panel, CBC, CMP, and urine microalbumin.

## 2021-01-25 NOTE — Assessment & Plan Note (Signed)
° ° °•   Current smoking consumption amount: 1PPD or more  Dicsussion on advise to quit smoking and smoking impacts: CV lung impacts  Patient's willingness to quit:  Not ready to quit completely  Methods to quit smoking discussed:  Behavioral mod and nicotine replacemtn  No prescribed  Medication management of smoking session drugs discussed: no meds prescribed  Setting quit date not established   Follow-up arranged  Weekly at shelter clinic   Time spent counseling the patient:  5min   

## 2021-01-25 NOTE — Assessment & Plan Note (Signed)
Discussed decreasing smoking frequency to improve health and current symptoms as well.

## 2021-01-25 NOTE — Patient Instructions (Addendum)
Increase metformin  to 1000 mg twice daily that is 2 tablets twice daily the nurse will work with you on the dosing change  Begin Farxiga 1 pill daily for diabetes this will be brought to you on Wednesday at the shelter  Stay on amlodipine 1 daily  Begin atorvastatin 1 pill daily this will be brought to you on Wednesday for increased cholesterol  Stay on Symbicort 2 inhalations twice daily we will get you a new inhaler this week  Finish your prednisone  We will partner with the shelter director to get a table to place her BiPAP machine upon  We will attempt to get you in with the mental health clinic  Complete set of screening labs obtained today  Focus on reducing amount of tobacco products cigarettes or smoking  Return to see [name] in 2 months here at health and wellness clinic we will arrange for this  We are working on getting an appointment for you at an eye doctor that sees our patients who stay at [facility]

## 2021-01-25 NOTE — Assessment & Plan Note (Signed)
Blood pressure is at goal as of today's visit. Continue with amlodipine as previously prescribed.

## 2021-01-25 NOTE — Assessment & Plan Note (Addendum)
Blood sugar remains elevated (199 mg/dL) today, and [id number] remains high at 7.7%. Elevated sugars also likely impacted by oral steroids he is taking.   Increase metformin dosing to 1000 mg BID. Add Farxiga to regimen, 10mg  once daily. Begin atorvastatin 10 mg daily.

## 2021-01-25 NOTE — Assessment & Plan Note (Signed)
Acute exacerbation is improving. Continue with steroids until course is finished, as well as Symbicort and albuterol inhalers. Discuss with [facility] how to get setup for BiPAP machine in place so that it may be used nightly.

## 2021-01-25 NOTE — Assessment & Plan Note (Addendum)
Continue with duloxetine and risperidone as recommended by inpatient psychiatry consultation. Establish care with outpatient mental health/psychiatry.

## 2021-01-25 NOTE — Assessment & Plan Note (Signed)
Hepatitis-C screening ordered.

## 2021-01-26 LAB — CBC WITH DIFFERENTIAL/PLATELET
Basophils Absolute: 0.1 10*3/uL (ref 0.0–0.2)
Basos: 1 %
EOS (ABSOLUTE): 0.1 10*3/uL (ref 0.0–0.4)
Eos: 1 %
Hematocrit: 52 % — ABNORMAL HIGH (ref 37.5–51.0)
Hemoglobin: 17.1 g/dL (ref 13.0–17.7)
Immature Grans (Abs): 0.1 10*3/uL (ref 0.0–0.1)
Immature Granulocytes: 1 %
Lymphocytes Absolute: 1.7 10*3/uL (ref 0.7–3.1)
Lymphs: 11 %
MCH: 28.7 pg (ref 26.6–33.0)
MCHC: 32.9 g/dL (ref 31.5–35.7)
MCV: 87 fL (ref 79–97)
Monocytes Absolute: 1 10*3/uL — ABNORMAL HIGH (ref 0.1–0.9)
Monocytes: 6 %
Neutrophils Absolute: 12.6 10*3/uL — ABNORMAL HIGH (ref 1.4–7.0)
Neutrophils: 80 %
Platelets: 227 10*3/uL (ref 150–450)
RBC: 5.95 x10E6/uL — ABNORMAL HIGH (ref 4.14–5.80)
RDW: 12.9 % (ref 11.6–15.4)
WBC: 15.5 10*3/uL — ABNORMAL HIGH (ref 3.4–10.8)

## 2021-01-26 LAB — CMP14+EGFR
ALT: 26 IU/L (ref 0–44)
AST: 13 IU/L (ref 0–40)
Albumin/Globulin Ratio: 1.7 (ref 1.2–2.2)
Albumin: 4.2 g/dL (ref 4.0–5.0)
Alkaline Phosphatase: 90 IU/L (ref 44–121)
BUN/Creatinine Ratio: 20 (ref 9–20)
BUN: 18 mg/dL (ref 6–24)
Bilirubin Total: 0.3 mg/dL (ref 0.0–1.2)
CO2: 28 mmol/L (ref 20–29)
Calcium: 9.1 mg/dL (ref 8.7–10.2)
Chloride: 99 mmol/L (ref 96–106)
Creatinine, Ser: 0.91 mg/dL (ref 0.76–1.27)
Globulin, Total: 2.5 g/dL (ref 1.5–4.5)
Glucose: 167 mg/dL — ABNORMAL HIGH (ref 70–99)
Potassium: 4.6 mmol/L (ref 3.5–5.2)
Sodium: 143 mmol/L (ref 134–144)
Total Protein: 6.7 g/dL (ref 6.0–8.5)
eGFR: 104 mL/min/{1.73_m2} (ref >59)

## 2021-01-26 LAB — LIPID PANEL
Chol/HDL Ratio: 4.4 ratio (ref 0.0–5.0)
Cholesterol, Total: 186 mg/dL (ref 100–199)
HDL: 42 mg/dL (ref >39)
LDL Chol Calc (NIH): 99 mg/dL (ref 0–99)
Triglycerides: 268 mg/dL — ABNORMAL HIGH (ref 0–149)
VLDL Cholesterol Cal: 45 mg/dL — ABNORMAL HIGH (ref 5–40)

## 2021-01-26 LAB — MICROALBUMIN / CREATININE URINE RATIO
Creatinine, Urine: 86 mg/dL
Microalb/Creat Ratio: 9 mg/g creat (ref 0–29)
Microalbumin, Urine: 7.8 ug/mL

## 2021-01-26 LAB — HCV INTERPRETATION

## 2021-01-26 LAB — HCV AB W REFLEX TO QUANT PCR: HCV Ab: <0.1 s/co ratio (ref 0.0–0.9)

## 2021-01-27 ENCOUNTER — Other Ambulatory Visit (HOSPITAL_COMMUNITY): Payer: Self-pay

## 2021-01-27 ENCOUNTER — Encounter: Payer: Self-pay | Admitting: Physician Assistant

## 2021-01-27 ENCOUNTER — Other Ambulatory Visit: Payer: Self-pay | Admitting: Critical Care Medicine

## 2021-01-27 MED ORDER — GUAIFENESIN ER 600 MG PO TB12
600.0000 mg | ORAL_TABLET | Freq: Two times a day (BID) | ORAL | 2 refills | Status: DC
  Filled 2021-01-27: qty 40, 20d supply, fill #0
  Filled 2021-01-27: qty 60, 30d supply, fill #0
  Filled 2021-01-27: qty 40, 20d supply, fill #0

## 2021-01-27 MED ORDER — ERYTHROMYCIN 5 MG/GM OP OINT
1.0000 "application " | TOPICAL_OINTMENT | Freq: Every day | OPHTHALMIC | 0 refills | Status: DC
  Filled 2021-01-27: qty 3.5, 3d supply, fill #0

## 2021-01-27 MED ORDER — DOXYCYCLINE HYCLATE 100 MG PO TABS
100.0000 mg | ORAL_TABLET | Freq: Two times a day (BID) | ORAL | 0 refills | Status: DC
  Filled 2021-01-27: qty 20, 10d supply, fill #0

## 2021-01-27 NOTE — Progress Notes (Signed)
Pt seen by Dr Jorge Ny reviewed w/ pt. WBCs are elevated, H&H chronically high from hypoxia.   C/o soreness around his ribcage and back Feels he is coughing up stuff but can't get it all the way out, too thick.   Has probs w/ incontinence, starts the stream well, but may not be emptying fully. Has no hx prostate probs. Will not refer to Urology right now.   Has seen [employer] to get him disability insurance, filed in 09/2020, has not heard back. Sara[facility] will call.   He has some broken teeth that have been covered by the gums, upper teeth. The spikes are poking thru the gums and are sharp.   He is taking meds as prescribed.  The pill box is helping.   Still w/ some eye drainage.  Lungs w/ wheezing and basilar rhonchi on exam.  CXR on 01/13/2021 still abnl but much improved from admission.  HR 103, 96% RA, 124/76  Start: Doxy 100 mg x 10 days, Guaifenisen for cough, Egel for his eyes.     Lab Results  Component Value Date   WBC 15.5 (H) 01/25/2021   HGB 17.1 01/25/2021   HCT 52.0 (H) 01/25/2021   MCV 87 01/25/2021   PLT 227 01/25/2021   No results for input(s): INR in the last 72 hours.  Recent Labs  Lab 01/25/21 1034  NA 143  K 4.6  CL 99  CO2 28  BUN 18  CREATININE 0.91  CALCIUM 9.1  PROT 6.7  BILITOT 0.3  ALKPHOS 90  ALT 26  AST 13  GLUCOSE 167*   Lab Results  Component Value Date   CHOL 186 01/25/2021   HDL 42 01/25/2021   LDLCALC 99 01/25/2021   TRIG 268 (H) 01/25/2021   CHOLHDL 4.4 01/25/2021    Lab Results  Component Value Date   HGBA1C 7.7 (A) 01/25/2021   Tarin Navarez, PA-C 01/27/2021 2:24 PM

## 2021-01-28 ENCOUNTER — Other Ambulatory Visit: Payer: Self-pay | Admitting: *Deleted

## 2021-02-03 ENCOUNTER — Encounter: Payer: Self-pay | Admitting: Physician Assistant

## 2021-02-03 ENCOUNTER — Other Ambulatory Visit: Payer: Self-pay | Admitting: Critical Care Medicine

## 2021-02-03 ENCOUNTER — Other Ambulatory Visit: Payer: Self-pay | Admitting: Physician Assistant

## 2021-02-03 ENCOUNTER — Other Ambulatory Visit (HOSPITAL_COMMUNITY): Payer: Self-pay

## 2021-02-03 MED ORDER — TAMSULOSIN HCL 0.4 MG PO CAPS
0.4000 mg | ORAL_CAPSULE | Freq: Every day | ORAL | 3 refills | Status: DC
  Filled 2021-02-03: qty 30, 30d supply, fill #0

## 2021-02-03 NOTE — Progress Notes (Signed)
Pt seen by Dr Delford Field.  He has been using the BiPAP, but is not sure it is helping.   However, he appears to have more energy and is speaking in longer sentences and more spontaneously.   He has not been using the eye gel, it was not in his daily med box.   The L eye looks good, the R eye is still injected and red, no drainage. The gel was located in his bag and applied.   He is compliant with oral meds, Helen loads the med box weekly.  He is still having problems with urinary and  bowel incontinence, urinary urge and bowel urge. He needs to see a Urologist, needs referral for pelvic floor exercises.   He is not using the spacer, not sure how to do it. This was demonstrated with him.   Lungs are clear bilaterally. Minimal LE edema.   BP 126/85  HR 96, 99% O2  He may be getting help to get into assisted living. [place] Dec is on this.  [name], PA-C 02/03/2021 2:44 PM

## 2021-02-04 ENCOUNTER — Other Ambulatory Visit: Payer: Self-pay | Admitting: *Deleted

## 2021-02-04 MED ORDER — FAMOTIDINE 20 MG PO TABS
20.0000 mg | ORAL_TABLET | Freq: Two times a day (BID) | ORAL | 0 refills | Status: DC
  Filled 2021-02-04: qty 30, 15d supply, fill #0

## 2021-02-05 ENCOUNTER — Other Ambulatory Visit (HOSPITAL_COMMUNITY): Payer: Self-pay

## 2021-02-10 ENCOUNTER — Other Ambulatory Visit: Payer: Self-pay | Admitting: Critical Care Medicine

## 2021-02-10 ENCOUNTER — Encounter: Payer: Self-pay | Admitting: Physician Assistant

## 2021-02-10 ENCOUNTER — Inpatient Hospital Stay: Payer: Self-pay | Admitting: Physician Assistant

## 2021-02-10 NOTE — Progress Notes (Signed)
Pt seen by Dr Delford Field  They are arranging a bed at [facility]!!!! He has gotten emergency Medicaid but he has not gotten the card yet.   He got a good bath this past week. That felt good.  He is wearing the BiPAP last pm.   He is finding the pill organizer very helpful. He has finished all the ABX.  He has been using the eye drop/get and his eye is improving.   He has lost a little bit of weight, but still w/ 2+ LE edema.   104/75, HR 101 94% O2 R eye is still a little red, but no drainage. Continue drops.   He has appts on 2-6 and 2-8 for disability.   Deb presented him with the new sweatshirt, shirts and his shirts were changed.   [name], PA-C 02/10/2021 3:46 PM

## 2021-02-11 ENCOUNTER — Other Ambulatory Visit: Payer: Self-pay | Admitting: *Deleted

## 2021-02-11 ENCOUNTER — Other Ambulatory Visit (HOSPITAL_COMMUNITY): Payer: Self-pay

## 2021-02-11 MED ORDER — AMLODIPINE BESYLATE 5 MG PO TABS
5.0000 mg | ORAL_TABLET | Freq: Every day | ORAL | 0 refills | Status: DC
  Filled 2021-02-11: qty 30, 30d supply, fill #0

## 2021-02-11 MED ORDER — ATORVASTATIN CALCIUM 10 MG PO TABS
10.0000 mg | ORAL_TABLET | Freq: Every day | ORAL | 3 refills | Status: DC
  Filled 2021-02-11: qty 30, 30d supply, fill #0

## 2021-02-11 MED ORDER — DULOXETINE HCL 60 MG PO CPEP
60.0000 mg | ORAL_CAPSULE | Freq: Every day | ORAL | 0 refills | Status: DC
  Filled 2021-02-11: qty 30, 30d supply, fill #0

## 2021-02-11 MED ORDER — RISPERIDONE 4 MG PO TABS
4.0000 mg | ORAL_TABLET | Freq: Every day | ORAL | 0 refills | Status: DC
  Filled 2021-02-11: qty 30, 30d supply, fill #0

## 2021-02-12 ENCOUNTER — Telehealth: Payer: Self-pay | Admitting: Pharmacist

## 2021-02-12 NOTE — Telephone Encounter (Signed)
Patient failed to provide requested 2023 financial documentation. No additional medication assistance will be provided by [facility] without the required proof of income documentation. Patient notified by letter. ? ?Raymond Hood ?Medication Management ?

## 2021-02-17 ENCOUNTER — Encounter: Payer: Self-pay | Admitting: Physician Assistant

## 2021-02-17 NOTE — Progress Notes (Signed)
Pt seen by Dr Joya Gaskins  He still has some redness and R eye discomfort. However, it is improved and does not need any more get.   Is swelling and is back up to 390 lbs.  Problems w/ incontinence, despite wearing diapers. Urination is likely increased by [place].   Takes meds as long as [name] fills the boxes.   Has 2+ pitting pretibial edema. Also w/ some abd edema.  Unable to use automatic cuff.  Manual BP 135/85,  HR 105, 92-97% O2  No wheezing or rales on lung exam. Taking 'tokes' of his inhalers as directed.   He needs a bladder MD, but does not have Medicaid yet.   Willing to go to Assisted Living, that is hopefully soon.   [name], PA-C 02/17/2021 3:41 PM

## 2021-02-18 ENCOUNTER — Other Ambulatory Visit: Payer: Self-pay | Admitting: *Deleted

## 2021-02-18 MED ORDER — DAPAGLIFLOZIN PROPANEDIOL 10 MG PO TABS
10.0000 mg | ORAL_TABLET | Freq: Every day | ORAL | 0 refills | Status: DC
  Filled 2021-02-18 – 2021-02-25 (×2): qty 30, 30d supply, fill #0

## 2021-02-18 MED ORDER — AMLODIPINE BESYLATE 5 MG PO TABS
5.0000 mg | ORAL_TABLET | Freq: Every day | ORAL | 0 refills | Status: DC
  Filled 2021-02-18 – 2021-02-25 (×2): qty 30, 30d supply, fill #0

## 2021-02-18 MED ORDER — ATORVASTATIN CALCIUM 10 MG PO TABS
10.0000 mg | ORAL_TABLET | Freq: Every day | ORAL | 3 refills | Status: DC
  Filled 2021-02-18 – 2021-02-25 (×2): qty 30, 30d supply, fill #0
  Filled 2021-03-18: qty 30, 30d supply, fill #1

## 2021-02-18 MED ORDER — RISPERIDONE 4 MG PO TABS
4.0000 mg | ORAL_TABLET | Freq: Every day | ORAL | 0 refills | Status: DC
  Filled 2021-02-18 – 2021-02-25 (×2): qty 30, 30d supply, fill #0

## 2021-02-18 MED ORDER — DULOXETINE HCL 60 MG PO CPEP
60.0000 mg | ORAL_CAPSULE | Freq: Every day | ORAL | 0 refills | Status: DC
  Filled 2021-02-18 – 2021-02-25 (×2): qty 30, 30d supply, fill #0

## 2021-02-18 MED ORDER — FAMOTIDINE 20 MG PO TABS
20.0000 mg | ORAL_TABLET | Freq: Two times a day (BID) | ORAL | 0 refills | Status: DC
  Filled 2021-02-18 – 2021-02-25 (×2): qty 30, 15d supply, fill #0

## 2021-02-19 ENCOUNTER — Other Ambulatory Visit: Payer: Self-pay

## 2021-02-23 ENCOUNTER — Other Ambulatory Visit: Payer: Self-pay

## 2021-02-24 ENCOUNTER — Other Ambulatory Visit: Payer: Self-pay | Admitting: Family Medicine

## 2021-02-24 ENCOUNTER — Encounter: Payer: Self-pay | Admitting: Physician Assistant

## 2021-02-24 DIAGNOSIS — H1031 Unspecified acute conjunctivitis, right eye: Secondary | ICD-10-CM

## 2021-02-24 MED ORDER — POLYMYXIN B-TRIMETHOPRIM 10000-0.1 UNIT/ML-% OP SOLN
1.0000 [drp] | Freq: Four times a day (QID) | OPHTHALMIC | 0 refills | Status: DC
  Filled 2021-02-24: qty 10, 30d supply, fill #0

## 2021-02-24 MED ORDER — GABAPENTIN 100 MG PO CAPS
100.0000 mg | ORAL_CAPSULE | Freq: Every day | ORAL | 0 refills | Status: DC
  Filled 2021-02-24: qty 30, 30d supply, fill #0

## 2021-02-24 NOTE — Progress Notes (Addendum)
Raymond Hood  51 yo with HTN , GERD, COPD, and multiple other medical issues here today with concern for back pain and conjunctivitis.  Notes back pain worse when he's up and about, better when able to lie down.  He's on cymbalta, lidocaine patch doesn't help, tylenol doesn't help.  Has blurry vision associated with conjunctivitis.  VS HR 101, 97% RA, BP 109/68.  No significant TTP of back on palpation.   Crusting to right eyelashes, injected conjunctiva. A/P Add gabapentin for back pain, low dose with risperdal - uptitrate as tolerated polytrim for his conjunctivitis  11/2020 imaging with solid renal mass concerning for RCC - not clear this was evaluated?  Will look back in the chart some more.  Raymond Nicks, MD -----  Pt seen by Dr Lowell Guitar.  C/o Lumbar back pain, more on the Right. Has had before, but is bad now. 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 His legs get numb if he sits still too long.   Dr Lowell Guitar to start gabapentin and see if it helps. Low dose because of his other medications.  Not sure if the back pain is from fibromyalgia or not. Takes Tylenol prn but it is not really helping. He is uncomfortable sitting and standing, only comfortable lying down.   His R eye has blurred vision. Is getting worse. Has some drainage and crusty eyelashes on the R.  L eye is a little blurry, but not as bad as the right.   Has not heard about disability, Raymond Hood is helping with this. She has spoken to his attorney. Raymond Hood to f/u with Raymond Hood.   CT scan 11/16/2020 showed bilateral renal masses concerning for renal carcinoma. This will need more aggressive care.  Raymond Demark, PA-C 02/24/2021 3:14 PM

## 2021-02-25 ENCOUNTER — Other Ambulatory Visit (HOSPITAL_COMMUNITY): Payer: Self-pay

## 2021-02-26 ENCOUNTER — Other Ambulatory Visit (HOSPITAL_COMMUNITY): Payer: Self-pay

## 2021-02-26 ENCOUNTER — Telehealth: Payer: Self-pay | Admitting: Pharmacy Technician

## 2021-02-26 NOTE — Telephone Encounter (Signed)
Patient failed to provide 2023 proof of income.  No additional medication assistance will be provided by [facility] without the required proof of income documentation.  Patient notified by letter.  [place],   [id number]  February 12, 2021    Dear Annalee Genta:  This is to inform you that you are no longer eligible to receive medication assistance at [facility].  The reason(s) are:    _____Your total gross monthly household income exceeds 300% of the Federal Poverty Level.   _____Tangible assets (savings, checking, stocks/bonds, pension, retirement, etc.) exceeds our limit  _____You are eligible to receive benefits from [facility], [facility] or HIV Medication              Assistance Program _____You are eligible to receive benefits from a Medicare Part D plan _____You have prescription insurance  _____You are not an [facility] resident __X__Failure to provide all requested documentation (proof of income for 2023, and/or Patient Intake Application, DOH Attestation, Contract, etc).    Medication assistance will resume once all requested documentation has been returned to our clinic.  If you have questions, please contact our clinic at [phone].    Thank you,  [facility]

## 2021-03-03 ENCOUNTER — Encounter: Payer: Self-pay | Admitting: Physician Assistant

## 2021-03-03 NOTE — Progress Notes (Signed)
Pt seen by Dr Joya Gaskins.   He feels stiff and sore all over. He has been having some lower back pain. It is worse when he has to move.   He has been like that for a couple of days.   No fever or chills, no cough.  Smoking 1/2 ppd, using inhaler.  He has not heard anything about disability, has been asking Emeterio Reeve about it.   BP 112/75 HR 98,  95% O2 sats CBG 134  His R eye is reddened, sore, and itches at times. He thought it started with something in his eye. He used up the trimethoprim polymyxin drops, but does not think they helped much.  Previously, he finished the gel eye medication.   Does not know when his last eye exam was.   [name], PA-C 03/03/2021 3:51 PM

## 2021-03-04 ENCOUNTER — Other Ambulatory Visit: Payer: Self-pay | Admitting: *Deleted

## 2021-03-04 ENCOUNTER — Other Ambulatory Visit (HOSPITAL_COMMUNITY): Payer: Self-pay

## 2021-03-04 MED ORDER — TAMSULOSIN HCL 0.4 MG PO CAPS
0.4000 mg | ORAL_CAPSULE | Freq: Every day | ORAL | 3 refills | Status: DC
  Filled 2021-03-04: qty 30, 30d supply, fill #0

## 2021-03-04 MED ORDER — METFORMIN HCL 500 MG PO TABS
1000.0000 mg | ORAL_TABLET | Freq: Two times a day (BID) | ORAL | 0 refills | Status: DC
  Filled 2021-03-04: qty 120, 30d supply, fill #0

## 2021-03-04 MED ORDER — RISPERIDONE 4 MG PO TABS: 4.0000 mg | ORAL_TABLET | Freq: Every day | ORAL | 0 refills | Status: DC

## 2021-03-10 ENCOUNTER — Other Ambulatory Visit (HOSPITAL_COMMUNITY): Payer: Self-pay

## 2021-03-10 ENCOUNTER — Encounter: Payer: Self-pay | Admitting: Physician Assistant

## 2021-03-10 ENCOUNTER — Other Ambulatory Visit: Payer: Self-pay | Admitting: Critical Care Medicine

## 2021-03-10 MED ORDER — GABAPENTIN 300 MG PO CAPS
300.0000 mg | ORAL_CAPSULE | Freq: Three times a day (TID) | ORAL | 0 refills | Status: DC
  Filled 2021-03-10: qty 90, 30d supply, fill #0

## 2021-03-10 NOTE — Progress Notes (Signed)
Pt seen by Dr Delford Field. ? ?He saw the eye doctor, no infection, cataract, has irritation. Was put on Eye vitamins. ? ?Still compliant w/ CPAP ? ?Says legs aren't swelling. ? ?Does not need med refills now, but wonders if we can increase the gabapentin dose. Still on Cymbalta. Gabapentin increased to 300 mg tid ? ?Does not know what his blood sugars are running. CBG 175 in the clinic. Taking the metformin and [place].  ? ?He was denied disability. He has a Clinical research associate at [employer], [employer]. is working with him on this.  ? ?Dr Delford Field will do a letter to explain why he needs disability.  ? ?121/80, 97 98%, exp wheeze, 1+ edema. Wt 372 lbs. ? ?[name], PA-C ?03/10/2021 ?2:43 PM ? ? ? ? ? ? ? ? ? ?

## 2021-03-11 ENCOUNTER — Other Ambulatory Visit (HOSPITAL_COMMUNITY): Payer: Self-pay

## 2021-03-11 ENCOUNTER — Other Ambulatory Visit: Payer: Self-pay | Admitting: *Deleted

## 2021-03-11 MED ORDER — FAMOTIDINE 20 MG PO TABS
20.0000 mg | ORAL_TABLET | Freq: Two times a day (BID) | ORAL | 0 refills | Status: DC
  Filled 2021-03-11: qty 30, 15d supply, fill #0

## 2021-03-12 ENCOUNTER — Other Ambulatory Visit (HOSPITAL_COMMUNITY): Payer: Self-pay

## 2021-03-12 MED ORDER — PRESERVISION AREDS 2 PO CAPS
1.0000 | ORAL_CAPSULE | Freq: Two times a day (BID) | ORAL | 6 refills | Status: DC
  Filled 2021-03-12: qty 120, 30d supply, fill #0

## 2021-03-17 ENCOUNTER — Encounter: Payer: Self-pay | Admitting: Critical Care Medicine

## 2021-03-17 NOTE — Progress Notes (Signed)
Patient ID: Raymond Hood , male   DOB: 01/07/1971, 51 y.o.   MRN: [id number] ? ?The patient was seen today at the [facility] he is at baseline his dyspnea is stable continues to have right eye irritation from the fact that his CPAP mask leaks dries out the right eye he is on eyedrops at this time and PreserVision tablets ? ?On exam blood pressure 97/59 saturation 94% room air ? ?Chest was clear without evidence of wheeze rales rhonchi ?Cardiac exam unremarkable ?Abdomen protuberant ?1+ edema in the lower extremities ? ?Impression is COPD asthma severe sleep apnea chronic hypoxemia all stable ?Allergic rhinitis ?Plan for this patient is to continue current medications no changes made ?Plan also for this patient is to obtain a letter stating his disability ? ?Plan also is to fill out his paperwork for housing ? ? ?

## 2021-03-18 ENCOUNTER — Other Ambulatory Visit (HOSPITAL_COMMUNITY): Payer: Self-pay

## 2021-03-18 ENCOUNTER — Other Ambulatory Visit: Payer: Self-pay | Admitting: *Deleted

## 2021-03-18 MED ORDER — DULOXETINE HCL 60 MG PO CPEP
60.0000 mg | ORAL_CAPSULE | Freq: Every day | ORAL | 3 refills | Status: DC
  Filled 2021-03-18: qty 30, 30d supply, fill #0

## 2021-03-18 MED ORDER — DAPAGLIFLOZIN PROPANEDIOL 10 MG PO TABS
10.0000 mg | ORAL_TABLET | Freq: Every day | ORAL | 3 refills | Status: DC
  Filled 2021-03-18: qty 30, 30d supply, fill #0

## 2021-03-18 MED ORDER — AMLODIPINE BESYLATE 5 MG PO TABS
5.0000 mg | ORAL_TABLET | Freq: Every day | ORAL | 3 refills | Status: DC
  Filled 2021-03-18: qty 30, 30d supply, fill #0

## 2021-03-18 NOTE — Progress Notes (Unsigned)
Mucinex discontinued, course has finished ?

## 2021-03-23 ENCOUNTER — Ambulatory Visit (INDEPENDENT_AMBULATORY_CARE_PROVIDER_SITE_OTHER): Payer: Self-pay | Admitting: Podiatry

## 2021-03-23 ENCOUNTER — Encounter: Payer: Self-pay | Admitting: Podiatry

## 2021-03-23 ENCOUNTER — Other Ambulatory Visit: Payer: Self-pay

## 2021-03-23 DIAGNOSIS — E1142 Type 2 diabetes mellitus with diabetic polyneuropathy: Secondary | ICD-10-CM

## 2021-03-23 DIAGNOSIS — M79676 Pain in unspecified toe(s): Secondary | ICD-10-CM

## 2021-03-23 DIAGNOSIS — B351 Tinea unguium: Secondary | ICD-10-CM

## 2021-03-23 NOTE — Progress Notes (Signed)
He presents today for a nail trim he is complaining that his toenails are long thick yellow and dystrophic and painful he states that they feel like back because every time he tries to put his socks on.  He states that he started to get some burning in his feet but is not all the time.  States that his last A1c was at 7.7. ? ?Objective: Vital signs are stable he is alert oriented x3 feet are somewhat dirty unable to keep them clean most likely due to living conditions.  Pulses are palpable though sensation is intact.  His nails are long thick yellow dystrophic with mycotic painful palpation as well as debridement. ? ?Assessment: Pain in limb secondary to onychomycosis diabetes type 2 with onychomycosis and diabetic peripheral neuropathy. ? ?Plan: Debride nails 1 through 5 bilaterally.  Follow-up with him in 3 months or so. ?

## 2021-03-24 ENCOUNTER — Encounter: Payer: Self-pay | Admitting: Physician Assistant

## 2021-03-24 ENCOUNTER — Other Ambulatory Visit: Payer: Self-pay | Admitting: Family Medicine

## 2021-03-24 MED ORDER — FUROSEMIDE 20 MG PO TABS
20.0000 mg | ORAL_TABLET | ORAL | 0 refills | Status: DC
  Filled 2021-03-24: qty 12, 28d supply, fill #0

## 2021-03-24 MED ORDER — BUDESONIDE-FORMOTEROL FUMARATE 160-4.5 MCG/ACT IN AERO
2.0000 | INHALATION_SPRAY | Freq: Two times a day (BID) | RESPIRATORY_TRACT | 12 refills | Status: DC
  Filled 2021-03-24: qty 10.2, 30d supply, fill #0

## 2021-03-24 NOTE — Progress Notes (Addendum)
51 yo with hx obesity, HTN, diabetes, tobacco abuse, chronic back pain and multiple other medical problems presenting today with persistent conjunctivitis.  Also weight gain and persistent dyspnea on exertion.  ? ?Vitals BP 102/60, 96% RA, HR 102 ?NAD ?Coarse breath sounds bilaterally with some scattered wheezing superiorly ?Mildly tachy, regular rate/rhythm ?S/nt/nd ?Bilateral LE edema  ? ?A/P ?Starting low dose lasix for edema/weight gain.  Stop amlodipine. ?He saw ophtho recently, unclear what they thought about his R eye, will try to call them. ?Issues with disability, we're working on getting him in touch with new lawyers ? ?[name], MD ? ?------------ ?Pt seen by Dr Lowell Guitar.  ? ?His eye still bothers him, itching and burning. It is crusty in the morning. Dr Delford Field felt sx were from the CPAP mask leaking and irritating his eye. Gel eye "drops" recommended at bedtime, multiple brands available, Systaine, Refresh are options.  ? ?Pt wt 378.6, previously 372 lbs, 360 lbs befor hospital d/c.Breathing is about the same but he gets SOB walking 40 ft or less, has 2+ LE edema. Will stop the amlodipine, start Lasix 20 mg M-W-F. K+ 4.6, so don't think need to start Kdur right now. BMET in 2-3 weeks.   ? ?BP borderline at 102/60, HR 102, 96%, lungs w/ coarse breath sounds with some wheezing. Increase Symbicort  ? ?Saw the foot doctor yesterday, toenails are trimmed, other foot issues improved. ? ?He still has problems w/ a lot of aching and back pain. The option of increasing his bedtime dose of gabapentin 300 >> 600 mg but leaving the other doses the same was discussed, he prefers to hold off.  ? ?[name], PA-C ?03/24/2021 ?3:02 PM ? ? ? ? ? ? ? ? ? ? ? ? ? ? ? ? ? ?

## 2021-03-24 NOTE — Progress Notes (Signed)
Patient ? ?Established Patient Office Visit ? ?Subjective:  ?Patient ID: Raymond Hood, male    DOB: 1970-12-27  Age: 51 y.o. MRN: [id number] ? ?CC:  ?Chief Complaint  ?Patient presents with  ? Medication Refill  ? ? ?HPI ?01/25/21 ?Transition of care visit ?Raymond Hood presents for follow-up after hospitalization for COPD exacerbation. He was discharged on 01/19/21. Since then, he had a transition of care follow-up call with Raymond Lathe, RN on 01/20/21, and was seen at the shelter by Dr. Joya Hood on 01/20/21.  ? ?He reports that he is feeling better since then, but that he still has some trouble taking deep breaths, dyspnea on exertion, and some coughing. He denies any rhinorrhea or congestion. He reports some feelings of lightheadedness upon standing or sitting quickly after activity. His initial SpO2 reading was at 84% after walking from waiting room to exam room. After rest, it measured 96%.  ? ?The patient states that he is using all the medications he was discharged with as directed, including the oral steroid taper. He says he has not been able to use the BiPAP machine that he was given, because he does not have a bedside table and there is not room for it in his bed at the shelter. He still admits to smoking cigarettes daily.  ? ?The patient says he has been experiencing symptoms of increased urinary frequency. He denies dysuria and hematuria. He states that his sleep has been greatly disturbed because he awakes up to 5 times at night to urinate, just as he feels like he is getting good sleep. He is very fatigued as a result of this. He tries to take naps when he can, but this is not always possible at the shelter due to rules in place there. He reports that he has been experiencing greater hunger and thirst recently. Of note, a POC blood sugar was done today, that measured 199 mg/dL. His A1C at today's visit was 7.7%. ? ?The patient also admits to foot pain, mainly in his ankles. He experiences some feelings of  numbness in his feet, especially on the soles. He states that he has had the pain and numbness for a long time, and that it does not seem to be getting better or worse. ? ?The patient states that his mood remains depressed and his anxiety high. He experiences thoughts of hurting himself, but he has no plans to do so. He has not had a chance yet to follow up with mental health.  ? ?The following is the discharge summary from the patient's hospital course on 01/13/2021: ? ?Admit date:     01/13/2021  ?Discharge date: 01/19/21  ?Discharge Physician: Raymond Hood  ?  ?PCP: Raymond Stain, MD  ?  ?Recommendations at discharge:  ?  ?Follow-up COPD ?Follow-up schizoaffective disorder ?Diastolic CHF, see below ?  ?Discharge Diagnoses ?Active Problems: ?  COPD with acute exacerbation ([place]) ?  Schizo affective schizophrenia ([place]) ?  Homeless ?  Type 2 diabetes mellitus with hyperglycemia (HCC) ?  OSA (obstructive sleep apnea) ?  Acute on chronic diastolic CHF (congestive heart failure) ([place]) ?  Hypertension associated with diabetes ([place]) ?  Morbid obesity with BMI of 50.0-59.9, adult ([place]) ?  Obesity hypoventilation syndrome ([place]) ?  Cigarette smoker ?  ?Principal Problem (Resolved): ?  Acute respiratory failure with hypoxia and hypercapnia (HCC) ?  ?  ?Hospital Course   ?51 year old male PMH COPD, OSA, on BiPAP, noncompliant secondary to homeless status, schizoaffective disorder bipolar type, presented  with shortness of breath and hypoxia.  Admitted for acute hypoxic respiratory failure secondary to COPD exacerbation complicated by OSA and probable OHS.  Slowly improving with standard therapy, hypoxia resolved, stable for discharge. ?  ?  ?* Acute respiratory failure with hypoxia and hypercapnia (HCC)-resolved as of 01/18/2021, (present on admission) ?-- Resolved.  COPD exacerbation resolved. ?  ?COPD with acute exacerbation ([place])- (present on admission) ?-- Resolved with standard treatment.  Discharged on oral steroids.  Continue BiPAP at night as able although the patient reports that he has not been able to use it at [facility] for many months now. ?  ?Acute on chronic diastolic CHF (congestive heart failure) (HCC) ?-- Echocardiogram October 2020 again showed normal LVEF, grade 1 diastolic dysfunction.  Significant volume overload now appears resolved.  Outpatient follow-up with PCP.  Consider diuretic in future if needed. ?  ?OSA (obstructive sleep apnea)- (present on admission) ?-- BiPAP nightly ideally ?  ?Type 2 diabetes mellitus with hyperglycemia (HCC) ?-- Hyperglycemic complicated by steroids, overall stable.  Apparently only on metformin as an outpatient.  Expect blood sugars to continue to improve as steroids are decreased. ?-- Resume metformin on discharge ?  ?Homeless ?-- Appreciate TOC, likely to return to [facility] where he has been living for several months despite the inability to use his BiPAP nightly.    ?  ?Schizo affective schizophrenia ([place])- (present on admission) ?-- Patient not yet established with psychiatry in the area.  Continue Resporal and duloxetine as recommended by inpatient psychiatry consultation.  Outpatient establishment with psychiatric care recommended. ?  ?Obesity hypoventilation syndrome ([place]) ?-- Suspected diagnosis based on body habitus.  Weight loss as an outpatient recommended. ?  ?Hypertension associated with diabetes ([place])- (present on admission) ?-- Appears stable, continue amlodipine. ?  ?Cigarette smoker- (present on admission) ?-- Recommend cessation ?  ? ?03/24/21 ?Patient returns in follow-up complains of some right eye irritation that persist and increased edema in the lower extremity and also increased low back pain.  Patient with hypertension diabetes COPD severe sleep apnea.  He still smoking half pack a day of cigarettes.  On arrival blood pressure is good 110/76 blood sugar 208.  The patient was seen in the shelter clinic yesterday and his amlodipine was stopped  because of lower extremity edema which is worsened.  He was placed on Lasix twice to 3 times weekly and this prescription is being filled at this time.  He is on some topical eyedrops without much improvement. ?Patient continues to score high on his suicide screen however does not have specific plans to end his life he is on the respite all ?Past Medical History:  ?Diagnosis Date  ? Bipolar 1 disorder, depressed ([place])   ? COPD (chronic obstructive pulmonary disease) ([place])   ? Depression with anxiety   ? Diabetes mellitus type II, non insulin dependent ([place]) 11/2020  ? A1c 7.3  ? Fall at home, initial encounter 05/28/2020  ? Last Assessment & Plan:  Formatting of this note might be different from the original. Acute onset, occurred few days ago.  Reports having a mechanical fall, fell forward injuring his left side including arm, knee.  Denies any vision changes, head trauma or loss of consciousness.  Patient is being referred to the emergency room for lower GI bleed, can also get evaluated for his injuries.  ? Gastric ulcer   ? when on NSAIDs  ? GERD (gastroesophageal reflux disease)   ? Helicobacter pylori antibody positive 07/04/2018  ?  Formatting of this note might be different from the original. Started triple therapy 06/30/18.   Needs HP stool antigen in 8 weeks,  Off PPI for 10 days.  ? HTN (hypertension)   ? OSA (obstructive sleep apnea)   ? not currently on CPAP  ? Rheumatoid arthritis ([place])   ? Right kidney mass, 2.8 cm 11/2020  ? Tobacco abuse   ? ? ?Past Surgical History:  ?Procedure Laterality Date  ? left index finger surgery Left   ? ? ?Family History  ?Problem Relation Age of Onset  ? Schizophrenia Mother   ? Diabetes Mellitus II Father   ? ? ?Social History  ? ?Socioeconomic History  ? Marital status: Single  ?  Spouse name: Not on file  ? Number of children: Not on file  ? Years of education: Not on file  ? Highest education level: Not on file  ?Occupational History  ? Not on file  ?Tobacco Use  ?  Smoking status: Every Day  ?  Packs/day: 1.00  ?  Types: Cigarettes  ? Smokeless tobacco: Never  ?Vaping Use  ? Vaping Use: Never used  ?Substance and Sexual Activity  ? Alcohol use: Not Currently  ? Drug [place]

## 2021-03-25 ENCOUNTER — Other Ambulatory Visit: Payer: Self-pay

## 2021-03-25 ENCOUNTER — Encounter: Payer: Self-pay | Admitting: Critical Care Medicine

## 2021-03-25 ENCOUNTER — Ambulatory Visit
Admission: RE | Admit: 2021-03-25 | Discharge: 2021-03-25 | Disposition: A | Discharge status: Home / Self Care | Payer: Medicaid Other | Source: Ambulatory Visit | Attending: Critical Care Medicine | Admitting: Critical Care Medicine

## 2021-03-25 ENCOUNTER — Ambulatory Visit: Payer: Self-pay | Attending: Critical Care Medicine | Admitting: Critical Care Medicine

## 2021-03-25 ENCOUNTER — Other Ambulatory Visit: Payer: Self-pay | Admitting: *Deleted

## 2021-03-25 ENCOUNTER — Other Ambulatory Visit (HOSPITAL_COMMUNITY): Payer: Self-pay

## 2021-03-25 VITALS — BP 110/76 | HR 108 | Wt 353.4 lb

## 2021-03-25 DIAGNOSIS — Z760 Encounter for issue of repeat prescription: Secondary | ICD-10-CM | POA: Insufficient documentation

## 2021-03-25 DIAGNOSIS — F419 Anxiety disorder, unspecified: Secondary | ICD-10-CM | POA: Insufficient documentation

## 2021-03-25 DIAGNOSIS — R42 Dizziness and giddiness: Secondary | ICD-10-CM | POA: Insufficient documentation

## 2021-03-25 DIAGNOSIS — I110 Hypertensive heart disease with heart failure: Secondary | ICD-10-CM | POA: Insufficient documentation

## 2021-03-25 DIAGNOSIS — J984 Other disorders of lung: Secondary | ICD-10-CM | POA: Insufficient documentation

## 2021-03-25 DIAGNOSIS — Z5901 Sheltered homelessness: Secondary | ICD-10-CM | POA: Insufficient documentation

## 2021-03-25 DIAGNOSIS — N2889 Other specified disorders of kidney and ureter: Secondary | ICD-10-CM | POA: Insufficient documentation

## 2021-03-25 DIAGNOSIS — F332 Major depressive disorder, recurrent severe without psychotic features: Secondary | ICD-10-CM

## 2021-03-25 DIAGNOSIS — R053 Chronic cough: Secondary | ICD-10-CM | POA: Insufficient documentation

## 2021-03-25 DIAGNOSIS — G8929 Other chronic pain: Secondary | ICD-10-CM | POA: Insufficient documentation

## 2021-03-25 DIAGNOSIS — I5033 Acute on chronic diastolic (congestive) heart failure: Secondary | ICD-10-CM | POA: Insufficient documentation

## 2021-03-25 DIAGNOSIS — E1159 Type 2 diabetes mellitus with other circulatory complications: Secondary | ICD-10-CM | POA: Insufficient documentation

## 2021-03-25 DIAGNOSIS — Z713 Dietary counseling and surveillance: Secondary | ICD-10-CM | POA: Insufficient documentation

## 2021-03-25 DIAGNOSIS — E662 Morbid (severe) obesity with alveolar hypoventilation: Secondary | ICD-10-CM | POA: Insufficient documentation

## 2021-03-25 DIAGNOSIS — Z716 Tobacco abuse counseling: Secondary | ICD-10-CM | POA: Insufficient documentation

## 2021-03-25 DIAGNOSIS — Z7952 Long term (current) use of systemic steroids: Secondary | ICD-10-CM | POA: Insufficient documentation

## 2021-03-25 DIAGNOSIS — D751 Secondary polycythemia: Secondary | ICD-10-CM | POA: Insufficient documentation

## 2021-03-25 DIAGNOSIS — M545 Low back pain, unspecified: Secondary | ICD-10-CM | POA: Insufficient documentation

## 2021-03-25 DIAGNOSIS — Z7951 Long term (current) use of inhaled steroids: Secondary | ICD-10-CM | POA: Insufficient documentation

## 2021-03-25 DIAGNOSIS — J449 Chronic obstructive pulmonary disease, unspecified: Secondary | ICD-10-CM

## 2021-03-25 DIAGNOSIS — L600 Ingrowing nail: Secondary | ICD-10-CM | POA: Insufficient documentation

## 2021-03-25 DIAGNOSIS — F250 Schizoaffective disorder, bipolar type: Secondary | ICD-10-CM | POA: Insufficient documentation

## 2021-03-25 DIAGNOSIS — G4733 Obstructive sleep apnea (adult) (pediatric): Secondary | ICD-10-CM

## 2021-03-25 DIAGNOSIS — Z6841 Body Mass Index (BMI) 40.0 and over, adult: Secondary | ICD-10-CM | POA: Insufficient documentation

## 2021-03-25 DIAGNOSIS — F339 Major depressive disorder, recurrent, unspecified: Secondary | ICD-10-CM | POA: Insufficient documentation

## 2021-03-25 DIAGNOSIS — F17210 Nicotine dependence, cigarettes, uncomplicated: Secondary | ICD-10-CM | POA: Insufficient documentation

## 2021-03-25 DIAGNOSIS — Z79899 Other long term (current) drug therapy: Secondary | ICD-10-CM | POA: Insufficient documentation

## 2021-03-25 DIAGNOSIS — I5032 Chronic diastolic (congestive) heart failure: Secondary | ICD-10-CM

## 2021-03-25 DIAGNOSIS — M79673 Pain in unspecified foot: Secondary | ICD-10-CM | POA: Insufficient documentation

## 2021-03-25 DIAGNOSIS — E1165 Type 2 diabetes mellitus with hyperglycemia: Secondary | ICD-10-CM | POA: Insufficient documentation

## 2021-03-25 DIAGNOSIS — H5789 Other specified disorders of eye and adnexa: Secondary | ICD-10-CM | POA: Insufficient documentation

## 2021-03-25 DIAGNOSIS — Z09 Encounter for follow-up examination after completed treatment for conditions other than malignant neoplasm: Secondary | ICD-10-CM | POA: Insufficient documentation

## 2021-03-25 DIAGNOSIS — R200 Anesthesia of skin: Secondary | ICD-10-CM | POA: Insufficient documentation

## 2021-03-25 DIAGNOSIS — I152 Hypertension secondary to endocrine disorders: Secondary | ICD-10-CM

## 2021-03-25 LAB — GLUCOSE, POCT (MANUAL RESULT ENTRY): POC Glucose: 208 mg/dl — AB (ref 70–99)

## 2021-03-25 MED ORDER — CIPROFLOXACIN HCL 0.3 % OP SOLN
1.0000 [drp] | OPHTHALMIC | 0 refills | Status: DC
  Filled 2021-03-25: qty 5, 8d supply, fill #0

## 2021-03-25 MED ORDER — CIPROFLOXACIN HCL 0.3 % OP OINT
TOPICAL_OINTMENT | Freq: Two times a day (BID) | OPHTHALMIC | 0 refills | Status: DC
  Filled 2021-03-25: qty 3.5, fill #0

## 2021-03-25 MED ORDER — OLOPATADINE HCL 0.1 % OP SOLN
1.0000 [drp] | Freq: Two times a day (BID) | OPHTHALMIC | 12 refills | Status: DC
  Filled 2021-03-25: qty 5, 30d supply, fill #0

## 2021-03-25 NOTE — Assessment & Plan Note (Signed)
Blood pressure controlled at this visit no changes made we will continue furosemide as prescribed hold amlodipine ?

## 2021-03-25 NOTE — Assessment & Plan Note (Signed)
? ? ??   Current smoking consumption amount: 1PPD or more  Dicsussion on advise to quit smoking and smoking impacts: CV lung impacts  Patient's willingness to quit:  Not ready to quit completely  Methods to quit smoking discussed:  Behavioral mod and nicotine replacemtn  No prescribed  Medication management of smoking session drugs discussed: no meds prescribed  Setting quit date not established   Follow-up arranged  Weekly at shelter clinic   Time spent counseling the patient:  5min   

## 2021-03-25 NOTE — Assessment & Plan Note (Signed)
Continue oral Farxiga and metformin blood sugar remains elevated ?

## 2021-03-25 NOTE — Assessment & Plan Note (Signed)
Will need subsequent scanning once he obtains insurance for now we will hold off on scanning and observe ?

## 2021-03-25 NOTE — Assessment & Plan Note (Signed)
Chronic low back pain we will reimage lower back today ?

## 2021-03-25 NOTE — Assessment & Plan Note (Signed)
Heart failure stable at this time continue furosemide and blood pressure control ?

## 2021-03-25 NOTE — Assessment & Plan Note (Signed)
Recently seen by podiatry all toenails cleaned and improved ?

## 2021-03-25 NOTE — Patient Instructions (Addendum)
Stop current eye drops ? ?Start new eye drops:  cipro eye gel twice daily and patanol eye drops twice daily to RIGHT EYE ? ?Stop amlodipine ? ?Start furosemide one pill three times a week ? ?No other medication changes ? ?Labs today metabolic panel and blood counts ? ?Get back xrays today in xray downstairs before you leave ? ?Return Dr Joya Gaskins 2 months, we will continue to see you weekly at [facility] clinic as well ? ? ?

## 2021-03-25 NOTE — Assessment & Plan Note (Signed)
He has a relatively new mask he does have a slight leak on the right side of the mask otherwise no changes made ?

## 2021-03-25 NOTE — Assessment & Plan Note (Signed)
Still actively smoking ? ?Continue inhaled medications ?

## 2021-03-25 NOTE — Assessment & Plan Note (Signed)
Has follow-up with mental health 

## 2021-03-26 LAB — COMPREHENSIVE METABOLIC PANEL
ALT: 13 IU/L (ref 0–44)
AST: 11 IU/L (ref 0–40)
Albumin/Globulin Ratio: 1.5 (ref 1.2–2.2)
Albumin: 4 g/dL (ref 4.0–5.0)
Alkaline Phosphatase: 101 IU/L (ref 44–121)
BUN/Creatinine Ratio: 17 (ref 9–20)
BUN: 14 mg/dL (ref 6–24)
Bilirubin Total: 0.2 mg/dL (ref 0.0–1.2)
CO2: 26 mmol/L (ref 20–29)
Calcium: 8.8 mg/dL (ref 8.7–10.2)
Chloride: 100 mmol/L (ref 96–106)
Creatinine, Ser: 0.82 mg/dL (ref 0.76–1.27)
Globulin, Total: 2.6 g/dL (ref 1.5–4.5)
Glucose: 152 mg/dL — ABNORMAL HIGH (ref 70–99)
Potassium: 4.5 mmol/L (ref 3.5–5.2)
Sodium: 142 mmol/L (ref 134–144)
Total Protein: 6.6 g/dL (ref 6.0–8.5)
eGFR: 108 mL/min/{1.73_m2} (ref >59)

## 2021-03-26 LAB — CBC WITH DIFFERENTIAL/PLATELET
Basophils Absolute: 0.1 10*3/uL (ref 0.0–0.2)
Basos: 1 %
EOS (ABSOLUTE): 0.3 10*3/uL (ref 0.0–0.4)
Eos: 2 %
Hematocrit: 48.3 % (ref 37.5–51.0)
Hemoglobin: 16.1 g/dL (ref 13.0–17.7)
Immature Grans (Abs): 0.1 10*3/uL (ref 0.0–0.1)
Immature Granulocytes: 1 %
Lymphocytes Absolute: 3 10*3/uL (ref 0.7–3.1)
Lymphs: 25 %
MCH: 28.5 pg (ref 26.6–33.0)
MCHC: 33.3 g/dL (ref 31.5–35.7)
MCV: 86 fL (ref 79–97)
Monocytes Absolute: 0.8 10*3/uL (ref 0.1–0.9)
Monocytes: 7 %
Neutrophils Absolute: 7.7 10*3/uL — ABNORMAL HIGH (ref 1.4–7.0)
Neutrophils: 64 %
Platelets: 253 10*3/uL (ref 150–450)
RBC: 5.65 x10E6/uL (ref 4.14–5.80)
RDW: 13.8 % (ref 11.6–15.4)
WBC: 11.9 10*3/uL — ABNORMAL HIGH (ref 3.4–10.8)

## 2021-03-29 ENCOUNTER — Telehealth: Payer: Self-pay

## 2021-03-29 NOTE — Telephone Encounter (Signed)
-----   Message from Storm Frisk, MD sent at 03/29/2021  5:51 AM EDT ----- ?Call pt tell him xray shows arthritis in lower back , we will address this at clinic at shelter this wednesday ?

## 2021-03-29 NOTE — Telephone Encounter (Signed)
Pt was called and is aware of results, DOB was confirmed.  ?

## 2021-03-31 ENCOUNTER — Encounter: Payer: Self-pay | Admitting: Physician Assistant

## 2021-03-31 NOTE — Progress Notes (Signed)
Pt seen by Dr Delford Field. ? ?His labs and Xray were reviewed, results below. ? ?Weight, 379 lbs ? ?His R eye is still red, he is using the drops as directed. He is on Cipro otic soln and Patanol drop daily, compliant with these.  ? ?He is compliant w/ BiPAP qhs, feels that it is drying his tear ducts out.  ? ?Is smoking 1/2 ppd/day. ? ?Coughing up phlegm, no fevers, no signs of infection.  ? ?CT abd results reviewed. Holding off on further testing for now. ? ?Lab Results  ?Component Value Date  ? WBC 11.9 (H) 03/25/2021  ? HGB 16.1 03/25/2021  ? HCT 48.3 03/25/2021  ? MCV 86 03/25/2021  ? PLT 253 03/25/2021  ? ?No results for input(s): INR in the last 72 hours.  ?Recent Labs  ?Lab 03/25/21 ?1114  ?NA 142  ?K 4.5  ?CL 100  ?CO2 26  ?BUN 14  ?CREATININE 0.82  ?CALCIUM 8.8  ?PROT 6.6  ?BILITOT 0.2  ?ALKPHOS 101  ?ALT 13  ?AST 11  ?GLUCOSE 152*  ?   ? ?Lab Results  ?Component Value Date  ? CHOL 186 01/25/2021  ? HDL 42 01/25/2021  ? LDLCALC 99 01/25/2021  ? TRIG 268 (H) 01/25/2021  ? CHOLHDL 4.4 01/25/2021  ? ?No results found for: TSH ?Lab Results  ?Component Value Date  ? HGBA1C 7.7 (A) 01/25/2021  ? ? ?DG Lumbar Spine Complete ? ?Result Date: 03/27/2021 ?CLINICAL DATA:  Low back pain with radiation to the legs EXAM: LUMBAR SPINE - COMPLETE 4+ VIEW COMPARISON:  11/16/2020 CT reconstructions FINDINGS: Normal alignment. Lower thoracic and upper lumbar mild degenerative changes at multiple levels with disc space narrowing, endplate sclerosis and bony spurring. These changes are demonstrated from T9 through L2. Preserved vertebral body heights. No acute compression fracture. No wedge-shaped deformity. Facets are aligned. No pars defects. Normal appearing pedicles. Chronic appearing similar sclerosis of the SI joints bilaterally compatible with sacroiliitis. Included pelvis intact.  Nonobstructive bowel gas pattern. IMPRESSION: Lower thoracic and lumbar degenerative changes as above. Bilateral SI joint arthropathy/sacroiliitis.  No acute finding by plain radiography Electronically Signed   By: Judie Petit.  Shick M.D.   On: 03/27/2021 16:11   ? ?[name], PA-C ?03/31/2021 ?2:40 PM ? ?

## 2021-04-01 ENCOUNTER — Other Ambulatory Visit: Payer: Self-pay | Admitting: *Deleted

## 2021-04-07 ENCOUNTER — Encounter: Payer: Self-pay | Admitting: Physician Assistant

## 2021-04-07 NOTE — Progress Notes (Signed)
Pt seen by Dr Delford Field. ? ?Still smoking 1 ppd ? ?R eye much better, not red at all.  ? ?+cough, non-productive. Has been hearing himself wheeze.  ? ?Wt 379.4  11/70, 95  96% ? ?Lungs w/ exp wheeze ? ?No acute issues except the wheezing,  ? ?No med changes. ? ?[name], PA-C ?04/07/2021 ?3:35 PM ? ? ? ? ? ?

## 2021-04-08 ENCOUNTER — Other Ambulatory Visit: Payer: Self-pay | Admitting: *Deleted

## 2021-04-08 MED ORDER — TAMSULOSIN HCL 0.4 MG PO CAPS
0.4000 mg | ORAL_CAPSULE | Freq: Every day | ORAL | 3 refills | Status: DC
Start: 2021-04-08 — End: 2021-05-06
  Filled 2021-04-08: qty 30, 30d supply, fill #0

## 2021-04-08 MED ORDER — RISPERIDONE 4 MG PO TABS
4.0000 mg | ORAL_TABLET | Freq: Every day | ORAL | 0 refills | Status: DC
  Filled 2021-04-08: qty 30, 30d supply, fill #0

## 2021-04-08 MED ORDER — GABAPENTIN 300 MG PO CAPS
300.0000 mg | ORAL_CAPSULE | Freq: Three times a day (TID) | ORAL | 0 refills | Status: DC
  Filled 2021-04-08: qty 90, 30d supply, fill #0

## 2021-04-08 MED ORDER — METFORMIN HCL 500 MG PO TABS
1000.0000 mg | ORAL_TABLET | Freq: Two times a day (BID) | ORAL | 0 refills | Status: DC
  Filled 2021-04-08: qty 120, 30d supply, fill #0

## 2021-04-09 ENCOUNTER — Other Ambulatory Visit (HOSPITAL_COMMUNITY): Payer: Self-pay

## 2021-04-14 ENCOUNTER — Other Ambulatory Visit (HOSPITAL_COMMUNITY): Payer: Self-pay

## 2021-04-14 ENCOUNTER — Other Ambulatory Visit: Payer: Self-pay | Admitting: *Deleted

## 2021-04-14 ENCOUNTER — Encounter: Payer: Self-pay | Admitting: Physician Assistant

## 2021-04-14 MED ORDER — GABAPENTIN 300 MG PO CAPS
ORAL_CAPSULE | ORAL | 0 refills | Status: DC
  Filled 2021-04-14: qty 150, 30d supply, fill #0

## 2021-04-14 NOTE — Progress Notes (Addendum)
Pt c/o increasing back pain.  ? ?The area of pain is larger than it used to be.  ? ?The pain is continuous, no different in am or pm.  ? ?The gabapentin helps the pain, lido patches don't do much. When he can relax, it seems to work better. He takes Tylenol with the gabapentin, has plenty of that.  ? ?Will increase the gabapentin to 600 mg bid and 300 mg at bedtime. See if this helps. Difficult situation, explained that back specialists do not work Advice workerpro bono.  ? ?Back pain is 8/10 now, gets to 10/10. Hurts the most when he has to get up out of a chair, or has to bend or stretch.  ? ?The diapers are a little tight. [name] will get a larger size.  ? ?He has nocturia, no burning or fevers.  ? ?Pt has LE edema, puffiness in hands as well. Denies any changes in breathing. Wt stable.  ?Would benefit from increased Lasix for a few days, but will hold off because of urinary issues  ?Wt Readings from Last 3 Encounters:  ?04/14/21 (!) 379 lb 3.2 oz (172 kg)  ?04/07/21 (!) 379 lb 6.4 oz (172.1 kg)  ?03/31/21 (!) 379 lb (171.9 kg)  ? ?R eye has some drainage, whitish. No burning or pain. Compliant w/ tid drops, continue these.  ? ? ?Raymond Demarkhonda Simeon Vera, PA-C ?04/14/2021 ?3:57 PM ? ? ? ?

## 2021-04-15 ENCOUNTER — Other Ambulatory Visit: Payer: Self-pay | Admitting: *Deleted

## 2021-04-15 ENCOUNTER — Other Ambulatory Visit (HOSPITAL_COMMUNITY): Payer: Self-pay

## 2021-04-15 MED ORDER — DULOXETINE HCL 60 MG PO CPEP
60.0000 mg | ORAL_CAPSULE | Freq: Every day | ORAL | 3 refills | Status: DC
Start: 2021-04-15 — End: 2021-05-20
  Filled 2021-04-15: qty 30, 30d supply, fill #0

## 2021-04-15 MED ORDER — FUROSEMIDE 20 MG PO TABS
20.0000 mg | ORAL_TABLET | ORAL | 0 refills | Status: DC
  Filled 2021-04-15 (×2): qty 12, 28d supply, fill #0

## 2021-04-21 ENCOUNTER — Encounter: Payer: Self-pay | Admitting: Physician Assistant

## 2021-04-21 NOTE — Progress Notes (Addendum)
Pt says doing ok. ? ?Says he hears himself wheeze. He is wheezing a little worse today. Has fewer problems in cold weather. The warmer weather is bothering him. ? ?Compliant w/ meds, including Symbicort, occ uses albuterol, last time was yesterday.  ? ?BP 120/67, HR 105. Heart RRR, Lungs w/ exp wheeze, +LE edema, but unchanged. ? ?Wt up 1 lb, had hot dogs and pizza on Easter. ? ?Wt Readings from Last 3 Encounters:  ?04/21/21 (!) 380 lb 9.6 oz (172.6 kg)  ?04/14/21 (!) 379 lb 3.2 oz (172 kg)  ?04/07/21 (!) 379 lb 6.4 oz (172.1 kg)  ? ?The lawyer got in touch with him. They accepted his case. They are mailing him forms to fill out.  ? ?Still having problems w/ nocturia, when he wakes up, he has to go immediately. Explained that it may come from fluid in his legs coming out when he elevates them. He feels the fluid is coming out because he is relaxed.  ? ?He does not have a glucometer, does not check sugars. CBG 198.  ? ?He does not drink very much water. He is encouraged to drink for thirst.  ? ?Plan to order Farxiga via [facility] pharmacy for access to care free. ? ?[name], PA-C ?04/21/2021 ?3:19 PM ? ? ? ?Luisa Hart WrightMD ? ? ? ? ? ? ? ? ? ?

## 2021-04-22 ENCOUNTER — Other Ambulatory Visit (HOSPITAL_COMMUNITY): Payer: Self-pay

## 2021-04-22 ENCOUNTER — Other Ambulatory Visit: Payer: Self-pay | Admitting: *Deleted

## 2021-04-22 MED ORDER — HYDROXYZINE HCL 25 MG PO TABS
25.0000 mg | ORAL_TABLET | Freq: Three times a day (TID) | ORAL | 2 refills | Status: DC | PRN
Start: 2021-04-22 — End: 2021-05-27
  Filled 2021-04-22: qty 90, 30d supply, fill #0

## 2021-04-22 MED ORDER — ATORVASTATIN CALCIUM 10 MG PO TABS
10.0000 mg | ORAL_TABLET | Freq: Every day | ORAL | 3 refills | Status: DC
  Filled 2021-04-22: qty 30, 30d supply, fill #0

## 2021-04-22 MED ORDER — DAPAGLIFLOZIN PROPANEDIOL 10 MG PO TABS
10.0000 mg | ORAL_TABLET | Freq: Every day | ORAL | 3 refills | Status: DC
Start: 2021-04-22 — End: 2021-04-24
  Filled 2021-04-22 – 2021-04-23 (×2): qty 30, 30d supply, fill #0

## 2021-04-23 ENCOUNTER — Other Ambulatory Visit (HOSPITAL_COMMUNITY): Payer: Self-pay

## 2021-04-24 ENCOUNTER — Other Ambulatory Visit: Payer: Self-pay | Admitting: Critical Care Medicine

## 2021-04-24 MED ORDER — DAPAGLIFLOZIN PROPANEDIOL 10 MG PO TABS
10.0000 mg | ORAL_TABLET | Freq: Every day | ORAL | 3 refills | Status: DC
Start: 2021-04-24 — End: 2021-05-20
  Filled 2021-04-24: qty 30, 30d supply, fill #0

## 2021-04-26 ENCOUNTER — Other Ambulatory Visit: Payer: Self-pay

## 2021-04-28 ENCOUNTER — Other Ambulatory Visit: Payer: Self-pay

## 2021-04-28 ENCOUNTER — Encounter: Payer: Self-pay | Admitting: Physician Assistant

## 2021-04-28 NOTE — Progress Notes (Addendum)
Pt seen by Dr Delford Field.  ? ?He was given 2 t-shirts and 2 pr shorts, the sweatshirt he was given has been shrinking in the wash, it is too small now.  ? ?He is urinating a lot at night. No burning or blood in urine. When he wakes up at night, he does not have time to go to the BR before he starts urinating.  ? ?He has been in touch with the attorney and is sending them stuff, Lance Muss will talk to Les Pou and Lorin Picket to see where  ? ?Wt 374.4 lbs ? ?Wt Readings from Last 3 Encounters:  ?04/28/21 (!) 374 lb 6.4 oz (169.8 kg)  ?04/21/21 (!) 380 lb 9.6 oz (172.6 kg)  ?04/14/21 (!) 379 lb 3.2 oz (172 kg)  ? ?133/88, 97, 94% ? ?Still smoking. Has distinct sock line on his legs >> given diabetic socks.  ? ?He is out of [place], Rx has been sent in for assistance to [facility], not filled yet.  ? ?[name], PA-C ?04/28/2021 ?3:00 PM ? ? ? ?Marcelline Deist ready [name] to pickup ? ? ? ?

## 2021-04-29 ENCOUNTER — Other Ambulatory Visit: Payer: Self-pay | Admitting: *Deleted

## 2021-04-29 ENCOUNTER — Other Ambulatory Visit (HOSPITAL_COMMUNITY): Payer: Self-pay

## 2021-04-29 ENCOUNTER — Other Ambulatory Visit: Payer: Self-pay

## 2021-04-29 MED ORDER — PRESERVISION AREDS 2 PO CAPS
1.0000 | ORAL_CAPSULE | Freq: Two times a day (BID) | ORAL | 6 refills | Status: DC
  Filled 2021-04-29: qty 120, 60d supply, fill #0

## 2021-04-30 ENCOUNTER — Other Ambulatory Visit (HOSPITAL_COMMUNITY): Payer: Self-pay

## 2021-05-04 ENCOUNTER — Other Ambulatory Visit (HOSPITAL_COMMUNITY): Payer: Self-pay

## 2021-05-05 ENCOUNTER — Encounter: Payer: Self-pay | Admitting: Physician Assistant

## 2021-05-05 ENCOUNTER — Other Ambulatory Visit: Payer: Self-pay | Admitting: Family Medicine

## 2021-05-05 MED ORDER — FUROSEMIDE 20 MG PO TABS
40.0000 mg | ORAL_TABLET | ORAL | 0 refills | Status: DC
  Filled 2021-05-05: qty 24, 28d supply, fill #0

## 2021-05-05 NOTE — Progress Notes (Addendum)
Seen at [facility] ?51 yo with hx obesity, HTN, diabetes, tobacco abuse, chronic back pain and multiple other medical problems here for follow up.  Denied disability.  We're working on having him see Hoyle Sauer.  Weights up, swelling more, feels heavier. ? ?VS BP 149/86, HR 90's ?NAD ?Unlabored ?Bilateral LE edema ? ?AP ?Increase lasix to 40 three times weekly for swelling and weight gain.  He'll need follow up labs soon. ?Continue to work on disability ? ?[name], MD ? ?----------------- ? ?Pt seen by Dr [name].  ? ?He was denied disability. The rejection letter mentions that NO records were received from [facility] or [facility].  ?  ?Rockney Ghee, [employer] and [employer] were the attorneys. ? ?Pt has spoken to someone at [facility] for an initial interview, not sure who. [name] will contact them and see what we can do.  ? ?He is otherwise doing ok, still urinating a lot when he takes the Lasix. Cannot get to the bathroom at night before he wets, does better during the day. [name] is looking for overnight depends or similar, they are on backorder. Not troubled by cramping. He feels heavier today, still has LE edema and swelling in his hands.  ? ?He prefers to take a higher dose of Lasix 3 days a week, rather than take it every day.  Increase Lasix to 40 mg M-W-F. Ck BMET when he sees Dr Joya Gaskins in May.  ? ?He does not use his inhaler often, every few days when he feels like he needs it.  ? ?He feels that he gets a lot of salt in the food he eats. He also likes sweets  He was asked to limit sweets.  ? ?Today 385.8  ?Wt Readings from Last 3 Encounters:  ?04/28/21 (!) 374 lb 6.4 oz (169.8 kg)  ?04/21/21 (!) 380 lb 9.6 oz (172.6 kg)  ?04/14/21 (!) 379 lb 3.2 oz (172 kg)  ?  ?BP Readings from Last 3 Encounters:  ?05/05/21 (!) 149/86  ?04/28/21 133/88  ?04/21/21 120/67  ? ? ? ? ? ? ? ? ? ? ?

## 2021-05-06 ENCOUNTER — Other Ambulatory Visit (HOSPITAL_COMMUNITY): Payer: Self-pay

## 2021-05-06 ENCOUNTER — Other Ambulatory Visit: Payer: Self-pay | Admitting: *Deleted

## 2021-05-06 MED ORDER — GABAPENTIN 300 MG PO CAPS
ORAL_CAPSULE | ORAL | 0 refills | Status: DC
  Filled 2021-05-06 – 2021-05-13 (×2): qty 150, 30d supply, fill #0

## 2021-05-06 MED ORDER — METFORMIN HCL 500 MG PO TABS
1000.0000 mg | ORAL_TABLET | Freq: Two times a day (BID) | ORAL | 0 refills | Status: DC
Start: 2021-05-06 — End: 2021-06-03
  Filled 2021-05-06 – 2021-05-13 (×2): qty 120, 30d supply, fill #0

## 2021-05-06 MED ORDER — RISPERIDONE 4 MG PO TABS
4.0000 mg | ORAL_TABLET | Freq: Every day | ORAL | 0 refills | Status: DC
  Filled 2021-05-06: qty 30, 30d supply, fill #0

## 2021-05-06 MED ORDER — TAMSULOSIN HCL 0.4 MG PO CAPS
0.4000 mg | ORAL_CAPSULE | Freq: Every day | ORAL | 3 refills | Status: DC
  Filled 2021-05-06 – 2021-05-13 (×2): qty 30, 30d supply, fill #0

## 2021-05-07 ENCOUNTER — Other Ambulatory Visit (HOSPITAL_COMMUNITY): Payer: Self-pay

## 2021-05-12 ENCOUNTER — Encounter: Payer: Self-pay | Admitting: Physician Assistant

## 2021-05-12 ENCOUNTER — Other Ambulatory Visit (HOSPITAL_COMMUNITY): Payer: Self-pay

## 2021-05-12 ENCOUNTER — Other Ambulatory Visit: Payer: Self-pay | Admitting: Critical Care Medicine

## 2021-05-12 MED ORDER — PREDNISONE 10 MG PO TABS
40.0000 mg | ORAL_TABLET | Freq: Every day | ORAL | 0 refills | Status: DC
  Filled 2021-05-12: qty 20, 5d supply, fill #0

## 2021-05-12 MED ORDER — AZITHROMYCIN 500 MG PO TABS
500.0000 mg | ORAL_TABLET | Freq: Every day | ORAL | 0 refills | Status: DC
  Filled 2021-05-12: qty 3, 3d supply, fill #0

## 2021-05-12 NOTE — Progress Notes (Signed)
Pt seen by Dr Delford Field. ? ?Pt w/ URI sx, cough w/ yellow phlegm ?Nasal drainage is yellowish brown ?Rattles when he breathes ?Has been like this since Sunday ?No known fevers, has not checked ? ?Has not been able to use the CPAP last 2 nights because of all the congestion. ? ?Wt 382.8, 90% O2 on room air ? ?Wt Readings from Last 3 Encounters:  ?05/12/21 (!) 382 lb 12.8 oz (173.6 kg)  ?05/05/21 (!) 385 lb 12.8 oz (175 kg)  ?04/28/21 (!) 374 lb 6.4 oz (169.8 kg)  ? ?On Exam: insp and exp wheezes ?Yellow drainage on nasal exam ? ?He will be on pred 10 mg, 40 mg qd x 3 days ?Azithromycin 500 mg qd x 3 days ?He was given Mucinex po. ? ?He was strongly advised not to smoke.  ? ?[name], PA-C ?05/12/2021 ?3:05 PM ? ?

## 2021-05-13 ENCOUNTER — Other Ambulatory Visit (HOSPITAL_COMMUNITY): Payer: Self-pay

## 2021-05-19 ENCOUNTER — Other Ambulatory Visit: Payer: Self-pay | Admitting: Critical Care Medicine

## 2021-05-19 ENCOUNTER — Other Ambulatory Visit (HOSPITAL_COMMUNITY): Payer: Self-pay

## 2021-05-19 ENCOUNTER — Encounter: Payer: Self-pay | Admitting: Critical Care Medicine

## 2021-05-19 MED ORDER — CEFDINIR 300 MG PO CAPS
300.0000 mg | ORAL_CAPSULE | Freq: Two times a day (BID) | ORAL | 0 refills | Status: DC
  Filled 2021-05-19: qty 14, 7d supply, fill #0

## 2021-05-20 ENCOUNTER — Other Ambulatory Visit: Payer: Self-pay

## 2021-05-20 ENCOUNTER — Other Ambulatory Visit: Payer: Self-pay | Admitting: *Deleted

## 2021-05-20 ENCOUNTER — Other Ambulatory Visit (HOSPITAL_COMMUNITY): Payer: Self-pay

## 2021-05-20 ENCOUNTER — Encounter: Payer: Self-pay | Admitting: *Deleted

## 2021-05-20 MED ORDER — DULOXETINE HCL 60 MG PO CPEP
60.0000 mg | ORAL_CAPSULE | Freq: Every day | ORAL | 3 refills | Status: DC
  Filled 2021-05-20: qty 30, 30d supply, fill #0

## 2021-05-20 MED ORDER — FAMOTIDINE 20 MG PO TABS
20.0000 mg | ORAL_TABLET | Freq: Two times a day (BID) | ORAL | 0 refills | Status: DC
  Filled 2021-05-20: qty 60, 30d supply, fill #0

## 2021-05-20 MED ORDER — DAPAGLIFLOZIN PROPANEDIOL 5 MG PO TABS
10.0000 mg | ORAL_TABLET | Freq: Every day | ORAL | 3 refills | Status: DC
  Filled 2021-05-20: qty 30, 30d supply, fill #0
  Filled 2021-05-27: qty 60, 30d supply, fill #0

## 2021-05-20 MED ORDER — RISPERIDONE 4 MG PO TABS
4.0000 mg | ORAL_TABLET | Freq: Every day | ORAL | 0 refills | Status: DC
  Filled 2021-05-20: qty 30, 30d supply, fill #0

## 2021-05-20 MED ORDER — ATORVASTATIN CALCIUM 10 MG PO TABS
10.0000 mg | ORAL_TABLET | Freq: Every day | ORAL | 3 refills | Status: DC
  Filled 2021-05-20: qty 30, 30d supply, fill #0

## 2021-05-20 NOTE — Congregational Nurse Program (Signed)
Pt seen in clinic by dr Delford Field, hatkinson RN assist. ?Pt has audible wheezes slight improvement from 1 week ago, pearly yellow, beige sputum ?Prescribed new ABX ?RN will do pillbox Thursday 5/11 ?Pt prefers assisted living to apt living, will speak to angel ?Pt quite agitated when dr Delford Field ask him to cut back on smoking of cigarettes ? ? ? ?

## 2021-05-20 NOTE — Progress Notes (Signed)
Patient ID: Raymond Hood , male   DOB: 01/23/1970, 51 y.o.   MRN: [id number] ?This is a 51 year old male we follow at the [facility] clinic is also my primary care patient.  Patient has history of COPD, sleep apnea, reflux, hypertension, type 2 diabetes, chronic diastolic heart failure, major depression, schizoaffective schizophrenia, tobacco abuse, renal mass on the right. ? ?Patient comes in today in follow-up after having received 3 days of azithromycin 5-day course of prednisone still having difficulty with breathing still coughing up thick yellow mucus.  He is still smoking over a pack a day of cigarettes. ? ?On exam blood pressure 141/91 pulse 103 saturation 94% room air weight is 381.6 pounds ? ?Chest shows in-store expiratory wheeze coarse rhonchi poor air movement patient is morbidly obese there is 1+ edema in the ankles abdomen is protuberant cardiac exam unremarkable ? ?Impression is that of persistent tracheobronchitis we will give yet another course of antibiotics this time give cefdinir 300 mg twice daily for 7 days ? ?No change in his other medications are indicated he will stay on his inhalers and his CPAP machine ? ?I tried to engage him around reducing the tobacco use but he became very angry about this. ? ?He does have follow-up with me in [facility] clinic [facility] ?

## 2021-05-21 ENCOUNTER — Other Ambulatory Visit: Payer: Self-pay

## 2021-05-26 ENCOUNTER — Encounter: Payer: Self-pay | Admitting: Critical Care Medicine

## 2021-05-27 ENCOUNTER — Other Ambulatory Visit (HOSPITAL_COMMUNITY): Payer: Self-pay

## 2021-05-27 ENCOUNTER — Other Ambulatory Visit: Payer: Self-pay | Admitting: *Deleted

## 2021-05-27 ENCOUNTER — Other Ambulatory Visit: Payer: Self-pay

## 2021-05-27 MED ORDER — HYDROXYZINE HCL 25 MG PO TABS
25.0000 mg | ORAL_TABLET | Freq: Three times a day (TID) | ORAL | 2 refills | Status: DC | PRN
  Filled 2021-05-27: qty 90, 30d supply, fill #0

## 2021-05-27 NOTE — Progress Notes (Signed)
This is a 51 year old male was seen frequently at the [facility] and also follow him at [facility].  Patient returns today for blood pressure check and on arrival blood pressure is elevated 151/101 saturation 92% patient still coughing some mucus but is less his weight is down to 384 pounds  Chest shows a few scattered wheezes otherwise clear he has trace edema in the lower extremities  Impression is that of hypertension COPD still actively smoking  Patient to continue current inhaled medications work on smoking cessation no further antibiotics are indicated we will connect with social work to see where we stand with his disability determination

## 2021-05-28 ENCOUNTER — Other Ambulatory Visit (HOSPITAL_COMMUNITY): Payer: Self-pay

## 2021-05-31 NOTE — Progress Notes (Signed)
Patient  Established Patient Office Visit  Subjective:  Patient ID: Raymond Hood, male    DOB: 03-23-1970  Age: 51 y.o. MRN: [id number]  CC:  No chief complaint on file.   HPI 01/25/21 Transition of care visit Raymond Hood presents for follow-up after hospitalization for COPD exacerbation. He was discharged on 01/19/21. Since then, he had a transition of care follow-up call with [name], RN on 01/20/21, and was seen at the shelter by [name] on 01/20/21.   He reports that he is feeling better since then, but that he still has some trouble taking deep breaths, dyspnea on exertion, and some coughing. He denies any rhinorrhea or congestion. He reports some feelings of lightheadedness upon standing or sitting quickly after activity. His initial SpO2 reading was at 84% after walking from waiting room to exam room. After rest, it measured 96%.   The patient states that he is using all the medications he was discharged with as directed, including the oral steroid taper. He says he has not been able to use the BiPAP machine that he was given, because he does not have a bedside table and there is not room for it in his bed at the shelter. He still admits to smoking cigarettes daily.   The patient says he has been experiencing symptoms of increased urinary frequency. He denies dysuria and hematuria. He states that his sleep has been greatly disturbed because he awakes up to 5 times at night to urinate, just as he feels like he is getting good sleep. He is very fatigued as a result of this. He tries to take naps when he can, but this is not always possible at the shelter due to rules in place there. He reports that he has been experiencing greater hunger and thirst recently. Of note, a POC blood sugar was done today, that measured 199 mg/dL. His A1C at today's visit was 7.7%.  The patient also admits to foot pain, mainly in his ankles. He experiences some feelings of numbness in his feet, especially  on the soles. He states that he has had the pain and numbness for a long time, and that it does not seem to be getting better or worse.  The patient states that his mood remains depressed and his anxiety high. He experiences thoughts of hurting himself, but he has no plans to do so. He has not had a chance yet to follow up with mental health.   The following is the discharge summary from the patient's hospital course on 01/13/2021:  Admit date:     01/13/2021  Discharge date: 01/19/21  Discharge Physician: [name]    PCP: [name], MD    Recommendations at discharge:    Follow-up COPD Follow-up schizoaffective disorder Diastolic CHF, see below   Discharge Diagnoses Active Problems:   COPD with acute exacerbation ([place])   Schizo affective schizophrenia ([place])   Homeless   Type 2 diabetes mellitus with hyperglycemia (HCC)   OSA (obstructive sleep apnea)   Acute on chronic diastolic CHF (congestive heart failure) ([place])   Hypertension associated with diabetes ([place])   Morbid obesity with BMI of 50.0-59.9, adult (HCC)   Obesity hypoventilation syndrome (HCC)   Cigarette smoker   Principal Problem (Resolved):   Acute respiratory failure with hypoxia and hypercapnia [facility])     Hospital Course   51 year old male PMH COPD, OSA, on BiPAP, noncompliant secondary to homeless status, schizoaffective disorder bipolar type, presented with shortness of breath and hypoxia.  Admitted for acute hypoxic respiratory failure secondary to COPD exacerbation complicated by OSA and probable OHS.  Slowly improving with standard therapy, hypoxia resolved, stable for discharge.     * Acute respiratory failure with hypoxia and hypercapnia (HCC)-resolved as of 01/18/2021, (present on admission) -- Resolved.  COPD exacerbation resolved.   COPD with acute exacerbation ([place])- (present on admission) -- Resolved with standard treatment.  Discharged on oral steroids. Continue BiPAP at night as able  although the patient reports that he has not been able to use it at [facility] for many months now.   Acute on chronic diastolic CHF (congestive heart failure) ([place]) -- Echocardiogram October 2020 again showed normal LVEF, grade 1 diastolic dysfunction.  Significant volume overload now appears resolved.  Outpatient follow-up with PCP.  Consider diuretic in future if needed.   OSA (obstructive sleep apnea)- (present on admission) -- BiPAP nightly ideally   Type 2 diabetes mellitus with hyperglycemia ([place]) -- Hyperglycemic complicated by steroids, overall stable.  Apparently only on metformin as an outpatient.  Expect blood sugars to continue to improve as steroids are decreased. -- Resume metformin on discharge   Homeless -- Appreciate TOC, likely to return to [facility] where he has been living for several months despite the inability to use his BiPAP nightly.      Schizo affective schizophrenia ([place])- (present on admission) -- Patient not yet established with psychiatry in the area.  Continue Resporal and duloxetine as recommended by inpatient psychiatry consultation.  Outpatient establishment with psychiatric care recommended.   Obesity hypoventilation syndrome (HCC) -- Suspected diagnosis based on body habitus.  Weight loss as an outpatient recommended.   Hypertension associated with diabetes ([place])- (present on admission) -- Appears stable, continue amlodipine.   Cigarette smoker- (present on admission) -- Recommend cessation    03/24/21 Patient returns in follow-up complains of some right eye irritation that persist and increased edema in the lower extremity and also increased low back pain.  Patient with hypertension diabetes COPD severe sleep apnea.  He still smoking half pack a day of cigarettes.  On arrival blood pressure is good 110/76 blood sugar 208.  The patient was seen in the shelter clinic yesterday and his amlodipine was stopped because of lower extremity edema  which is worsened.  He was placed on Lasix twice to 3 times weekly and this prescription is being filled at this time.  He is on some topical eyedrops without much improvement. Patient continues to score high on his suicide screen however does not have specific plans to end his life he is on the respite all Past Medical History:  Diagnosis Date  . Bipolar 1 disorder, depressed ([place])   . COPD (chronic obstructive pulmonary disease) ([place])   . Depression with anxiety   . Diabetes mellitus type II, non insulin dependent ([place]) 11/2020   A1c 7.3  . Fall at home, initial encounter 05/28/2020   Last Assessment & Plan:  Formatting of this note might be different from the original. Acute onset, occurred few days ago.  Reports having a mechanical fall, fell forward injuring his left side including arm, knee.  Denies any vision changes, head trauma or loss of consciousness.  Patient is being referred to the emergency room for lower GI bleed, can also get evaluated for his injuries.  . Gastric ulcer    when on NSAIDs  . GERD (gastroesophageal reflux disease)   . Helicobacter pylori antibody positive 07/04/2018   Formatting of this note might be different  from the original. Started triple therapy 06/30/18.   Needs HP stool antigen in 8 weeks,  Off PPI for 10 days.  Mar[place] Kitchen HTN (hypertension)   . OSA (obstructive sleep apnea)    not currently on CPAP  . Rheumatoid arthritis ([place])   . Right kidney mass, 2.8 cm 11/2020  . Tobacco abuse     Past Surgical History:  Procedure Laterality Date  . left index finger surgery Left     Family History  Problem Relation Age of Onset  . Schizophrenia Mother   . Diabetes Mellitus II Father     Social History   Socioeconomic History  . Marital status: Single    Spouse name: Not on file  . Number of children: Not on file  . Years of education: Not on file  . Highest education level: Not on file  Occupational History  . Not on file  Tobacco Use  . Smoking status:  Every Day    Packs/day: 1.00    Types: Cigarettes  . Smokeless tobacco: Never  Vaping Use  . Vaping Use: Never used  Substance and Sexual Activity  . Alcohol use: Not Currently  . Drug use: Never  . Sexual activity: Not on file  Other Topics Concern  . Not on file  Social History Narrative  . Not on file   Social Determinants of Health   Financial Resource Strain: Not on file  Food Insecurity: Not on file  Transportation Needs: Not on file  Physical Activity: Not on file  Stress: Not on file  Social Connections: Not on file  Intimate Partner Violence: Not on file    Outpatient Medications Prior to Visit  Medication Sig Dispense Refill  . albuterol (VENTOLIN HFA) 108 (90 Base) MCG/ACT inhaler Inhale 2 puffs into the lungs every 6  hours as needed for wheezing or shortness of breath. 8.5 g 2  . atorvastatin (LIPITOR) 10 MG tablet Take 1 tablet (10 mg total) by mouth once daily. 30 tablet 3  . budesonide-formoterol (SYMBICORT) 160-4.5 MCG/ACT inhaler Inhale 2 puffs into the lungs 2 (two) times daily. 10.2 g 12  . dapagliflozin propanediol (FARXIGA) 5 MG TABS tablet Take 2 tablets (10 mg total) by mouth daily. 60 tablet 3  . DULoxetine (CYMBALTA) 60 MG capsule Take 1 capsule (60 mg total) by mouth daily. 30 capsule 3  . famotidine (PEPCID) 20 MG tablet Take 1 tablet (20 mg total) by mouth 2 (two) times daily. 60 tablet 0  . fluticasone (FLONASE) 50 MCG/ACT nasal spray Place 2 sprays into both nostrils daily. 16 g 6  . furosemide (LASIX) 20 MG tablet Take 2 tablets (40 mg total) by mouth 3 (three) times a week. 24 tablet 0  . gabapentin (NEURONTIN) 300 MG capsule Take 2 capsules in the morning AND 1 capsule midday AND 2 capsules every evening. 150 capsule 0  . hydrOXYzine (ATARAX) 25 MG tablet Take 1 tablet (25 mg total) by mouth 3 (three) times daily as needed. 90 tablet 2  . metFORMIN (GLUCOPHAGE) 500 MG tablet Take 2 tablets (1,000 mg total) by mouth 2 (two) times daily with a  meal. 120 tablet 0  . Multiple Vitamins-Minerals (PRESERVISION AREDS 2) CAPS Take 1 capsule by mouth 2 (two) times daily with food. Take 1 in the morning and 1 in the evening. 120 capsule 6  . olopatadine (PATANOL) 0.1 % ophthalmic solution Place 1 drop into the right eye 2 (two) times daily. 5 mL 12  . risperidone (RISPERDAL) 4  MG tablet Take 1 tablet (4 mg total) by mouth once daily. 30 tablet 0  . Spacer/Aero-Holding Chambers (AEROCHAMBER PLUS WITH MASK) inhaler Use as directed 1 each 0  . tamsulosin (FLOMAX) 0.4 MG CAPS capsule Take 1 capsule (0.4 mg total) by mouth daily. 30 capsule 3   No facility-administered medications prior to visit.    No Known Allergies  ROS Review of Systems  Constitutional:  Negative for fever.  HENT:  Negative for congestion, hearing loss and rhinorrhea.   Eyes:  Positive for discharge and visual disturbance.  Respiratory:  Positive for cough. Negative for chest tightness and shortness of breath.   Cardiovascular:  Negative for palpitations.  Gastrointestinal:  Negative for diarrhea and vomiting.  Endocrine: Positive for polyuria. Negative for polydipsia and polyphagia.  Genitourinary:  Positive for frequency. Negative for dysuria, hematuria, scrotal swelling and urgency.  Musculoskeletal:  Positive for back pain and myalgias.  Skin:  Negative for rash.  Neurological:  Positive for light-headedness (with standing or sitting down quickly). Negative for syncope.  Psychiatric/Behavioral:  Positive for dysphoric mood and sleep disturbance. Negative for suicidal ideas. The patient is nervous/anxious.      Objective:    Physical Exam Constitutional:      Appearance: He is obese.  HENT:     Head: Normocephalic and atraumatic.     Nose: Nose normal.     Mouth/Throat:     Mouth: Mucous membranes are moist.     Pharynx: Oropharynx is clear.  Eyes:     General: Lids are normal. Lids are everted, no foreign bodies appreciated.        Right eye: Discharge  present.        Left eye: No discharge.     Conjunctiva/sclera:     Right eye: Right conjunctiva is injected.     Comments: Significant conjunctival erythema  Cardiovascular:     Rate and Rhythm: Normal rate and regular rhythm.     Heart sounds: Heart sounds are distant. No murmur heard.   No friction rub. No gallop.  Pulmonary:     Breath sounds: Examination of the right-upper field reveals wheezing. Examination of the left-upper field reveals wheezing. Decreased breath sounds and wheezing present.  Musculoskeletal:     Right lower leg: Edema present.     Left lower leg: Edema present.  Skin:    General: Skin is warm and dry.  Psychiatric:        Attention and Perception: Attention normal.        Mood and Affect: Mood is depressed.        Speech: Speech normal.        Behavior: Behavior normal.    There were no vitals taken for this visit. Wt Readings from Last 3 Encounters:  05/20/21 (!) 381 lb 9.6 oz (173.1 kg)  05/12/21 (!) 382 lb 12.8 oz (173.6 kg)  05/05/21 (!) 385 lb 12.8 oz (175 kg)     Health Maintenance Due  Topic Date Due  . COLONOSCOPY (Pts 45-90yr Insurance coverage will need to be confirmed)  Never done  . COVID-19 Vaccine (3 - Booster for Moderna series) 09/10/2019    There are no preventive care reminders to display for this patient.  No results found for: TSH Lab Results  Component Value Date   WBC 11.9 (H) 03/25/2021   HGB 16.1 03/25/2021   HCT 48.3 03/25/2021   MCV 86 03/25/2021   PLT 253 03/25/2021   Lab Results  Component Value Date  NA 142 03/25/2021   K 4.5 03/25/2021   CO2 26 03/25/2021   GLUCOSE 152 (H) 03/25/2021   BUN 14 03/25/2021   CREATININE 0.82 03/25/2021   BILITOT 0.2 03/25/2021   ALKPHOS 101 03/25/2021   AST 11 03/25/2021   ALT 13 03/25/2021   PROT 6.6 03/25/2021   ALBUMIN 4.0 03/25/2021   CALCIUM 8.8 03/25/2021   ANIONGAP 10 01/18/2021   EGFR 108 03/25/2021   Lab Results  Component Value Date   CHOL 186  01/25/2021   Lab Results  Component Value Date   HDL 42 01/25/2021   Lab Results  Component Value Date   LDLCALC 99 01/25/2021   Lab Results  Component Value Date   TRIG 268 (H) 01/25/2021   Lab Results  Component Value Date   CHOLHDL 4.4 01/25/2021   Lab Results  Component Value Date   HGBA1C 7.7 (A) 01/25/2021      Assessment & Plan:   Problem List Items Addressed This Visit   None No orders of the defined types were placed in this encounter.   Follow-up: Follow up with [name] in 2 months.    Asencion Noble, MD

## 2021-06-01 ENCOUNTER — Encounter: Payer: Self-pay | Admitting: Critical Care Medicine

## 2021-06-01 ENCOUNTER — Other Ambulatory Visit (HOSPITAL_COMMUNITY): Payer: Self-pay

## 2021-06-01 ENCOUNTER — Ambulatory Visit: Payer: Medicaid Other | Attending: Critical Care Medicine | Admitting: Critical Care Medicine

## 2021-06-01 VITALS — BP 116/76 | HR 103 | Temp 98.8°F | Wt 382.8 lb

## 2021-06-01 DIAGNOSIS — I5032 Chronic diastolic (congestive) heart failure: Secondary | ICD-10-CM | POA: Insufficient documentation

## 2021-06-01 DIAGNOSIS — F332 Major depressive disorder, recurrent severe without psychotic features: Secondary | ICD-10-CM

## 2021-06-01 DIAGNOSIS — Z6841 Body Mass Index (BMI) 40.0 and over, adult: Secondary | ICD-10-CM | POA: Insufficient documentation

## 2021-06-01 DIAGNOSIS — N2889 Other specified disorders of kidney and ureter: Secondary | ICD-10-CM

## 2021-06-01 DIAGNOSIS — J42 Unspecified chronic bronchitis: Secondary | ICD-10-CM | POA: Insufficient documentation

## 2021-06-01 DIAGNOSIS — Z79899 Other long term (current) drug therapy: Secondary | ICD-10-CM | POA: Insufficient documentation

## 2021-06-01 DIAGNOSIS — Z5901 Sheltered homelessness: Secondary | ICD-10-CM

## 2021-06-01 DIAGNOSIS — I152 Hypertension secondary to endocrine disorders: Secondary | ICD-10-CM

## 2021-06-01 DIAGNOSIS — E662 Morbid (severe) obesity with alveolar hypoventilation: Secondary | ICD-10-CM | POA: Insufficient documentation

## 2021-06-01 DIAGNOSIS — I110 Hypertensive heart disease with heart failure: Secondary | ICD-10-CM | POA: Insufficient documentation

## 2021-06-01 DIAGNOSIS — Z720 Tobacco use: Secondary | ICD-10-CM

## 2021-06-01 DIAGNOSIS — E1165 Type 2 diabetes mellitus with hyperglycemia: Secondary | ICD-10-CM | POA: Insufficient documentation

## 2021-06-01 DIAGNOSIS — J4489 Other specified chronic obstructive pulmonary disease: Secondary | ICD-10-CM

## 2021-06-01 DIAGNOSIS — J449 Chronic obstructive pulmonary disease, unspecified: Secondary | ICD-10-CM | POA: Insufficient documentation

## 2021-06-01 DIAGNOSIS — Z23 Encounter for immunization: Secondary | ICD-10-CM

## 2021-06-01 DIAGNOSIS — F17210 Nicotine dependence, cigarettes, uncomplicated: Secondary | ICD-10-CM | POA: Insufficient documentation

## 2021-06-01 DIAGNOSIS — K219 Gastro-esophageal reflux disease without esophagitis: Secondary | ICD-10-CM | POA: Insufficient documentation

## 2021-06-01 DIAGNOSIS — E1159 Type 2 diabetes mellitus with other circulatory complications: Secondary | ICD-10-CM

## 2021-06-01 DIAGNOSIS — F329 Major depressive disorder, single episode, unspecified: Secondary | ICD-10-CM | POA: Insufficient documentation

## 2021-06-01 DIAGNOSIS — K029 Dental caries, unspecified: Secondary | ICD-10-CM

## 2021-06-01 DIAGNOSIS — Z7984 Long term (current) use of oral hypoglycemic drugs: Secondary | ICD-10-CM | POA: Insufficient documentation

## 2021-06-01 LAB — POCT GLYCOSYLATED HEMOGLOBIN (HGB A1C): HbA1c, POC (controlled diabetic range): 8.3 % — AB (ref 0.0–7.0)

## 2021-06-01 LAB — GLUCOSE, POCT (MANUAL RESULT ENTRY): POC Glucose: 194 mg/dl — AB (ref 70–99)

## 2021-06-01 MED ORDER — CIPROFLOXACIN HCL 0.3 % OP OINT
TOPICAL_OINTMENT | Freq: Two times a day (BID) | OPHTHALMIC | 0 refills | Status: DC
  Filled 2021-06-01: qty 3.5, 14d supply, fill #0

## 2021-06-01 MED ORDER — CIPROFLOXACIN HCL 0.3 % OP SOLN
1.0000 [drp] | OPHTHALMIC | 0 refills | Status: DC
  Filled 2021-06-01: qty 5, 7d supply, fill #0

## 2021-06-01 NOTE — Patient Instructions (Signed)
No change in your medications  Another prescription for gel which is an antibiotic will be prescribed we will bring this to you on either Wednesday or Thursday to start  Prevnar 20 pneumonia vaccine was given  No labs are needed at this visit  We discussed colon cancer screening elected to hold off on this for now until you get more stable housing situation  We will get you into see a dentist to have dental care done through a patient assistance program  We will continue to follow you weekly at the shelter and return to Dr. Delford FieldWright for outpatient care in 4 months

## 2021-06-01 NOTE — Assessment & Plan Note (Addendum)
Not at goal continue [place] and metformin  Patient was off [place] for period of time  A1c today is up to 8.3 was down in the 7 range before also needs dental care

## 2021-06-01 NOTE — Assessment & Plan Note (Signed)
Associated diabetes will receive dental care through the dental care program at the homeless shelter

## 2021-06-01 NOTE — Assessment & Plan Note (Signed)
Patient has follow-up with mental health

## 2021-06-01 NOTE — Assessment & Plan Note (Signed)
Continue current blood pressure medications no changes made patient is at goal

## 2021-06-01 NOTE — Assessment & Plan Note (Signed)
  .   Current smoking consumption amount: 1PPD or more  Dicsussion on advise to quit smoking and smoking impacts: CV lung impacts  Patient's willingness to quit:  Not ready to quit completely  Methods to quit smoking discussed:  Behavioral mod and nicotine replacemtn  No prescribed  Medication management of smoking session drugs discussed: no meds prescribed  Setting quit date not established   Follow-up arranged  Weekly at shelter clinic   Time spent counseling the patient:  5min   

## 2021-06-01 NOTE — Assessment & Plan Note (Signed)
Continue inhaled medications 

## 2021-06-01 NOTE — Assessment & Plan Note (Signed)
They continued observation

## 2021-06-01 NOTE — Assessment & Plan Note (Signed)
Continue PPI therapy. 

## 2021-06-01 NOTE — Assessment & Plan Note (Signed)
Chronic diastolic heart failure compensated at this time continue [place]Far[place] and furosemide

## 2021-06-01 NOTE — Assessment & Plan Note (Signed)
Continue CPAP therapy 

## 2021-06-03 ENCOUNTER — Other Ambulatory Visit (HOSPITAL_COMMUNITY): Payer: Self-pay

## 2021-06-03 ENCOUNTER — Other Ambulatory Visit: Payer: Self-pay | Admitting: *Deleted

## 2021-06-03 MED ORDER — RISPERIDONE 4 MG PO TABS
4.0000 mg | ORAL_TABLET | Freq: Every day | ORAL | 0 refills | Status: DC
  Filled 2021-06-03: qty 30, 30d supply, fill #0

## 2021-06-03 MED ORDER — METFORMIN HCL 500 MG PO TABS
1000.0000 mg | ORAL_TABLET | Freq: Two times a day (BID) | ORAL | 0 refills | Status: DC
  Filled 2021-06-03: qty 120, 30d supply, fill #0

## 2021-06-03 MED ORDER — TAMSULOSIN HCL 0.4 MG PO CAPS
0.4000 mg | ORAL_CAPSULE | Freq: Every day | ORAL | 3 refills | Status: DC
  Filled 2021-06-03: qty 30, 30d supply, fill #0

## 2021-06-09 ENCOUNTER — Other Ambulatory Visit: Payer: Self-pay | Admitting: Physician Assistant

## 2021-06-09 ENCOUNTER — Other Ambulatory Visit (HOSPITAL_COMMUNITY): Payer: Self-pay

## 2021-06-09 ENCOUNTER — Encounter: Payer: Self-pay | Admitting: Physician Assistant

## 2021-06-09 MED ORDER — FUROSEMIDE 40 MG PO TABS
40.0000 mg | ORAL_TABLET | Freq: Every day | ORAL | 11 refills | Status: DC
Start: 2021-06-09 — End: 2021-07-07
  Filled 2021-06-09: qty 30, 30d supply, fill #0

## 2021-06-09 MED ORDER — PREDNISONE 10 MG PO TABS
40.0000 mg | ORAL_TABLET | Freq: Every day | ORAL | 0 refills | Status: DC
  Filled 2021-06-09: qty 20, 5d supply, fill #0

## 2021-06-09 MED ORDER — SULFAMETHOXAZOLE-TRIMETHOPRIM 800-160 MG PO TABS
1.0000 | ORAL_TABLET | Freq: Two times a day (BID) | ORAL | 0 refills | Status: DC
  Filled 2021-06-09: qty 14, 7d supply, fill #0

## 2021-06-09 MED ORDER — PREDNISONE 10 MG PO TABS
ORAL_TABLET | ORAL | 0 refills | Status: DC
  Filled 2021-06-09: qty 22, 8d supply, fill #0

## 2021-06-09 MED ORDER — CEFDINIR 300 MG PO CAPS
300.0000 mg | ORAL_CAPSULE | Freq: Two times a day (BID) | ORAL | 0 refills | Status: DC
  Filled 2021-06-09: qty 14, 7d supply, fill #0

## 2021-06-09 NOTE — Progress Notes (Unsigned)
Breathing has been worse the last few days. No fever, aches are about the same.   Productive cough, light brown sputum.   Wt Readings from Last 3 Encounters:  06/09/21 (!) 381 lb (172.8 kg)  06/01/21 (!) 382 lb 12.8 oz (173.6 kg)  05/20/21 (!) 381 lb 9.6 oz (173.1 kg)    3+ LE edema. Wt unchanged  120/79, 97, 91% RA  Insp/exp wheezing, + rales 2/4 L leg strength (can barely lift leg), 3/4 R leg strength R arm slightly weaker than the L, no obvious facial droop, speech no obvious difference.   He mentioned that he thought he had a stroke about 2 weeks ago.   He

## 2021-06-10 ENCOUNTER — Other Ambulatory Visit: Payer: Self-pay | Admitting: *Deleted

## 2021-06-10 MED ORDER — METFORMIN HCL 500 MG PO TABS
1000.0000 mg | ORAL_TABLET | Freq: Two times a day (BID) | ORAL | 0 refills | Status: DC
  Filled 2021-06-10: qty 120, 30d supply, fill #0

## 2021-06-10 MED ORDER — GABAPENTIN 300 MG PO CAPS
ORAL_CAPSULE | ORAL | 0 refills | Status: DC
Start: 2021-06-10 — End: 2021-08-04
  Filled 2021-06-10: qty 150, 30d supply, fill #0

## 2021-06-10 MED ORDER — TAMSULOSIN HCL 0.4 MG PO CAPS
0.4000 mg | ORAL_CAPSULE | Freq: Every day | ORAL | 3 refills | Status: DC
Start: 2021-06-10 — End: 2021-08-04
  Filled 2021-06-10: qty 30, 30d supply, fill #0

## 2021-06-10 MED ORDER — RISPERIDONE 4 MG PO TABS
4.0000 mg | ORAL_TABLET | Freq: Every day | ORAL | 0 refills | Status: DC
  Filled 2021-06-10: qty 30, 30d supply, fill #0

## 2021-06-11 ENCOUNTER — Other Ambulatory Visit (HOSPITAL_COMMUNITY): Payer: Self-pay

## 2021-06-16 ENCOUNTER — Encounter: Payer: Self-pay | Admitting: Physician Assistant

## 2021-06-16 ENCOUNTER — Other Ambulatory Visit: Payer: Self-pay | Admitting: Critical Care Medicine

## 2021-06-16 NOTE — Progress Notes (Signed)
Pt seen by Dr Joya Gaskins.  He is breathing better, coughing less. Occasionally productive.  Urinated a great deal this past week. Needs to stay on the higher dose of Lasix.   Steroids and ABX are completed   Not sure if LE edema is better, but it is greatly improved.   Rales RUL, insp/exp wheeze. 1+ LE edema.   He is wearing CPAP at night. Uses his inhaler.  He has not heard anything about his disability. We will look into this.  Wt Readings from Last 3 Encounters:  06/16/21 (!) 379 lb 12.8 oz (172.3 kg)  06/09/21 (!) 381 lb (172.8 kg)  06/01/21 (!) 382 lb 12.8 oz (173.6 kg)

## 2021-06-17 ENCOUNTER — Other Ambulatory Visit (HOSPITAL_COMMUNITY): Payer: Self-pay

## 2021-06-17 ENCOUNTER — Other Ambulatory Visit: Payer: Self-pay | Admitting: *Deleted

## 2021-06-17 MED ORDER — FAMOTIDINE 20 MG PO TABS
20.0000 mg | ORAL_TABLET | Freq: Two times a day (BID) | ORAL | 0 refills | Status: DC
  Filled 2021-06-17: qty 60, 30d supply, fill #0

## 2021-06-17 MED ORDER — ATORVASTATIN CALCIUM 10 MG PO TABS
10.0000 mg | ORAL_TABLET | Freq: Every day | ORAL | 3 refills | Status: DC
  Filled 2021-06-17: qty 30, 30d supply, fill #0

## 2021-06-17 MED ORDER — DULOXETINE HCL 60 MG PO CPEP
60.0000 mg | ORAL_CAPSULE | Freq: Every day | ORAL | 3 refills | Status: DC
  Filled 2021-06-17: qty 30, 30d supply, fill #0

## 2021-06-23 ENCOUNTER — Encounter: Payer: Self-pay | Admitting: Physician Assistant

## 2021-06-23 ENCOUNTER — Other Ambulatory Visit: Payer: Self-pay | Admitting: Critical Care Medicine

## 2021-06-23 ENCOUNTER — Other Ambulatory Visit (HOSPITAL_COMMUNITY): Payer: Self-pay

## 2021-06-23 ENCOUNTER — Ambulatory Visit: Payer: Self-pay | Admitting: Podiatry

## 2021-06-23 MED ORDER — CEFDINIR 300 MG PO CAPS
300.0000 mg | ORAL_CAPSULE | Freq: Two times a day (BID) | ORAL | 0 refills | Status: AC
  Filled 2021-06-23: qty 14, 7d supply, fill #0

## 2021-06-23 MED ORDER — CIPROFLOXACIN HCL 0.3 % OP SOLN
1.0000 [drp] | OPHTHALMIC | 0 refills | Status: DC
Start: 2021-06-23 — End: 2021-08-11
  Filled 2021-06-23: qty 5, 16d supply, fill #0

## 2021-06-23 NOTE — Progress Notes (Signed)
Pt seen by Dr Joya Gaskins.  He hurts all over, mostly in his back.   He is still coughing, about the same as last week, beige/white sputum.   144/93,  55, 91%  379.6 lbs R eye is reddened and has some swelling, +yellow discharge Thick yellow discharge in R sinus and post-nasal drip.  Diffuse rhonchi, insp wheeze 1+ LE edema  Wt Readings from Last 3 Encounters:  06/23/21 (!) 379 lb 9.6 oz (172.2 kg)  06/16/21 (!) 379 lb 12.8 oz (172.3 kg)  06/09/21 (!) 381 lb (172.8 kg)   He will get cefdinir 300 mg bid x 7 days, cipro 0.3% eye drops.  He was also given instructions on  using his inhaler more effectively.   [name], PA-C 06/23/2021 3:02 PM

## 2021-06-24 ENCOUNTER — Other Ambulatory Visit: Payer: Self-pay | Admitting: *Deleted

## 2021-06-24 ENCOUNTER — Other Ambulatory Visit (HOSPITAL_COMMUNITY): Payer: Self-pay

## 2021-06-24 MED ORDER — FAMOTIDINE 20 MG PO TABS
20.0000 mg | ORAL_TABLET | Freq: Two times a day (BID) | ORAL | 0 refills | Status: DC
  Filled 2021-06-24: qty 60, 30d supply, fill #0

## 2021-06-24 MED ORDER — DULOXETINE HCL 60 MG PO CPEP
60.0000 mg | ORAL_CAPSULE | Freq: Every day | ORAL | 3 refills | Status: DC
  Filled 2021-06-24: qty 30, 30d supply, fill #0

## 2021-06-24 MED ORDER — HYDROXYZINE HCL 25 MG PO TABS
25.0000 mg | ORAL_TABLET | Freq: Three times a day (TID) | ORAL | 2 refills | Status: DC | PRN
  Filled 2021-06-24: qty 90, 30d supply, fill #0

## 2021-06-24 MED ORDER — ATORVASTATIN CALCIUM 10 MG PO TABS
10.0000 mg | ORAL_TABLET | Freq: Every day | ORAL | 3 refills | Status: DC
  Filled 2021-06-24: qty 30, 30d supply, fill #0

## 2021-06-29 ENCOUNTER — Other Ambulatory Visit: Payer: Self-pay

## 2021-07-01 ENCOUNTER — Encounter: Payer: Self-pay | Admitting: *Deleted

## 2021-07-07 ENCOUNTER — Other Ambulatory Visit: Payer: Self-pay | Admitting: Critical Care Medicine

## 2021-07-07 ENCOUNTER — Encounter: Payer: Self-pay | Admitting: Critical Care Medicine

## 2021-07-07 ENCOUNTER — Other Ambulatory Visit (HOSPITAL_COMMUNITY): Payer: Self-pay

## 2021-07-07 ENCOUNTER — Encounter: Payer: Self-pay | Admitting: Physician Assistant

## 2021-07-07 MED ORDER — LEVOFLOXACIN 500 MG PO TABS
500.0000 mg | ORAL_TABLET | Freq: Every day | ORAL | 0 refills | Status: DC
Start: 2021-07-07 — End: 2021-07-26
  Filled 2021-07-07: qty 5, 5d supply, fill #0

## 2021-07-07 MED ORDER — FUROSEMIDE 40 MG PO TABS
40.0000 mg | ORAL_TABLET | Freq: Two times a day (BID) | ORAL | 11 refills | Status: DC
  Filled 2021-07-07: qty 60, 30d supply, fill #0

## 2021-07-07 NOTE — Congregational Nurse Program (Signed)
Pt is called in from courtyard of shelter, he has a sweatshirt on and is sweating profusely. Weight is up. He is c/o heat. After VS obtained he is escorted to lobby and assisted to chair, given water, continuous pulse OX. In 80's. He is to rest til dr Delford Field sees him. Checking on him frequently.sweatshirt taken off and placed on table. 1256 85% 108 HR Dozing in chair 1309 86% 102 HR dozing

## 2021-07-07 NOTE — Progress Notes (Signed)
Pt seen by Dr Delford Field.  He had been going to [place] during the day, driving a loaned car.   He was back at the shelter every night.   Was missing some of his meds.   Denies ETOH and drug use.  He has been drinking hot water, because the bottles heat up during the day.  Denies productive cough. Using both Inhalers, has had today.   On Exam: insp/exp wheezes, rales bases. Thick green discharge both nares. R eye looks good, some puffiness underneath it.  +LE edema.  Dx sinusitis and bronchitis.  Wt up 6 lbs in 2 weeks.  Today's Vitals   07/07/21 1350  BP: (!) 154/100  Pulse: (!) 106  SpO2: (!) 84%  Weight: (!) 385 lb 9.6 oz (174.9 kg)   Body mass index is 58.63 kg/m.  Wt Readings from Last 3 Encounters:  07/07/21 (!) 385 lb 9.6 oz (174.9 kg)  07/07/21 (!) 385 lb 6 oz (174.8 kg)  06/23/21 (!) 379 lb 9.6 oz (172.2 kg)   Increase Lasix to 40 mg bid x 3 days, then 1 tab daily.   No other meds need to be refilled at this time.  He will be started on Levaquin 500 mg qd x 5 days   [name], PA-C 07/07/2021 2:05 PM

## 2021-07-07 NOTE — Congregational Nurse Program (Unsigned)
  Dept: [phone]   Congregational Nurse Program Note  Date of Encounter: 07/01/2021  Past Medical History: Past Medical History:  Diagnosis Date   Bipolar 1 disorder, depressed (HCC)    COPD (chronic obstructive pulmonary disease) (HCC)    Depression with anxiety    Diabetes mellitus type II, non insulin dependent (HCC) 11/2020   A1c 7.3   Fall at home, initial encounter 05/28/2020   Last Assessment & Plan:  Formatting of this note might be different from the original. Acute onset, occurred few days ago.  Reports having a mechanical fall, fell forward injuring his left side including arm, knee.  Denies any vision changes, head trauma or loss of consciousness.  Patient is being referred to the emergency room for lower GI bleed, can also get evaluated for his injuries.   Gastric ulcer    when on NSAIDs   GERD (gastroesophageal reflux disease)    Helicobacter pylori antibody positive 07/04/2018   Formatting of this note might be different from the original. Started triple therapy 06/30/18.   Needs HP stool antigen in 8 weeks,  Off PPI for 10 days.   HTN (hypertension)    OSA (obstructive sleep apnea)    not currently on CPAP   Rheumatoid arthritis (HCC)    Right kidney mass, 2.8 cm 11/2020   Tobacco abuse     Encounter Details:  CNP Questionnaire - 07/07/21 1246       Questionnaire   Do you give verbal consent to treat you today? Yes    Location Patient Served  [facility]    Visit Setting [facility] or Organization    Patient Status Homeless    Insurance Uninsured ([employer]/Self-Pay)    Insurance Referral N/A    Medication Have Medication Insecurities    Medical Provider Yes    Screening Referrals N/A    Medical Referral N/A    Medical Appointment Made N/A    Food Have Food Insecurities    Transportation Need transportation assistance    Housing/Utilities No permanent housing    Interpersonal Safety Do not feel safe at current residence    Intervention  Advocate;Navigate Healthcare System;Educate    ED Visit Averted N/A    Life-Saving Intervention Made N/A

## 2021-07-08 ENCOUNTER — Other Ambulatory Visit: Payer: Self-pay | Admitting: *Deleted

## 2021-07-08 ENCOUNTER — Other Ambulatory Visit (HOSPITAL_COMMUNITY): Payer: Self-pay

## 2021-07-08 MED ORDER — DAPAGLIFLOZIN PROPANEDIOL 5 MG PO TABS
10.0000 mg | ORAL_TABLET | Freq: Every day | ORAL | 3 refills | Status: DC
  Filled 2021-07-08: qty 60, 30d supply, fill #0

## 2021-07-09 ENCOUNTER — Other Ambulatory Visit: Payer: Self-pay

## 2021-07-09 ENCOUNTER — Other Ambulatory Visit (HOSPITAL_COMMUNITY): Payer: Self-pay

## 2021-07-09 MED ORDER — DAPAGLIFLOZIN PROPANEDIOL 10 MG PO TABS
10.0000 mg | ORAL_TABLET | Freq: Every day | ORAL | 3 refills | Status: DC
  Filled 2021-07-09: qty 30, 30d supply, fill #0

## 2021-07-14 ENCOUNTER — Inpatient Hospital Stay (HOSPITAL_COMMUNITY)
Admission: EM | Admit: 2021-07-14 | Discharge: 2021-07-26 | DRG: 871 | Disposition: A | Discharge status: Home / Self Care | Payer: Medicaid Other | Attending: Internal Medicine | Admitting: Internal Medicine

## 2021-07-14 ENCOUNTER — Encounter: Payer: Self-pay | Admitting: Physician Assistant

## 2021-07-14 ENCOUNTER — Encounter: Payer: Self-pay | Admitting: *Deleted

## 2021-07-14 ENCOUNTER — Emergency Department (HOSPITAL_COMMUNITY): Payer: Medicaid Other

## 2021-07-14 DIAGNOSIS — E11649 Type 2 diabetes mellitus with hypoglycemia without coma: Secondary | ICD-10-CM | POA: Diagnosis not present

## 2021-07-14 DIAGNOSIS — R9431 Abnormal electrocardiogram [ECG] [EKG]: Secondary | ICD-10-CM | POA: Diagnosis present

## 2021-07-14 DIAGNOSIS — I5033 Acute on chronic diastolic (congestive) heart failure: Secondary | ICD-10-CM | POA: Diagnosis present

## 2021-07-14 DIAGNOSIS — I110 Hypertensive heart disease with heart failure: Secondary | ICD-10-CM | POA: Diagnosis present

## 2021-07-14 DIAGNOSIS — Z818 Family history of other mental and behavioral disorders: Secondary | ICD-10-CM

## 2021-07-14 DIAGNOSIS — F17210 Nicotine dependence, cigarettes, uncomplicated: Secondary | ICD-10-CM | POA: Diagnosis present

## 2021-07-14 DIAGNOSIS — Z5901 Sheltered homelessness: Secondary | ICD-10-CM | POA: Diagnosis not present

## 2021-07-14 DIAGNOSIS — J449 Chronic obstructive pulmonary disease, unspecified: Secondary | ICD-10-CM | POA: Diagnosis present

## 2021-07-14 DIAGNOSIS — Z79899 Other long term (current) drug therapy: Secondary | ICD-10-CM

## 2021-07-14 DIAGNOSIS — Z20822 Contact with and (suspected) exposure to covid-19: Secondary | ICD-10-CM | POA: Diagnosis present

## 2021-07-14 DIAGNOSIS — E662 Morbid (severe) obesity with alveolar hypoventilation: Secondary | ICD-10-CM | POA: Diagnosis present

## 2021-07-14 DIAGNOSIS — J189 Pneumonia, unspecified organism: Secondary | ICD-10-CM | POA: Diagnosis present

## 2021-07-14 DIAGNOSIS — Z6841 Body Mass Index (BMI) 40.0 and over, adult: Secondary | ICD-10-CM

## 2021-07-14 DIAGNOSIS — A419 Sepsis, unspecified organism: Secondary | ICD-10-CM | POA: Diagnosis present

## 2021-07-14 DIAGNOSIS — M7989 Other specified soft tissue disorders: Secondary | ICD-10-CM | POA: Diagnosis not present

## 2021-07-14 DIAGNOSIS — K219 Gastro-esophageal reflux disease without esophagitis: Secondary | ICD-10-CM | POA: Diagnosis present

## 2021-07-14 DIAGNOSIS — E1165 Type 2 diabetes mellitus with hyperglycemia: Secondary | ICD-10-CM | POA: Diagnosis present

## 2021-07-14 DIAGNOSIS — Y92239 Unspecified place in hospital as the place of occurrence of the external cause: Secondary | ICD-10-CM | POA: Diagnosis present

## 2021-07-14 DIAGNOSIS — R0602 Shortness of breath: Secondary | ICD-10-CM | POA: Diagnosis present

## 2021-07-14 DIAGNOSIS — J440 Chronic obstructive pulmonary disease with acute lower respiratory infection: Secondary | ICD-10-CM | POA: Diagnosis present

## 2021-07-14 DIAGNOSIS — N179 Acute kidney failure, unspecified: Secondary | ICD-10-CM | POA: Diagnosis not present

## 2021-07-14 DIAGNOSIS — H109 Unspecified conjunctivitis: Secondary | ICD-10-CM

## 2021-07-14 DIAGNOSIS — Z91199 Patient's noncompliance with other medical treatment and regimen due to unspecified reason: Secondary | ICD-10-CM

## 2021-07-14 DIAGNOSIS — E1140 Type 2 diabetes mellitus with diabetic neuropathy, unspecified: Secondary | ICD-10-CM | POA: Diagnosis not present

## 2021-07-14 DIAGNOSIS — J9601 Acute respiratory failure with hypoxia: Secondary | ICD-10-CM | POA: Diagnosis not present

## 2021-07-14 DIAGNOSIS — Z833 Family history of diabetes mellitus: Secondary | ICD-10-CM

## 2021-07-14 DIAGNOSIS — J9622 Acute and chronic respiratory failure with hypercapnia: Secondary | ICD-10-CM | POA: Diagnosis present

## 2021-07-14 DIAGNOSIS — Z5900 Homelessness unspecified: Secondary | ICD-10-CM

## 2021-07-14 DIAGNOSIS — Z7984 Long term (current) use of oral hypoglycemic drugs: Secondary | ICD-10-CM | POA: Diagnosis not present

## 2021-07-14 DIAGNOSIS — J9621 Acute and chronic respiratory failure with hypoxia: Secondary | ICD-10-CM | POA: Diagnosis present

## 2021-07-14 DIAGNOSIS — E785 Hyperlipidemia, unspecified: Secondary | ICD-10-CM | POA: Diagnosis present

## 2021-07-14 DIAGNOSIS — F32A Depression, unspecified: Secondary | ICD-10-CM | POA: Diagnosis present

## 2021-07-14 DIAGNOSIS — J441 Chronic obstructive pulmonary disease with (acute) exacerbation: Secondary | ICD-10-CM | POA: Diagnosis present

## 2021-07-14 DIAGNOSIS — Z794 Long term (current) use of insulin: Secondary | ICD-10-CM | POA: Diagnosis not present

## 2021-07-14 DIAGNOSIS — J9811 Atelectasis: Secondary | ICD-10-CM | POA: Diagnosis present

## 2021-07-14 DIAGNOSIS — M069 Rheumatoid arthritis, unspecified: Secondary | ICD-10-CM | POA: Diagnosis present

## 2021-07-14 DIAGNOSIS — F419 Anxiety disorder, unspecified: Secondary | ICD-10-CM | POA: Diagnosis present

## 2021-07-14 DIAGNOSIS — R000 Tachycardia, unspecified: Secondary | ICD-10-CM | POA: Diagnosis not present

## 2021-07-14 DIAGNOSIS — Z7951 Long term (current) use of inhaled steroids: Secondary | ICD-10-CM

## 2021-07-14 DIAGNOSIS — F259 Schizoaffective disorder, unspecified: Secondary | ICD-10-CM | POA: Diagnosis present

## 2021-07-14 DIAGNOSIS — J9602 Acute respiratory failure with hypercapnia: Secondary | ICD-10-CM | POA: Diagnosis not present

## 2021-07-14 DIAGNOSIS — T380X5A Adverse effect of glucocorticoids and synthetic analogues, initial encounter: Secondary | ICD-10-CM | POA: Diagnosis present

## 2021-07-14 DIAGNOSIS — I5032 Chronic diastolic (congestive) heart failure: Secondary | ICD-10-CM | POA: Diagnosis not present

## 2021-07-14 DIAGNOSIS — G4733 Obstructive sleep apnea (adult) (pediatric): Secondary | ICD-10-CM | POA: Diagnosis present

## 2021-07-14 DIAGNOSIS — Z720 Tobacco use: Secondary | ICD-10-CM | POA: Diagnosis present

## 2021-07-14 DIAGNOSIS — H1033 Unspecified acute conjunctivitis, bilateral: Secondary | ICD-10-CM | POA: Diagnosis present

## 2021-07-14 DIAGNOSIS — I10 Essential (primary) hypertension: Secondary | ICD-10-CM

## 2021-07-14 DIAGNOSIS — F250 Schizoaffective disorder, bipolar type: Secondary | ICD-10-CM | POA: Diagnosis present

## 2021-07-14 DIAGNOSIS — R5381 Other malaise: Secondary | ICD-10-CM | POA: Diagnosis present

## 2021-07-14 DIAGNOSIS — Z8711 Personal history of peptic ulcer disease: Secondary | ICD-10-CM

## 2021-07-14 DIAGNOSIS — Z79890 Hormone replacement therapy (postmenopausal): Secondary | ICD-10-CM

## 2021-07-14 HISTORY — DX: Pneumonia, unspecified organism: J18.9

## 2021-07-14 LAB — I-STAT ARTERIAL BLOOD GAS, ED
Acid-Base Excess: 7 mmol/L — ABNORMAL HIGH (ref 0.0–2.0)
Bicarbonate: 38.6 mmol/L — ABNORMAL HIGH (ref 20.0–28.0)
Calcium, Ion: 1.12 mmol/L — ABNORMAL LOW (ref 1.15–1.40)
HCT: 49 % (ref 39.0–52.0)
Hemoglobin: 16.7 g/dL (ref 13.0–17.0)
O2 Saturation: 89 %
Patient temperature: 97
Potassium: 3.6 mmol/L (ref 3.5–5.1)
Sodium: 142 mmol/L (ref 135–145)
TCO2: 41 mmol/L — ABNORMAL HIGH (ref 22–32)
pCO2 arterial: 80.5 mmHg (ref 32–48)
pH, Arterial: 7.284 — ABNORMAL LOW (ref 7.35–7.45)
pO2, Arterial: 65 mmHg — ABNORMAL LOW (ref 83–108)

## 2021-07-14 LAB — RAPID URINE DRUG SCREEN, HOSP PERFORMED
Amphetamines: NOT DETECTED
Barbiturates: NOT DETECTED
Benzodiazepines: NOT DETECTED
Cocaine: NOT DETECTED
Opiates: NOT DETECTED
Tetrahydrocannabinol: NOT DETECTED

## 2021-07-14 LAB — HEPATIC FUNCTION PANEL
ALT: 29 U/L (ref 0–44)
AST: 20 U/L (ref 15–41)
Albumin: 2.8 g/dL — ABNORMAL LOW (ref 3.5–5.0)
Alkaline Phosphatase: 77 U/L (ref 38–126)
Bilirubin, Direct: 0.2 mg/dL (ref 0.0–0.2)
Indirect Bilirubin: 0.4 mg/dL (ref 0.3–0.9)
Total Bilirubin: 0.6 mg/dL (ref 0.3–1.2)
Total Protein: 6 g/dL — ABNORMAL LOW (ref 6.5–8.1)

## 2021-07-14 LAB — LACTIC ACID, PLASMA
Lactic Acid, Venous: 2 mmol/L (ref 0.5–1.9)
Lactic Acid, Venous: 1.6 mmol/L (ref 0.5–1.9)

## 2021-07-14 LAB — BASIC METABOLIC PANEL
Anion gap: 13 (ref 5–15)
BUN: 13 mg/dL (ref 6–20)
CO2: 33 mmol/L — ABNORMAL HIGH (ref 22–32)
Calcium: 7.8 mg/dL — ABNORMAL LOW (ref 8.9–10.3)
Chloride: 99 mmol/L (ref 98–111)
Creatinine, Ser: 1.09 mg/dL (ref 0.61–1.24)
GFR, Estimated: >60 mL/min (ref >60)
Glucose, Bld: 151 mg/dL — ABNORMAL HIGH (ref 70–99)
Potassium: 3.9 mmol/L (ref 3.5–5.1)
Sodium: 145 mmol/L (ref 135–145)

## 2021-07-14 LAB — TROPONIN I (HIGH SENSITIVITY)
Troponin I (High Sensitivity): 24 ng/L — ABNORMAL HIGH (ref <18)
Troponin I (High Sensitivity): 22 ng/L — ABNORMAL HIGH (ref <18)

## 2021-07-14 LAB — CBC
HCT: 54.1 % — ABNORMAL HIGH (ref 39.0–52.0)
Hemoglobin: 15.6 g/dL (ref 13.0–17.0)
MCH: 26.8 pg (ref 26.0–34.0)
MCHC: 28.8 g/dL — ABNORMAL LOW (ref 30.0–36.0)
MCV: 92.8 fL (ref 80.0–100.0)
Platelets: 268 10*3/uL (ref 150–400)
RBC: 5.83 MIL/uL — ABNORMAL HIGH (ref 4.22–5.81)
RDW: 16.8 % — ABNORMAL HIGH (ref 11.5–15.5)
WBC: 12 10*3/uL — ABNORMAL HIGH (ref 4.0–10.5)
nRBC: 0 % (ref 0.0–0.2)

## 2021-07-14 LAB — CBG MONITORING, ED: Glucose-Capillary: 227 mg/dL — ABNORMAL HIGH (ref 70–99)

## 2021-07-14 LAB — RESP PANEL BY RT-PCR (FLU A&B, COVID) ARPGX2
Influenza A by PCR: NEGATIVE
Influenza B by PCR: NEGATIVE
SARS Coronavirus 2 by RT PCR: NEGATIVE

## 2021-07-14 LAB — BRAIN NATRIURETIC PEPTIDE: B Natriuretic Peptide: 132.1 pg/mL — ABNORMAL HIGH (ref 0.0–100.0)

## 2021-07-14 MED ORDER — GUAIFENESIN ER 600 MG PO TB12
600.0000 mg | ORAL_TABLET | Freq: Two times a day (BID) | ORAL | Status: DC
  Administered 2021-07-15 – 2021-07-26 (×22): 600 mg via ORAL
  Filled 2021-07-14 (×22): qty 1

## 2021-07-14 MED ORDER — ACETAMINOPHEN 325 MG PO TABS
650.0000 mg | ORAL_TABLET | Freq: Four times a day (QID) | ORAL | Status: DC | PRN
  Administered 2021-07-17 – 2021-07-24 (×15): 650 mg via ORAL
  Filled 2021-07-14 (×16): qty 2

## 2021-07-14 MED ORDER — SODIUM CHLORIDE 0.9 % IV SOLN
2.0000 g | INTRAVENOUS | Status: DC
  Administered 2021-07-15 – 2021-07-18 (×4): 2 g via INTRAVENOUS
  Filled 2021-07-14 (×4): qty 20

## 2021-07-14 MED ORDER — SODIUM CHLORIDE 0.9% FLUSH
3.0000 mL | INTRAVENOUS | Status: DC | PRN
Start: 2021-07-14 — End: 2021-07-26

## 2021-07-14 MED ORDER — IPRATROPIUM-ALBUTEROL 0.5-2.5 (3) MG/3ML IN SOLN
3.0000 mL | Freq: Four times a day (QID) | RESPIRATORY_TRACT | Status: AC
  Administered 2021-07-14 – 2021-07-15 (×2): 3 mL via RESPIRATORY_TRACT
  Filled 2021-07-14 (×2): qty 3

## 2021-07-14 MED ORDER — ACETAMINOPHEN 650 MG RE SUPP: 650.0000 mg | Freq: Four times a day (QID) | RECTAL | Status: DC | PRN

## 2021-07-14 MED ORDER — SODIUM CHLORIDE 0.9 % IV SOLN
2.0000 g | Freq: Once | INTRAVENOUS | Status: AC
  Administered 2021-07-14: 2 g via INTRAVENOUS
  Filled 2021-07-14: qty 20

## 2021-07-14 MED ORDER — ERYTHROMYCIN 5 MG/GM OP OINT
TOPICAL_OINTMENT | Freq: Four times a day (QID) | OPHTHALMIC | Status: AC
  Administered 2021-07-15 – 2021-07-18 (×2): 1 via OPHTHALMIC
  Filled 2021-07-14: qty 3.5

## 2021-07-14 MED ORDER — DOXYCYCLINE HYCLATE 100 MG PO TABS
100.0000 mg | ORAL_TABLET | Freq: Two times a day (BID) | ORAL | Status: DC
  Administered 2021-07-15 – 2021-07-19 (×8): 100 mg via ORAL
  Filled 2021-07-14 (×8): qty 1

## 2021-07-14 MED ORDER — SODIUM CHLORIDE 0.9% FLUSH
3.0000 mL | Freq: Two times a day (BID) | INTRAVENOUS | Status: DC
  Administered 2021-07-15 – 2021-07-25 (×21): 3 mL via INTRAVENOUS

## 2021-07-14 MED ORDER — FUROSEMIDE 10 MG/ML IJ SOLN
40.0000 mg | Freq: Two times a day (BID) | INTRAMUSCULAR | Status: DC
  Administered 2021-07-14: 40 mg via INTRAVENOUS
  Filled 2021-07-14: qty 4

## 2021-07-14 MED ORDER — NICOTINE 21 MG/24HR TD PT24
21.0000 mg | MEDICATED_PATCH | Freq: Every day | TRANSDERMAL | Status: DC
  Administered 2021-07-14 – 2021-07-26 (×13): 21 mg via TRANSDERMAL
  Filled 2021-07-14 (×13): qty 1

## 2021-07-14 MED ORDER — FUROSEMIDE 10 MG/ML IJ SOLN: 40.0000 mg | Freq: Two times a day (BID) | INTRAMUSCULAR | Status: DC

## 2021-07-14 MED ORDER — SODIUM CHLORIDE 0.9 % IV SOLN: 250.0000 mL | INTRAVENOUS | Status: DC | PRN

## 2021-07-14 MED ORDER — ALBUTEROL SULFATE (2.5 MG/3ML) 0.083% IN NEBU: 2.5000 mg | INHALATION_SOLUTION | Freq: Four times a day (QID) | RESPIRATORY_TRACT | Status: DC | PRN

## 2021-07-14 MED ORDER — FUROSEMIDE 10 MG/ML IJ SOLN
40.0000 mg | Freq: Once | INTRAMUSCULAR | Status: AC
Start: 2021-07-14 — End: 2021-07-14
  Administered 2021-07-14: 40 mg via INTRAVENOUS
  Filled 2021-07-14: qty 4

## 2021-07-14 MED ORDER — ENOXAPARIN SODIUM 100 MG/ML IJ SOSY
85.0000 mg | PREFILLED_SYRINGE | INTRAMUSCULAR | Status: DC
  Administered 2021-07-14 – 2021-07-17 (×4): 85 mg via SUBCUTANEOUS
  Filled 2021-07-14 (×4): qty 0.85

## 2021-07-14 MED ORDER — FAMOTIDINE 20 MG PO TABS
20.0000 mg | ORAL_TABLET | Freq: Two times a day (BID) | ORAL | Status: DC
  Administered 2021-07-15 – 2021-07-26 (×22): 20 mg via ORAL
  Filled 2021-07-14 (×22): qty 1

## 2021-07-14 MED ORDER — INSULIN ASPART 100 UNIT/ML IJ SOLN
0.0000 [IU] | Freq: Three times a day (TID) | INTRAMUSCULAR | Status: DC
  Administered 2021-07-15: 2 [IU] via SUBCUTANEOUS

## 2021-07-14 MED ORDER — SODIUM CHLORIDE 0.9 % IV SOLN
500.0000 mg | INTRAVENOUS | Status: DC
  Administered 2021-07-14: 500 mg via INTRAVENOUS
  Filled 2021-07-14: qty 5

## 2021-07-14 MED ORDER — FLUTICASONE FUROATE-VILANTEROL 200-25 MCG/ACT IN AEPB
1.0000 | INHALATION_SPRAY | Freq: Every day | RESPIRATORY_TRACT | Status: DC
  Filled 2021-07-14: qty 28

## 2021-07-14 NOTE — Assessment & Plan Note (Signed)
Social work has met with Mother/newborn, no barriers to discharge.  Resources provided.  

## 2021-07-14 NOTE — Assessment & Plan Note (Signed)
Hx of >7 days  Start erythromycin ointment

## 2021-07-14 NOTE — Assessment & Plan Note (Signed)
Check magnesium Continue telemetry Repeat ekg in AM Avoid qt prolonging drugs

## 2021-07-14 NOTE — ED Notes (Signed)
NRB removed, pt placed on 6L [place].

## 2021-07-14 NOTE — Assessment & Plan Note (Addendum)
Continue bipap at night, has not been using  Encourage weight loss

## 2021-07-14 NOTE — Assessment & Plan Note (Signed)
History of weight gain, LE edema, shortness of breath, orthopnea and mildly elevated bnp. CXR with stable vasculalrity, but small right effusion -received 40-mg lasix in ED. On 40mg  daily, will increase to Bid -strict I/O -daily weights  -last echo in 10/22 with normal EF and grade 1 DD -repeat echo

## 2021-07-14 NOTE — Assessment & Plan Note (Addendum)
-  presenting with acute respiratory failure, meeting sepsis criteria and findings on CXR concerning for pneumonia -continue rocephin and change azithromycin over to doxycyline with borderline prolonged qt  -was on outpatient levaquin -initial lactic acid elevated, continue to trend -check PCT  -covid/flu negative  -check RVP -check urinary antigens -sputum culture  -blood cx  -albuterol prn  -duonebs  -mucinex  -trend cbc

## 2021-07-14 NOTE — Assessment & Plan Note (Addendum)
A1C of 8.3 ni 5/23 Hold metformin Continue farxiga SSI and accuchecks qac/hs. Will do sensitive while NPO and then change to moderate when eating

## 2021-07-14 NOTE — Assessment & Plan Note (Signed)
Continue pepcid BID  

## 2021-07-14 NOTE — ED Triage Notes (Signed)
[facility] with [facility] X3 days. Pt room air saturation 77%, arrives on NRB with oxygen saturations of 99%.

## 2021-07-14 NOTE — Assessment & Plan Note (Signed)
No evidence of exacerbation with wheezing On abx for possible pneumonia Continue albuterol prn  Continue home inhalers: symbicort Smoking cessation encouraged

## 2021-07-14 NOTE — Assessment & Plan Note (Signed)
Well controlled.  Appears to be controlled off medication

## 2021-07-14 NOTE — Progress Notes (Signed)
Pt seen by Dr Delford Field  Still coughing, not bringing up much sputum. Breathing not much better w/ CPAP on than when up.   Eye is improved, but still draining.  Sinuses still tender, some greenish drainage.   Describes orthopnea and PND.   Wt 384.2, down a little. Wt Readings from Last 4 Encounters:  07/14/21 (!) 384 lb 3.2 oz (174.3 kg)  07/07/21 (!) 385 lb 9.6 oz (174.9 kg)  07/07/21 (!) 385 lb 6 oz (174.8 kg)  06/23/21 (!) 379 lb 9.6 oz (172.2 kg)     O2 sats 75% on room air.  RR 24.   Lungs w/ dense basilar rales, skin pale, lips cyanotic. 2+ LE edema.   Very incontinent of urine.   He got letters about his disability, not sure what they mean. Will ck on them later.  Plan:  Tx by EMS to ER, Triage called to let them know he's coming.  [name], PA-C 07/14/2021 2:15 PM

## 2021-07-14 NOTE — Assessment & Plan Note (Signed)
Continue Risperdal.

## 2021-07-14 NOTE — Assessment & Plan Note (Signed)
-  Nicotine patch 

## 2021-07-14 NOTE — Progress Notes (Signed)
Critical ABG results given to Dr. Particia Nearing. Verbal order received to place pt on BiPAP at this time.

## 2021-07-14 NOTE — Congregational Nurse Program (Signed)
Pt eyes are very swollen, fluid filled sac under R. Very diaphoretic Denies complaints Per dr Delford Field to [place] via ems

## 2021-07-14 NOTE — Assessment & Plan Note (Addendum)
Continue Cymbalta and hydroxyzine prn recommended per last inpatient psych consult

## 2021-07-14 NOTE — Assessment & Plan Note (Signed)
Continue bipap at night Encourage weight loss

## 2021-07-14 NOTE — Assessment & Plan Note (Addendum)
51 year old presenting with acute respiratory failure satting 75% on room air with increased work of breathing placed on bipap -admit to progressive -NPO -possible combination of pneumonia +acute on chornic heart failure + OHS and untreated OSA -continue bipap and repeat blood gas -treat pneumonia, check pct and trend lactic acid -continue diuresis -bipap at night  -wean as tolerated to maintain oxygenation >92%.

## 2021-07-14 NOTE — H&P (Signed)
History and Physical    Patient: Raymond Hood DGU:[id number] DOB: August 12, 1970 DOA: 07/14/2021 DOS: the patient was seen and examined on 07/14/2021 PCP: Storm Frisk, MD  Patient coming from: Homeless - no family    Chief Complaint: shortness of breath   HPI: Raymond Hood is a 51 y.o. male with medical history significant of COPD, depression and anxiety, T2DM, schizophrenia, GERD, HTN, OSA not compliant with bipap, and tobacco abuse who presented to ED with complaints of shortness of breath, cough and hypoxia. He is on bipap and sleepy with limited history. Most obtained from chart. He states he has been increasingly short of breath with productive cough with brown sputum and dyspnea on exertion x 1 week. He has also noticed his leg swelling with some weight gain and +orthopnea.   Symptoms started back at end of May and he was given steroids and antibiotics (cefdinir) and his lasix was increased at this time to 4x week. Given cefdinir and cipro eye drops on 6/14. Saw congregational nurse on 6/22 for shortness of breath and sweating. Oxygen at 86%.  On 6/28 weight up 6 pounds. Lasix increased to 40mg  BID x3 days then one tab daily  Given levaquin 500mg  x 5 days   Seen today and oxygen was 75% on room air and RR of 24. Ems was called.   He denies any fevers, chest pain, abdominal pain, N/V/D.   He smokes 1PDD and denies any alcohol use Denies drug use   ER Course:  vitals: temp: 97, bp: 130/62, HR: 104, RR: 28, oxygen: 100% on 15L NRB Pertinent labs: covid/flu negative, wbc: 12.0, CO2: 33, troponin 24>22 132, lactic acid: 2.0>1.6,  I stat arterial: ph: 7.284, pco2: 80 and oxygen: 65 CXR: bibasilar opacities, worse on the right with associated small right effusion concerning for basilar pneumonia. Stable vascularity.  In ED: given 40mg  IV lasix, Rocephin and azithromycin and started in bipap. TR[facility] was asked to admit.     Review of Systems: unable to review all systems due to the  inability of the patient to answer questions. Past Medical History:  Diagnosis Date   Bipolar 1 disorder, depressed (HCC)    COPD (chronic obstructive pulmonary disease) (HCC)    Depression with anxiety    Diabetes mellitus type II, non insulin dependent (HCC) 11/2020   A1c 7.3   Fall at home, initial encounter 05/28/2020   Last Assessment & Plan:  Formatting of this note might be different from the original. Acute onset, occurred few days ago.  Reports having a mechanical fall, fell forward injuring his left side including arm, knee.  Denies any vision changes, head trauma or loss of consciousness.  Patient is being referred to the emergency room for lower GI bleed, can also get evaluated for his injuries.   Gastric ulcer    when on NSAIDs   GERD (gastroesophageal reflux disease)    Helicobacter pylori antibody positive 07/04/2018   Formatting of this note might be different from the original. Started triple therapy 06/30/18.   Needs HP stool antigen in 8 weeks,  Off PPI for 10 days.   HTN (hypertension)    OSA (obstructive sleep apnea)    not currently on CPAP   Rheumatoid arthritis (HCC)    Right kidney mass, 2.8 cm 11/2020   Tobacco abuse    Past Surgical History:  Procedure Laterality Date   left index finger surgery Left    Social History:  reports that he has been smoking cigarettes. He  has been smoking an average of 1 pack per day. He has never used smokeless tobacco. He reports that he does not currently use alcohol. He reports that he does not use drugs.  No Known Allergies  Family History  Problem Relation Age of Onset   Schizophrenia Mother    Diabetes Mellitus II Father     Prior to Admission medications   Medication Sig Start Date End Date Taking? Authorizing Provider  albuterol (VENTOLIN HFA) 108 (90 Base) MCG/ACT inhaler Inhale 2 puffs into the lungs every 6  hours as needed for wheezing or shortness of breath. 01/20/21   [name], MD  atorvastatin  (LIPITOR) 10 MG tablet Take 1 tablet (10 mg total) by mouth once daily. 06/24/21   [name], MD  budesonide-formoterol [facility]) 160-4.5 MCG/ACT inhaler Inhale 2 puffs into the lungs 2 (two) times daily. 03/24/21   Elodia Florence., MD  ciprofloxacin (CILOXAN) 0.3 % ophthalmic solution Place 1 drop into the right eye every 4 (four) hours while awake. 06/23/21   [name], MD  dapagliflozin propanediol (FARXIGA) 10 MG TABS tablet Take 1 tablet (10 mg total) by mouth daily. 07/09/21   [name], MD  dapagliflozin propanediol (FARXIGA) 5 MG TABS tablet Take 2 tablets (10 mg total) by mouth daily. 07/08/21   [name], MD  DULoxetine (CYMBALTA) 60 MG capsule Take 1 capsule (60 mg total) by mouth daily. 06/24/21   [name], MD  famotidine (PEPCID) 20 MG tablet Take 1 tablet (20 mg total) by mouth 2 (two) times daily. 06/24/21   [name], MD  fluticasone (FLONASE) 50 MCG/ACT nasal spray Place 2 sprays into both nostrils daily. 01/13/21   [name], MD  furosemide (LASIX) 40 MG tablet Take 1 tablet (40 mg total) by mouth 2 (two) times daily. For 3 days then one daily 07/07/21   [name], MD  gabapentin (NEURONTIN) 300 MG capsule Take 2 capsules in the morning AND 1 capsule midday AND 2 capsules every evening. 06/10/21   [name], MD  hydrOXYzine (ATARAX) 25 MG tablet Take 1 tablet (25 mg total) by mouth 3 (three) times daily as needed. 06/24/21   [name], MD  levofloxacin (LEVAQUIN) 500 MG tablet Take 1 tablet (500 mg total) by mouth daily. 07/07/21   [name], MD  metFORMIN (GLUCOPHAGE) 500 MG tablet Take 2 tablets (1,000 mg total) by mouth 2 (two) times daily with a meal. 06/10/21   [name], MD  Multiple Vitamins-Minerals (PRESERVISION AREDS 2) CAPS Take 1 capsule by mouth 2 (two) times daily with food. Take 1 in the morning and 1 in the evening. 04/29/21   [name], MD  olopatadine (PATANOL) 0.1 %  ophthalmic solution Place 1 drop into the right eye 2 (two) times daily. 03/25/21   [name], MD  risperidone (RISPERDAL) 4 MG tablet Take 1 tablet (4 mg total) by mouth once daily. 06/10/21   [name], MD  Spacer/Aero-Holding Chambers (AEROCHAMBER PLUS WITH MASK) inhaler Use as directed 01/20/21   [name], MD  tamsulosin (FLOMAX) 0.4 MG CAPS capsule Take 1 capsule (0.4 mg total) by mouth daily. 06/10/21   [name], MD    Physical Exam: Vitals:   07/14/21 1515 07/14/21 1615 07/14/21 1630 07/14/21 1914  BP: 133/80  122/84   Pulse: (!) 103 (!) 101 96 96  Resp: (!) 25 20 (!) 21 18  Temp:      TempSrc:      SpO2: 100% 96% 96% 97%   General:  Appears calm and comfortable and is in NAD. On bipap and sleepy.  Eyes:  PERRL, EOMI, normal lids, iris. Has thick white bilateral eye discharge.  ENT:  grossly normal hearing, lips & tongue, mmm; can not see dentition Neck:  no LAD, masses or thyromegaly; no carotid bruits Cardiovascular:  RRR, no m/r/g. +BLE edema Respiratory:   course breath sounds, no wheezing, bipap noise. No respiratory distress on bipap  Abdomen:  soft, obese, NT, ND, NABS Back:   normal alignment, no CVAT Skin:  no rash or induration seen on limited exam Musculoskeletal:  grossly normal tone BUE/BLE, good ROM, no bony abnormality Lower extremity:   Limited foot exam with no ulcerations.  2+ distal pulses. Psychiatric:  grossly normal mood and affect, speech fluent and appropriate, AOx3 Neurologic:  CN 2-12 grossly intact, moves all extremities in coordinated fashion, sensation intact   Radiological Exams on Admission: Independently reviewed - see discussion in A/P where applicable  DG Chest Portable 1 View  Result Date: 07/14/2021 CLINICAL DATA:  Shortness of breath, history COPD, smoker, hypertension diabetes EXAM: PORTABLE CHEST 1 VIEW COMPARISON:  01/13/2021 FINDINGS: Limited exam because of body habitus and portable technique. Patchy  bibasilar opacities worse on the right, obscuring the right hemidiaphragm concerning for basilar pneumonia. Small effusion on the right not excluded. Heart is enlarged. No pneumothorax. Trachea midline. IMPRESSION: Bibasilar opacities, worse on the right with associated small right effusion concerning for basilar pneumonia. Stable heart size and vascularity. Electronically Signed   By: Jerilynn Mages.  Shick M.D.   On: 07/14/2021 15:26    EKG: Independently reviewed.  Sinus tachycardia with rate 106; nonspecific ST changes with no evidence of acute ischemia. Borderline prolonged qt    Labs on Admission: I have personally reviewed the available labs and imaging studies at the time of the admission.  Pertinent labs:   covid/flu negative,  wbc: 12.0, CO2: 33,  troponin 24>22 lactic acid: 2.0>1.6,  BNP: 132  I stat arterial: ph: 7.284, pco2: 80 and oxygen: 65  Assessment and Plan: Principal Problem:   Acute on chronic respiratory failure with hypoxia (HCC) Active Problems:   Sepsis from pneumonia    Acute on chronic diastolic CHF (congestive heart failure) (HCC)   COPD with chronic bronchitis (HCC)   Conjunctivitis   Prolonged QT interval   Type 2 diabetes mellitus with hyperglycemia (HCC)   Essential hypertension   GERD (gastroesophageal reflux disease)   Schizo affective schizophrenia (HCC)   Anxiety and depression   OSA/OHS   Tobacco abuse   Homeless   CAP (community acquired pneumonia)    Assessment and Plan: * Acute on chronic respiratory failure with hypoxia ([place]) 51 year old presenting with acute respiratory failure satting 75% on room air with increased work of breathing placed on bipap -admit to progressive -NPO -possible combination of pneumonia +acute on chornic heart failure + OHS and untreated OSA -continue bipap and repeat blood gas -treat pneumonia, check pct and trend lactic acid -continue diuresis -bipap at night  -wean as tolerated to maintain oxygenation >92%.    Sepsis from pneumonia  -presenting with acute respiratory failure, meeting sepsis criteria and findings on CXR concerning for pneumonia -continue rocephin and change azithromycin over to doxycyline with borderline prolonged qt  -was on outpatient levaquin -initial lactic acid elevated, continue to trend -check PCT  -covid/flu negative  -check RVP -check urinary antigens -  sputum culture  -blood cx  -albuterol prn  -duonebs  -mucinex  -trend cbc   Acute on chronic diastolic CHF (congestive heart failure) (HCC) History of weight gain, LE edema, shortness of breath, orthopnea and mildly elevated bnp. CXR with stable vasculalrity, but small right effusion -received 40-mg lasix in ED. On 40mg  daily, will increase to Bid -strict I/O -daily weights  -last echo in 10/22 with normal EF and grade 1 DD -repeat echo    COPD with chronic bronchitis (HCC) No evidence of exacerbation with wheezing On abx for possible pneumonia Continue albuterol prn  Continue home inhalers: symbicort Smoking cessation encouraged   Conjunctivitis Hx of >7 days  Start erythromycin ointment   Prolonged QT interval Check magnesium Continue telemetry Repeat ekg in AM Avoid qt prolonging drugs   Type 2 diabetes mellitus with hyperglycemia (HCC) A1C of 8.3 ni 5/23 Hold metformin Continue farxiga SSI and accuchecks qac/hs. Will do sensitive while NPO and then change to moderate when eating   Essential hypertension Well controlled.  Appears to be controlled off medication   GERD (gastroesophageal reflux disease) Continue pepcid BID  Schizo affective schizophrenia (HCC) Continue Risperdal   Anxiety and depression Continue Cymbalta and hydroxyzine prn recommended per last inpatient psych consult   OSA/OHS Continue bipap at night, has not been using  Encourage weight loss   Tobacco abuse Nicotine patch   Homeless Social work consult     Advance Care Planning:   Code Status: Full  Code   Consults: SW  DVT Prophylaxis: lovenox   Family Communication: none  Severity of Illness: The appropriate patient status for this patient is INPATIENT. Inpatient status is judged to be reasonable and necessary in order to provide the required intensity of service to ensure the patient's safety. The patient's presenting symptoms, physical exam findings, and initial radiographic and laboratory data in the context of their chronic comorbidities is felt to place them at high risk for further clinical deterioration. Furthermore, it is not anticipated that the patient will be medically stable for discharge from the hospital within 2 midnights of admission.   * I certify that at the point of admission it is my clinical judgment that the patient will require inpatient hospital care spanning beyond 2 midnights from the point of admission due to high intensity of service, high risk for further deterioration and high frequency of surveillance required.*  Author: 6/23, MD 07/14/2021 7:41 PM  For on call review [email or web address].

## 2021-07-14 NOTE — ED Provider Notes (Signed)
[facility] EMERGENCY DEPARTMENT Provider Note   CSN: [id number] Arrival date & time: 07/14/21  1440     History  Chief Complaint  Patient presents with   Shortness of Breath    Raymond Hood is a 51 y.o. male.  Pt is a 51 yo male with a pmhx significant for COPD, bipolar d/o, schizoaffective d/o, OSA on bipap, chf, dm2, and homeless status.   He has been seeing the congressional nurse at the shelter for sob for the past few days.  He has been on levaquin and his lasix dose has been increased.  [name] O2 sat was 75% on RA at the shelter, so EMS was called.  Pt was put on a 100% NRB and brought here.  Notes report several pound weight gain.       Home Medications Prior to Admission medications   Medication Sig Start Date End Date Taking? Authorizing Provider  albuterol (VENTOLIN HFA) 108 (90 Base) MCG/ACT inhaler Inhale 2 puffs into the lungs every 6  hours as needed for wheezing or shortness of breath. 01/20/21   Storm Frisk, MD  atorvastatin (LIPITOR) 10 MG tablet Take 1 tablet (10 mg total) by mouth once daily. 06/24/21   Storm Frisk, MD  budesonide-formoterol [facility]) 160-4.5 MCG/ACT inhaler Inhale 2 puffs into the lungs 2 (two) times daily. 03/24/21   Zigmund Daniel., MD  ciprofloxacin (CILOXAN) 0.3 % ophthalmic solution Place 1 drop into the right eye every 4 (four) hours while awake. 06/23/21   Storm Frisk, MD  dapagliflozin propanediol (FARXIGA) 10 MG TABS tablet Take 1 tablet (10 mg total) by mouth daily. 07/09/21   Storm Frisk, MD  dapagliflozin propanediol (FARXIGA) 5 MG TABS tablet Take 2 tablets (10 mg total) by mouth daily. 07/08/21   Storm Frisk, MD  DULoxetine (CYMBALTA) 60 MG capsule Take 1 capsule (60 mg total) by mouth daily. 06/24/21   Storm Frisk, MD  famotidine (PEPCID) 20 MG tablet Take 1 tablet (20 mg total) by mouth 2 (two) times daily. 06/24/21   Storm Frisk, MD  fluticasone (FLONASE) 50 MCG/ACT  nasal spray Place 2 sprays into both nostrils daily. 01/13/21   Storm Frisk, MD  furosemide (LASIX) 40 MG tablet Take 1 tablet (40 mg total) by mouth 2 (two) times daily. For 3 days then one daily 07/07/21   Storm Frisk, MD  gabapentin (NEURONTIN) 300 MG capsule Take 2 capsules in the morning AND 1 capsule midday AND 2 capsules every evening. 06/10/21   Storm Frisk, MD  hydrOXYzine (ATARAX) 25 MG tablet Take 1 tablet (25 mg total) by mouth 3 (three) times daily as needed. 06/24/21   Storm Frisk, MD  levofloxacin (LEVAQUIN) 500 MG tablet Take 1 tablet (500 mg total) by mouth daily. 07/07/21   Storm Frisk, MD  metFORMIN (GLUCOPHAGE) 500 MG tablet Take 2 tablets (1,000 mg total) by mouth 2 (two) times daily with a meal. 06/10/21   Storm Frisk, MD  Multiple Vitamins-Minerals (PRESERVISION AREDS 2) CAPS Take 1 capsule by mouth 2 (two) times daily with food. Take 1 in the morning and 1 in the evening. 04/29/21   Storm Frisk, MD  olopatadine (PATANOL) 0.1 % ophthalmic solution Place 1 drop into the right eye 2 (two) times daily. 03/25/21   Storm Frisk, MD  risperidone (RISPERDAL) 4 MG tablet Take 1 tablet (4 mg total) by mouth once daily. 06/10/21   Delford Field,  Charlcie Cradle, MD  Spacer/Aero-Holding Chambers (AEROCHAMBER PLUS WITH MASK) inhaler Use as directed 01/20/21   Storm Frisk, MD  tamsulosin [facility]) 0.4 MG CAPS capsule Take 1 capsule (0.4 mg total) by mouth daily. 06/10/21   Storm Frisk, MD      Allergies    Patient has no known allergies.    Review of Systems   Review of Systems  Respiratory:  Positive for cough and shortness of breath.   All other systems reviewed and are negative.   Physical Exam Updated Vital Signs BP 122/84   Pulse 96   Temp (!) 97 F (36.1 C) (Temporal)   Resp (!) 21   SpO2 96%  Physical Exam Vitals and nursing note reviewed.  Constitutional:      General: He is in acute distress.     Appearance: He is well-developed. He is  obese. He is ill-appearing.  HENT:     Head: Normocephalic and atraumatic.     Mouth/Throat:     Mouth: Mucous membranes are moist.     Pharynx: Oropharynx is clear.     Comments: Poor dentition Eyes:     Extraocular Movements: Extraocular movements intact.     Pupils: Pupils are equal, round, and reactive to light.  Cardiovascular:     Rate and Rhythm: Regular rhythm. Tachycardia present.  Pulmonary:     Effort: Tachypnea present.     Breath sounds: Rhonchi present.  Abdominal:     General: Bowel sounds are normal.     Palpations: Abdomen is soft.  Musculoskeletal:     Cervical back: Normal range of motion and neck supple.     Right lower leg: Edema present.     Left lower leg: Edema present.  Skin:    General: Skin is warm and dry.     Capillary Refill: Capillary refill takes less than 2 seconds.  Neurological:     General: No focal deficit present.     Mental Status: He is alert and oriented to person, place, and time.  Psychiatric:        Mood and Affect: Mood normal.        Behavior: Behavior normal.     ED Results / Procedures / Treatments   Labs (all labs ordered are listed, but only abnormal results are displayed) Labs Reviewed  BASIC METABOLIC PANEL - Abnormal; Notable for the following components:      Result Value   CO2 33 (*)    Glucose, Bld 151 (*)    Calcium 7.8 (*)    All other components within normal limits  CBC - Abnormal; Notable for the following components:   WBC 12.0 (*)    RBC 5.83 (*)    HCT 54.1 (*)    MCHC 28.8 (*)    RDW 16.8 (*)    All other components within normal limits  BRAIN NATRIURETIC PEPTIDE - Abnormal; Notable for the following components:   B Natriuretic Peptide 132.1 (*)    All other components within normal limits  LACTIC ACID, PLASMA - Abnormal; Notable for the following components:   Lactic Acid, Venous 2.0 (*)    All other components within normal limits  HEPATIC FUNCTION PANEL - Abnormal; Notable for the following  components:   Total Protein 6.0 (*)    Albumin 2.8 (*)    All other components within normal limits  I-STAT ARTERIAL BLOOD GAS, ED - Abnormal; Notable for the following components:   pH, Arterial 7.284 (*)  pCO2 arterial 80.5 (*)    pO2, Arterial 65 (*)    Bicarbonate 38.6 (*)    TCO2 41 (*)    Acid-Base Excess 7.0 (*)    Calcium, Ion 1.12 (*)    All other components within normal limits  TROPONIN I (HIGH SENSITIVITY) - Abnormal; Notable for the following components:   Troponin I (High Sensitivity) 24 (*)    All other components within normal limits  RESP PANEL BY RT-PCR (FLU A&B, COVID) ARPGX2  CULTURE, BLOOD (ROUTINE X 2)  CULTURE, BLOOD (ROUTINE X 2)  LACTIC ACID, PLASMA  PROCALCITONIN  PROCALCITONIN  TROPONIN I (HIGH SENSITIVITY)    EKG EKG Interpretation  Date/Time:  Wednesday July 14 2021 14:48:13 EDT Ventricular Rate:  106 PR Interval:  92 QRS Duration: 85 QT Interval:  362 QTC Calculation: 481 R Axis:   40 Text Interpretation: Sinus tachycardia Low voltage, precordial leads Borderline T abnormalities, anterior leads Borderline prolonged QT interval No significant change since last tracing Confirmed by Jacalyn Lefevre [phone]) on 07/14/2021 3:17:26 PM  Radiology DG Chest Portable 1 View  Result Date: 07/14/2021 CLINICAL DATA:  Shortness of breath, history COPD, smoker, hypertension diabetes EXAM: PORTABLE CHEST 1 VIEW COMPARISON:  01/13/2021 FINDINGS: Limited exam because of body habitus and portable technique. Patchy bibasilar opacities worse on the right, obscuring the right hemidiaphragm concerning for basilar pneumonia. Small effusion on the right not excluded. Heart is enlarged. No pneumothorax. Trachea midline. IMPRESSION: Bibasilar opacities, worse on the right with associated small right effusion concerning for basilar pneumonia. Stable heart size and vascularity. Electronically Signed   By: Judie Petit.  Shick M.D.   On: 07/14/2021 15:26    Procedures Procedures     Medications Ordered in ED Medications  azithromycin (ZITHROMAX) 500 mg in sodium chloride 0.9 % 250 mL IVPB (500 mg Intravenous New Bag/Given 07/14/21 1708)  cefTRIAXone (ROCEPHIN) 2 g in sodium chloride 0.9 % 100 mL IVPB (0 g Intravenous Stopped 07/14/21 1702)  furosemide (LASIX) injection 40 mg (40 mg Intravenous Given 07/14/21 1631)    ED Course/ Medical Decision Making/ A&P                           Medical Decision Making Amount and/or Complexity of Data Reviewed Labs: ordered. Radiology: ordered.  Risk Prescription drug management. Decision regarding hospitalization.   This patient presents to the ED for concern of sob, this involves an extensive number of treatment options, and is a complaint that carries with it a high risk of complications and morbidity.  The differential diagnosis includes pna, covid, uri, copd exac, bronchitis, chf exac   Co morbidities that complicate the patient evaluation  COPD, bipolar d/o, schizoaffective d/o, OSA on bipap, chf, dm2, and homeless status   Additional history obtained:  Additional history obtained from epic chart review External records from outside source obtained and reviewed including EMS report   Lab Tests:  I Ordered, and personally interpreted labs.  The pertinent results include:  covid/flu neg; ph 7.28 and pCO2 80.5; pO2 65; trop 24; cbc with wbc 12.0   Imaging Studies ordered:  I ordered imaging studies including cxr  I independently visualized and interpreted imaging which showed  IMPRESSION:  Bibasilar opacities, worse on the right with associated small right  effusion concerning for basilar pneumonia. Stable heart size and  vascularity.   I agree with the radiologist interpretation   Cardiac Monitoring:  The patient was maintained on a cardiac monitor.  I  personally viewed and interpreted the cardiac monitored which showed an underlying rhythm of: sinus tachy initially; now nsr   Medicines ordered and  prescription drug management:  I ordered medication including rocephin/zithromax  for pna and lasix for chf  Reevaluation of the patient after these medicines showed that the patient improved I have reviewed the patients home medicines and have made adjustments as needed   Test Considered:  Cta   Critical Interventions:  bipap   Consultations Obtained:  I requested consultation with the hospitalist (Dr. Artis Flock),  and discussed lab and imaging findings as well as pertinent plan -she will admit   Problem List / ED Course:  Resp failure (hypoxia and hypercarbia).  Pt on bipap and is more alert. Possible pna:  pt given rocephin/zithromax  CHF:  lasix given iv with good diuresis   Reevaluation:  After the interventions noted above, I reevaluated the patient and found that they have :improved   Social Determinants of Health:  Homeless.  Lives at the shelter.   Dispostion:  After consideration of the diagnostic results and the patients response to treatment, I feel that the patent would benefit from admission.    CRITICAL CARE Performed by: Jacalyn Lefevre   Total critical care time: 30 minutes  Critical care time was exclusive of separately billable procedures and treating other patients.  Critical care was necessary to treat or prevent imminent or life-threatening deterioration.  Critical care was time spent personally by me on the following activities: development of treatment plan with patient and/or surrogate as well as nursing, discussions with consultants, evaluation of patient's response to treatment, examination of patient, obtaining history from patient or surrogate, ordering and performing treatments and interventions, ordering and review of laboratory studies, ordering and review of radiographic studies, pulse oximetry and re-evaluation of patient's condition.         Final Clinical Impression(s) / ED Diagnoses Final diagnoses:  COPD exacerbation  (HCC)  Acute respiratory failure with hypoxia and hypercapnia (HCC)    Rx / DC Orders ED Discharge Orders     None         Jacalyn Lefevre, MD 07/14/21 [phone]

## 2021-07-15 ENCOUNTER — Inpatient Hospital Stay (HOSPITAL_COMMUNITY): Payer: Medicaid Other

## 2021-07-15 DIAGNOSIS — J9602 Acute respiratory failure with hypercapnia: Secondary | ICD-10-CM

## 2021-07-15 DIAGNOSIS — I5033 Acute on chronic diastolic (congestive) heart failure: Secondary | ICD-10-CM

## 2021-07-15 DIAGNOSIS — F419 Anxiety disorder, unspecified: Secondary | ICD-10-CM

## 2021-07-15 DIAGNOSIS — F32A Depression, unspecified: Secondary | ICD-10-CM

## 2021-07-15 DIAGNOSIS — J9601 Acute respiratory failure with hypoxia: Secondary | ICD-10-CM

## 2021-07-15 DIAGNOSIS — J189 Pneumonia, unspecified organism: Secondary | ICD-10-CM

## 2021-07-15 LAB — ECHOCARDIOGRAM COMPLETE
AR max vel: 2.89 cm2
AV Peak grad: 7.3 mmHg
Ao pk vel: 1.35 m/s
Area-P 1/2: 3.95 cm2
Calc EF: 56.4 %
S' Lateral: 4 cm
Single Plane A2C EF: 57.2 %
Single Plane A4C EF: 58.3 %
Weight: 5989.46 oz

## 2021-07-15 LAB — STREP PNEUMONIAE URINARY ANTIGEN: Strep Pneumo Urinary Antigen: NEGATIVE

## 2021-07-15 LAB — RESPIRATORY PANEL BY PCR

## 2021-07-15 LAB — CBC
HCT: 53.2 % — ABNORMAL HIGH (ref 39.0–52.0)
Hemoglobin: 15.5 g/dL (ref 13.0–17.0)
MCH: 26.6 pg (ref 26.0–34.0)
MCHC: 29.1 g/dL — ABNORMAL LOW (ref 30.0–36.0)
MCV: 91.4 fL (ref 80.0–100.0)
Platelets: 221 10*3/uL (ref 150–400)
RBC: 5.82 MIL/uL — ABNORMAL HIGH (ref 4.22–5.81)
RDW: 16.7 % — ABNORMAL HIGH (ref 11.5–15.5)
WBC: 13.1 10*3/uL — ABNORMAL HIGH (ref 4.0–10.5)
nRBC: 0 % (ref 0.0–0.2)

## 2021-07-15 LAB — BASIC METABOLIC PANEL
Anion gap: 10 (ref 5–15)
BUN: 11 mg/dL (ref 6–20)
CO2: 37 mmol/L — ABNORMAL HIGH (ref 22–32)
Calcium: 7.7 mg/dL — ABNORMAL LOW (ref 8.9–10.3)
Chloride: 99 mmol/L (ref 98–111)
Creatinine, Ser: 1.01 mg/dL (ref 0.61–1.24)
GFR, Estimated: >60 mL/min (ref >60)
Glucose, Bld: 157 mg/dL — ABNORMAL HIGH (ref 70–99)
Potassium: 3.9 mmol/L (ref 3.5–5.1)
Sodium: 146 mmol/L — ABNORMAL HIGH (ref 135–145)

## 2021-07-15 LAB — BLOOD GAS, VENOUS
Acid-Base Excess: 15.2 mmol/L — ABNORMAL HIGH (ref 0.0–2.0)
Bicarbonate: 44.6 mmol/L — ABNORMAL HIGH (ref 20.0–28.0)
Drawn by: 63183
O2 Saturation: 91 %
Patient temperature: 37.1
pCO2, Ven: 79 mmHg (ref 44–60)
pH, Ven: 7.36 (ref 7.25–7.43)
pO2, Ven: 61 mmHg — ABNORMAL HIGH (ref 32–45)

## 2021-07-15 LAB — I-STAT VENOUS BLOOD GAS, ED
Acid-Base Excess: 14 mmol/L — ABNORMAL HIGH (ref 0.0–2.0)
Bicarbonate: 42.9 mmol/L — ABNORMAL HIGH (ref 20.0–28.0)
Calcium, Ion: 0.87 mmol/L — CL (ref 1.15–1.40)
HCT: 50 % (ref 39.0–52.0)
Hemoglobin: 17 g/dL (ref 13.0–17.0)
O2 Saturation: 99 %
Potassium: 3.9 mmol/L (ref 3.5–5.1)
Sodium: 143 mmol/L (ref 135–145)
TCO2: 45 mmol/L — ABNORMAL HIGH (ref 22–32)
pCO2, Ven: 69 mmHg — ABNORMAL HIGH (ref 44–60)
pH, Ven: 7.402 (ref 7.25–7.43)
pO2, Ven: 164 mmHg — ABNORMAL HIGH (ref 32–45)

## 2021-07-15 LAB — GLUCOSE, CAPILLARY
Glucose-Capillary: 171 mg/dL — ABNORMAL HIGH (ref 70–99)
Glucose-Capillary: 249 mg/dL — ABNORMAL HIGH (ref 70–99)
Glucose-Capillary: 259 mg/dL — ABNORMAL HIGH (ref 70–99)
Glucose-Capillary: 224 mg/dL — ABNORMAL HIGH (ref 70–99)

## 2021-07-15 LAB — PROCALCITONIN: Procalcitonin: <0.1 ng/mL

## 2021-07-15 LAB — MAGNESIUM: Magnesium: 2.2 mg/dL (ref 1.7–2.4)

## 2021-07-15 MED ORDER — TAMSULOSIN HCL 0.4 MG PO CAPS
0.4000 mg | ORAL_CAPSULE | Freq: Every day | ORAL | Status: DC
  Administered 2021-07-15 – 2021-07-26 (×12): 0.4 mg via ORAL
  Filled 2021-07-15 (×12): qty 1

## 2021-07-15 MED ORDER — PERFLUTREN LIPID MICROSPHERE
1.0000 mL | INTRAVENOUS | Status: AC | PRN
  Administered 2021-07-15: 2 mL via INTRAVENOUS

## 2021-07-15 MED ORDER — REVEFENACIN 175 MCG/3ML IN SOLN
175.0000 ug | Freq: Every day | RESPIRATORY_TRACT | Status: DC
Start: 2021-07-15 — End: 2021-07-15
  Filled 2021-07-15: qty 3

## 2021-07-15 MED ORDER — RISPERIDONE 2 MG PO TABS
4.0000 mg | ORAL_TABLET | Freq: Every day | ORAL | Status: DC
  Administered 2021-07-15 – 2021-07-26 (×12): 4 mg via ORAL
  Filled 2021-07-15 (×12): qty 2

## 2021-07-15 MED ORDER — IPRATROPIUM-ALBUTEROL 0.5-2.5 (3) MG/3ML IN SOLN
3.0000 mL | Freq: Four times a day (QID) | RESPIRATORY_TRACT | Status: DC
Start: 2021-07-15 — End: 2021-07-15

## 2021-07-15 MED ORDER — ATORVASTATIN CALCIUM 10 MG PO TABS
10.0000 mg | ORAL_TABLET | Freq: Every day | ORAL | Status: DC
  Administered 2021-07-15 – 2021-07-26 (×12): 10 mg via ORAL
  Filled 2021-07-15 (×12): qty 1

## 2021-07-15 MED ORDER — IPRATROPIUM-ALBUTEROL 0.5-2.5 (3) MG/3ML IN SOLN
3.0000 mL | Freq: Four times a day (QID) | RESPIRATORY_TRACT | Status: DC
  Administered 2021-07-15 – 2021-07-18 (×13): 3 mL via RESPIRATORY_TRACT
  Filled 2021-07-15 (×13): qty 3

## 2021-07-15 MED ORDER — ARFORMOTEROL TARTRATE 15 MCG/2ML IN NEBU: 15.0000 ug | INHALATION_SOLUTION | Freq: Two times a day (BID) | RESPIRATORY_TRACT | Status: DC

## 2021-07-15 MED ORDER — FLUTICASONE PROPIONATE 50 MCG/ACT NA SUSP
2.0000 | Freq: Every day | NASAL | Status: DC
  Administered 2021-07-16 – 2021-07-26 (×10): 2 via NASAL
  Filled 2021-07-15: qty 16

## 2021-07-15 MED ORDER — BUDESONIDE 0.25 MG/2ML IN SUSP
0.2500 mg | Freq: Two times a day (BID) | RESPIRATORY_TRACT | Status: DC
  Administered 2021-07-15 – 2021-07-26 (×23): 0.25 mg via RESPIRATORY_TRACT
  Filled 2021-07-15 (×23): qty 2

## 2021-07-15 MED ORDER — FUROSEMIDE 10 MG/ML IJ SOLN
60.0000 mg | Freq: Two times a day (BID) | INTRAMUSCULAR | Status: DC
  Administered 2021-07-15 (×2): 60 mg via INTRAVENOUS
  Filled 2021-07-15 (×2): qty 6

## 2021-07-15 MED ORDER — ALBUTEROL SULFATE (2.5 MG/3ML) 0.083% IN NEBU
2.5000 mg | INHALATION_SOLUTION | RESPIRATORY_TRACT | Status: DC | PRN
Start: 2021-07-15 — End: 2021-07-26
  Filled 2021-07-15: qty 3

## 2021-07-15 MED ORDER — METHYLPREDNISOLONE SODIUM SUCC 40 MG IJ SOLR
40.0000 mg | Freq: Two times a day (BID) | INTRAMUSCULAR | Status: DC
  Administered 2021-07-15 – 2021-07-17 (×6): 40 mg via INTRAVENOUS
  Filled 2021-07-15 (×6): qty 1

## 2021-07-15 MED ORDER — INSULIN ASPART 100 UNIT/ML IJ SOLN
0.0000 [IU] | Freq: Three times a day (TID) | INTRAMUSCULAR | Status: DC
  Administered 2021-07-15: 11 [IU] via SUBCUTANEOUS
  Administered 2021-07-15 – 2021-07-16 (×2): 7 [IU] via SUBCUTANEOUS
  Administered 2021-07-16: 20 [IU] via SUBCUTANEOUS
  Administered 2021-07-16: 7 [IU] via SUBCUTANEOUS
  Administered 2021-07-17: 20 [IU] via SUBCUTANEOUS
  Administered 2021-07-17: 15 [IU] via SUBCUTANEOUS
  Administered 2021-07-17: 7 [IU] via SUBCUTANEOUS

## 2021-07-15 MED ORDER — DULOXETINE HCL 60 MG PO CPEP
60.0000 mg | ORAL_CAPSULE | Freq: Every day | ORAL | Status: DC
  Administered 2021-07-15 – 2021-07-26 (×12): 60 mg via ORAL
  Filled 2021-07-15 (×12): qty 1

## 2021-07-15 NOTE — Progress Notes (Signed)
Unable to complete admission documentation due to patient neurological status at this time. Will try again later.

## 2021-07-15 NOTE — Plan of Care (Signed)
No acute events overnight.    Problem: Education: Goal: Ability to describe self-care measures that may prevent or decrease complications (Diabetes Survival Skills Education) will improve Outcome: Progressing Goal: Individualized Educational Video(s) Outcome: Progressing   Problem: Coping: Goal: Ability to adjust to condition or change in health will improve Outcome: Progressing   Problem: Fluid Volume: Goal: Ability to maintain a balanced intake and output will improve Outcome: Progressing   Problem: Health Behavior/Discharge Planning: Goal: Ability to identify and utilize available resources and services will improve Outcome: Progressing Goal: Ability to manage health-related needs will improve Outcome: Progressing   Problem: Metabolic: Goal: Ability to maintain appropriate glucose levels will improve Outcome: Progressing   Problem: Nutritional: Goal: Maintenance of adequate nutrition will improve Outcome: Progressing Goal: Progress toward achieving an optimal weight will improve Outcome: Progressing   Problem: Skin Integrity: Goal: Risk for impaired skin integrity will decrease Outcome: Progressing   Problem: Tissue Perfusion: Goal: Adequacy of tissue perfusion will improve Outcome: Progressing   Problem: Education: Goal: Ability to demonstrate management of disease process will improve Outcome: Progressing Goal: Ability to verbalize understanding of medication therapies will improve Outcome: Progressing Goal: Individualized Educational Video(s) Outcome: Progressing   Problem: Activity: Goal: Capacity to carry out activities will improve Outcome: Progressing   Problem: Cardiac: Goal: Ability to achieve and maintain adequate cardiopulmonary perfusion will improve Outcome: Progressing   Problem: Activity: Goal: Ability to tolerate increased activity will improve Outcome: Progressing   Problem: Clinical Measurements: Goal: Ability to maintain a body  temperature in the normal range will improve Outcome: Progressing   Problem: Respiratory: Goal: Ability to maintain adequate ventilation will improve Outcome: Progressing Goal: Ability to maintain a clear airway will improve Outcome: Progressing   Problem: Education: Goal: Knowledge of General Education information will improve Description: Including pain rating scale, medication(s)/side effects and non-pharmacologic comfort measures Outcome: Progressing   Problem: Health Behavior/Discharge Planning: Goal: Ability to manage health-related needs will improve Outcome: Progressing   Problem: Clinical Measurements: Goal: Ability to maintain clinical measurements within normal limits will improve Outcome: Progressing Goal: Will remain free from infection Outcome: Progressing Goal: Diagnostic test results will improve Outcome: Progressing Goal: Respiratory complications will improve Outcome: Progressing Goal: Cardiovascular complication will be avoided Outcome: Progressing   Problem: Activity: Goal: Risk for activity intolerance will decrease Outcome: Progressing   Problem: Nutrition: Goal: Adequate nutrition will be maintained Outcome: Progressing   Problem: Coping: Goal: Level of anxiety will decrease Outcome: Progressing   Problem: Elimination: Goal: Will not experience complications related to bowel motility Outcome: Progressing Goal: Will not experience complications related to urinary retention Outcome: Progressing   Problem: Pain Managment: Goal: General experience of comfort will improve Outcome: Progressing   Problem: Safety: Goal: Ability to remain free from injury will improve Outcome: Progressing   Problem: Skin Integrity: Goal: Risk for impaired skin integrity will decrease Outcome: Progressing

## 2021-07-15 NOTE — Progress Notes (Signed)
I stop by to check on Raymond Hood who is a primary care patient of mine and also I see him not only at [facility] but also at [facility] at a clinic there  We sent him in by EMS yesterday because he had saturations in the mid 70s and look poor  I greatly appreciate the care of the [facility] hospitalist team he looks relatively comfortable and markedly improved from when I saw him yesterday in the shelter  We are holding his bed at the shelter and we will plan to follow him once he gets back to the shelter once he improves  He was in the process of getting Social Security disability of one of the nurse case managers or social workers can follow-up on that that would be greatly appreciated he was working with a attorney in town to try to get this processed he desperately needs full Medicaid and Social Security disability to progress with his situation  Thank you again for your great care  Luisa Hart WrightMD  Cell [phone]

## 2021-07-15 NOTE — Progress Notes (Signed)
Pt taken off BiPAP by RT and placed on 8L HFNC. Pt tolerating well at this time, RN at bedside, RT will monitor.

## 2021-07-15 NOTE — ED Notes (Signed)
Pt brought to room 6 at this time

## 2021-07-15 NOTE — Plan of Care (Signed)
  Problem: Nutritional: Goal: Maintenance of adequate nutrition will improve Outcome: Progressing   Problem: Skin Integrity: Goal: Risk for impaired skin integrity will decrease Outcome: Progressing   Problem: Nutrition: Goal: Adequate nutrition will be maintained Outcome: Progressing   Problem: Elimination: Goal: Will not experience complications related to urinary retention Outcome: Progressing   Problem: Pain Managment: Goal: General experience of comfort will improve Outcome: Progressing

## 2021-07-15 NOTE — ED Notes (Signed)
Floor ready to receive pt at this time

## 2021-07-15 NOTE — Progress Notes (Signed)
Patient transported on BiPAP to room 6 with no complications.

## 2021-07-15 NOTE — Progress Notes (Signed)
PROGRESS NOTE        PATIENT DETAILS Name: Raymond Hood Age: 51 y.o. Sex: male Date of Birth: Sep 30, 1970 Admit Date: 07/14/2021 Admitting Physician Orland Mustard, MD [id number], Charlcie Cradle, MD  Brief Summary: Patient is a 51 y.o.  male with history of COPD, OSA/OHS, HFpEF, HTN, DM-2, schizoaffective/bipolar disorder, homelessness, ongoing tobacco use-presented with several days history of worsening shortness of breath (seen by PCP recently-started on steroids/cefdinir/Lasix dose escalated)-found to have acute hypoxic respiratory failure due to PNA and decompensated heart failure.  See below for further details.   Significant events: 7/5>> admit to [facility]-hypoxia-PNA/HFpEF exacerbation-started on BiPAP  Significant studies: 7/5>> CXR: Bibasilar opacities 7/5>> CXR: Right basilar atelectasis-?  Right pleural effusion.  Significant microbiology data: 7/5>> COVID/influenza PCR: Negative 7/5>> respiratory virus panel: Negative 7/5>> blood culture: Pending  Procedures:   Consults: None  Subjective: Feels better-still wheezing-claims he feels tight in his chest-remains on BiPAP this morning.  Objective: Vitals: Blood pressure (!) 132/92, pulse 93, temperature 100 F (37.8 C), temperature source Axillary, resp. rate 17, weight (!) 169.8 kg, SpO2 96 %.   Exam: Gen Exam:Alert awake-not in any distress-on BiPAP. HEENT:atraumatic, normocephalic Chest: Rhonchi all over-slightly decreased air entry on the left. CVS:S1S2 regular Abdomen:soft non tender, non distended-obese. Extremities:++ edema Neurology: Non focal Skin: no rash  Pertinent Labs/Radiology:    Latest Ref Rng & Units 07/15/2021    3:16 AM 07/15/2021    1:47 AM 07/14/2021    3:57 PM  CBC  WBC 4.0 - 10.5 K/uL 13.1     Hemoglobin 13.0 - 17.0 g/dL 29.9  37.1  69.6   Hematocrit 39.0 - 52.0 % 53.2  50.0  49.0   Platelets 150 - 400 K/uL 221       Lab Results  Component Value Date   NA 146 (H)  07/15/2021   K 3.9 07/15/2021   CL 99 07/15/2021   CO2 37 (H) 07/15/2021      Assessment/Plan: Acute hypoxic/hypercarbic respiratory failure: Multifactorial etiology-HFpEF exacerbation/COPD exacerbation/OHS/OSA/PNA all playing a role.  Continue BiPAP-give IV steroids-restart scheduled nebulized bronchodilators-increase Lasix to 60 mg twice daily-continue IV antibiotics-and attempt to liberate off BiPAP later today.  Sepsis due to PNA: Sepsis physiology improved but still hypoxic-continue IV antibiotics-follow cultures.  HFpEF exacerbation: Remains grossly volume overloaded-wheezing-unclear this is bronchospasm or cardiac wheezing-increasing furosemide to 60 mg IV twice daily.  Follow weights/intake/output and electrolytes.  COPD exacerbation: Wheezing all over-claims he feels "tight"-active smoker-start IV Solu-Medrol-continue scheduled bronchodilators.  Already on IV antibiotics.  OHS/OSA: Unfortunately not compliant to NIV as he is homeless.  On BiPAP.  Acute bilateral conjunctivitis: Continue erythromycin ointment.  DM-2.  (A1c 8.3 on 5/23): Being started on steroids-expect CBGs to creep up-change to resistant SSI-follow and add long-acting insulin as needed.  Oral hypoglycemic agents remain on hold.  Recent Labs    07/14/21 2159 07/15/21 0805  GLUCAP 227* 171*    HLD: Continue statin  Schizoaffective disorder-bipolar type/anxiety/depression: Resume risperidone, Cymbalta-holding Neurontin until hypercarbia improves further.  Tobacco abuse: Continue transdermal nicotine  Homelessness: Child psychotherapist evaluation prior to discharge  Morbid Obesity: Estimated body mass index is 56.92 kg/m as calculated from the following:   Height as of 01/13/21: 5\' 8"  (1.727 m).   Weight as of this encounter: 169.8 kg.   Code status:   Code Status: Full Code   DVT Prophylaxis: Prophylactic  Lovenox   Family Communication: None at bedside   Disposition Plan: Status is: Inpatient Remains  inpatient appropriate because: Hypoxia/hypercarbia-on BiPAP-not yet stable for discharge.   Planned Discharge Destination:Home probably sometime next week.   Diet: Diet Order             Diet NPO time specified  Diet effective now                     Antimicrobial agents: Anti-infectives (From admission, onward)    Start     Dose/Rate Route Frequency Ordered Stop   07/15/21 1545  cefTRIAXone (ROCEPHIN) 2 g in sodium chloride 0.9 % 100 mL IVPB        2 g 200 mL/hr over 30 Minutes Intravenous Every 24 hours 07/14/21 1841     07/14/21 2200  doxycycline (VIBRA-TABS) tablet 100 mg        100 mg Oral Every 12 hours 07/14/21 1920     07/14/21 1545  cefTRIAXone (ROCEPHIN) 2 g in sodium chloride 0.9 % 100 mL IVPB        2 g 200 mL/hr over 30 Minutes Intravenous  Once 07/14/21 1540 07/14/21 1702   07/14/21 1545  azithromycin (ZITHROMAX) 500 mg in sodium chloride 0.9 % 250 mL IVPB  Status:  Discontinued        500 mg 250 mL/hr over 60 Minutes Intravenous Every 24 hours 07/14/21 1540 07/14/21 1920        MEDICATIONS: Scheduled Meds:  atorvastatin  10 mg Oral Daily   budesonide (PULMICORT) nebulizer solution  0.25 mg Nebulization BID   doxycycline  100 mg Oral Q12H   DULoxetine  60 mg Oral Daily   enoxaparin (LOVENOX) injection  85 mg Subcutaneous Q24H   erythromycin   Both Eyes Q6H   famotidine  20 mg Oral BID   fluticasone  2 spray Each Nare Daily   furosemide  60 mg Intravenous Q12H   guaiFENesin  600 mg Oral BID   insulin aspart  0-20 Units Subcutaneous TID WC   ipratropium-albuterol  3 mL Nebulization Q6H   methylPREDNISolone (SOLU-MEDROL) injection  40 mg Intravenous Q12H   nicotine  21 mg Transdermal Daily   risperidone  4 mg Oral Daily   sodium chloride flush  3 mL Intravenous Q12H   tamsulosin  0.4 mg Oral Daily   Continuous Infusions:  sodium chloride     cefTRIAXone (ROCEPHIN)  IV     PRN Meds:.sodium chloride, acetaminophen **OR** acetaminophen,  albuterol, sodium chloride flush   I have personally reviewed following labs and imaging studies  LABORATORY DATA: CBC: Recent Labs  Lab 07/14/21 1550 07/14/21 1557 07/15/21 0147 07/15/21 0316  WBC 12.0*  --   --  13.1*  HGB 15.6 16.7 17.0 15.5  HCT 54.1* 49.0 50.0 53.2*  MCV 92.8  --   --  91.4  PLT 268  --   --  221    Basic Metabolic Panel: Recent Labs  Lab 07/14/21 1550 07/14/21 1557 07/15/21 0147 07/15/21 0316  NA 145 142 143 146*  K 3.9 3.6 3.9 3.9  CL 99  --   --  99  CO2 33*  --   --  37*  GLUCOSE 151*  --   --  157*  BUN 13  --   --  11  CREATININE 1.09  --   --  1.01  CALCIUM 7.8*  --   --  7.7*  MG  --   --   --  2.2    GFR: CrCl cannot be calculated (Unknown ideal weight.).  Liver Function Tests: Recent Labs  Lab 07/14/21 1550  AST 20  ALT 29  ALKPHOS 77  BILITOT 0.6  PROT 6.0*  ALBUMIN 2.8*   No results for input(s): "LIPASE", "AMYLASE" in the last 168 hours. No results for input(s): "AMMONIA" in the last 168 hours.  Coagulation Profile: No results for input(s): "INR", "PROTIME" in the last 168 hours.  Cardiac Enzymes: No results for input(s): "CKTOTAL", "CKMB", "CKMBINDEX", "TROPONINI" in the last 168 hours.  BNP (last 3 results) No results for input(s): "PROBNP" in the last 8760 hours.  Lipid Profile: No results for input(s): "CHOL", "HDL", "LDLCALC", "TRIG", "CHOLHDL", "LDLDIRECT" in the last 72 hours.  Thyroid Function Tests: No results for input(s): "TSH", "T4TOTAL", "FREET4", "T3FREE", "THYROIDAB" in the last 72 hours.  Anemia Panel: No results for input(s): "VITAMINB12", "FOLATE", "FERRITIN", "TIBC", "IRON", "RETICCTPCT" in the last 72 hours.  Urine analysis:    Component Value Date/Time   COLORURINE YELLOW (A) 11/16/2020 2238   APPEARANCEUR HAZY (A) 11/16/2020 2238   LABSPEC >1.046 (H) 11/16/2020 2238   PHURINE 5.0 11/16/2020 2238   GLUCOSEU NEGATIVE 11/16/2020 2238   HGBUR NEGATIVE 11/16/2020 2238   BILIRUBINUR  NEGATIVE 11/16/2020 2238   KETONESUR NEGATIVE 11/16/2020 2238   PROTEINUR NEGATIVE 11/16/2020 2238   NITRITE NEGATIVE 11/16/2020 2238   LEUKOCYTESUR NEGATIVE 11/16/2020 2238    Sepsis Labs: Lactic Acid, Venous    Component Value Date/Time   LATICACIDVEN 1.6 07/14/2021 1724    MICROBIOLOGY: Recent Results (from the past 240 hour(s))  Resp Panel by RT-PCR (Flu A&B, Covid) Anterior Nasal Swab     Status: None   Collection Time: 07/14/21  3:11 PM   Specimen: Anterior Nasal Swab  Result Value Ref Range Status   SARS Coronavirus 2 by RT PCR NEGATIVE NEGATIVE Final    Comment: (NOTE) SARS-CoV-2 target nucleic acids are NOT DETECTED.  The SARS-CoV-2 RNA is generally detectable in upper respiratory specimens during the acute phase of infection. The lowest concentration of SARS-CoV-2 viral copies this assay can detect is 138 copies/mL. A negative result does not preclude SARS-Cov-2 infection and should not be used as the sole basis for treatment or other patient management decisions. A negative result may occur with  improper specimen collection/handling, submission of specimen other than nasopharyngeal swab, presence of viral mutation(s) within the areas targeted by this assay, and inadequate number of viral copies(<138 copies/mL). A negative result must be combined with clinical observations, patient history, and epidemiological information. The expected result is Negative.  Fact Sheet for Patients:  BloggerCourse.com  Fact Sheet for Healthcare Providers:  SeriousBroker.it  This test is no t yet approved or cleared by the [place] FDA and  has been authorized for detection and/or diagnosis of SARS-CoV-2 by FDA under an Emergency Use Authorization (EUA). This EUA will remain  in effect (meaning this test can be used) for the duration of the COVID-19 declaration under Section 564(b)(1) of the Act, 21 U.S.C.section  360bbb-3(b)(1), unless the authorization is terminated  or revoked sooner.       Influenza A by PCR NEGATIVE NEGATIVE Final   Influenza B by PCR NEGATIVE NEGATIVE Final    Comment: (NOTE) The Xpert Xpress SARS-CoV-2/FLU/RSV plus assay is intended as an aid in the diagnosis of influenza from Nasopharyngeal swab specimens and should not be used as a sole basis for treatment. Nasal washings and aspirates are unacceptable for Xpert Xpress SARS-CoV-2/FLU/RSV testing.  Fact  Sheet for Patients: BloggerCourse.com  Fact Sheet for Healthcare Providers: SeriousBroker.it  This test is not yet approved or cleared by the [place] FDA and has been authorized for detection and/or diagnosis of SARS-CoV-2 by FDA under an Emergency Use Authorization (EUA). This EUA will remain in effect (meaning this test can be used) for the duration of the COVID-19 declaration under Section 564(b)(1) of the Act, 21 U.S.C. section 360bbb-3(b)(1), unless the authorization is terminated or revoked.  Performed at [facility], [address], [place], [place] [address]   Respiratory (~20 pathogens) panel by PCR     Status: None   Collection Time: 07/14/21  3:11 PM   Specimen: Nasopharyngeal Swab; Respiratory  Result Value Ref Range Status   Adenovirus NOT DETECTED NOT DETECTED Final   Coronavirus 229E NOT DETECTED NOT DETECTED Final    Comment: (NOTE) The Coronavirus on the Respiratory Panel, DOES NOT test for the novel  Coronavirus (2019 nCoV)    Coronavirus HKU1 NOT DETECTED NOT DETECTED Final   Coronavirus NL63 NOT DETECTED NOT DETECTED Final   Coronavirus OC43 NOT DETECTED NOT DETECTED Final   Metapneumovirus NOT DETECTED NOT DETECTED Final   Rhinovirus / Enterovirus NOT DETECTED NOT DETECTED Final   Influenza A NOT DETECTED NOT DETECTED Final   Influenza B NOT DETECTED NOT DETECTED Final   Parainfluenza Virus 1 NOT DETECTED NOT DETECTED Final    Parainfluenza Virus 2 NOT DETECTED NOT DETECTED Final   Parainfluenza Virus 3 NOT DETECTED NOT DETECTED Final   Parainfluenza Virus 4 NOT DETECTED NOT DETECTED Final   Respiratory Syncytial Virus NOT DETECTED NOT DETECTED Final   Bordetella pertussis NOT DETECTED NOT DETECTED Final   Bordetella Parapertussis NOT DETECTED NOT DETECTED Final   Chlamydophila pneumoniae NOT DETECTED NOT DETECTED Final   Mycoplasma pneumoniae NOT DETECTED NOT DETECTED Final    Comment: Performed at [facility], [address] [place], [place] [address]    RADIOLOGY STUDIES/RESULTS: DG Chest Port 1V same Day  Result Date: 07/15/2021 CLINICAL DATA:  Shortness of breath, COPD, smoker EXAM: PORTABLE CHEST 1 VIEW COMPARISON:  Portable exam 0852 hours compared to 07/14/2021 FINDINGS: Enlargement of cardiac silhouette. Mediastinal contours normal. Low lung volumes with RIGHT basilar atelectasis and question small RIGHT pleural effusion. Upper lungs clear. No pneumothorax or acute osseous findings. IMPRESSION: Enlargement of cardiac silhouette. Low lung volumes with RIGHT basilar atelectasis and question small RIGHT pleural effusion. Electronically Signed   By: Ulyses Southward M.D.   On: 07/15/2021 09:08   DG Chest Portable 1 View  Result Date: 07/14/2021 CLINICAL DATA:  Shortness of breath, history COPD, smoker, hypertension diabetes EXAM: PORTABLE CHEST 1 VIEW COMPARISON:  01/13/2021 FINDINGS: Limited exam because of body habitus and portable technique. Patchy bibasilar opacities worse on the right, obscuring the right hemidiaphragm concerning for basilar pneumonia. Small effusion on the right not excluded. Heart is enlarged. No pneumothorax. Trachea midline. IMPRESSION: Bibasilar opacities, worse on the right with associated small right effusion concerning for basilar pneumonia. Stable heart size and vascularity. Electronically Signed   By: Judie Petit.  Shick M.D.   On: 07/14/2021 15:26     LOS: 1 day   Jeoffrey Massed,  MD  [facility]    To contact the attending provider between 7A-7P or the covering provider during after hours 7P-7A, please log into the web site [email or web address] and access using universal [place] password for that web site. If you do not have the password, please call the hospital operator.  07/15/2021, 10:26 AM

## 2021-07-16 ENCOUNTER — Other Ambulatory Visit: Payer: Self-pay

## 2021-07-16 LAB — BASIC METABOLIC PANEL
Anion gap: 10 (ref 5–15)
BUN: 14 mg/dL (ref 6–20)
CO2: 41 mmol/L — ABNORMAL HIGH (ref 22–32)
Calcium: 8 mg/dL — ABNORMAL LOW (ref 8.9–10.3)
Chloride: 93 mmol/L — ABNORMAL LOW (ref 98–111)
Creatinine, Ser: 0.91 mg/dL (ref 0.61–1.24)
GFR, Estimated: >60 mL/min (ref >60)
Glucose, Bld: 180 mg/dL — ABNORMAL HIGH (ref 70–99)
Potassium: 3.9 mmol/L (ref 3.5–5.1)
Sodium: 144 mmol/L (ref 135–145)

## 2021-07-16 LAB — LEGIONELLA PNEUMOPHILA SEROGP 1 UR AG: L. pneumophila Serogp 1 Ur Ag: NEGATIVE

## 2021-07-16 LAB — GLUCOSE, CAPILLARY
Glucose-Capillary: 265 mg/dL — ABNORMAL HIGH (ref 70–99)
Glucose-Capillary: 242 mg/dL — ABNORMAL HIGH (ref 70–99)
Glucose-Capillary: 360 mg/dL — ABNORMAL HIGH (ref 70–99)
Glucose-Capillary: 213 mg/dL — ABNORMAL HIGH (ref 70–99)

## 2021-07-16 LAB — CBC
HCT: 51.2 % (ref 39.0–52.0)
Hemoglobin: 15 g/dL (ref 13.0–17.0)
MCH: 25.9 pg — ABNORMAL LOW (ref 26.0–34.0)
MCHC: 29.3 g/dL — ABNORMAL LOW (ref 30.0–36.0)
MCV: 88.3 fL (ref 80.0–100.0)
Platelets: 232 10*3/uL (ref 150–400)
RBC: 5.8 MIL/uL (ref 4.22–5.81)
RDW: 16.6 % — ABNORMAL HIGH (ref 11.5–15.5)
WBC: 13.4 10*3/uL — ABNORMAL HIGH (ref 4.0–10.5)
nRBC: 0 % (ref 0.0–0.2)

## 2021-07-16 LAB — PROCALCITONIN: Procalcitonin: <0.1 ng/mL

## 2021-07-16 MED ORDER — INSULIN GLARGINE-YFGN 100 UNIT/ML ~~LOC~~ SOLN
12.0000 [IU] | Freq: Every day | SUBCUTANEOUS | Status: DC
  Administered 2021-07-16 – 2021-07-17 (×2): 12 [IU] via SUBCUTANEOUS
  Filled 2021-07-16 (×2): qty 0.12

## 2021-07-16 MED ORDER — FUROSEMIDE 10 MG/ML IJ SOLN
80.0000 mg | Freq: Two times a day (BID) | INTRAMUSCULAR | Status: DC
  Administered 2021-07-16 – 2021-07-18 (×6): 80 mg via INTRAVENOUS
  Filled 2021-07-16 (×6): qty 8

## 2021-07-16 NOTE — Evaluation (Signed)
Physical Therapy Evaluation Patient Details Name: Raymond Hood MRN: [id number] DOB: 10/10/1970 Today's Date: 07/16/2021  History of Present Illness  51 y/o male presented to ED on 07/14/21 for SOB, cough, hypoxia. Found to have pneumonia. Admitted for acute on chronic respiratory failure with hypoxia. PMH: COPD, bipolar 1 disorder, T2DM, HTN, OSA, RA  Clinical Impression  Patient admitted with the above. PTA, patient from [employer] and was mobilizing with cane for short distances. Patient currently presents with decreased activity tolerance, impaired balance, and weakness. Patient currently requiring 5L O2 HFNC and spO2 drops to 87% with limited mobility. Patient requires minA for sit to stand and taking sidesteps with HHAx1. Patient will benefit from skilled PT services during acute stay to address listed deficits. Recommend HHPT but unsure if they access homeless shelters.        Recommendations for follow up therapy are one component of a multi-disciplinary discharge planning process, led by the attending physician.  Recommendations may be updated based on patient status, additional functional criteria and insurance authorization.  Follow Up Recommendations Home health PT (not sure if HHPT goes to homeless shelters and no insurance for SNF) Can patient physically be transported by private vehicle: No    Assistance Recommended at Discharge Frequent or constant Supervision/Assistance  Patient can return home with the following  A little help with walking and/or transfers;A little help with bathing/dressing/bathroom;Assist for transportation    Equipment Recommendations Wheelchair (measurements PT);Wheelchair cushion (measurements PT)  Recommendations for Other Services       Functional Status Assessment Patient has had a recent decline in their functional status and demonstrates the ability to make significant improvements in function in a reasonable and predictable  amount of time.     Precautions / Restrictions Precautions Precautions: Fall Precaution Comments: watch O2 Restrictions Weight Bearing Restrictions: No      Mobility  Bed Mobility Overal bed mobility: Needs Assistance Bed Mobility: Supine to Sit, Sit to Supine     Supine to sit: Supervision Sit to supine: Supervision   General bed mobility comments: increased time and effort to come into sitting. Supervision for safety and line management    Transfers Overall transfer level: Needs assistance Equipment used: 1 person hand held assist Transfers: Sit to/from Stand, Bed to chair/wheelchair/BSC Sit to Stand: Min assist   Step pivot transfers: Min assist       General transfer comment: minA to stand from low bed surface and take sidesteps towards [facility] with HHAx1. SpO2 dropped to 87% on 5L O2     Ambulation/Gait                  Stairs            Wheelchair Mobility    Modified Rankin (Stroke Patients Only)       Balance Overall balance assessment: Needs assistance Sitting-balance support: No upper extremity supported, Feet supported Sitting balance-Leahy Scale: Good     Standing balance support: Single extremity supported, During functional activity Standing balance-Leahy Scale: Fair                               Pertinent Vitals/Pain Pain Assessment Pain Assessment: Faces Faces Pain Scale: Hurts little more Pain Location: low back Pain Descriptors / Indicators: Discomfort Pain Intervention(s): Monitored during session, Repositioned    Home Living Family/patient expects to be discharged to:: Shelter/Homeless  Additional Comments: Currently at [facility] shelter    Prior Function Prior Level of Function : Independent/Modified Independent             Mobility Comments: uses cane for mobility but only ambulates very short distances (to bathroom and back) at shelter       Hand  Dominance        Extremity/Trunk Assessment   Upper Extremity Assessment Upper Extremity Assessment: Defer to OT evaluation    Lower Extremity Assessment Lower Extremity Assessment: Generalized weakness    Cervical / Trunk Assessment Cervical / Trunk Assessment: Other exceptions (body habitus)  Communication   Communication: No difficulties  Cognition Arousal/Alertness: Awake/alert Behavior During Therapy: WFL for tasks assessed/performed Overall Cognitive Status: Within Functional Limits for tasks assessed                                          General Comments General comments (skin integrity, edema, etc.): On 5L O2 HFNC, spO2 drop to 87% during standing but slow increase to 90%    Exercises     Assessment/Plan    PT Assessment Patient needs continued PT services  PT Problem List Decreased strength;Decreased activity tolerance;Decreased balance;Decreased mobility;Cardiopulmonary status limiting activity       PT Treatment Interventions DME instruction;Gait training;Therapeutic activities;Functional mobility training;Therapeutic exercise;Balance training;Patient/family education    PT Goals (Current goals can be found in the Care Plan section)  Acute Rehab PT Goals Patient Stated Goal: to get better PT Goal Formulation: With patient Time For Goal Achievement: 07/30/21 Potential to Achieve Goals: Good    Frequency Min 3X/week     Co-evaluation               AM-PAC PT "6 Clicks" Mobility  Outcome Measure Help needed turning from your back to your side while in a flat bed without using bedrails?: A Little Help needed moving from lying on your back to sitting on the side of a flat bed without using bedrails?: A Little Help needed moving to and from a bed to a chair (including a wheelchair)?: A Little Help needed standing up from a chair using your arms (e.g., wheelchair or bedside chair)?: A Little Help needed to walk in hospital room?: A  Lot Help needed climbing 3-5 steps with a railing? : Total 6 Click Score: 15    End of Session Equipment Utilized During Treatment: Oxygen Activity Tolerance: Patient tolerated treatment well Patient left: in bed;with call bell/phone within reach;with bed alarm set Nurse Communication: Mobility status PT Visit Diagnosis: Other abnormalities of gait and mobility (R26.89);Unsteadiness on feet (R26.81);Muscle weakness (generalized) (M62.81)    Time: 5631-4970 PT Time Calculation (min) (ACUTE ONLY): 20 min   Charges:   PT Evaluation $PT Eval Moderate Complexity: 1 Mod          Adja Ruff A. Dan Humphreys PT, DPT Acute Rehabilitation Services Office [phone]   Viviann Spare 07/16/2021, 1:47 PM

## 2021-07-16 NOTE — TOC Initial Note (Signed)
Transition of Care [facility]) - Initial/Assessment Note    Patient Details  Name: Raymond Hood MRN: [id number] Date of Birth: 05/20/1970  Transition of Care [facility]) CM/SW Contact:    Epifanio Lesches, RN Phone Number: 07/16/2021, 10:57 AM  Clinical Narrative:                 NCM spoke with pt  @ bedside regarding D/C planning. NCM introduce self and explained role. Pt states resides @ [facility]. States has been there since Nov. 2022. States PTA independent with ADL's.," walks some ". States has BIPAP and cane @ the shelter. States once d/c ready will return to the shelter.Marland KitchenMarland KitchenRefer to Dr. Florene Route note in epic.  TOC team following for needs....  Expected Discharge Plan: Home w Home Health Services Barriers to Discharge: Continued Medical Work up   Patient Goals and CMS Choice        Expected Discharge Plan and Services Expected Discharge Plan: Home w Home Health Services   Discharge Planning Services: CM Consult   Living arrangements for the past 2 months: Homeless Shelter (since Nov. 2022)                    Prior Living Arrangements/Services Living arrangements for the past 2 months: Homeless Shelter (since Nov. 2022) Lives with:: Other (Comment) (Resides @ shelter) Patient language and need for interpreter reviewed:: Yes Do you feel safe going back to the place where you live?: Yes      Need for Family Participation in Patient Care: Yes (Comment) Care giver support system in place?: No (comment) Current home services: DME (bipap, cane) Criminal Activity/Legal Involvement Pertinent to Current Situation/Hospitalization: No - Comment as needed  Activities of Daily Living      Permission Sought/Granted   Permission granted to share information with : Yes, Verbal Permission Granted  Share Information with NAME: Madilyn Fireman  [phone]           Emotional Assessment Appearance:: Appears stated age Attitude/Demeanor/Rapport: Gracious Affect (typically  observed): Accepting Orientation: : Oriented to Self, Oriented to Place, Oriented to  Time, Oriented to Situation Alcohol / Substance Use: Tobacco Use Psych Involvement: No (comment)  Admission diagnosis:  COPD exacerbation (HCC) [J44.1] Acute on chronic respiratory failure with hypoxia (HCC) [J96.21] Acute respiratory failure with hypoxia and hypercapnia (HCC) [J96.01, J96.02] Patient Active Problem List   Diagnosis Date Noted   Acute on chronic respiratory failure with hypoxia (HCC) 07/14/2021   Anxiety and depression 07/14/2021   Essential hypertension 07/14/2021   Sepsis from pneumonia  07/14/2021   CAP (community acquired pneumonia) 07/14/2021   Acute on chronic diastolic CHF (congestive heart failure) (HCC) 07/14/2021   Conjunctivitis 07/14/2021   Prolonged QT interval 07/14/2021   Need for hepatitis C screening test 01/25/2021   Chronic diastolic heart failure (HCC) 01/14/2021   Type 2 diabetes mellitus with hyperglycemia (HCC) 12/14/2020   GERD (gastroesophageal reflux disease) 10/15/2020   Tobacco abuse 10/15/2020   Hypertension associated with diabetes (HCC) 10/15/2020   Renal mass, right 10/15/2020   COPD with chronic bronchitis (HCC) 10/15/2020   Morbid obesity with BMI of 50.0-59.9, adult (HCC) 10/15/2020   Homeless 10/15/2020   MDD (major depressive disorder), recurrent episode (HCC) 09/28/2020   Anxiety 09/28/2020   Schizo affective schizophrenia (HCC) 09/28/2020   Numbness and tingling in both hands 05/28/2020   Adrenal hyperplasia (HCC) 06/05/2019   Low back pain 08/29/2018   Multiple joint pain 08/29/2018   OSA/OHS 08/17/2018  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 [place]  [address] Phone: [phone] Fax: [phone]  [facility] at [facility] [address] [place] [address] Phone: [phone] Fax: [phone]     Social Determinants of Health (SDOH) Interventions    Readmission Risk Interventions     No data to display

## 2021-07-16 NOTE — Progress Notes (Signed)
PROGRESS NOTE        PATIENT DETAILS Name: Raymond Hood Age: 51 y.o. Sex: male Date of Birth: [birth year] Admit Date: 07/14/2021 Admitting Physician Orma Flaming, MD [id number], Burnett Harry, MD  Brief Summary: Patient is a 51 y.o.  male with history of COPD, OSA/OHS, HFpEF, HTN, DM-2, schizoaffective/bipolar disorder, homelessness, ongoing tobacco use-presented with several days history of worsening shortness of breath (seen by PCP recently-started on steroids/cefdinir/Lasix dose escalated)-found to have acute hypoxic respiratory failure due to PNA and decompensated heart failure.  See below for further details.   Significant events: 7/5>> admit to [facility]-hypoxia-PNA/HFpEF exacerbation-started on BiPAP 7/6>> liberated off BiPAP-transition to 8 L HFNC.  Significant studies: 7/5>> CXR: Bibasilar opacities 7/5>> CXR: Right basilar atelectasis-?  Right pleural effusion.  Significant microbiology data: 7/5>> COVID/influenza PCR: Negative 7/5>> respiratory virus panel: Negative 7/5>> blood culture: No growth 7/6>> blood culture: Pending  Procedures:   Consults: None  Subjective: Feels better-much more awake and alert-tolerated BiPAP without any issues last night.  Stable on 5 L of HFNC this morning.  Objective: Vitals: Blood pressure (!) 142/83, pulse 81, temperature 98 F (36.7 C), temperature source Oral, resp. rate 19, height 5\' 8"  (1.727 m), weight (!) 165.9 kg, SpO2 97 %.   Exam: Gen Exam:Alert awake-not in any distress-on BiPAP. HEENT:atraumatic, normocephalic Chest: Rhonchi all over-slightly decreased air entry on the left. CVS:S1S2 regular Abdomen:soft non tender, non distended-obese. Extremities:++ edema Neurology: Non focal Skin: no rash  Pertinent Labs/Radiology:    Latest Ref Rng & Units 07/16/2021    1:57 AM 07/15/2021    3:16 AM 07/15/2021    1:47 AM  CBC  WBC 4.0 - 10.5 K/uL 13.4  13.1    Hemoglobin 13.0 - 17.0 g/dL 15.0  15.5  17.0    Hematocrit 39.0 - 52.0 % 51.2  53.2  50.0   Platelets 150 - 400 K/uL 232  221      Lab Results  Component Value Date   NA 144 07/16/2021   K 3.9 07/16/2021   CL 93 (L) 07/16/2021   CO2 41 (H) 07/16/2021    Assessment/Plan: Acute hypoxic/hypercarbic respiratory failure: Overall much better-liberated off BiPAP yesterday-on 5 L of HFNC this morning.  Etiology felt to be multifactorial from HFpEF exacerbation-COPD exacerbation/PNA/OSA/OHS.  Continue IV Lasix/scheduled bronchodilators/steroids/IV antibiotics.    Sepsis due to PNA: Sepsis physiology resolved-hypoxia improving-continue IV antibiotics-follow cultures.   HFpEF exacerbation: Volume status improving but still remains volume overloaded-increase furosemide to 80 mg IV twice daily-follow weights/intake/output and electrolytes.   COPD exacerbation: Still wheezing-but overall moving air much more easily today compared to yesterday-continue IV Solu-Medrol/bronchodilators.   OHS/OSA: Claims that he has a NIV at his shelter-unclear how compliant he is to this-needs to continue NIV/BiPAP nightly and whenever sleeping.   Acute bilateral conjunctivitis: Continue erythromycin ointment.  DM-2.  (A1c 8.3 on 5/23): CBGs on the higher side-start low-dose Semglee-continue SSI-suspect as steroids get tapered down-he will not require long-acting insulin.  Continue to hold oral hypoglycemic agents.  Recent Labs    07/15/21 1615 07/15/21 2135 07/16/21 0758  GLUCAP 259* 224* 265*     HLD: Continue statin  Schizoaffective disorder-bipolar type/anxiety/depression: Resume risperidone, Cymbalta-holding Neurontin until hypercarbia improves further.  Tobacco abuse: Continue transdermal nicotine  Debility/deconditioning: Claims he does not walk much-getting PT/OT.  He has a nonfocal exam.  Homelessness: [employer], advertising prior  to discharge  Morbid Obesity: Estimated body mass index is 55.61 kg/m as calculated from the following:    Height as of this encounter: 5\' 8"  (1.727 m).   Weight as of this encounter: 165.9 kg.   Code status:   Code Status: Full Code   DVT Prophylaxis: Prophylactic Lovenox   Family Communication: None at bedside (per patient he has no family members)   Disposition Plan: Status is: Inpatient Remains inpatient appropriate because: Hypoxia/hypercarbia-on BiPAP-not yet stable for discharge.   Planned Discharge Destination:Home probably sometime next week.   Diet: Diet Order             Diet heart healthy/carb modified Room service appropriate? Yes; Fluid consistency: Thin  Diet effective now                     Antimicrobial agents: Anti-infectives (From admission, onward)    Start     Dose/Rate Route Frequency Ordered Stop   07/15/21 1545  cefTRIAXone (ROCEPHIN) 2 g in sodium chloride 0.9 % 100 mL IVPB        2 g 200 mL/hr over 30 Minutes Intravenous Every 24 hours 07/14/21 1841     07/14/21 2200  doxycycline (VIBRA-TABS) tablet 100 mg        100 mg Oral Every 12 hours 07/14/21 1920     07/14/21 1545  cefTRIAXone (ROCEPHIN) 2 g in sodium chloride 0.9 % 100 mL IVPB        2 g 200 mL/hr over 30 Minutes Intravenous  Once 07/14/21 1540 07/14/21 1702   07/14/21 1545  azithromycin (ZITHROMAX) 500 mg in sodium chloride 0.9 % 250 mL IVPB  Status:  Discontinued        500 mg 250 mL/hr over 60 Minutes Intravenous Every 24 hours 07/14/21 1540 07/14/21 1920        MEDICATIONS: Scheduled Meds:  atorvastatin  10 mg Oral Daily   budesonide (PULMICORT) nebulizer solution  0.25 mg Nebulization BID   doxycycline  100 mg Oral Q12H   DULoxetine  60 mg Oral Daily   enoxaparin (LOVENOX) injection  85 mg Subcutaneous Q24H   erythromycin   Both Eyes Q6H   famotidine  20 mg Oral BID   fluticasone  2 spray Each Nare Daily   furosemide  80 mg Intravenous BID   guaiFENesin  600 mg Oral BID   insulin aspart  0-20 Units Subcutaneous TID WC   ipratropium-albuterol  3 mL Nebulization Q6H    methylPREDNISolone (SOLU-MEDROL) injection  40 mg Intravenous Q12H   nicotine  21 mg Transdermal Daily   risperidone  4 mg Oral Daily   sodium chloride flush  3 mL Intravenous Q12H   tamsulosin  0.4 mg Oral Daily   Continuous Infusions:  sodium chloride     cefTRIAXone (ROCEPHIN)  IV 2 g (07/15/21 1815)   PRN Meds:.sodium chloride, acetaminophen **OR** acetaminophen, albuterol, sodium chloride flush   I have personally reviewed following labs and imaging studies  LABORATORY DATA: CBC: Recent Labs  Lab 07/14/21 1550 07/14/21 1557 07/15/21 0147 07/15/21 0316 07/16/21 0157  WBC 12.0*  --   --  13.1* 13.4*  HGB 15.6 16.7 17.0 15.5 15.0  HCT 54.1* 49.0 50.0 53.2* 51.2  MCV 92.8  --   --  91.4 88.3  PLT 268  --   --  221 232     Basic Metabolic Panel: Recent Labs  Lab 07/14/21 1550 07/14/21 1557 07/15/21 0147 07/15/21 0316 07/16/21 0157  NA  145 142 143 146* 144  K 3.9 3.6 3.9 3.9 3.9  CL 99  --   --  99 93*  CO2 33*  --   --  37* 41*  GLUCOSE 151*  --   --  157* 180*  BUN 13  --   --  11 14  CREATININE 1.09  --   --  1.01 0.91  CALCIUM 7.8*  --   --  7.7* 8.0*  MG  --   --   --  2.2  --      GFR: Estimated Creatinine Clearance: 150.8 mL/min (by C-G formula based on SCr of 0.91 mg/dL).  Liver Function Tests: Recent Labs  Lab 07/14/21 1550  AST 20  ALT 29  ALKPHOS 77  BILITOT 0.6  PROT 6.0*  ALBUMIN 2.8*    No results for input(s): "LIPASE", "AMYLASE" in the last 168 hours. No results for input(s): "AMMONIA" in the last 168 hours.  Coagulation Profile: No results for input(s): "INR", "PROTIME" in the last 168 hours.  Cardiac Enzymes: No results for input(s): "CKTOTAL", "CKMB", "CKMBINDEX", "TROPONINI" in the last 168 hours.  BNP (last 3 results) No results for input(s): "PROBNP" in the last 8760 hours.  Lipid Profile: No results for input(s): "CHOL", "HDL", "LDLCALC", "TRIG", "CHOLHDL", "LDLDIRECT" in the last 72 hours.  Thyroid Function  Tests: No results for input(s): "TSH", "T4TOTAL", "FREET4", "T3FREE", "THYROIDAB" in the last 72 hours.  Anemia Panel: No results for input(s): "VITAMINB12", "FOLATE", "FERRITIN", "TIBC", "IRON", "RETICCTPCT" in the last 72 hours.  Urine analysis:    Component Value Date/Time   COLORURINE YELLOW (A) 11/16/2020 2238   APPEARANCEUR HAZY (A) 11/16/2020 2238   LABSPEC >1.046 (H) 11/16/2020 2238   PHURINE 5.0 11/16/2020 2238   GLUCOSEU NEGATIVE 11/16/2020 2238   HGBUR NEGATIVE 11/16/2020 2238   [place] NEGATIVE 11/16/2020 2238   [place] 11/16/2020 2238   PROTEINUR NEGATIVE 11/16/2020 2238   NITRITE NEGATIVE 11/16/2020 2238   LEUKOCYTESUR NEGATIVE 11/16/2020 2238    Sepsis Labs: Lactic Acid, Venous    Component Value Date/Time   LATICACIDVEN 1.6 07/14/2021 1724    MICROBIOLOGY: Recent Results (from the past 240 hour(s))  Resp Panel by RT-PCR (Flu A&B, Covid) Anterior Nasal Swab     Status: None   Collection Time: 07/14/21  3:11 PM   Specimen: Anterior Nasal Swab  Result Value Ref Range Status   SARS Coronavirus 2 by RT PCR NEGATIVE NEGATIVE Final    Comment: (NOTE) SARS-CoV-2 target nucleic acids are NOT DETECTED.  The SARS-CoV-2 RNA is generally detectable in upper respiratory specimens during the acute phase of infection. The lowest concentration of SARS-CoV-2 viral copies this assay can detect is 138 copies/mL. A negative result does not preclude SARS-Cov-2 infection and should not be used as the sole basis for treatment or other patient management decisions. A negative result may occur with  improper specimen collection/handling, submission of specimen other than nasopharyngeal swab, presence of viral mutation(s) within the areas targeted by this assay, and inadequate number of viral copies(<138 copies/mL). A negative result must be combined with clinical observations, patient history, and epidemiological information. The expected result is  Negative.  Fact Sheet for Patients:  EntrepreneurPulse.com.au  Fact Sheet for Healthcare Providers:  IncredibleEmployment.be  This test is no t yet approved or cleared by the [place] FDA and  has been authorized for detection and/or diagnosis of SARS-CoV-2 by FDA under an Emergency Use Authorization (EUA). This EUA will remain  in effect (meaning  this test can be used) for the duration of the COVID-19 declaration under Section 564(b)(1) of the Act, 21 U.S.C.section 360bbb-3(b)(1), unless the authorization is terminated  or revoked sooner.       Influenza A by PCR NEGATIVE NEGATIVE Final   Influenza B by PCR NEGATIVE NEGATIVE Final    Comment: (NOTE) The Xpert Xpress SARS-CoV-2/FLU/RSV plus assay is intended as an aid in the diagnosis of influenza from Nasopharyngeal swab specimens and should not be used as a sole basis for treatment. Nasal washings and aspirates are unacceptable for Xpert Xpress SARS-CoV-2/FLU/RSV testing.  Fact Sheet for Patients: EntrepreneurPulse.com.au  Fact Sheet for Healthcare Providers: IncredibleEmployment.be  This test is not yet approved or cleared by the [place] FDA and has been authorized for detection and/or diagnosis of SARS-CoV-2 by FDA under an Emergency Use Authorization (EUA). This EUA will remain in effect (meaning this test can be used) for the duration of the COVID-19 declaration under Section 564(b)(1) of the Act, 21 U.S.C. section 360bbb-3(b)(1), unless the authorization is terminated or revoked.  Performed at [facility], [place] [address], [place], [place] [address]   Respiratory (~20 pathogens) panel by PCR     Status: None   Collection Time: 07/14/21  3:11 PM   Specimen: Nasopharyngeal Swab; Respiratory  Result Value Ref Range Status   Adenovirus NOT DETECTED NOT DETECTED Final   Coronavirus 229E NOT DETECTED NOT DETECTED Final    Comment:  (NOTE) The Coronavirus on the Respiratory Panel, DOES NOT test for the novel  Coronavirus (2019 nCoV)    Coronavirus HKU1 NOT DETECTED NOT DETECTED Final   Coronavirus NL63 NOT DETECTED NOT DETECTED Final   Coronavirus OC43 NOT DETECTED NOT DETECTED Final   Metapneumovirus NOT DETECTED NOT DETECTED Final   Rhinovirus / Enterovirus NOT DETECTED NOT DETECTED Final   Influenza A NOT DETECTED NOT DETECTED Final   Influenza B NOT DETECTED NOT DETECTED Final   Parainfluenza Virus 1 NOT DETECTED NOT DETECTED Final   Parainfluenza Virus 2 NOT DETECTED NOT DETECTED Final   Parainfluenza Virus 3 NOT DETECTED NOT DETECTED Final   Parainfluenza Virus 4 NOT DETECTED NOT DETECTED Final   Respiratory Syncytial Virus NOT DETECTED NOT DETECTED Final   Bordetella pertussis NOT DETECTED NOT DETECTED Final   Bordetella Parapertussis NOT DETECTED NOT DETECTED Final   Chlamydophila pneumoniae NOT DETECTED NOT DETECTED Final   Mycoplasma pneumoniae NOT DETECTED NOT DETECTED Final    Comment: Performed at [facility], [place], [place], [place] [address]  Culture, blood (routine x 2)     Status: None (Preliminary result)   Collection Time: 07/14/21  4:50 PM   Specimen: BLOOD LEFT HAND  Result Value Ref Range Status   Specimen Description BLOOD LEFT HAND  Final   Special Requests   Final    BOTTLES DRAWN AEROBIC AND ANAEROBIC Blood Culture adequate volume   Culture   Final    NO GROWTH < 24 HOURS Performed at [facility], [place] [address], [place], [place] [address]    Report Status PENDING  Incomplete    RADIOLOGY STUDIES/RESULTS: ECHOCARDIOGRAM COMPLETE  Result Date: 07/15/2021    ECHOCARDIOGRAM REPORT   Patient Name:   Virat Distad Date of Exam: 07/15/2021 Medical Rec #:  [id number]     Height:       68.0 in Accession #:    [id number]    Weight:       374.3 lb Date of Birth:  1970/05/16  BSA:          2.668 m Patient Age:    50 years      BP:           127/82 mmHg Patient Gender: M              HR:           92 bpm. Exam Location:  Inpatient Procedure: 2D Echo, Cardiac Doppler, Color Doppler and Intracardiac            Opacification Agent Indications:    CHF  History:        Patient has prior history of Echocardiogram examinations. COPD;                 Risk Factors:Hypertension, Diabetes, Sleep Apnea and Current                 Smoker.  Sonographer:    Cleatis Polka Referring Phys: Orland Mustard  Sonographer Comments: Patient is morbidly obese. Image acquisition challenging due to patient body habitus. Pt sitting up, on BiPAP IMPRESSIONS  1. Left ventricular ejection fraction, by estimation, is 55 to 60%. The left ventricle has normal function. The left ventricle has no regional wall motion abnormalities. Left ventricular diastolic parameters are consistent with Grade I diastolic dysfunction (impaired relaxation).  2. Right ventricular systolic function is normal. The right ventricular size is normal.  3. The mitral valve is normal in structure. No evidence of mitral valve regurgitation. No evidence of mitral stenosis.  4. The aortic valve is tricuspid. Aortic valve regurgitation is not visualized. No aortic stenosis is present.  5. The inferior vena cava is normal in size with greater than 50% respiratory variability, suggesting right atrial pressure of 3 mmHg. Comparison(s): No significant change from prior study. Prior images reviewed side by side. FINDINGS  Left Ventricle: Left ventricular ejection fraction, by estimation, is 55 to 60%. The left ventricle has normal function. The left ventricle has no regional wall motion abnormalities. Definity contrast agent was given IV to delineate the left ventricular  endocardial borders. The left ventricular internal cavity size was normal in size. There is no left ventricular hypertrophy. Left ventricular diastolic parameters are consistent with Grade I diastolic dysfunction (impaired relaxation). Right Ventricle: The right ventricular size is  normal. No increase in right ventricular wall thickness. Right ventricular systolic function is normal. Left Atrium: Left atrial size was normal in size. Right Atrium: Right atrial size was normal in size. Pericardium: There is no evidence of pericardial effusion. Mitral Valve: The mitral valve is normal in structure. No evidence of mitral valve regurgitation. No evidence of mitral valve stenosis. Tricuspid Valve: The tricuspid valve is normal in structure. Tricuspid valve regurgitation is trivial. No evidence of tricuspid stenosis. Aortic Valve: The aortic valve is tricuspid. Aortic valve regurgitation is not visualized. No aortic stenosis is present. Aortic valve peak gradient measures 7.3 mmHg. Pulmonic Valve: The pulmonic valve was normal in structure. Pulmonic valve regurgitation is not visualized. No evidence of pulmonic stenosis. Aorta: The aortic root is normal in size and structure. Venous: The inferior vena cava is normal in size with greater than 50% respiratory variability, suggesting right atrial pressure of 3 mmHg. IAS/Shunts: No atrial level shunt detected by color flow Doppler.  LEFT VENTRICLE PLAX 2D LVIDd:         5.30 cm      Diastology LVIDs:         4.00 cm      LV  e' medial:    5.77 cm/s LV PW:         1.10 cm      LV E/e' medial:  12.5 LV IVS:        1.10 cm      LV e' lateral:   9.46 cm/s LVOT diam:     2.00 cm      LV E/e' lateral: 7.6 LV SV:         58 LV SV Index:   22 LVOT Area:     3.14 cm  LV Volumes (MOD) LV vol d, MOD A2C: 114.0 ml LV vol d, MOD A4C: 124.0 ml LV vol s, MOD A2C: 48.8 ml LV vol s, MOD A4C: 51.7 ml LV SV MOD A2C:     65.2 ml LV SV MOD A4C:     124.0 ml LV SV MOD BP:      67.5 ml RIGHT VENTRICLE             IVC RV Basal diam:  3.80 cm     IVC diam: 2.80 cm RV Mid diam:    3.90 cm RV S prime:     10.10 cm/s TAPSE (M-mode): 1.8 cm LEFT ATRIUM             Index        RIGHT ATRIUM           Index LA diam:        3.30 cm 1.24 cm/m   RA Area:     19.00 cm LA Vol (A2C):    53.7 ml 20.13 ml/m  RA Volume:   54.60 ml  20.47 ml/m LA Vol (A4C):   39.7 ml 14.88 ml/m LA Biplane Vol: 48.5 ml 18.18 ml/m  AORTIC VALVE AV Area (Vmax): 2.89 cm AV Vmax:        135.00 cm/s AV Peak Grad:   7.3 mmHg LVOT Vmax:      124.00 cm/s LVOT Vmean:     89.200 cm/s LVOT VTI:       0.185 m  AORTA Ao Root diam: 3.40 cm Ao Asc diam:  3.30 cm MITRAL VALVE MV Area (PHT): 3.95 cm    SHUNTS MV Decel Time: 192 msec    Systemic VTI:  0.18 m MV E velocity: 72.00 cm/s  Systemic Diam: 2.00 cm MV A velocity: 70.20 cm/s MV E/A ratio:  1.03 Donato SchultzMark Skains MD Electronically signed by Donato SchultzMark Skains MD Signature Date/Time: 07/15/2021/11:31:48 AM    Final    DG Chest Port 1V same Day  Result Date: 07/15/2021 CLINICAL DATA:  Shortness of breath, COPD, smoker EXAM: PORTABLE CHEST 1 VIEW COMPARISON:  Portable exam 0852 hours compared to 07/14/2021 FINDINGS: Enlargement of cardiac silhouette. Mediastinal contours normal. Low lung volumes with RIGHT basilar atelectasis and question small RIGHT pleural effusion. Upper lungs clear. No pneumothorax or acute osseous findings. IMPRESSION: Enlargement of cardiac silhouette. Low lung volumes with RIGHT basilar atelectasis and question small RIGHT pleural effusion. Electronically Signed   By: Ulyses SouthwardMark  Boles M.D.   On: 07/15/2021 09:08   DG Chest Portable 1 View  Result Date: 07/14/2021 CLINICAL DATA:  Shortness of breath, history COPD, smoker, hypertension diabetes EXAM: PORTABLE CHEST 1 VIEW COMPARISON:  01/13/2021 FINDINGS: Limited exam because of body habitus and portable technique. Patchy bibasilar opacities worse on the right, obscuring the right hemidiaphragm concerning for basilar pneumonia. Small effusion on the right not excluded. Heart is enlarged. No pneumothorax. Trachea midline. IMPRESSION: Bibasilar opacities, worse on the  right with associated small right effusion concerning for basilar pneumonia. Stable heart size and vascularity. Electronically Signed   By: Jerilynn Mages.  Shick M.D.    On: 07/14/2021 15:26     LOS: 2 days   Oren Binet, MD  [facility]    To contact the attending provider between 7A-7P or the covering provider during after hours 7P-7A, please log into the web site [email or web address] and access using universal [place] password for that web site. If you do not have the password, please call the hospital operator.  07/16/2021, 10:30 AM

## 2021-07-16 NOTE — Plan of Care (Signed)
  Problem: Clinical Measurements: Goal: Will remain free from infection Outcome: Progressing   Problem: Coping: Goal: Level of anxiety will decrease Outcome: Progressing   Problem: Elimination: Goal: Will not experience complications related to bowel motility Outcome: Progressing   Problem: Safety: Goal: Ability to remain free from injury will improve Outcome: Progressing   

## 2021-07-17 LAB — GLUCOSE, CAPILLARY
Glucose-Capillary: 250 mg/dL — ABNORMAL HIGH (ref 70–99)
Glucose-Capillary: 331 mg/dL — ABNORMAL HIGH (ref 70–99)
Glucose-Capillary: 372 mg/dL — ABNORMAL HIGH (ref 70–99)
Glucose-Capillary: 320 mg/dL — ABNORMAL HIGH (ref 70–99)

## 2021-07-17 LAB — BASIC METABOLIC PANEL
Anion gap: 16 — ABNORMAL HIGH (ref 5–15)
BUN: 20 mg/dL (ref 6–20)
CO2: 34 mmol/L — ABNORMAL HIGH (ref 22–32)
Calcium: 8.6 mg/dL — ABNORMAL LOW (ref 8.9–10.3)
Chloride: 88 mmol/L — ABNORMAL LOW (ref 98–111)
Creatinine, Ser: 0.86 mg/dL (ref 0.61–1.24)
GFR, Estimated: >60 mL/min (ref >60)
Glucose, Bld: 232 mg/dL — ABNORMAL HIGH (ref 70–99)
Potassium: 4.1 mmol/L (ref 3.5–5.1)
Sodium: 138 mmol/L (ref 135–145)

## 2021-07-17 MED ORDER — INSULIN ASPART 100 UNIT/ML IJ SOLN
0.0000 [IU] | Freq: Every day | INTRAMUSCULAR | Status: DC
  Administered 2021-07-17: 5 [IU] via SUBCUTANEOUS
  Administered 2021-07-19: 4 [IU] via SUBCUTANEOUS
  Administered 2021-07-20 – 2021-07-22 (×3): 2 [IU] via SUBCUTANEOUS

## 2021-07-17 MED ORDER — INSULIN ASPART 100 UNIT/ML IJ SOLN
0.0000 [IU] | Freq: Three times a day (TID) | INTRAMUSCULAR | Status: DC
  Administered 2021-07-18 (×2): 15 [IU] via SUBCUTANEOUS
  Administered 2021-07-19: 20 [IU] via SUBCUTANEOUS
  Administered 2021-07-19: 4 [IU] via SUBCUTANEOUS
  Administered 2021-07-19: 11 [IU] via SUBCUTANEOUS
  Administered 2021-07-20 (×2): 7 [IU] via SUBCUTANEOUS
  Administered 2021-07-20: 20 [IU] via SUBCUTANEOUS
  Administered 2021-07-21: 4 [IU] via SUBCUTANEOUS
  Administered 2021-07-21: 7 [IU] via SUBCUTANEOUS
  Administered 2021-07-21: 11 [IU] via SUBCUTANEOUS
  Administered 2021-07-22 (×2): 7 [IU] via SUBCUTANEOUS
  Administered 2021-07-22: 11 [IU] via SUBCUTANEOUS
  Administered 2021-07-23: 4 [IU] via SUBCUTANEOUS
  Administered 2021-07-23: 7 [IU] via SUBCUTANEOUS
  Administered 2021-07-23 – 2021-07-25 (×5): 4 [IU] via SUBCUTANEOUS
  Administered 2021-07-25: 2 [IU] via SUBCUTANEOUS
  Administered 2021-07-25 – 2021-07-26 (×2): 4 [IU] via SUBCUTANEOUS
  Administered 2021-07-26: 3 [IU] via SUBCUTANEOUS

## 2021-07-17 MED ORDER — INSULIN GLARGINE-YFGN 100 UNIT/ML ~~LOC~~ SOLN
16.0000 [IU] | Freq: Every day | SUBCUTANEOUS | Status: DC
  Administered 2021-07-18: 16 [IU] via SUBCUTANEOUS
  Filled 2021-07-17: qty 0.16

## 2021-07-17 NOTE — Evaluation (Signed)
Occupational Therapy Evaluation Patient Details Name: Raymond Hood MRN: [id number] DOB: 08-19-1970 Today's Date: 07/17/2021   History of Present Illness 51 y/o male presented to ED on 07/14/21 for SOB, cough, hypoxia. Found to have pneumonia. Admitted for acute on chronic respiratory failure with hypoxia. PMH: COPD, bipolar 1 disorder, T2DM, HTN, OSA, RA   Clinical Impression   PTA, pt currently staying at shelter, typically Modified Independent with ADLs and mobility using cane though difficult at times. Pt presents now with deficits in strength, cardiopulmonary tolerance, dynamic standing balance and cognition. On entry, pt in completely urine-soaked bed d/t primofit malfunction and had not called for assistance. Overall, pt requires Setup for UB ADLs, Mod A for LB ADLs for cleanup and min guard for transfer out of soiled bed to recliner. Began education on energy conservation strategies, UE HEP and planned AE education in next session to ease LB ADL abilities. Pt will need to increase mobility distance and independence with ADLs in order to DC back to shelter as postacute rehab likely unable to be obtained.   SpO2 >91% on 3 L O2, 2/4 DOE after ADLs/transfer     Recommendations for follow up therapy are one component of a multi-disciplinary discharge planning process, led by the attending physician.  Recommendations may be updated based on patient status, additional functional criteria and insurance authorization.   Follow Up Recommendations  No OT follow up (anticipate no OT follow up d/t no insurance for SNF rehab or HHOT)    Assistance Recommended at Discharge Set up Supervision/Assistance  Patient can return home with the following A little help with bathing/dressing/bathroom;Assistance with cooking/housework;Assist for transportation;Help with stairs or ramp for entrance    Functional Status Assessment  Patient has had a recent decline in their functional status and demonstrates the  ability to make significant improvements in function in a reasonable and predictable amount of time.  Equipment Recommendations  Other (comment) (TBD pending progress; would benefit from accesible shower chair at shelter)    Recommendations for Other Services       Precautions / Restrictions Precautions Precautions: Fall Precaution Comments: watch O2 Restrictions Weight Bearing Restrictions: No      Mobility Bed Mobility Overal bed mobility: Needs Assistance Bed Mobility: Supine to Sit     Supine to sit: Min assist, HOB elevated     General bed mobility comments: Min A to lift trunk, use of bedrail. cues for breathing as pt noted to hold breath and face turned red    Transfers Overall transfer level: Needs assistance Equipment used: 1 person hand held assist Transfers: Sit to/from Stand, Bed to chair/wheelchair/BSC Sit to Stand: Min guard     Step pivot transfers: Min guard     General transfer comment: slow, wide BOS but able to transfer to recliner from soiled bed      Balance Overall balance assessment: Needs assistance Sitting-balance support: No upper extremity supported, Feet supported Sitting balance-Leahy Scale: Good     Standing balance support: Single extremity supported, During functional activity Standing balance-Leahy Scale: Fair                             ADL either performed or assessed with clinical judgement   ADL Overall ADL's : Needs assistance/impaired Eating/Feeding: Independent   Grooming: Set up;Sitting;Wash/dry face   Upper Body Bathing: Set up;Sitting   Lower Body Bathing: Sit to/from stand;Moderate assistance Lower Body Bathing Details (indicate cue type and reason): assist  for lower LEs/bottom in standing Upper Body Dressing : Set up;Sitting Upper Body Dressing Details (indicate cue type and reason): able to assist in changing soiled gown, managing lines Lower Body Dressing: Moderate assistance;Sit to/from  stand;Sitting/lateral leans Lower Body Dressing Details (indicate cue type and reason): baseline difficulty reaching feet, plan to educate on AE and pt agreeable Toilet Transfer: Min guard;Stand-pivot;BSC/3in1   Toileting- Clothing Manipulation and Hygiene: Moderate assistance;Sitting/lateral lean;Sit to/from stand         General ADL Comments: Limited by cardiopulmonary deficits though noted improvements in O2 requirements since admission. began education on energy conservation (handout provided) with emphasis on breathing techniques and UE HEP (needs handout for this)     Vision Ability to See in Adequate Light: 1 Impaired Patient Visual Report: No change from baseline (reports difficulty seeing to read but has been able to read menu and order meals without issue. does not have glasses but feels like he needs them) Vision Assessment?: No apparent visual deficits     Perception     Praxis      Pertinent Vitals/Pain Pain Assessment Pain Assessment: No/denies pain     Hand Dominance Right   Extremity/Trunk Assessment Upper Extremity Assessment Upper Extremity Assessment: Overall WFL for tasks assessed   Lower Extremity Assessment Lower Extremity Assessment: Defer to PT evaluation   Cervical / Trunk Assessment Cervical / Trunk Assessment: Other exceptions Cervical / Trunk Exceptions: increased body habitus   Communication Communication Communication: No difficulties   Cognition Arousal/Alertness: Awake/alert Behavior During Therapy: WFL for tasks assessed/performed, Flat affect Overall Cognitive Status: No family/caregiver present to determine baseline cognitive functioning                                 General Comments: hx of schizoaffective/bipolar disoder. flat affect but pleasant and follows directions. not forthcoming with information at times, decreased awareness of deficits/situation (was in completely urine-soaked bed on entry but had not called  for assist)     General Comments  Discussed with RN regarding droplet precautions in systemt though not indicated in pt room. RN plans to remove from system as pt negative for associated illnesses    Exercises     Shoulder Instructions      Home Living Family/patient expects to be discharged to:: Shelter/Homeless                                 Additional Comments: Currently at [facility] homeless shelter      Prior Functioning/Environment Prior Level of Function : Independent/Modified Independent             Mobility Comments: uses cane for mobility, primarily sedentary ADLs Comments: Able to complete ADLs though difficulty standing for duration of shower, difficulty managing socks at times. reports he has to walk a long distance to get to bathroom, accesss to shower stall with shower chair is an even further walk for pt        OT Problem List: Decreased strength;Decreased activity tolerance;Impaired balance (sitting and/or standing);Cardiopulmonary status limiting activity;Decreased knowledge of use of DME or AE;Obesity      OT Treatment/Interventions: Self-care/ADL training;Therapeutic exercise;Energy conservation;DME and/or AE instruction;Therapeutic activities;Patient/family education    OT Goals(Current goals can be found in the care plan section) Acute Rehab OT Goals Patient Stated Goal: eat some lunch OT Goal Formulation: With patient Time For Goal Achievement: 07/31/21  Potential to Achieve Goals: Good  OT Frequency: Min 2X/week    Co-evaluation              AM-PAC OT "6 Clicks" Daily Activity     Outcome Measure Help from another person eating meals?: None Help from another person taking care of personal grooming?: A Little Help from another person toileting, which includes using toliet, bedpan, or urinal?: A Lot Help from another person bathing (including washing, rinsing, drying)?: A Lot Help from another person to put on and  taking off regular upper body clothing?: A Little Help from another person to put on and taking off regular lower body clothing?: A Lot 6 Click Score: 16   End of Session Equipment Utilized During Treatment: Oxygen Nurse Communication: Mobility status;Other (comment) (soiled bed, primofit malfunction, and need for new leads)  Activity Tolerance: Patient tolerated treatment well Patient left: in chair;with call bell/phone within reach;with chair alarm set  OT Visit Diagnosis: Unsteadiness on feet (R26.81)                Time: 8416-6063 OT Time Calculation (min): 33 min Charges:  OT General Charges $OT Visit: 1 Visit OT Evaluation $OT Eval Moderate Complexity: 1 Mod OT Treatments $Self Care/Home Management : 8-22 mins  Bradd Canary, OTR/L Acute Rehab Services Office: [phone]   Lorre Munroe 07/17/2021, 1:03 PM

## 2021-07-17 NOTE — Plan of Care (Signed)
  Problem: Education: Goal: Ability to describe self-care measures that may prevent or decrease complications (Diabetes Survival Skills Education) will improve Outcome: Progressing   Problem: Coping: Goal: Ability to adjust to condition or change in health will improve Outcome: Progressing   Problem: Fluid Volume: Goal: Ability to maintain a balanced intake and output will improve Outcome: Progressing   Problem: Respiratory: Goal: Ability to maintain adequate ventilation will improve Outcome: Progressing Goal: Ability to maintain a clear airway will improve Outcome: Progressing   Problem: Safety: Goal: Ability to remain free from injury will improve Outcome: Progressing

## 2021-07-17 NOTE — Progress Notes (Signed)
PROGRESS NOTE        PATIENT DETAILS Name: Raymond Hood Age: 51 y.o. Sex: male Date of Birth: [birth year] Admit Date: 07/14/2021 Admitting Physician Orma Flaming, MD [id number], Burnett Harry, MD  Brief Summary: Patient is a 51 y.o.  male with history of COPD, OSA/OHS, HFpEF, HTN, DM-2, schizoaffective/bipolar disorder, homelessness, ongoing tobacco use-presented with several days history of worsening shortness of breath (seen by PCP recently-started on steroids/cefdinir/Lasix dose escalated)-found to have acute hypoxic respiratory failure due to PNA and decompensated heart failure.  See below for further details.   Significant events: 7/5>> admit to [facility]-hypoxia-PNA/HFpEF exacerbation-started on BiPAP 7/6>> liberated off BiPAP-transition to 8 L HFNC.  Significant studies: 7/5>> CXR: Bibasilar opacities 7/5>> CXR: Right basilar atelectasis-?  Right pleural effusion.  Significant microbiology data: 7/5>> COVID/influenza PCR: Negative 7/5>> respiratory virus panel: Negative 7/5>> blood culture: No growth 7/6>> blood culture: Pending  Procedures:   Consults: None  Subjective: Feels better-tolerated BiPAP last night without any major issues.  Down to 3 L of oxygen today.  Objective: Vitals: Blood pressure 130/86, pulse 85, temperature 99.2 F (37.3 C), resp. rate 18, height 5\' 8"  (1.727 m), weight (!) 165.7 kg, SpO2 96 %.   Exam: Gen Exam:Alert awake-not in any distress HEENT:atraumatic, normocephalic Chest: Diminished air entry bilaterally-still wheezing. CVS:S1S2 regular Abdomen:soft non tender, non distended Extremities:++ edema Neurology: Non focal Skin: no rash   Pertinent Labs/Radiology:    Latest Ref Rng & Units 07/16/2021    1:57 AM 07/15/2021    3:16 AM 07/15/2021    1:47 AM  CBC  WBC 4.0 - 10.5 K/uL 13.4  13.1    Hemoglobin 13.0 - 17.0 g/dL 15.0  15.5  17.0   Hematocrit 39.0 - 52.0 % 51.2  53.2  50.0   Platelets 150 - 400 K/uL 232   221      Lab Results  Component Value Date   NA 138 07/17/2021   K 4.1 07/17/2021   CL 88 (L) 07/17/2021   CO2 34 (H) 07/17/2021    Assessment/Plan: Acute hypoxic/hypercarbic respiratory failure: Etiology felt to be multifactorial from HFpEF exacerbation-COPD exacerbation/PNA/OSA/OHS.  Overall better-down to 3 L of oxygen this morning (yesterday was on 5 L) continue IV Lasix/scheduled bronchodilators/steroids/IV antibiotics.    Sepsis due to PNA: Sepsis physiology resolved-hypoxia improving-continue IV antibiotics-follow cultures.   HFpEF exacerbation: Volume status better-still with 2+ pitting edema-continue IV furosemide for another day-follow weights/electrolytes/intake/output.    COPD exacerbation: Although better-still wheezing-continue bronchodilators and IV Solu-Medrol-suspect could be transitioned to prednisone tomorrow.   OHS/OSA: Claims that he has a NIV at his shelter-unclear how compliant he is to this-needs to continue NIV/BiPAP nightly and whenever sleeping.   Acute bilateral conjunctivitis: Continue erythromycin ointment.  DM-2.  (A1c 8.3 on 5/23): CBGs remain in the high side-increase Semglee to 16 units-continue SSI.  Suspect as steroids get tapered down-will require less amount of insulin.    Recent Labs    07/16/21 1547 07/16/21 2149 07/17/21 0756  GLUCAP 360* 213* 250*     HLD: Continue statin  Schizoaffective disorder-bipolar type/anxiety/depression: Continue risperidone, Cymbalta-holding Neurontin until hypercarbia improves further.  Tobacco abuse: Continue transdermal nicotine  Debility/deconditioning: Continue to mobilize with PT/OT-have asked nursing staff to get him out of bed to chair today.  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   Weight as of this encounter: 165.7 kg.   Code status:    Code Status: Full Code   DVT Prophylaxis: Prophylactic Lovenox   Family Communication: None at bedside (per patient he has no family members)   Disposition Plan: Status is: Inpatient Remains inpatient appropriate because: Improving hypoxia/hypercarbia due to CHF/PNA-not yet stable for discharge.     Planned Discharge Destination:Home probably sometime next week.   Diet: Diet Order             Diet heart healthy/carb modified Room service appropriate? Yes; Fluid consistency: Thin  Diet effective now                     Antimicrobial agents: Anti-infectives (From admission, onward)    Start     Dose/Rate Route Frequency Ordered Stop   07/15/21 1545  cefTRIAXone (ROCEPHIN) 2 g in sodium chloride 0.9 % 100 mL IVPB        2 g 200 mL/hr over 30 Minutes Intravenous Every 24 hours 07/14/21 1841     07/14/21 2200  doxycycline (VIBRA-TABS) tablet 100 mg        100 mg Oral Every 12 hours 07/14/21 1920     07/14/21 1545  cefTRIAXone (ROCEPHIN) 2 g in sodium chloride 0.9 % 100 mL IVPB        2 g 200 mL/hr over 30 Minutes Intravenous  Once 07/14/21 1540 07/14/21 1702   07/14/21 1545  azithromycin (ZITHROMAX) 500 mg in sodium chloride 0.9 % 250 mL IVPB  Status:  Discontinued        500 mg 250 mL/hr over 60 Minutes Intravenous Every 24 hours 07/14/21 1540 07/14/21 1920        MEDICATIONS: Scheduled Meds:  atorvastatin  10 mg Oral Daily   budesonide (PULMICORT) nebulizer solution  0.25 mg Nebulization BID   doxycycline  100 mg Oral Q12H   DULoxetine  60 mg Oral Daily   enoxaparin (LOVENOX) injection  85 mg Subcutaneous Q24H   erythromycin   Both Eyes Q6H   famotidine  20 mg Oral BID   fluticasone  2 spray Each Nare Daily   furosemide  80 mg Intravenous BID   guaiFENesin  600 mg Oral BID   insulin aspart  0-20 Units Subcutaneous TID WC   insulin glargine-yfgn  12 Units Subcutaneous Daily   ipratropium-albuterol  3 mL Nebulization Q6H   methylPREDNISolone (SOLU-MEDROL)  injection  40 mg Intravenous Q12H   nicotine  21 mg Transdermal Daily   risperidone  4 mg Oral Daily   sodium chloride flush  3 mL Intravenous Q12H   tamsulosin  0.4 mg Oral Daily   Continuous Infusions:  sodium chloride     cefTRIAXone (ROCEPHIN)  IV 2 g (07/16/21 1525)   PRN Meds:.sodium chloride, acetaminophen **OR** acetaminophen, albuterol, sodium chloride flush   I have personally reviewed following labs and imaging studies  LABORATORY DATA: CBC: Recent Labs  Lab 07/14/21 1550 07/14/21 1557 07/15/21 0147 07/15/21 0316 07/16/21 0157  WBC 12.0*  --   --  13.1* 13.4*  HGB 15.6 16.7 17.0 15.5 15.0  HCT 54.1* 49.0 50.0 53.2* 51.2  MCV 92.8  --   --  91.4 88.3  PLT 268  --   --  221 232     Basic Metabolic Panel: Recent Labs  Lab 07/14/21 1550 07/14/21 1557 07/15/21 0147 07/15/21 0316 07/16/21 0157  07/17/21 0155  NA 145 142 143 146* 144 138  K 3.9 3.6 3.9 3.9 3.9 4.1  CL 99  --   --  99 93* 88*  CO2 33*  --   --  37* 41* 34*  GLUCOSE 151*  --   --  157* 180* 232*  BUN 13  --   --  11 14 20   CREATININE 1.09  --   --  1.01 0.91 0.86  CALCIUM 7.8*  --   --  7.7* 8.0* 8.6*  MG  --   --   --  2.2  --   --      GFR: Estimated Creatinine Clearance: 159.4 mL/min (by C-G formula based on SCr of 0.86 mg/dL).  Liver Function Tests: Recent Labs  Lab 07/14/21 1550  AST 20  ALT 29  ALKPHOS 77  BILITOT 0.6  PROT 6.0*  ALBUMIN 2.8*    No results for input(s): "LIPASE", "AMYLASE" in the last 168 hours. No results for input(s): "AMMONIA" in the last 168 hours.  Coagulation Profile: No results for input(s): "INR", "PROTIME" in the last 168 hours.  Cardiac Enzymes: No results for input(s): "CKTOTAL", "CKMB", "CKMBINDEX", "TROPONINI" in the last 168 hours.  BNP (last 3 results) No results for input(s): "PROBNP" in the last 8760 hours.  Lipid Profile: No results for input(s): "CHOL", "HDL", "LDLCALC", "TRIG", "CHOLHDL", "LDLDIRECT" in the last 72  hours.  Thyroid Function Tests: No results for input(s): "TSH", "T4TOTAL", "FREET4", "T3FREE", "THYROIDAB" in the last 72 hours.  Anemia Panel: No results for input(s): "VITAMINB12", "FOLATE", "FERRITIN", "TIBC", "IRON", "RETICCTPCT" in the last 72 hours.  Urine analysis:    Component Value Date/Time   COLORURINE YELLOW (A) 11/16/2020 2238   APPEARANCEUR HAZY (A) 11/16/2020 2238   LABSPEC >1.046 (H) 11/16/2020 2238   PHURINE 5.0 11/16/2020 2238   GLUCOSEU NEGATIVE 11/16/2020 2238   HGBUR NEGATIVE 11/16/2020 2238   [place] NEGATIVE 11/16/2020 2238   [place] 11/16/2020 2238   PROTEINUR NEGATIVE 11/16/2020 2238   NITRITE NEGATIVE 11/16/2020 2238   LEUKOCYTESUR NEGATIVE 11/16/2020 2238    Sepsis Labs: Lactic Acid, Venous    Component Value Date/Time   LATICACIDVEN 1.6 07/14/2021 1724    MICROBIOLOGY: Recent Results (from the past 240 hour(s))  Resp Panel by RT-PCR (Flu A&B, Covid) Anterior Nasal Swab     Status: None   Collection Time: 07/14/21  3:11 PM   Specimen: Anterior Nasal Swab  Result Value Ref Range Status   SARS Coronavirus 2 by RT PCR NEGATIVE NEGATIVE Final    Comment: (NOTE) SARS-CoV-2 target nucleic acids are NOT DETECTED.  The SARS-CoV-2 RNA is generally detectable in upper respiratory specimens during the acute phase of infection. The lowest concentration of SARS-CoV-2 viral copies this assay can detect is 138 copies/mL. A negative result does not preclude SARS-Cov-2 infection and should not be used as the sole basis for treatment or other patient management decisions. A negative result may occur with  improper specimen collection/handling, submission of specimen other than nasopharyngeal swab, presence of viral mutation(s) within the areas targeted by this assay, and inadequate number of viral copies(<138 copies/mL). A negative result must be combined with clinical observations, patient history, and epidemiological information. The  expected result is Negative.  Fact Sheet for Patients:  EntrepreneurPulse.com.au  Fact Sheet for Healthcare Providers:  IncredibleEmployment.be  This test is no t yet approved or cleared by the [place] FDA and  has been authorized for detection and/or diagnosis of SARS-CoV-2 by  FDA under an Emergency Use Authorization (EUA). This EUA will remain  in effect (meaning this test can be used) for the duration of the COVID-19 declaration under Section 564(b)(1) of the Act, 21 U.S.C.section 360bbb-3(b)(1), unless the authorization is terminated  or revoked sooner.       Influenza A by PCR NEGATIVE NEGATIVE Final   Influenza B by PCR NEGATIVE NEGATIVE Final    Comment: (NOTE) The Xpert Xpress SARS-CoV-2/FLU/RSV plus assay is intended as an aid in the diagnosis of influenza from Nasopharyngeal swab specimens and should not be used as a sole basis for treatment. Nasal washings and aspirates are unacceptable for Xpert Xpress SARS-CoV-2/FLU/RSV testing.  Fact Sheet for Patients: BloggerCourse.com  Fact Sheet for Healthcare Providers: SeriousBroker.it  This test is not yet approved or cleared by the [place] FDA and has been authorized for detection and/or diagnosis of SARS-CoV-2 by FDA under an Emergency Use Authorization (EUA). This EUA will remain in effect (meaning this test can be used) for the duration of the COVID-19 declaration under Section 564(b)(1) of the Act, 21 U.S.C. section 360bbb-3(b)(1), unless the authorization is terminated or revoked.  Performed at [facility], [address] [place], [place] [address]   Respiratory (~20 pathogens) panel by PCR     Status: None   Collection Time: 07/14/21  3:11 PM   Specimen: Nasopharyngeal Swab; Respiratory  Result Value Ref Range Status   Adenovirus NOT DETECTED NOT DETECTED Final   Coronavirus 229E NOT DETECTED NOT DETECTED  Final    Comment: (NOTE) The Coronavirus on the Respiratory Panel, DOES NOT test for the novel  Coronavirus (2019 nCoV)    Coronavirus HKU1 NOT DETECTED NOT DETECTED Final   Coronavirus NL63 NOT DETECTED NOT DETECTED Final   Coronavirus OC43 NOT DETECTED NOT DETECTED Final   Metapneumovirus NOT DETECTED NOT DETECTED Final   Rhinovirus / Enterovirus NOT DETECTED NOT DETECTED Final   Influenza A NOT DETECTED NOT DETECTED Final   Influenza B NOT DETECTED NOT DETECTED Final   Parainfluenza Virus 1 NOT DETECTED NOT DETECTED Final   Parainfluenza Virus 2 NOT DETECTED NOT DETECTED Final   Parainfluenza Virus 3 NOT DETECTED NOT DETECTED Final   Parainfluenza Virus 4 NOT DETECTED NOT DETECTED Final   Respiratory Syncytial Virus NOT DETECTED NOT DETECTED Final   Bordetella pertussis NOT DETECTED NOT DETECTED Final   Bordetella Parapertussis NOT DETECTED NOT DETECTED Final   Chlamydophila pneumoniae NOT DETECTED NOT DETECTED Final   Mycoplasma pneumoniae NOT DETECTED NOT DETECTED Final    Comment: Performed at [facility], [address], [place], [place] [address]  Culture, blood (routine x 2)     Status: None (Preliminary result)   Collection Time: 07/14/21  4:50 PM   Specimen: BLOOD LEFT HAND  Result Value Ref Range Status   Specimen Description BLOOD LEFT HAND  Final   Special Requests   Final    BOTTLES DRAWN AEROBIC AND ANAEROBIC Blood Culture adequate volume   Culture   Final    NO GROWTH 3 DAYS Performed at [facility], [address] [place], [place] [address]    Report Status PENDING  Incomplete  Culture, blood (routine x 2)     Status: None (Preliminary result)   Collection Time: 07/15/21  5:58 AM   Specimen: BLOOD  Result Value Ref Range Status   Specimen Description BLOOD RIGHT ANTECUBITAL  Final   Special Requests   Final    BOTTLES DRAWN AEROBIC AND  ANAEROBIC Blood Culture adequate volume   Culture   Final    NO GROWTH 2 DAYS Performed at [facility], [place] [address], [place], [place] [address]    Report Status PENDING  Incomplete    RADIOLOGY STUDIES/RESULTS: ECHOCARDIOGRAM COMPLETE  Result Date: 07/15/2021    ECHOCARDIOGRAM REPORT   Patient Name:   Raymond Hood Date of Exam: 07/15/2021 Medical Rec #:  [id number]     Height:       68.0 in Accession #:    [id number]    Weight:       374.3 lb Date of Birth:  [birth year]     BSA:          2.668 m Patient Age:    34 years      BP:           127/82 mmHg Patient Gender: M             HR:           92 bpm. Exam Location:  Inpatient Procedure: 2D Echo, Cardiac Doppler, Color Doppler and Intracardiac            Opacification Agent Indications:    CHF  History:        Patient has prior history of Echocardiogram examinations. COPD;                 Risk Factors:Hypertension, Diabetes, Sleep Apnea and Current                 Smoker.  Sonographer:    Jyl Heinz Referring Phys: Orma Flaming  Sonographer Comments: Patient is morbidly obese. Image acquisition challenging due to patient body habitus. Pt sitting up, on BiPAP IMPRESSIONS  1. Left ventricular ejection fraction, by estimation, is 55 to 60%. The left ventricle has normal function. The left ventricle has no regional wall motion abnormalities. Left ventricular diastolic parameters are consistent with Grade I diastolic dysfunction (impaired relaxation).  2. Right ventricular systolic function is normal. The right ventricular size is normal.  3. The mitral valve is normal in structure. No evidence of mitral valve regurgitation. No evidence of mitral stenosis.  4. The aortic valve is tricuspid. Aortic valve regurgitation is not visualized. No aortic stenosis is present.  5. The inferior vena cava is normal in size with greater than 50% respiratory variability, suggesting right atrial pressure of 3 mmHg. Comparison(s): No significant change from prior study. Prior images reviewed side by side. FINDINGS  Left Ventricle: Left ventricular ejection fraction, by  estimation, is 55 to 60%. The left ventricle has normal function. The left ventricle has no regional wall motion abnormalities. Definity contrast agent was given IV to delineate the left ventricular  endocardial borders. The left ventricular internal cavity size was normal in size. There is no left ventricular hypertrophy. Left ventricular diastolic parameters are consistent with Grade I diastolic dysfunction (impaired relaxation). Right Ventricle: The right ventricular size is normal. No increase in right ventricular wall thickness. Right ventricular systolic function is normal. Left Atrium: Left atrial size was normal in size. Right Atrium: Right atrial size was normal in size. Pericardium: There is no evidence of pericardial effusion. Mitral Valve: The mitral valve is normal in structure. No evidence of mitral valve regurgitation. No evidence of mitral valve stenosis. Tricuspid Valve: The tricuspid valve is normal in structure. Tricuspid valve regurgitation is trivial. No evidence of tricuspid stenosis. Aortic Valve: The aortic valve is tricuspid. Aortic valve regurgitation is not visualized. No  aortic stenosis is present. Aortic valve peak gradient measures 7.3 mmHg. Pulmonic Valve: The pulmonic valve was normal in structure. Pulmonic valve regurgitation is not visualized. No evidence of pulmonic stenosis. Aorta: The aortic root is normal in size and structure. Venous: The inferior vena cava is normal in size with greater than 50% respiratory variability, suggesting right atrial pressure of 3 mmHg. IAS/Shunts: No atrial level shunt detected by color flow Doppler.  LEFT VENTRICLE PLAX 2D LVIDd:         5.30 cm      Diastology LVIDs:         4.00 cm      LV e' medial:    5.77 cm/s LV PW:         1.10 cm      LV E/e' medial:  12.5 LV IVS:        1.10 cm      LV e' lateral:   9.46 cm/s LVOT diam:     2.00 cm      LV E/e' lateral: 7.6 LV SV:         58 LV SV Index:   22 LVOT Area:     3.14 cm  LV Volumes (MOD) LV  vol d, MOD A2C: 114.0 ml LV vol d, MOD A4C: 124.0 ml LV vol s, MOD A2C: 48.8 ml LV vol s, MOD A4C: 51.7 ml LV SV MOD A2C:     65.2 ml LV SV MOD A4C:     124.0 ml LV SV MOD BP:      67.5 ml RIGHT VENTRICLE             IVC RV Basal diam:  3.80 cm     IVC diam: 2.80 cm RV Mid diam:    3.90 cm RV S prime:     10.10 cm/s TAPSE (M-mode): 1.8 cm LEFT ATRIUM             Index        RIGHT ATRIUM           Index LA diam:        3.30 cm 1.24 cm/m   RA Area:     19.00 cm LA Vol (A2C):   53.7 ml 20.13 ml/m  RA Volume:   54.60 ml  20.47 ml/m LA Vol (A4C):   39.7 ml 14.88 ml/m LA Biplane Vol: 48.5 ml 18.18 ml/m  AORTIC VALVE AV Area (Vmax): 2.89 cm AV Vmax:        135.00 cm/s AV Peak Grad:   7.3 mmHg LVOT Vmax:      124.00 cm/s LVOT Vmean:     89.200 cm/s LVOT VTI:       0.185 m  AORTA Ao Root diam: 3.40 cm Ao Asc diam:  3.30 cm MITRAL VALVE MV Area (PHT): 3.95 cm    SHUNTS MV Decel Time: 192 msec    Systemic VTI:  0.18 m MV E velocity: 72.00 cm/s  Systemic Diam: 2.00 cm MV A velocity: 70.20 cm/s MV E/A ratio:  1.03 Candee Furbish MD Electronically signed by Candee Furbish MD Signature Date/Time: 07/15/2021/11:31:48 AM    Final      LOS: 3 days   Oren Binet, MD  [facility]    To contact the attending provider between 7A-7P or the covering provider during after hours 7P-7A, please log into the web site [email or web address] and access using universal [place] password for that web site. If you do not have the password, please  call the hospital operator.  07/17/2021, 10:48 AM

## 2021-07-18 ENCOUNTER — Encounter (HOSPITAL_COMMUNITY): Payer: Self-pay | Admitting: Family Medicine

## 2021-07-18 ENCOUNTER — Other Ambulatory Visit: Payer: Self-pay

## 2021-07-18 LAB — BASIC METABOLIC PANEL
Anion gap: 13 (ref 5–15)
BUN: 27 mg/dL — ABNORMAL HIGH (ref 6–20)
CO2: 38 mmol/L — ABNORMAL HIGH (ref 22–32)
Calcium: 9 mg/dL (ref 8.9–10.3)
Chloride: 87 mmol/L — ABNORMAL LOW (ref 98–111)
Creatinine, Ser: 0.96 mg/dL (ref 0.61–1.24)
GFR, Estimated: >60 mL/min (ref >60)
Glucose, Bld: 310 mg/dL — ABNORMAL HIGH (ref 70–99)
Potassium: 4 mmol/L (ref 3.5–5.1)
Sodium: 138 mmol/L (ref 135–145)

## 2021-07-18 LAB — GLUCOSE, CAPILLARY
Glucose-Capillary: 334 mg/dL — ABNORMAL HIGH (ref 70–99)
Glucose-Capillary: 342 mg/dL — ABNORMAL HIGH (ref 70–99)
Glucose-Capillary: 344 mg/dL — ABNORMAL HIGH (ref 70–99)
Glucose-Capillary: 383 mg/dL — ABNORMAL HIGH (ref 70–99)
Glucose-Capillary: 409 mg/dL — ABNORMAL HIGH (ref 70–99)
Glucose-Capillary: 467 mg/dL — ABNORMAL HIGH (ref 70–99)
Glucose-Capillary: 479 mg/dL — ABNORMAL HIGH (ref 70–99)

## 2021-07-18 MED ORDER — ACETAZOLAMIDE 250 MG PO TABS
250.0000 mg | ORAL_TABLET | Freq: Two times a day (BID) | ORAL | Status: DC
  Administered 2021-07-18 (×2): 250 mg via ORAL
  Filled 2021-07-18 (×3): qty 1

## 2021-07-18 MED ORDER — PREDNISONE 20 MG PO TABS
40.0000 mg | ORAL_TABLET | Freq: Every day | ORAL | Status: DC
  Administered 2021-07-18: 40 mg via ORAL
  Filled 2021-07-18: qty 2

## 2021-07-18 MED ORDER — INSULIN GLARGINE-YFGN 100 UNIT/ML ~~LOC~~ SOLN
20.0000 [IU] | Freq: Every day | SUBCUTANEOUS | Status: DC
  Filled 2021-07-18: qty 0.2

## 2021-07-18 MED ORDER — ORAL CARE MOUTH RINSE: 15.0000 mL | OROMUCOSAL | Status: DC | PRN

## 2021-07-18 MED ORDER — ARFORMOTEROL TARTRATE 15 MCG/2ML IN NEBU
15.0000 ug | INHALATION_SOLUTION | Freq: Two times a day (BID) | RESPIRATORY_TRACT | Status: DC
  Administered 2021-07-18 – 2021-07-22 (×8): 15 ug via RESPIRATORY_TRACT
  Filled 2021-07-18 (×8): qty 2

## 2021-07-18 MED ORDER — IPRATROPIUM-ALBUTEROL 0.5-2.5 (3) MG/3ML IN SOLN
3.0000 mL | Freq: Two times a day (BID) | RESPIRATORY_TRACT | Status: DC
Start: 2021-07-18 — End: 2021-07-19
  Administered 2021-07-18 – 2021-07-19 (×2): 3 mL via RESPIRATORY_TRACT
  Filled 2021-07-18 (×2): qty 3

## 2021-07-18 MED ORDER — ORAL CARE MOUTH RINSE
15.0000 mL | OROMUCOSAL | Status: DC
  Administered 2021-07-18 – 2021-07-25 (×13): 15 mL via OROMUCOSAL

## 2021-07-18 MED ORDER — REVEFENACIN 175 MCG/3ML IN SOLN
175.0000 ug | Freq: Every day | RESPIRATORY_TRACT | Status: DC
  Administered 2021-07-18 – 2021-07-25 (×8): 175 ug via RESPIRATORY_TRACT
  Filled 2021-07-18 (×10): qty 3

## 2021-07-18 MED ORDER — INSULIN ASPART 100 UNIT/ML IJ SOLN
4.0000 [IU] | Freq: Three times a day (TID) | INTRAMUSCULAR | Status: DC
  Administered 2021-07-18: 4 [IU] via SUBCUTANEOUS

## 2021-07-18 MED ORDER — INSULIN ASPART 100 UNIT/ML IJ SOLN
25.0000 [IU] | Freq: Once | INTRAMUSCULAR | Status: AC
Start: 2021-07-18 — End: 2021-07-18
  Administered 2021-07-18: 25 [IU] via SUBCUTANEOUS

## 2021-07-18 MED ORDER — INSULIN ASPART 100 UNIT/ML IJ SOLN
15.0000 [IU] | Freq: Once | INTRAMUSCULAR | Status: AC
  Administered 2021-07-18: 15 [IU] via SUBCUTANEOUS

## 2021-07-18 MED ORDER — ENOXAPARIN SODIUM 80 MG/0.8ML IJ SOSY
80.0000 mg | PREFILLED_SYRINGE | INTRAMUSCULAR | Status: DC
  Administered 2021-07-18 – 2021-07-25 (×8): 80 mg via SUBCUTANEOUS
  Filled 2021-07-18 (×9): qty 0.8

## 2021-07-18 MED ORDER — INSULIN ASPART 100 UNIT/ML IJ SOLN
20.0000 [IU] | Freq: Once | INTRAMUSCULAR | Status: AC
Start: 2021-07-18 — End: 2021-07-18
  Administered 2021-07-18: 20 [IU] via SUBCUTANEOUS

## 2021-07-18 NOTE — Plan of Care (Signed)
  Problem: Metabolic: Goal: Ability to maintain appropriate glucose levels will improve Outcome: Not Progressing   Problem: Skin Integrity: Goal: Risk for impaired skin integrity will decrease Outcome: Progressing   Problem: Education: Goal: Ability to verbalize understanding of medication therapies will improve Outcome: Progressing   Problem: Activity: Goal: Capacity to carry out activities will improve Outcome: Progressing   Problem: Activity: Goal: Ability to tolerate increased activity will improve Outcome: Progressing

## 2021-07-18 NOTE — Progress Notes (Signed)
PROGRESS NOTE        PATIENT DETAILS Name: Raymond Hood Age: 51 y.o. Sex: male Date of Birth: [birth year] Admit Date: 07/14/2021 Admitting Physician Orland Mustard, MD [id number], Charlcie Cradle, MD  Brief Summary: Patient is a 51 y.o.  male with history of COPD, OSA/OHS, HFpEF, HTN, DM-2, schizoaffective/bipolar disorder, homelessness, ongoing tobacco use-presented with several days history of worsening shortness of breath (seen by PCP recently-started on steroids/cefdinir/Lasix dose escalated)-found to have acute hypoxic respiratory failure due to PNA and decompensated heart failure.  See below for further details.   Significant events: 7/5>> admit to [facility]-hypoxia-PNA/HFpEF exacerbation-started on BiPAP 7/6>> liberated off BiPAP-transition to 8 L HFNC.  Significant studies: 7/5>> CXR: Bibasilar opacities 7/5>> CXR: Right basilar atelectasis-?  Right pleural effusion.  Significant microbiology data: 7/5>> COVID/influenza PCR: Negative 7/5>> respiratory virus panel: Negative 7/5>> blood culture: No growth 7/6>> blood culture: Pending  Procedures:   Consults: None  Subjective: Slowly improving-no major issues overnight.  Tolerated BiPAP-acknowledges improvement-still with significant lower extremity edema.  Objective: Vitals: Blood pressure (!) 118/91, pulse 97, temperature 97.6 F (36.4 C), temperature source Oral, resp. rate 20, height  (1.727 m), weight (!) 162.7 kg, SpO2 92 %.   Exam: Gen Exam:Alert awake-not in any distress HEENT:atraumatic, normocephalic Chest: Few scattered rhonchi CVS:S1S2 regular Abdomen:soft non tender, non distended Extremities: Continues to have 2+ edema Neurology: Non focal Skin: no rash   Pertinent Labs/Radiology:    Latest Ref Rng & Units 07/16/2021    1:57 AM 07/15/2021    3:16 AM 07/15/2021    1:47 AM  CBC  WBC 4.0 - 10.5 K/uL 13.4  13.1    Hemoglobin 13.0 - 17.0 g/dL 98.1  19.1  47.8   Hematocrit 39.0 -  52.0 % 51.2  53.2  50.0   Platelets 150 - 400 K/uL 232  221      Lab Results  Component Value Date   NA 138 07/18/2021   K 4.0 07/18/2021   CL 87 (L) 07/18/2021   CO2 38 (H) 07/18/2021    Assessment/Plan: Acute hypoxic/hypercarbic respiratory failure: Etiology felt to be multifactorial from HFpEF exacerbation-COPD exacerbation/PNA/OSA/OHS.  Overall improving-remains on IV diuretics/steroids/bronchodilators and IV antibiotics   Sepsis due to PNA: Sepsis physiology resolved-hypoxia improving-continue IV antibiotics-follow cultures.   HFpEF exacerbation: Volume status overall better but still overloaded-continue with IV Lasix 80 mg twice daily-follow weights/electrolytes/intake/output.    COPD exacerbation: Improved-switch to prednisone-continue bronchodilators.   OHS/OSA: Claims that he has a NIV at his shelter-unclear how compliant he is to this-needs to continue NIV/BiPAP nightly and whenever sleeping.   Acute bilateral conjunctivitis: Continue erythromycin ointment.  DM-2.  (A1c 8.3 on 5/23) with uncontrolled hyperglycemia due to steroids: Increase Semglee to 20 units-add 4 units of NovoLog with meals-continue SSI-suspect has steroids get tapered down-will require less amount of insulin.    Recent Labs    07/18/21 0050 07/18/21 0932 07/18/21 1126  GLUCAP 342* 344* 334*     HLD: Continue statin  Schizoaffective disorder-bipolar type/anxiety/depression: Continue risperidone, Cymbalta-holding Neurontin until hypercarbia improves further.  Tobacco abuse: Continue transdermal nicotine  Debility/deconditioning: Continue to mobilize with PT/OT.  Homelessness: Child psychotherapist evaluation prior to discharge  Morbid Obesity: Estimated body mass index is 54.54 kg/m as calculated from the following:   Height as of this encounter:  (1.727 m).   Weight as of this encounter:  162.7 kg.   Code status:   Code Status: Full Code   DVT Prophylaxis: Prophylactic Lovenox   Family  Communication: None at bedside (per patient he has no family members)   Disposition Plan: Status is: Inpatient Remains inpatient appropriate because: Improving hypoxia/hypercarbia due to CHF/PNA-not yet stable for discharge.     Planned Discharge Destination:Home probably in the next 2-3 days.   Diet: Diet Order             Diet heart healthy/carb modified Room service appropriate? Yes; Fluid consistency: Thin  Diet effective now                     Antimicrobial agents: Anti-infectives (From admission, onward)    Start     Dose/Rate Route Frequency Ordered Stop   07/15/21 1545  cefTRIAXone (ROCEPHIN) 2 g in sodium chloride 0.9 % 100 mL IVPB        2 g 200 mL/hr over 30 Minutes Intravenous Every 24 hours 07/14/21 1841     07/14/21 2200  doxycycline (VIBRA-TABS) tablet 100 mg        100 mg Oral Every 12 hours 07/14/21 1920     07/14/21 1545  cefTRIAXone (ROCEPHIN) 2 g in sodium chloride 0.9 % 100 mL IVPB        2 g 200 mL/hr over 30 Minutes Intravenous  Once 07/14/21 1540 07/14/21 1702   07/14/21 1545  azithromycin (ZITHROMAX) 500 mg in sodium chloride 0.9 % 250 mL IVPB  Status:  Discontinued        500 mg 250 mL/hr over 60 Minutes Intravenous Every 24 hours 07/14/21 1540 07/14/21 1920        MEDICATIONS: Scheduled Meds:  atorvastatin  10 mg Oral Daily   budesonide (PULMICORT) nebulizer solution  0.25 mg Nebulization BID   doxycycline  100 mg Oral Q12H   DULoxetine  60 mg Oral Daily   enoxaparin (LOVENOX) injection  80 mg Subcutaneous Q24H   erythromycin   Both Eyes Q6H   famotidine  20 mg Oral BID   fluticasone  2 spray Each Nare Daily   furosemide  80 mg Intravenous BID   guaiFENesin  600 mg Oral BID   insulin aspart  0-20 Units Subcutaneous TID WC   insulin aspart  0-5 Units Subcutaneous QHS   insulin glargine-yfgn  16 Units Subcutaneous Daily   ipratropium-albuterol  3 mL Nebulization BID   nicotine  21 mg Transdermal Daily   mouth rinse  15 mL Mouth  Rinse 4 times per day   predniSONE  40 mg Oral Q breakfast   risperidone  4 mg Oral Daily   sodium chloride flush  3 mL Intravenous Q12H   tamsulosin  0.4 mg Oral Daily   Continuous Infusions:  sodium chloride     cefTRIAXone (ROCEPHIN)  IV 2 g (07/17/21 1713)   PRN Meds:.sodium chloride, acetaminophen **OR** acetaminophen, albuterol, mouth rinse, sodium chloride flush   I have personally reviewed following labs and imaging studies  LABORATORY DATA: CBC: Recent Labs  Lab 07/14/21 1550 07/14/21 1557 07/15/21 0147 07/15/21 0316 07/16/21 0157  WBC 12.0*  --   --  13.1* 13.4*  HGB 15.6 16.7 17.0 15.5 15.0  HCT 54.1* 49.0 50.0 53.2* 51.2  MCV 92.8  --   --  91.4 88.3  PLT 268  --   --  221 232     Basic Metabolic Panel: Recent Labs  Lab 07/14/21 1550 07/14/21 1557 07/15/21 0147 07/15/21 0316  07/16/21 0157 07/17/21 0155 07/18/21 0158  NA 145   < > 143 146* 144 138 138  K 3.9   < > 3.9 3.9 3.9 4.1 4.0  CL 99  --   --  99 93* 88* 87*  CO2 33*  --   --  37* 41* 34* 38*  GLUCOSE 151*  --   --  157* 180* 232* 310*  BUN 13  --   --  11 14 20  27*  CREATININE 1.09  --   --  1.01 0.91 0.86 0.96  CALCIUM 7.8*  --   --  7.7* 8.0* 8.6* 9.0  MG  --   --   --  2.2  --   --   --    < > = values in this interval not displayed.     GFR: Estimated Creatinine Clearance: 141.2 mL/min (by C-G formula based on SCr of 0.96 mg/dL).  Liver Function Tests: Recent Labs  Lab 07/14/21 1550  AST 20  ALT 29  ALKPHOS 77  BILITOT 0.6  PROT 6.0*  ALBUMIN 2.8*    No results for input(s): "LIPASE", "AMYLASE" in the last 168 hours. No results for input(s): "AMMONIA" in the last 168 hours.  Coagulation Profile: No results for input(s): "INR", "PROTIME" in the last 168 hours.  Cardiac Enzymes: No results for input(s): "CKTOTAL", "CKMB", "CKMBINDEX", "TROPONINI" in the last 168 hours.  BNP (last 3 results) No results for input(s): "PROBNP" in the last 8760 hours.  Lipid  Profile: No results for input(s): "CHOL", "HDL", "LDLCALC", "TRIG", "CHOLHDL", "LDLDIRECT" in the last 72 hours.  Thyroid Function Tests: No results for input(s): "TSH", "T4TOTAL", "FREET4", "T3FREE", "THYROIDAB" in the last 72 hours.  Anemia Panel: No results for input(s): "VITAMINB12", "FOLATE", "FERRITIN", "TIBC", "IRON", "RETICCTPCT" in the last 72 hours.  Urine analysis:    Component Value Date/Time   COLORURINE YELLOW (A) 11/16/2020 2238   APPEARANCEUR HAZY (A) 11/16/2020 2238   LABSPEC >1.046 (H) 11/16/2020 2238   PHURINE 5.0 11/16/2020 2238   GLUCOSEU NEGATIVE 11/16/2020 2238   HGBUR NEGATIVE 11/16/2020 2238   BILIRUBINUR NEGATIVE 11/16/2020 2238   KETONESUR NEGATIVE 11/16/2020 2238   PROTEINUR NEGATIVE 11/16/2020 2238   NITRITE NEGATIVE 11/16/2020 2238   LEUKOCYTESUR NEGATIVE 11/16/2020 2238    Sepsis Labs: Lactic Acid, Venous    Component Value Date/Time   LATICACIDVEN 1.6 07/14/2021 1724    MICROBIOLOGY: Recent Results (from the past 240 hour(s))  Resp Panel by RT-PCR (Flu A&B, Covid) Anterior Nasal Swab     Status: None   Collection Time: 07/14/21  3:11 PM   Specimen: Anterior Nasal Swab  Result Value Ref Range Status   SARS Coronavirus 2 by RT PCR NEGATIVE NEGATIVE Final    Comment: (NOTE) SARS-CoV-2 target nucleic acids are NOT DETECTED.  The SARS-CoV-2 RNA is generally detectable in upper respiratory specimens during the acute phase of infection. The lowest concentration of SARS-CoV-2 viral copies this assay can detect is 138 copies/mL. A negative result does not preclude SARS-Cov-2 infection and should not be used as the sole basis for treatment or other patient management decisions. A negative result may occur with  improper specimen collection/handling, submission of specimen other than nasopharyngeal swab, presence of viral mutation(s) within the areas targeted by this assay, and inadequate number of viral copies(<138 copies/mL). A negative result  must be combined with clinical observations, patient history, and epidemiological information. The expected result is Negative.  Fact Sheet for Patients:  09/14/21  Fact Sheet  for Healthcare Providers:  SeriousBroker.it  This test is no t yet approved or cleared by the [place] and  has been authorized for detection and/or diagnosis of SARS-CoV-2 by FDA under an Emergency Use Authorization (EUA). This EUA will remain  in effect (meaning this test can be used) for the duration of the COVID-19 declaration under Section 564(b)(1) of the Act, 21 U.S.C.section 360bbb-3(b)(1), unless the authorization is terminated  or revoked sooner.       Influenza A by PCR NEGATIVE NEGATIVE Final   Influenza B by PCR NEGATIVE NEGATIVE Final    Comment: (NOTE) The Xpert Xpress SARS-CoV-2/FLU/RSV plus assay is intended as an aid in the diagnosis of influenza from Nasopharyngeal swab specimens and should not be used as a sole basis for treatment. Nasal washings and aspirates are unacceptable for Xpert Xpress SARS-CoV-2/FLU/RSV testing.  Fact Sheet for Patients: BloggerCourse.com  Fact Sheet for Healthcare Providers: SeriousBroker.it  This test is not yet approved or cleared by the [place] FDA and has been authorized for detection and/or diagnosis of SARS-CoV-2 by FDA under an Emergency Use Authorization (EUA). This EUA will remain in effect (meaning this test can be used) for the duration of the COVID-19 declaration under Section 564(b)(1) of the Act, 21 U.S.C. section 360bbb-3(b)(1), unless the authorization is terminated or revoked.  Performed at [facility], [address], [place], [place] [address]   Respiratory (~20 pathogens) panel by PCR     Status: None   Collection Time: 07/14/21  3:11 PM   Specimen: Nasopharyngeal Swab; Respiratory  Result Value Ref  Range Status   Adenovirus NOT DETECTED NOT DETECTED Final   Coronavirus 229E NOT DETECTED NOT DETECTED Final    Comment: (NOTE) The Coronavirus on the Respiratory Panel, DOES NOT test for the novel  Coronavirus (2019 nCoV)    Coronavirus HKU1 NOT DETECTED NOT DETECTED Final   Coronavirus NL63 NOT DETECTED NOT DETECTED Final   Coronavirus OC43 NOT DETECTED NOT DETECTED Final   Metapneumovirus NOT DETECTED NOT DETECTED Final   Rhinovirus / Enterovirus NOT DETECTED NOT DETECTED Final   Influenza A NOT DETECTED NOT DETECTED Final   Influenza B NOT DETECTED NOT DETECTED Final   Parainfluenza Virus 1 NOT DETECTED NOT DETECTED Final   Parainfluenza Virus 2 NOT DETECTED NOT DETECTED Final   Parainfluenza Virus 3 NOT DETECTED NOT DETECTED Final   Parainfluenza Virus 4 NOT DETECTED NOT DETECTED Final   Respiratory Syncytial Virus NOT DETECTED NOT DETECTED Final   Bordetella pertussis NOT DETECTED NOT DETECTED Final   Bordetella Parapertussis NOT DETECTED NOT DETECTED Final   Chlamydophila pneumoniae NOT DETECTED NOT DETECTED Final   Mycoplasma pneumoniae NOT DETECTED NOT DETECTED Final    Comment: Performed at [facility], [address], [place], [place] [address]  Culture, blood (routine x 2)     Status: None (Preliminary result)   Collection Time: 07/14/21  4:50 PM   Specimen: BLOOD LEFT HAND  Result Value Ref Range Status   Specimen Description BLOOD LEFT HAND  Final   Special Requests   Final    BOTTLES DRAWN AEROBIC AND ANAEROBIC Blood Culture adequate volume   Culture   Final    NO GROWTH 4 DAYS Performed at [facility], [address] [place], [place] [address]    Report Status PENDING  Incomplete  Culture, blood (routine x 2)     Status: None (Preliminary result)   Collection Time: 07/15/21  5:58 AM  Specimen: BLOOD  Result Value Ref Range Status   Specimen Description BLOOD RIGHT ANTECUBITAL  Final   Special Requests   Final    BOTTLES DRAWN AEROBIC AND  ANAEROBIC Blood Culture adequate volume   Culture   Final    NO GROWTH 3 DAYS Performed at [facility], [address] [place], [place] [address]    Report Status PENDING  Incomplete    RADIOLOGY STUDIES/RESULTS: No results found.   LOS: 4 days   Jeoffrey Massed, MD  [facility]    To contact the attending provider between 7A-7P or the covering provider during after hours 7P-7A, please log into the web site [email or web address] and access using universal [place] password for that web site. If you do not have the password, please call the hospital operator.  07/18/2021, 1:33 PM

## 2021-07-18 NOTE — Progress Notes (Signed)
Patients glucose elevated at 479. Provider notified and 20 units insulin ordered and given. Patients glucose rechecked showing 383. Provider notified again with order for another 15 units. Stated to recheck glucose in the morning unless hypoglycemic concerns.

## 2021-07-19 ENCOUNTER — Inpatient Hospital Stay (HOSPITAL_COMMUNITY): Payer: Medicaid Other

## 2021-07-19 LAB — CULTURE, BLOOD (ROUTINE X 2)
Culture: NO GROWTH
Special Requests: ADEQUATE

## 2021-07-19 LAB — BASIC METABOLIC PANEL
Anion gap: 17 — ABNORMAL HIGH (ref 5–15)
BUN: 31 mg/dL — ABNORMAL HIGH (ref 6–20)
CO2: 31 mmol/L (ref 22–32)
Calcium: 9.1 mg/dL (ref 8.9–10.3)
Chloride: 89 mmol/L — ABNORMAL LOW (ref 98–111)
Creatinine, Ser: 0.96 mg/dL (ref 0.61–1.24)
GFR, Estimated: >60 mL/min (ref >60)
Glucose, Bld: 173 mg/dL — ABNORMAL HIGH (ref 70–99)
Potassium: 3.8 mmol/L (ref 3.5–5.1)
Sodium: 137 mmol/L (ref 135–145)

## 2021-07-19 LAB — GLUCOSE, CAPILLARY
Glucose-Capillary: 192 mg/dL — ABNORMAL HIGH (ref 70–99)
Glucose-Capillary: 269 mg/dL — ABNORMAL HIGH (ref 70–99)
Glucose-Capillary: 282 mg/dL — ABNORMAL HIGH (ref 70–99)
Glucose-Capillary: 357 mg/dL — ABNORMAL HIGH (ref 70–99)
Glucose-Capillary: 324 mg/dL — ABNORMAL HIGH (ref 70–99)

## 2021-07-19 MED ORDER — FUROSEMIDE 10 MG/ML IJ SOLN
40.0000 mg | Freq: Two times a day (BID) | INTRAMUSCULAR | Status: DC
  Administered 2021-07-19 – 2021-07-23 (×8): 40 mg via INTRAVENOUS
  Filled 2021-07-19 (×8): qty 4

## 2021-07-19 MED ORDER — INSULIN ASPART 100 UNIT/ML IJ SOLN
8.0000 [IU] | Freq: Three times a day (TID) | INTRAMUSCULAR | Status: DC
  Administered 2021-07-19 – 2021-07-20 (×5): 8 [IU] via SUBCUTANEOUS

## 2021-07-19 MED ORDER — INSULIN GLARGINE-YFGN 100 UNIT/ML ~~LOC~~ SOLN
30.0000 [IU] | Freq: Every day | SUBCUTANEOUS | Status: DC
  Administered 2021-07-19 – 2021-07-20 (×2): 30 [IU] via SUBCUTANEOUS
  Filled 2021-07-19 (×2): qty 0.3

## 2021-07-19 MED ORDER — FUROSEMIDE 10 MG/ML IJ SOLN: 80.0000 mg | Freq: Two times a day (BID) | INTRAMUSCULAR | Status: DC

## 2021-07-19 MED ORDER — PREDNISONE 10 MG PO TABS
30.0000 mg | ORAL_TABLET | Freq: Every day | ORAL | Status: DC
Start: 2021-07-19 — End: 2021-07-20
  Administered 2021-07-19: 30 mg via ORAL
  Filled 2021-07-19: qty 1

## 2021-07-19 NOTE — Progress Notes (Signed)
PROGRESS NOTE        PATIENT DETAILS Name: Raymond Hood Age: 51 y.o. Sex: male Date of Birth: July 12, 1970 Admit Date: 07/14/2021 Admitting Physician Orland Mustard, MD OVA:[id number], Charlcie Cradle, MD  Brief Summary: Patient is a 51 y.o.  male with history of COPD, OSA/OHS, HFpEF, HTN, DM-2, schizoaffective/bipolar disorder, homelessness, ongoing tobacco use-presented with several days history of worsening shortness of breath (seen by PCP recently-started on steroids/cefdinir/Lasix dose escalated)-found to have acute hypoxic respiratory failure due to PNA and decompensated heart failure.  See below for further details.   Significant events: 7/5>> admit to [facility]-hypoxia-PNA/HFpEF exacerbation-started on BiPAP 7/6>> liberated off BiPAP-transition to 8 L HFNC.  Significant studies: 7/5>> CXR: Bibasilar opacities 7/5>> CXR: Right basilar atelectasis-?  Right pleural effusion.  Significant microbiology data: 7/5>> COVID/influenza PCR: Negative 7/5>> respiratory virus panel: Negative 7/5>> blood culture: No growth 7/6>> blood culture: Pending  Procedures:   Consults: None  Subjective: Some hypoglycemia overnight but no major issues.  Feels better.  Down to 2 L of oxygen.  Objective: Vitals: Blood pressure (!) 134/91, pulse 81, temperature 98.4 F (36.9 C), temperature source Axillary, resp. rate 17, height 5\' 8"  (1.727 m), weight (!) 162.7 kg, SpO2 91 %.   Exam: Gen Exam:Alert awake-not in any distress HEENT:atraumatic, normocephalic Chest: B/L clear to auscultation anteriorly CVS:S1S2 regular Abdomen:soft non tender, non distended Extremities:+edema Neurology: Non focal Skin: no rash   Pertinent Labs/Radiology:    Latest Ref Rng & Units 07/16/2021    1:57 AM 07/15/2021    3:16 AM 07/15/2021    1:47 AM  CBC  WBC 4.0 - 10.5 K/uL 13.4  13.1    Hemoglobin 13.0 - 17.0 g/dL 06.0  04.5  99.7   Hematocrit 39.0 - 52.0 % 51.2  53.2  50.0   Platelets 150 - 400  K/uL 232  221      Lab Results  Component Value Date   NA 137 07/19/2021   K 3.8 07/19/2021   CL 89 (L) 07/19/2021   CO2 31 07/19/2021    Assessment/Plan: Acute hypoxic/hypercarbic respiratory failure: Etiology felt to be multifactorial from HFpEF exacerbation-COPD exacerbation/PNA/OSA/OHS.  Continues to improve with treatment of underlying pathologies-see below.  Down to 2 L of oxygen-continue to titrate off oxygen-May need a ambulatory O2 sat to see if he requires home O2 prior to discharge.  Sepsis due to PNA: Sepsis physiology resolved-hypoxia improving-stop Rocephin/Doxy-as has completed 5 days of therapy.  Cultures continue to be negative.   HFpEF exacerbation: Volume status much better-still somewhat volume overloaded-we will decrease IV Lasix to 40 mg twice daily.    COPD exacerbation: Improved-some wheezing-continue to titrate down prednisone-remains on bronchodilators.    OHS/OSA: Claims that he has a NIV at his shelter-unclear how compliant he is to this-needs to continue NIV/BiPAP nightly and whenever sleeping.   Acute bilateral conjunctivitis: Continue erythromycin ointment.  DM-2.  (A1c 8.3 on 5/23) with uncontrolled hyperglycemia due to steroids: CBGs uncontrolled-increasing Semglee to 30 units, NovoLog to 10 units-continue SSI-continue to watch closely suspect that as steroids get tapered down further-we will require less insulin.    Recent Labs    07/18/21 2308 07/19/21 0726 07/19/21 0838  GLUCAP 383* 192* 269*     HLD: Continue statin  Schizoaffective disorder-bipolar type/anxiety/depression: Continue risperidone, Cymbalta-holding Neurontin until hypercarbia improves further.  Tobacco abuse: Continue transdermal nicotine  Debility/deconditioning: Continue  to mobilize with PT/OT.  Homelessness: Child psychotherapist evaluation prior to discharge  Morbid Obesity: Estimated body mass index is 54.54 kg/m as calculated from the following:   Height as of this  encounter:  (1.727 m).   Weight as of this encounter: 162.7 kg.   Code status:   Code Status: Full Code   DVT Prophylaxis: Prophylactic Lovenox   Family Communication: None at bedside (per patient he has no family members)   Disposition Plan: Status is: Inpatient Remains inpatient appropriate because: Improving hypoxia/hypercarbia due to CHF/PNA-not yet stable for discharge.     Planned Discharge Destination: Back to homeless shelter later this week.   Diet: Diet Order             Diet heart healthy/carb modified Room service appropriate? Yes; Fluid consistency: Thin  Diet effective now                     Antimicrobial agents: Anti-infectives (From admission, onward)    Start     Dose/Rate Route Frequency Ordered Stop   07/15/21 1545  cefTRIAXone (ROCEPHIN) 2 g in sodium chloride 0.9 % 100 mL IVPB        2 g 200 mL/hr over 30 Minutes Intravenous Every 24 hours 07/14/21 1841     07/14/21 2200  doxycycline (VIBRA-TABS) tablet 100 mg        100 mg Oral Every 12 hours 07/14/21 1920     07/14/21 1545  cefTRIAXone (ROCEPHIN) 2 g in sodium chloride 0.9 % 100 mL IVPB        2 g 200 mL/hr over 30 Minutes Intravenous  Once 07/14/21 1540 07/14/21 1702   07/14/21 1545  azithromycin (ZITHROMAX) 500 mg in sodium chloride 0.9 % 250 mL IVPB  Status:  Discontinued        500 mg 250 mL/hr over 60 Minutes Intravenous Every 24 hours 07/14/21 1540 07/14/21 1920        MEDICATIONS: Scheduled Meds:  arformoterol  15 mcg Nebulization BID   atorvastatin  10 mg Oral Daily   budesonide (PULMICORT) nebulizer solution  0.25 mg Nebulization BID   doxycycline  100 mg Oral Q12H   DULoxetine  60 mg Oral Daily   enoxaparin (LOVENOX) injection  80 mg Subcutaneous Q24H   erythromycin   Both Eyes Q6H   famotidine  20 mg Oral BID   fluticasone  2 spray Each Nare Daily   [START ON 07/20/2021] furosemide  80 mg Intravenous BID   guaiFENesin  600 mg Oral BID   insulin aspart  0-20 Units  Subcutaneous TID WC   insulin aspart  0-5 Units Subcutaneous QHS   insulin aspart  8 Units Subcutaneous TID WC   insulin glargine-yfgn  30 Units Subcutaneous Daily   nicotine  21 mg Transdermal Daily   mouth rinse  15 mL Mouth Rinse 4 times per day   predniSONE  30 mg Oral Q breakfast   revefenacin  175 mcg Nebulization Daily   risperidone  4 mg Oral Daily   sodium chloride flush  3 mL Intravenous Q12H   tamsulosin  0.4 mg Oral Daily   Continuous Infusions:  sodium chloride     cefTRIAXone (ROCEPHIN)  IV 200 mL/hr at 07/18/21 1525   PRN Meds:.sodium chloride, acetaminophen **OR** acetaminophen, albuterol, mouth rinse, sodium chloride flush   I have personally reviewed following labs and imaging studies  LABORATORY DATA: CBC: Recent Labs  Lab 07/14/21 1550 07/14/21 1557 07/15/21 0147 07/15/21 0316  07/16/21 0157  WBC 12.0*  --   --  13.1* 13.4*  HGB 15.6 16.7 17.0 15.5 15.0  HCT 54.1* 49.0 50.0 53.2* 51.2  MCV 92.8  --   --  91.4 88.3  PLT 268  --   --  221 232     Basic Metabolic Panel: Recent Labs  Lab 07/15/21 0316 07/16/21 0157 07/17/21 0155 07/18/21 0158 07/19/21 0805  NA 146* 144 138 138 137  K 3.9 3.9 4.1 4.0 3.8  CL 99 93* 88* 87* 89*  CO2 37* 41* 34* 38* 31  GLUCOSE 157* 180* 232* 310* 173*  BUN 27* 31*  CREATININE 1.01 0.91 0.86 0.96 0.96  CALCIUM 7.7* 8.0* 8.6* 9.0 9.1  MG 2.2  --   --   --   --      GFR: Estimated Creatinine Clearance: 141.2 mL/min (by C-G formula based on SCr of 0.96 mg/dL).  Liver Function Tests: Recent Labs  Lab 07/14/21 1550  AST 20  ALT 29  ALKPHOS 77  BILITOT 0.6  PROT 6.0*  ALBUMIN 2.8*    No results for input(s): "LIPASE", "AMYLASE" in the last 168 hours. No results for input(s): "AMMONIA" in the last 168 hours.  Coagulation Profile: No results for input(s): "INR", "PROTIME" in the last 168 hours.  Cardiac Enzymes: No results for input(s): "CKTOTAL", "CKMB", "CKMBINDEX", "TROPONINI" in the last  168 hours.  BNP (last 3 results) No results for input(s): "PROBNP" in the last 8760 hours.  Lipid Profile: No results for input(s): "CHOL", "HDL", "LDLCALC", "TRIG", "CHOLHDL", "LDLDIRECT" in the last 72 hours.  Thyroid Function Tests: No results for input(s): "TSH", "T4TOTAL", "FREET4", "T3FREE", "THYROIDAB" in the last 72 hours.  Anemia Panel: No results for input(s): "VITAMINB12", "FOLATE", "FERRITIN", "TIBC", "IRON", "RETICCTPCT" in the last 72 hours.  Urine analysis:    Component Value Date/Time   COLORURINE YELLOW (A) 11/16/2020 2238   APPEARANCEUR HAZY (A) 11/16/2020 2238   LABSPEC >1.046 (H) 11/16/2020 2238   PHURINE 5.0 11/16/2020 2238   GLUCOSEU NEGATIVE 11/16/2020 2238   HGBUR NEGATIVE 11/16/2020 2238   BILIRUBINUR NEGATIVE 11/16/2020 2238   KETONESUR NEGATIVE 11/16/2020 2238   PROTEINUR NEGATIVE 11/16/2020 2238   NITRITE NEGATIVE 11/16/2020 2238   LEUKOCYTESUR NEGATIVE 11/16/2020 2238    Sepsis Labs: Lactic Acid, Venous    Component Value Date/Time   LATICACIDVEN 1.6 07/14/2021 1724    MICROBIOLOGY: Recent Results (from the past 240 hour(s))  Resp Panel by RT-PCR (Flu A&B, Covid) Anterior Nasal Swab     Status: None   Collection Time: 07/14/21  3:11 PM   Specimen: Anterior Nasal Swab  Result Value Ref Range Status   SARS Coronavirus 2 by RT PCR NEGATIVE NEGATIVE Final    Comment: (NOTE) SARS-CoV-2 target nucleic acids are NOT DETECTED.  The SARS-CoV-2 RNA is generally detectable in upper respiratory specimens during the acute phase of infection. The lowest concentration of SARS-CoV-2 viral copies this assay can detect is 138 copies/mL. A negative result does not preclude SARS-Cov-2 infection and should not be used as the sole basis for treatment or other patient management decisions. A negative result may occur with  improper specimen collection/handling, submission of specimen other than nasopharyngeal swab, presence of viral mutation(s) within  the areas targeted by this assay, and inadequate number of viral copies(<138 copies/mL). A negative result must be combined with clinical observations, patient history, and epidemiological information. The expected result is Negative.  Fact Sheet for Patients:  BloggerCourse.com  Fact  Sheet for Healthcare Providers:  SeriousBroker.it  This test is no t yet approved or cleared by the [place] FDA and  has been authorized for detection and/or diagnosis of SARS-CoV-2 by FDA under an Emergency Use Authorization (EUA). This EUA will remain  in effect (meaning this test can be used) for the duration of the COVID-19 declaration under Section 564(b)(1) of the Act, 21 U.S.C.section 360bbb-3(b)(1), unless the authorization is terminated  or revoked sooner.       Influenza A by PCR NEGATIVE NEGATIVE Final   Influenza B by PCR NEGATIVE NEGATIVE Final    Comment: (NOTE) The Xpert Xpress SARS-CoV-2/FLU/RSV plus assay is intended as an aid in the diagnosis of influenza from Nasopharyngeal swab specimens and should not be used as a sole basis for treatment. Nasal washings and aspirates are unacceptable for Xpert Xpress SARS-CoV-2/FLU/RSV testing.  Fact Sheet for Patients: BloggerCourse.com  Fact Sheet for Healthcare Providers: SeriousBroker.it  This test is not yet approved or cleared by the [place] FDA and has been authorized for detection and/or diagnosis of SARS-CoV-2 by FDA under an Emergency Use Authorization (EUA). This EUA will remain in effect (meaning this test can be used) for the duration of the COVID-19 declaration under Section 564(b)(1) of the Act, 21 U.S.C. section 360bbb-3(b)(1), unless the authorization is terminated or revoked.  Performed at [facility], [address] [place], [place] [address]   Respiratory (~20 pathogens) panel by PCR     Status:  None   Collection Time: 07/14/21  3:11 PM   Specimen: Nasopharyngeal Swab; Respiratory  Result Value Ref Range Status   Adenovirus NOT DETECTED NOT DETECTED Final   Coronavirus 229E NOT DETECTED NOT DETECTED Final    Comment: (NOTE) The Coronavirus on the Respiratory Panel, DOES NOT test for the novel  Coronavirus (2019 nCoV)    Coronavirus HKU1 NOT DETECTED NOT DETECTED Final   Coronavirus NL63 NOT DETECTED NOT DETECTED Final   Coronavirus OC43 NOT DETECTED NOT DETECTED Final   Metapneumovirus NOT DETECTED NOT DETECTED Final   Rhinovirus / Enterovirus NOT DETECTED NOT DETECTED Final   Influenza A NOT DETECTED NOT DETECTED Final   Influenza B NOT DETECTED NOT DETECTED Final   Parainfluenza Virus 1 NOT DETECTED NOT DETECTED Final   Parainfluenza Virus 2 NOT DETECTED NOT DETECTED Final   Parainfluenza Virus 3 NOT DETECTED NOT DETECTED Final   Parainfluenza Virus 4 NOT DETECTED NOT DETECTED Final   Respiratory Syncytial Virus NOT DETECTED NOT DETECTED Final   Bordetella pertussis NOT DETECTED NOT DETECTED Final   Bordetella Parapertussis NOT DETECTED NOT DETECTED Final   Chlamydophila pneumoniae NOT DETECTED NOT DETECTED Final   Mycoplasma pneumoniae NOT DETECTED NOT DETECTED Final    Comment: Performed at [facility], [address], [place], [place] [address]  Culture, blood (routine x 2)     Status: None   Collection Time: 07/14/21  4:50 PM   Specimen: BLOOD LEFT HAND  Result Value Ref Range Status   Specimen Description BLOOD LEFT HAND  Final   Special Requests   Final    BOTTLES DRAWN AEROBIC AND ANAEROBIC Blood Culture adequate volume   Culture   Final    NO GROWTH 5 DAYS Performed at [facility], [address], [place], [place] [address]    Report Status 07/19/2021 FINAL  Final  Culture, blood (routine x 2)     Status: None (Preliminary result)   Collection Time: 07/15/21  5:58 AM  Specimen: BLOOD  Result Value Ref Range Status   Specimen Description  BLOOD RIGHT ANTECUBITAL  Final   Special Requests   Final    BOTTLES DRAWN AEROBIC AND ANAEROBIC Blood Culture adequate volume   Culture   Final    NO GROWTH 4 DAYS Performed at [facility], [address] [place], [place] [address]    Report Status PENDING  Incomplete    RADIOLOGY STUDIES/RESULTS: No results found.   LOS: 5 days   Jeoffrey Massed, MD  [facility]    To contact the attending provider between 7A-7P or the covering provider during after hours 7P-7A, please log into the web site [email or web address] and access using universal [place] password for that web site. If you do not have the password, please call the hospital operator.  07/19/2021, 11:38 AM

## 2021-07-19 NOTE — Progress Notes (Addendum)
Physical Therapy Treatment Patient Details Name: Raymond Hood MRN: [id number] DOB: 1970/03/02 Today's Date: 07/19/2021   History of Present Illness 51 y/o male presented to ED on 07/14/21 for SOB, cough, hypoxia. Found to have pneumonia. Admitted for acute on chronic respiratory failure with hypoxia. PMH: COPD, bipolar 1 disorder, T2DM, HTN, OSA, RA    PT Comments    Pt requiring encouragement to participate with therapy this session but demonstrated increased tolerance to activity. Requiring min guard for transfers and supervision for ambulation of 167ft with RW. O2 remained >94% on 3L during ambulation. Pt educated on importance of frequent ambulation for lung health. PT will continue to follow while inpatient to address deficits in balance and tolerance to activity.    Recommendations for follow up therapy are one component of a multi-disciplinary discharge planning process, led by the attending physician.  Recommendations may be updated based on patient status, additional functional criteria and insurance authorization.  Follow Up Recommendations  No PT follow up     Assistance Recommended at Discharge Frequent or constant Supervision/Assistance  Patient can return home with the following A little help with bathing/dressing/bathroom;Assist for transportation   Equipment Recommendations  Rollator (4 wheels)    Recommendations for Other Services       Precautions / Restrictions Precautions Precautions: Fall Restrictions Weight Bearing Restrictions: No     Mobility  Bed Mobility Overal bed mobility: Modified Independent                  Transfers Overall transfer level: Needs assistance Equipment used: Rolling walker (2 wheels) Transfers: Sit to/from Stand Sit to Stand: Min guard           General transfer comment: min guard for safety, requring increased time and rocking prior to standing    Ambulation/Gait Ambulation/Gait assistance: Supervision Gait  Distance (Feet): 100 Feet Assistive device: Rolling walker (2 wheels) Gait Pattern/deviations: Step-through pattern, Trunk flexed Gait velocity: decreased Gait velocity interpretation: 1.31 - 2.62 ft/sec, indicative of limited community ambulator   General Gait Details: slow steady gait, distance limited by pt complaints of fatigue   Stairs             Wheelchair Mobility    Modified Rankin (Stroke Patients Only)       Balance Overall balance assessment: Mild deficits observed, not formally tested                                          Cognition Arousal/Alertness: Awake/alert Behavior During Therapy: Flat affect Overall Cognitive Status: No family/caregiver present to determine baseline cognitive functioning                                 General Comments: hx of schizoaffective/bipolar disorder. flat affect but follows directions consistently. agitated by therapy attempts to mobilize        Exercises      General Comments General comments (skin integrity, edema, etc.): O2 >94% on 3L during ambulation      Pertinent Vitals/Pain Pain Assessment Pain Assessment: No/denies pain    Home Living                          Prior Function            PT Goals (current goals can now  be found in the care plan section) Acute Rehab PT Goals Patient Stated Goal: to get better PT Goal Formulation: With patient Time For Goal Achievement: 07/30/21 Potential to Achieve Goals: Good Progress towards PT goals: Progressing toward goals    Frequency    Min 3X/week      PT Plan Current plan remains appropriate    Co-evaluation              AM-PAC PT "6 Clicks" Mobility   Outcome Measure  Help needed turning from your back to your side while in a flat bed without using bedrails?: None Help needed moving from lying on your back to sitting on the side of a flat bed without using bedrails?: None Help needed moving to  and from a bed to a chair (including a wheelchair)?: A Little Help needed standing up from a chair using your arms (e.g., wheelchair or bedside chair)?: A Little Help needed to walk in hospital room?: A Little Help needed climbing 3-5 steps with a railing? : A Lot 6 Click Score: 19    End of Session Equipment Utilized During Treatment: Oxygen Activity Tolerance: Patient tolerated treatment well;Patient limited by fatigue Patient left: in bed;with call bell/phone within reach Nurse Communication: Mobility status PT Visit Diagnosis: Other abnormalities of gait and mobility (R26.89);Unsteadiness on feet (R26.81);Muscle weakness (generalized) (M62.81)     Time: 2130-8657 PT Time Calculation (min) (ACUTE ONLY): 15 min  Charges:  $Therapeutic Activity: 8-22 mins                     Davina Poke, SPT Acute Rehabilitation Services  Office: [phone]    Davina Poke 07/19/2021, 5:08 PM

## 2021-07-19 NOTE — Progress Notes (Signed)
Occupational Therapy Treatment Patient Details Name: Raymond Hood MRN: [id number] DOB: [birth year] Today's Date: 07/19/2021   History of present illness 51 y/o male presented to ED on 07/14/21 for SOB, cough, hypoxia. Found to have pneumonia. Admitted for acute on chronic respiratory failure with hypoxia. PMH: COPD, bipolar 1 disorder, T2DM, HTN, OSA, RA   OT comments  Session focused on AE education for LB ADLs to ease burden of task and allow improved breathing techniques during ADLs. Pt able to return demo use of reacher/sock aid with Min A and cues for technique. Will plan to issue this AE for pt to use at DC. Hoped to progress mobility in room today though pt declined, citing it was too early to "run a marathon". Continue to address ADL/mobility independence with weaning O2 to safely DC to shelter.   SpO2 >91% on 3 L O2   Recommendations for follow up therapy are one component of a multi-disciplinary discharge planning process, led by the attending physician.  Recommendations may be updated based on patient status, additional functional criteria and insurance authorization.    Follow Up Recommendations  No OT follow up (anticipate no OT follow up d/t no insurance for SNF rehab or HHOT)    Assistance Recommended at Discharge Set up Supervision/Assistance  Patient can return home with the following  A little help with bathing/dressing/bathroom;Assistance with cooking/housework;Assist for transportation;Help with stairs or ramp for entrance   Equipment Recommendations  Other (comment) (TBD pending progress; would benefit from accesible shower chair at shelter)    Recommendations for Other Services      Precautions / Restrictions Precautions Precautions: Fall Precaution Comments: watch O2 Restrictions Weight Bearing Restrictions: No       Mobility Bed Mobility Overal bed mobility: Needs Assistance Bed Mobility: Supine to Sit     Supine to sit: Supervision, HOB elevated      General bed mobility comments: increased time/effort.    Transfers Overall transfer level: Needs assistance Equipment used: 1 person hand held assist Transfers: Sit to/from Stand, Bed to chair/wheelchair/BSC Sit to Stand: Min guard     Step pivot transfers: Min guard     General transfer comment: slower, wide BOS but able to turn with very light handheld assist, declined need for RW use     Balance Overall balance assessment: Needs assistance Sitting-balance support: No upper extremity supported, Feet supported Sitting balance-Leahy Scale: Good     Standing balance support: Single extremity supported, During functional activity Standing balance-Leahy Scale: Fair                             ADL either performed or assessed with clinical judgement   ADL Overall ADL's : Needs assistance/impaired Eating/Feeding: Independent Eating/Feeding Details (indicate cue type and reason): opening containers to prepare coffee w/o difficulty                 Lower Body Dressing: Minimal assistance;Sit to/from stand;Sitting/lateral leans;With adaptive equipment Lower Body Dressing Details (indicate cue type and reason): educated on use of reacher and sock aide d/t pt difficulty reaching feet. pt reports use of sock aide takes the same amount of time but requires less effort and allows easier breathing. Will issue bariatric sock aide and reacher to ease LB ADL             Functional mobility during ADLs: Min guard General ADL Comments: receptive to LB ADL education though declined mobility attempts this AM as "its  too early to run a marathon" and perseverating on breakfast/coffee    Extremity/Trunk Assessment Upper Extremity Assessment Upper Extremity Assessment: Overall WFL for tasks assessed   Lower Extremity Assessment Lower Extremity Assessment: Defer to PT evaluation        Vision   Vision Assessment?: No apparent visual deficits   Perception     Praxis       Cognition Arousal/Alertness: Awake/alert Behavior During Therapy: Flat affect Overall Cognitive Status: No family/caregiver present to determine baseline cognitive functioning                                 General Comments: hx of schizoaffective/bipolar disoder. flat affect but pleasant and follows directions. not forthcoming with information at times, decreased awareness of deficits/hygiene needs. midly agitated by early morning therapy attempts        Exercises      Shoulder Instructions       General Comments SpO2 > 90% on 3 L O2 (appears to be on HFNC line)    Pertinent Vitals/ Pain       Pain Assessment Pain Assessment: No/denies pain  Home Living                                          Prior Functioning/Environment              Frequency  Min 2X/week        Progress Toward Goals  OT Goals(current goals can now be found in the care plan section)  Progress towards OT goals: Progressing toward goals  Acute Rehab OT Goals Patient Stated Goal: get some coffee OT Goal Formulation: With patient Time For Goal Achievement: 07/31/21 Potential to Achieve Goals: Good ADL Goals Pt Will Perform Lower Body Dressing: with modified independence;with adaptive equipment;sit to/from stand Pt Will Transfer to Toilet: with modified independence;ambulating Pt/caregiver will Perform Home Exercise Program: Increased strength;Both right and left upper extremity;With theraband;Independently;With written HEP provided Additional ADL Goal #1: Pt to increase standing tolerance > 10 min during daily tasks without need for rest break  Plan Discharge plan remains appropriate    Co-evaluation                 AM-PAC OT "6 Clicks" Daily Activity     Outcome Measure   Help from another person eating meals?: None Help from another person taking care of personal grooming?: A Little Help from another person toileting, which includes using  toliet, bedpan, or urinal?: A Lot Help from another person bathing (including washing, rinsing, drying)?: A Lot Help from another person to put on and taking off regular upper body clothing?: A Little Help from another person to put on and taking off regular lower body clothing?: A Little 6 Click Score: 17    End of Session Equipment Utilized During Treatment: Oxygen  OT Visit Diagnosis: Unsteadiness on feet (R26.81)   Activity Tolerance Patient tolerated treatment well   Patient Left in chair;with call bell/phone within reach;with chair alarm set   Nurse Communication          Time: [phone] OT Time Calculation (min): 24 min  Charges: OT General Charges $OT Visit: 1 Visit OT Treatments $Self Care/Home Management : 23-37 mins  Bradd Canary, OTR/L Acute Rehab Services Office: [phone]   Lorre Munroe 07/19/2021, 7:58 AM

## 2021-07-19 NOTE — Progress Notes (Signed)
Inpatient Diabetes Program Recommendations  AACE/ADA: New Consensus Statement on Inpatient Glycemic Control (2015)  Target Ranges:  Prepandial:   less than 140 mg/dL      Peak postprandial:   less than 180 mg/dL (1-2 hours)      Critically ill patients:  140 - 180 mg/dL   Lab Results  Component Value Date   GLUCAP 282 (H) 07/19/2021   HGBA1C 8.3 (A) 06/01/2021    Review of Glycemic Control  Diabetes history: DM2 Outpatient Diabetes medications: Farxiga 20 QD, metformin 1000 mg BID Current orders for Inpatient glycemic control: Semglee 30 QD, Novolog 0-20 units TID with meals and 0-5 HS + 8 units TID Prednisone 30 mg QD  HgbA1C - 8.3%  Inpatient Diabetes Program Recommendations:    Spoke with pt at bedside regarding his diabetes and HgbA1C of 8.3% (average blood sugar of 192 mg/dL). Pt states he dtakes his DM meds at home most of the time. Did not seem to be feeling well and he was short on conversation about his blood sugar control.   Will check back with pt prior to discharge.   Thank you. Ailene Ards, RD, LDN, CDE Inpatient Diabetes Coordinator [phone]

## 2021-07-19 NOTE — TOC Progression Note (Signed)
Transition of Care [facility]) - Progression Note    Patient Details  Name: Raymond Hood MRN: [id number] Date of Birth: 1970/04/11  [facility] CM/SW Contact  Lorri Frederick, LCSW Phone Number: 07/19/2021, 3:28 PM  Clinical Narrative:   CSW received call from [facility], [phone], [phone].  He asked for update on potential DC date.  Pt does still have a bed there, they can accomodate his being on home O2.  Pt has a cpap there but not an NIV.  He asked for update when we have better idea on DC date.     Expected Discharge Plan: Home w Home Health Services Barriers to Discharge: Continued Medical Work up  Expected Discharge Plan and Services Expected Discharge Plan: Home w Home Health Services   Discharge Planning Services: CM Consult   Living arrangements for the past 2 months: Homeless Shelter (since Nov. 2022)                                       Social Determinants of Health (SDOH) Interventions    Readmission Risk Interventions     No data to display

## 2021-07-19 NOTE — Plan of Care (Signed)
  Problem: Education: Goal: Ability to describe self-care measures that may prevent or decrease complications (Diabetes Survival Skills Education) will improve Outcome: Progressing   Problem: Coping: Goal: Ability to adjust to condition or change in health will improve Outcome: Progressing   Problem: Fluid Volume: Goal: Ability to maintain a balanced intake and output will improve Outcome: Progressing   Problem: Health Behavior/Discharge Planning: Goal: Ability to identify and utilize available resources and services will improve Outcome: Progressing Goal: Ability to manage health-related needs will improve Outcome: Progressing   Problem: Metabolic: Goal: Ability to maintain appropriate glucose levels will improve Outcome: Progressing   Problem: Nutritional: Goal: Maintenance of adequate nutrition will improve Outcome: Progressing   Problem: Skin Integrity: Goal: Risk for impaired skin integrity will decrease Outcome: Progressing   Problem: Tissue Perfusion: Goal: Adequacy of tissue perfusion will improve Outcome: Progressing

## 2021-07-20 ENCOUNTER — Inpatient Hospital Stay (HOSPITAL_COMMUNITY): Payer: Medicaid Other

## 2021-07-20 DIAGNOSIS — M7989 Other specified soft tissue disorders: Secondary | ICD-10-CM

## 2021-07-20 LAB — BASIC METABOLIC PANEL
Anion gap: 14 (ref 5–15)
BUN: 34 mg/dL — ABNORMAL HIGH (ref 6–20)
CO2: 32 mmol/L (ref 22–32)
Calcium: 8.7 mg/dL — ABNORMAL LOW (ref 8.9–10.3)
Chloride: 91 mmol/L — ABNORMAL LOW (ref 98–111)
Creatinine, Ser: 1.02 mg/dL (ref 0.61–1.24)
GFR, Estimated: >60 mL/min (ref >60)
Glucose, Bld: 202 mg/dL — ABNORMAL HIGH (ref 70–99)
Potassium: 3.7 mmol/L (ref 3.5–5.1)
Sodium: 137 mmol/L (ref 135–145)

## 2021-07-20 LAB — GLUCOSE, CAPILLARY
Glucose-Capillary: 226 mg/dL — ABNORMAL HIGH (ref 70–99)
Glucose-Capillary: 434 mg/dL — ABNORMAL HIGH (ref 70–99)
Glucose-Capillary: 448 mg/dL — ABNORMAL HIGH (ref 70–99)
Glucose-Capillary: 329 mg/dL — ABNORMAL HIGH (ref 70–99)
Glucose-Capillary: 224 mg/dL — ABNORMAL HIGH (ref 70–99)

## 2021-07-20 LAB — CULTURE, BLOOD (ROUTINE X 2)
Culture: NO GROWTH
Special Requests: ADEQUATE

## 2021-07-20 MED ORDER — HYDROCORTISONE ACETATE 25 MG RE SUPP
25.0000 mg | Freq: Two times a day (BID) | RECTAL | Status: AC
  Filled 2021-07-20 (×6): qty 1

## 2021-07-20 MED ORDER — INSULIN ASPART 100 UNIT/ML IJ SOLN
12.0000 [IU] | Freq: Three times a day (TID) | INTRAMUSCULAR | Status: DC
Start: 2021-07-20 — End: 2021-07-26
  Administered 2021-07-20 – 2021-07-26 (×13): 12 [IU] via SUBCUTANEOUS

## 2021-07-20 MED ORDER — LINAGLIPTIN 5 MG PO TABS
5.0000 mg | ORAL_TABLET | Freq: Every day | ORAL | Status: DC
  Administered 2021-07-20 – 2021-07-26 (×7): 5 mg via ORAL
  Filled 2021-07-20 (×7): qty 1

## 2021-07-20 MED ORDER — DAPAGLIFLOZIN PROPANEDIOL 10 MG PO TABS
10.0000 mg | ORAL_TABLET | Freq: Every day | ORAL | Status: DC
  Administered 2021-07-20 – 2021-07-25 (×6): 10 mg via ORAL
  Filled 2021-07-20 (×8): qty 1

## 2021-07-20 MED ORDER — INSULIN ASPART 100 UNIT/ML IJ SOLN
25.0000 [IU] | Freq: Once | INTRAMUSCULAR | Status: AC
Start: 2021-07-20 — End: 2021-07-20
  Administered 2021-07-20: 25 [IU] via SUBCUTANEOUS

## 2021-07-20 MED ORDER — INSULIN GLARGINE-YFGN 100 UNIT/ML ~~LOC~~ SOLN
35.0000 [IU] | Freq: Every day | SUBCUTANEOUS | Status: DC
  Administered 2021-07-21 – 2021-07-26 (×6): 35 [IU] via SUBCUTANEOUS
  Filled 2021-07-20 (×6): qty 0.35

## 2021-07-20 MED ORDER — POLYETHYLENE GLYCOL 3350 17 G PO PACK
17.0000 g | PACK | Freq: Every day | ORAL | Status: DC
  Administered 2021-07-20 – 2021-07-24 (×4): 17 g via ORAL
  Filled 2021-07-20 (×7): qty 1

## 2021-07-20 MED ORDER — PREDNISONE 20 MG PO TABS
20.0000 mg | ORAL_TABLET | Freq: Every day | ORAL | Status: DC
  Administered 2021-07-20: 20 mg via ORAL
  Filled 2021-07-20: qty 1

## 2021-07-20 NOTE — Progress Notes (Signed)
Patient O2 saturation at rest on room air consistently  remains between 85-87 percent.

## 2021-07-20 NOTE — Progress Notes (Signed)
PROGRESS NOTE        PATIENT DETAILS Name: Raymond Hood Age: 51 y.o. Sex: male Date of Birth: 11-27-1970 Admit Date: 07/14/2021 Admitting Physician Orma Flaming, MD [id number], Burnett Harry, MD  Brief Summary: Patient is a 51 y.o.  male with history of COPD, OSA/OHS, HFpEF, HTN, DM-2, schizoaffective/bipolar disorder, homelessness, ongoing tobacco use-presented with several days history of worsening shortness of breath (seen by PCP recently-started on steroids/cefdinir/Lasix dose escalated)-found to have acute hypoxic respiratory failure due to PNA and decompensated heart failure.  See below for further details.   Significant events: 7/5>> admit to [facility]-hypoxia-PNA/HFpEF exacerbation-started on BiPAP 7/6>> liberated off BiPAP-transition to 8 L HFNC.  Significant studies: 7/5>> CXR: Bibasilar opacities 7/5>> CXR: Right basilar atelectasis-?  Right pleural effusion.  Significant microbiology data: 7/5>> COVID/influenza PCR: Negative 7/5>> respiratory virus panel: Negative 7/5>> blood culture: No growth 7/6>> blood culture: Pending  Procedures:   Consults: None  Subjective: Sugars continue to be an issue but no major issues overnight.  Objective: Vitals: Blood pressure (!) 138/98, pulse 87, temperature 97.6 F (36.4 C), temperature source Oral, resp. rate 18, height 5\' 8"  (1.727 m), weight (!) 157.4 kg, SpO2 97 %.   Exam: Gen Exam:Alert awake-not in any distress HEENT:atraumatic, normocephalic Chest: B/L clear to auscultation anteriorly CVS:S1S2 regular Abdomen:soft non tender, non distended Extremities:+ edema Neurology: Non focal Skin: no rash   Pertinent Labs/Radiology:    Latest Ref Rng & Units 07/16/2021    1:57 AM 07/15/2021    3:16 AM 07/15/2021    1:47 AM  CBC  WBC 4.0 - 10.5 K/uL 13.4  13.1    Hemoglobin 13.0 - 17.0 g/dL 15.0  15.5  17.0   Hematocrit 39.0 - 52.0 % 51.2  53.2  50.0   Platelets 150 - 400 K/uL 232  221      Lab  Results  Component Value Date   NA 137 07/20/2021   K 3.7 07/20/2021   CL 91 (L) 07/20/2021   CO2 32 07/20/2021    Assessment/Plan: Acute hypoxic/hypercarbic respiratory failure: Etiology felt to be multifactorial from HFpEF exacerbation-COPD exacerbation/PNA/OSA/OHS.  Hypoxia better-stable on 2 L continues to improve with treatment of underlying pathologies-see below.  Ambulatory O2 saturation screen ordered-suspect will require home O2 on discharge.   Sepsis due to PNA: Sepsis physiology resolved-hypoxia improving-has completed antimicrobial therapy.  Cultures negative so far.    HFpEF exacerbation: Volume status better-but still overloaded-continue IV Lasix for another day or so.  Follow renal function/volume status closely.  COPD exacerbation: Improved-only a few scattered rhonchi-stop steroids-continue bronchodilators.     OHS/OSA: Claims that he has a NIV at his shelter-unclear how compliant he is to this-needs to continue NIV/BiPAP nightly and whenever sleeping.   Acute bilateral conjunctivitis: Continue erythromycin ointment.  DM-2.  (A1c 8.3 on 5/23) with uncontrolled hyperglycemia due to steroids: CBGs uncontrolled-continue Semglee/NovoLog-steroids being stopped today-suspect that as steroids get tapered down-we will require less amount of insulin.  He is homeless-lives in a shelter-has significant psych issues-do not think he is a good candidate for outpatient SQ insulin treatment-unclear if he will be able to monitor closely for hypoglycemia.  Recent Labs    07/19/21 2024 07/20/21 0806 07/20/21 1118  GLUCAP 324* 226* 434*     HLD: Continue statin  Schizoaffective disorder-bipolar type/anxiety/depression: Continue risperidone, Cymbalta-holding Neurontin until hypercarbia improves further.  Tobacco abuse: Continue  transdermal nicotine  Debility/deconditioning: Continue to mobilize with PT/OT.  Homelessness: Child psychotherapist evaluation prior to discharge  Morbid  Obesity: Estimated body mass index is 52.76 kg/m as calculated from the following:   Height as of this encounter: 5\' 8"  (1.727 m).   Weight as of this encounter: 157.4 kg.   Code status:   Code Status: Full Code   DVT Prophylaxis: Prophylactic Lovenox   Family Communication: None at bedside (per patient he has no family members)   Disposition Plan: Status is: Inpatient Remains inpatient appropriate because: Improving hypoxia/hypercarbia due to CHF/PNA-not yet stable for discharge.     Planned Discharge Destination: Back to homeless shelter later this week-possibly on Friday   Diet: Diet Order             Diet heart healthy/carb modified Room service appropriate? Yes; Fluid consistency: Thin  Diet effective now                     Antimicrobial agents: Anti-infectives (From admission, onward)    Start     Dose/Rate Route Frequency Ordered Stop   07/15/21 1545  cefTRIAXone (ROCEPHIN) 2 g in sodium chloride 0.9 % 100 mL IVPB  Status:  Discontinued        2 g 200 mL/hr over 30 Minutes Intravenous Every 24 hours 07/14/21 1841 07/19/21 1141   07/14/21 2200  doxycycline (VIBRA-TABS) tablet 100 mg  Status:  Discontinued        100 mg Oral Every 12 hours 07/14/21 1920 07/19/21 1141   07/14/21 1545  cefTRIAXone (ROCEPHIN) 2 g in sodium chloride 0.9 % 100 mL IVPB        2 g 200 mL/hr over 30 Minutes Intravenous  Once 07/14/21 1540 07/14/21 1702   07/14/21 1545  azithromycin (ZITHROMAX) 500 mg in sodium chloride 0.9 % 250 mL IVPB  Status:  Discontinued        500 mg 250 mL/hr over 60 Minutes Intravenous Every 24 hours 07/14/21 1540 07/14/21 1920        MEDICATIONS: Scheduled Meds:  arformoterol  15 mcg Nebulization BID   atorvastatin  10 mg Oral Daily   budesonide (PULMICORT) nebulizer solution  0.25 mg Nebulization BID   dapagliflozin propanediol  10 mg Oral Daily   DULoxetine  60 mg Oral Daily   enoxaparin (LOVENOX) injection  80 mg Subcutaneous Q24H   famotidine   20 mg Oral BID   fluticasone  2 spray Each Nare Daily   furosemide  40 mg Intravenous BID   guaiFENesin  600 mg Oral BID   hydrocortisone  25 mg Rectal BID   insulin aspart  0-20 Units Subcutaneous TID WC   insulin aspart  0-5 Units Subcutaneous QHS   insulin aspart  8 Units Subcutaneous TID WC   insulin glargine-yfgn  30 Units Subcutaneous Daily   nicotine  21 mg Transdermal Daily   mouth rinse  15 mL Mouth Rinse 4 times per day   polyethylene glycol  17 g Oral Daily   predniSONE  20 mg Oral Q breakfast   revefenacin  175 mcg Nebulization Daily   risperidone  4 mg Oral Daily   sodium chloride flush  3 mL Intravenous Q12H   tamsulosin  0.4 mg Oral Daily   Continuous Infusions:  sodium chloride     PRN Meds:.sodium chloride, acetaminophen **OR** acetaminophen, albuterol, mouth rinse, sodium chloride flush   I have personally reviewed following labs and imaging studies  LABORATORY DATA: CBC: Recent  Labs  Lab 07/14/21 1550 07/14/21 1557 07/15/21 0147 07/15/21 0316 07/16/21 0157  WBC 12.0*  --   --  13.1* 13.4*  HGB 15.6 16.7 17.0 15.5 15.0  HCT 54.1* 49.0 50.0 53.2* 51.2  MCV 92.8  --   --  91.4 88.3  PLT 268  --   --  221 232     Basic Metabolic Panel: Recent Labs  Lab 07/15/21 0316 07/16/21 0157 07/17/21 0155 07/18/21 0158 07/19/21 0805 07/20/21 0230  NA 146* 144 138 138 137 137  K 3.9 3.9 4.1 4.0 3.8 3.7  CL 99 93* 88* 87* 89* 91*  CO2 37* 41* 34* 38* 31 32  GLUCOSE 157* 180* 232* 310* 173* 202*  BUN 11 14 20  27* 31* 34*  CREATININE 1.01 0.91 0.86 0.96 0.96 1.02  CALCIUM 7.7* 8.0* 8.6* 9.0 9.1 8.7*  MG 2.2  --   --   --   --   --      GFR: Estimated Creatinine Clearance: 130.3 mL/min (by C-G formula based on SCr of 1.02 mg/dL).  Liver Function Tests: Recent Labs  Lab 07/14/21 1550  AST 20  ALT 29  ALKPHOS 77  BILITOT 0.6  PROT 6.0*  ALBUMIN 2.8*    No results for input(s): "LIPASE", "AMYLASE" in the last 168 hours. No results for  input(s): "AMMONIA" in the last 168 hours.  Coagulation Profile: No results for input(s): "INR", "PROTIME" in the last 168 hours.  Cardiac Enzymes: No results for input(s): "CKTOTAL", "CKMB", "CKMBINDEX", "TROPONINI" in the last 168 hours.  BNP (last 3 results) No results for input(s): "PROBNP" in the last 8760 hours.  Lipid Profile: No results for input(s): "CHOL", "HDL", "LDLCALC", "TRIG", "CHOLHDL", "LDLDIRECT" in the last 72 hours.  Thyroid Function Tests: No results for input(s): "TSH", "T4TOTAL", "FREET4", "T3FREE", "THYROIDAB" in the last 72 hours.  Anemia Panel: No results for input(s): "VITAMINB12", "FOLATE", "FERRITIN", "TIBC", "IRON", "RETICCTPCT" in the last 72 hours.  Urine analysis:    Component Value Date/Time   COLORURINE YELLOW (A) 11/16/2020 2238   APPEARANCEUR HAZY (A) 11/16/2020 2238   LABSPEC >1.046 (H) 11/16/2020 2238   PHURINE 5.0 11/16/2020 2238   GLUCOSEU NEGATIVE 11/16/2020 2238   HGBUR NEGATIVE 11/16/2020 2238   BILIRUBINUR NEGATIVE 11/16/2020 2238   KETONESUR NEGATIVE 11/16/2020 2238   PROTEINUR NEGATIVE 11/16/2020 2238   NITRITE NEGATIVE 11/16/2020 2238   LEUKOCYTESUR NEGATIVE 11/16/2020 2238    Sepsis Labs: Lactic Acid, Venous    Component Value Date/Time   LATICACIDVEN 1.6 07/14/2021 1724    MICROBIOLOGY: Recent Results (from the past 240 hour(s))  Resp Panel by RT-PCR (Flu A&B, Covid) Anterior Nasal Swab     Status: None   Collection Time: 07/14/21  3:11 PM   Specimen: Anterior Nasal Swab  Result Value Ref Range Status   SARS Coronavirus 2 by RT PCR NEGATIVE NEGATIVE Final    Comment: (NOTE) SARS-CoV-2 target nucleic acids are NOT DETECTED.  The SARS-CoV-2 RNA is generally detectable in upper respiratory specimens during the acute phase of infection. The lowest concentration of SARS-CoV-2 viral copies this assay can detect is 138 copies/mL. A negative result does not preclude SARS-Cov-2 infection and should not be used as the  sole basis for treatment or other patient management decisions. A negative result may occur with  improper specimen collection/handling, submission of specimen other than nasopharyngeal swab, presence of viral mutation(s) within the areas targeted by this assay, and inadequate number of viral copies(<138 copies/mL). A negative result must  be combined with clinical observations, patient history, and epidemiological information. The expected result is Negative.  Fact Sheet for Patients:  BloggerCourse.com  Fact Sheet for Healthcare Providers:  SeriousBroker.it  This test is no t yet approved or cleared by the [place] FDA and  has been authorized for detection and/or diagnosis of SARS-CoV-2 by FDA under an Emergency Use Authorization (EUA). This EUA will remain  in effect (meaning this test can be used) for the duration of the COVID-19 declaration under Section 564(b)(1) of the Act, 21 U.S.C.section 360bbb-3(b)(1), unless the authorization is terminated  or revoked sooner.       Influenza A by PCR NEGATIVE NEGATIVE Final   Influenza B by PCR NEGATIVE NEGATIVE Final    Comment: (NOTE) The Xpert Xpress SARS-CoV-2/FLU/RSV plus assay is intended as an aid in the diagnosis of influenza from Nasopharyngeal swab specimens and should not be used as a sole basis for treatment. Nasal washings and aspirates are unacceptable for Xpert Xpress SARS-CoV-2/FLU/RSV testing.  Fact Sheet for Patients: BloggerCourse.com  Fact Sheet for Healthcare Providers: SeriousBroker.it  This test is not yet approved or cleared by the [place] FDA and has been authorized for detection and/or diagnosis of SARS-CoV-2 by FDA under an Emergency Use Authorization (EUA). This EUA will remain in effect (meaning this test can be used) for the duration of the COVID-19 declaration under Section 564(b)(1) of the  Act, 21 U.S.C. section 360bbb-3(b)(1), unless the authorization is terminated or revoked.  Performed at [facility], [address], [place], [place] [address]   Respiratory (~20 pathogens) panel by PCR     Status: None   Collection Time: 07/14/21  3:11 PM   Specimen: Nasopharyngeal Swab; Respiratory  Result Value Ref Range Status   Adenovirus NOT DETECTED NOT DETECTED Final   Coronavirus 229E NOT DETECTED NOT DETECTED Final    Comment: (NOTE) The Coronavirus on the Respiratory Panel, DOES NOT test for the novel  Coronavirus (2019 nCoV)    Coronavirus HKU1 NOT DETECTED NOT DETECTED Final   Coronavirus NL63 NOT DETECTED NOT DETECTED Final   Coronavirus OC43 NOT DETECTED NOT DETECTED Final   Metapneumovirus NOT DETECTED NOT DETECTED Final   Rhinovirus / Enterovirus NOT DETECTED NOT DETECTED Final   Influenza A NOT DETECTED NOT DETECTED Final   Influenza B NOT DETECTED NOT DETECTED Final   Parainfluenza Virus 1 NOT DETECTED NOT DETECTED Final   Parainfluenza Virus 2 NOT DETECTED NOT DETECTED Final   Parainfluenza Virus 3 NOT DETECTED NOT DETECTED Final   Parainfluenza Virus 4 NOT DETECTED NOT DETECTED Final   Respiratory Syncytial Virus NOT DETECTED NOT DETECTED Final   Bordetella pertussis NOT DETECTED NOT DETECTED Final   Bordetella Parapertussis NOT DETECTED NOT DETECTED Final   Chlamydophila pneumoniae NOT DETECTED NOT DETECTED Final   Mycoplasma pneumoniae NOT DETECTED NOT DETECTED Final    Comment: Performed at [facility], [address] [place], [place] [address]  Culture, blood (routine x 2)     Status: None   Collection Time: 07/14/21  4:50 PM   Specimen: BLOOD LEFT HAND  Result Value Ref Range Status   Specimen Description BLOOD LEFT HAND  Final   Special Requests   Final    BOTTLES DRAWN AEROBIC AND ANAEROBIC Blood Culture adequate volume   Culture   Final    NO GROWTH 5 DAYS Performed at [facility], [address] [place], [place]  [address]    Report Status 07/19/2021 FINAL  Final  Culture, blood (routine x 2)     Status: None   Collection Time: 07/15/21  5:58 AM   Specimen: BLOOD  Result Value Ref Range Status   Specimen Description BLOOD RIGHT ANTECUBITAL  Final   Special Requests   Final    BOTTLES DRAWN AEROBIC AND ANAEROBIC Blood Culture adequate volume   Culture   Final    NO GROWTH 5 DAYS Performed at [facility], [address] [place] [address], [place], [place] [address]    Report Status 07/20/2021 FINAL  Final    RADIOLOGY STUDIES/RESULTS: No results found.   LOS: 6 days   Oren Binet, MD  [facility]    To contact the attending provider between 7A-7P or the covering provider during after hours 7P-7A, please log into the web site [email or web address] and access using universal [place] password for that web site. If you do not have the password, please call the hospital operator.  07/20/2021, 12:12 PM

## 2021-07-20 NOTE — Plan of Care (Signed)
  Problem: Coping: Goal: Ability to adjust to condition or change in health will improve Outcome: Progressing   Problem: Fluid Volume: Goal: Ability to maintain a balanced intake and output will improve Outcome: Progressing   Problem: Health Behavior/Discharge Planning: Goal: Ability to manage health-related needs will improve Outcome: Progressing   Problem: Metabolic: Goal: Ability to maintain appropriate glucose levels will improve Outcome: Progressing   

## 2021-07-21 LAB — GLUCOSE, CAPILLARY
Glucose-Capillary: 218 mg/dL — ABNORMAL HIGH (ref 70–99)
Glucose-Capillary: 275 mg/dL — ABNORMAL HIGH (ref 70–99)
Glucose-Capillary: 161 mg/dL — ABNORMAL HIGH (ref 70–99)
Glucose-Capillary: 207 mg/dL — ABNORMAL HIGH (ref 70–99)

## 2021-07-21 LAB — BASIC METABOLIC PANEL
Anion gap: 11 (ref 5–15)
BUN: 31 mg/dL — ABNORMAL HIGH (ref 6–20)
CO2: 33 mmol/L — ABNORMAL HIGH (ref 22–32)
Calcium: 8.7 mg/dL — ABNORMAL LOW (ref 8.9–10.3)
Chloride: 94 mmol/L — ABNORMAL LOW (ref 98–111)
Creatinine, Ser: 0.91 mg/dL (ref 0.61–1.24)
GFR, Estimated: >60 mL/min (ref >60)
Glucose, Bld: 165 mg/dL — ABNORMAL HIGH (ref 70–99)
Potassium: 3.5 mmol/L (ref 3.5–5.1)
Sodium: 138 mmol/L (ref 135–145)

## 2021-07-21 MED ORDER — METFORMIN HCL 500 MG PO TABS
1000.0000 mg | ORAL_TABLET | Freq: Two times a day (BID) | ORAL | Status: DC
  Administered 2021-07-21 – 2021-07-26 (×11): 1000 mg via ORAL
  Filled 2021-07-21 (×11): qty 2

## 2021-07-21 NOTE — Progress Notes (Signed)
Mobility Specialist Progress Note   07/21/21 1656  Mobility  Activity Ambulated with assistance in hallway  Level of Assistance Minimal assist, patient does 75% or more  Assistive Device Four wheel walker  Distance Ambulated (ft) 156 ft  Activity Response Tolerated well  $Mobility charge 1 Mobility   Pre Mobility: 91 HR, 95% SpO2 on 1LO2 During Mobility: 113 HR, 100% SpO2 on 1LO2 Post Mobility: 96 HR, 97% SpO2 on 1LO2  Patient received in bed c/o LBP (9/10), after mod encouragement pt agreeable to participate in mobility. Required min A to get pt's trunk upright on EOB but supervision for remainder of session. Ambulated in hallway w/o fault a steady gait and no breaks. Returned to room without complaint or incident. Was left back supine with all needs met and call bell in reach.   Holland Falling Mobility Specialist [facility] #:  [phone] Acute Rehab Office:  [phone]

## 2021-07-21 NOTE — Progress Notes (Addendum)
PROGRESS NOTE        PATIENT DETAILS Name: Raymond Hood Age: 51 y.o. Sex: male Date of Birth: 1970-12-29 Admit Date: 07/14/2021 Admitting Physician Orland Mustard, MD ZOX:[id number], Charlcie Cradle, MD  Brief Summary: Patient is a 51 y.o.  male with history of COPD, OSA/OHS, HFpEF, HTN, DM-2, schizoaffective/bipolar disorder, homelessness, ongoing tobacco use-presented with several days history of worsening shortness of breath (seen by PCP recently-started on steroids/cefdinir/Lasix dose escalated)-found to have acute hypoxic respiratory failure due to PNA and decompensated heart failure.  See below for further details.   Significant events: 7/5>> admit to [facility]-hypoxia-PNA/HFpEF exacerbation-started on BiPAP 7/6>> liberated off BiPAP-transition to 8 L HFNC.  Significant studies: 7/5>> CXR: Bibasilar opacities 7/5>> CXR: Right basilar atelectasis-?  Right pleural effusion.  Significant microbiology data: 7/5>> COVID/influenza PCR: Negative 7/5>> respiratory virus panel: Negative 7/5>> blood culture: No growth 7/6>> blood culture: Pending  Procedures:   Consults: None  Subjective: No major issues overnight-was on room air this morning.  Objective: Vitals: Blood pressure 120/84, pulse 84, temperature 97.7 F (36.5 C), temperature source Oral, resp. rate 17, height  (1.727 m), weight (!) 157.4 kg, SpO2 94 %.   Exam: Gen Exam:Alert awake-not in any distress HEENT:atraumatic, normocephalic Chest: B/L clear to auscultation anteriorly CVS:S1S2 regular Abdomen:soft non tender, non distended Extremities: Trace edema Neurology: Non focal Skin: no rash   Pertinent Labs/Radiology:    Latest Ref Rng & Units 07/16/2021    1:57 AM 07/15/2021    3:16 AM 07/15/2021    1:47 AM  CBC  WBC 4.0 - 10.5 K/uL 13.4  13.1    Hemoglobin 13.0 - 17.0 g/dL 40.9  81.1  91.4   Hematocrit 39.0 - 52.0 % 51.2  53.2  50.0   Platelets 150 - 400 K/uL 232  221      Lab Results   Component Value Date   NA 138 07/21/2021   K 3.5 07/21/2021   CL 94 (L) 07/21/2021   CO2 33 (H) 07/21/2021    Assessment/Plan: Acute hypoxic/hypercarbic respiratory failure: Etiology felt to be multifactorial from HFpEF exacerbation-COPD exacerbation/PNA/OSA/OHS.  Hypoxia significantly improved-was on room air this morning-we will plan to repeat ambulatory O2 saturation screen tomorrow to see if he actually requires home O2.     Sepsis due to PNA: Sepsis physiology resolved-hypoxia improving-has completed antimicrobial therapy.  Cultures negative so far.    HFpEF exacerbation: Significant improvement in volume status volume status ( > -14L balance)-some trace lower extremity edema persist-continue IV Lasix for another day or so.  Follow weights/electrolytes/volume status closely.  COPD exacerbation: Significant clinical improvement-hardly any rhonchi-continue bronchodilators.  No longer on prednisone.    OHS/OSA: Claims that he has a NIV at his shelter-unclear how compliant he is to this-needs to continue NIV/BiPAP nightly and whenever sleeping.   Acute bilateral conjunctivitis: Continue erythromycin ointment.  DM-2.  (A1c 8.3 on 5/23) with uncontrolled hyperglycemia due to steroids: CBGs better now that prednisone has been discontinued-suspect he may require less amounts of insulin going forward.  Given the fact that he is homeless-psych issues-unclear whether he will be able to administer himself insulin safely.  Worried that he may not be able to recognize symptoms of hypoglycemia.  We will continue Tradjenta/Farxiga-I will go ahead and restart his metformin.  I think it is probably safer to put him on oral agents on discharge rather  than insulin.  I will have our diabetic coordinator team evaluate him as well.   Recent Labs    07/20/21 2105 07/21/21 0739 07/21/21 1129  GLUCAP 224* 218* 275*     HLD: Continue statin  Schizoaffective disorder-bipolar type/anxiety/depression:  Continue risperidone, Cymbalta-holding Neurontin until hypercarbia improves further.  Tobacco abuse: Continue transdermal nicotine  Debility/deconditioning: Continue to mobilize with PT/OT.  Homelessness: Child psychotherapist evaluation prior to discharge  Morbid Obesity: Estimated body mass index is 52.76 kg/m as calculated from the following:   Height as of this encounter: 5\' 8"  (1.727 m).   Weight as of this encounter: 157.4 kg.   Code status:   Code Status: Full Code   DVT Prophylaxis: Prophylactic Lovenox   Family Communication: None at bedside (per patient he has no family members)   Disposition Plan: Status is: Inpatient Remains inpatient appropriate because: Improving hypoxia/hypercarbia due to CHF/PNA-not yet stable for discharge.     Planned Discharge Destination: Back to homeless shelter later this week-possibly on Friday   Diet: Diet Order             Diet heart healthy/carb modified Room service appropriate? Yes; Fluid consistency: Thin; Fluid restriction: 1200 mL Fluid  Diet effective now                     Antimicrobial agents: Anti-infectives (From admission, onward)    Start     Dose/Rate Route Frequency Ordered Stop   07/15/21 1545  cefTRIAXone (ROCEPHIN) 2 g in sodium chloride 0.9 % 100 mL IVPB  Status:  Discontinued        2 g 200 mL/hr over 30 Minutes Intravenous Every 24 hours 07/14/21 1841 07/19/21 1141   07/14/21 2200  doxycycline (VIBRA-TABS) tablet 100 mg  Status:  Discontinued        100 mg Oral Every 12 hours 07/14/21 1920 07/19/21 1141   07/14/21 1545  cefTRIAXone (ROCEPHIN) 2 g in sodium chloride 0.9 % 100 mL IVPB        2 g 200 mL/hr over 30 Minutes Intravenous  Once 07/14/21 1540 07/14/21 1702   07/14/21 1545  azithromycin (ZITHROMAX) 500 mg in sodium chloride 0.9 % 250 mL IVPB  Status:  Discontinued        500 mg 250 mL/hr over 60 Minutes Intravenous Every 24 hours 07/14/21 1540 07/14/21 1920        MEDICATIONS: Scheduled  Meds:  arformoterol  15 mcg Nebulization BID   atorvastatin  10 mg Oral Daily   budesonide (PULMICORT) nebulizer solution  0.25 mg Nebulization BID   dapagliflozin propanediol  10 mg Oral Daily   DULoxetine  60 mg Oral Daily   enoxaparin (LOVENOX) injection  80 mg Subcutaneous Q24H   famotidine  20 mg Oral BID   fluticasone  2 spray Each Nare Daily   furosemide  40 mg Intravenous BID   guaiFENesin  600 mg Oral BID   hydrocortisone  25 mg Rectal BID   insulin aspart  0-20 Units Subcutaneous TID WC   insulin aspart  0-5 Units Subcutaneous QHS   insulin aspart  12 Units Subcutaneous TID WC   insulin glargine-yfgn  35 Units Subcutaneous Daily   linagliptin  5 mg Oral Daily   metFORMIN  1,000 mg Oral BID WC   nicotine  21 mg Transdermal Daily   mouth rinse  15 mL Mouth Rinse 4 times per day   polyethylene glycol  17 g Oral Daily   revefenacin  175  mcg Nebulization Daily   risperidone  4 mg Oral Daily   sodium chloride flush  3 mL Intravenous Q12H   tamsulosin  0.4 mg Oral Daily   Continuous Infusions:  sodium chloride     PRN Meds:.sodium chloride, acetaminophen **OR** acetaminophen, albuterol, mouth rinse, sodium chloride flush   I have personally reviewed following labs and imaging studies  LABORATORY DATA: CBC: Recent Labs  Lab 07/14/21 1550 07/14/21 1557 07/15/21 0147 07/15/21 0316 07/16/21 0157  WBC 12.0*  --   --  13.1* 13.4*  HGB 15.6 16.7 17.0 15.5 15.0  HCT 54.1* 49.0 50.0 53.2* 51.2  MCV 92.8  --   --  91.4 88.3  PLT 268  --   --  221 232     Basic Metabolic Panel: Recent Labs  Lab 07/15/21 0316 07/16/21 0157 07/17/21 0155 07/18/21 0158 07/19/21 0805 07/20/21 0230 07/21/21 0246  NA 146*   < > 138 138 137 137 138  K 3.9   < > 4.1 4.0 3.8 3.7 3.5  CL 99   < > 88* 87* 89* 91* 94*  CO2 37*   < > 34* 38* 31 32 33*  GLUCOSE 157*   < > 232* 310* 173* 202* 165*  BUN 11   < > 20 27* 31* 34* 31*  CREATININE 1.01   < > 0.86 0.96 0.96 1.02 0.91  CALCIUM  7.7*   < > 8.6* 9.0 9.1 8.7* 8.7*  MG 2.2  --   --   --   --   --   --    < > = values in this interval not displayed.     GFR: Estimated Creatinine Clearance: 146 mL/min (by C-G formula based on SCr of 0.91 mg/dL).  Liver Function Tests: Recent Labs  Lab 07/14/21 1550  AST 20  ALT 29  ALKPHOS 77  BILITOT 0.6  PROT 6.0*  ALBUMIN 2.8*    No results for input(s): "LIPASE", "AMYLASE" in the last 168 hours. No results for input(s): "AMMONIA" in the last 168 hours.  Coagulation Profile: No results for input(s): "INR", "PROTIME" in the last 168 hours.  Cardiac Enzymes: No results for input(s): "CKTOTAL", "CKMB", "CKMBINDEX", "TROPONINI" in the last 168 hours.  BNP (last 3 results) No results for input(s): "PROBNP" in the last 8760 hours.  Lipid Profile: No results for input(s): "CHOL", "HDL", "LDLCALC", "TRIG", "CHOLHDL", "LDLDIRECT" in the last 72 hours.  Thyroid Function Tests: No results for input(s): "TSH", "T4TOTAL", "FREET4", "T3FREE", "THYROIDAB" in the last 72 hours.  Anemia Panel: No results for input(s): "VITAMINB12", "FOLATE", "FERRITIN", "TIBC", "IRON", "RETICCTPCT" in the last 72 hours.  Urine analysis:    Component Value Date/Time   COLORURINE YELLOW (A) 11/16/2020 2238   APPEARANCEUR HAZY (A) 11/16/2020 2238   LABSPEC >1.046 (H) 11/16/2020 2238   PHURINE 5.0 11/16/2020 2238   GLUCOSEU NEGATIVE 11/16/2020 2238   HGBUR NEGATIVE 11/16/2020 2238   BILIRUBINUR NEGATIVE 11/16/2020 2238   KETONESUR NEGATIVE 11/16/2020 2238   PROTEINUR NEGATIVE 11/16/2020 2238   NITRITE NEGATIVE 11/16/2020 2238   LEUKOCYTESUR NEGATIVE 11/16/2020 2238    Sepsis Labs: Lactic Acid, Venous    Component Value Date/Time   LATICACIDVEN 1.6 07/14/2021 1724    MICROBIOLOGY: Recent Results (from the past 240 hour(s))  Resp Panel by RT-PCR (Flu A&B, Covid) Anterior Nasal Swab     Status: None   Collection Time: 07/14/21  3:11 PM   Specimen: Anterior Nasal Swab  Result Value  Ref Range Status  SARS Coronavirus 2 by RT PCR NEGATIVE NEGATIVE Final    Comment: (NOTE) SARS-CoV-2 target nucleic acids are NOT DETECTED.  The SARS-CoV-2 RNA is generally detectable in upper respiratory specimens during the acute phase of infection. The lowest concentration of SARS-CoV-2 viral copies this assay can detect is 138 copies/mL. A negative result does not preclude SARS-Cov-2 infection and should not be used as the sole basis for treatment or other patient management decisions. A negative result may occur with  improper specimen collection/handling, submission of specimen other than nasopharyngeal swab, presence of viral mutation(s) within the areas targeted by this assay, and inadequate number of viral copies(<138 copies/mL). A negative result must be combined with clinical observations, patient history, and epidemiological information. The expected result is Negative.  Fact Sheet for Patients:  BloggerCourse.com  Fact Sheet for Healthcare Providers:  SeriousBroker.it  This test is no t yet approved or cleared by the [place] FDA and  has been authorized for detection and/or diagnosis of SARS-CoV-2 by FDA under an Emergency Use Authorization (EUA). This EUA will remain  in effect (meaning this test can be used) for the duration of the COVID-19 declaration under Section 564(b)(1) of the Act, 21 U.S.C.section 360bbb-3(b)(1), unless the authorization is terminated  or revoked sooner.       Influenza A by PCR NEGATIVE NEGATIVE Final   Influenza B by PCR NEGATIVE NEGATIVE Final    Comment: (NOTE) The Xpert Xpress SARS-CoV-2/FLU/RSV plus assay is intended as an aid in the diagnosis of influenza from Nasopharyngeal swab specimens and should not be used as a sole basis for treatment. Nasal washings and aspirates are unacceptable for Xpert Xpress SARS-CoV-2/FLU/RSV testing.  Fact Sheet for  Patients: BloggerCourse.com  Fact Sheet for Healthcare Providers: SeriousBroker.it  This test is not yet approved or cleared by the [place] FDA and has been authorized for detection and/or diagnosis of SARS-CoV-2 by FDA under an Emergency Use Authorization (EUA). This EUA will remain in effect (meaning this test can be used) for the duration of the COVID-19 declaration under Section 564(b)(1) of the Act, 21 U.S.C. section 360bbb-3(b)(1), unless the authorization is terminated or revoked.  Performed at [facility], [address], [place], [place] [address]   Respiratory (~20 pathogens) panel by PCR     Status: None   Collection Time: 07/14/21  3:11 PM   Specimen: Nasopharyngeal Swab; Respiratory  Result Value Ref Range Status   Adenovirus NOT DETECTED NOT DETECTED Final   Coronavirus 229E NOT DETECTED NOT DETECTED Final    Comment: (NOTE) The Coronavirus on the Respiratory Panel, DOES NOT test for the novel  Coronavirus (2019 nCoV)    Coronavirus HKU1 NOT DETECTED NOT DETECTED Final   Coronavirus NL63 NOT DETECTED NOT DETECTED Final   Coronavirus OC43 NOT DETECTED NOT DETECTED Final   Metapneumovirus NOT DETECTED NOT DETECTED Final   Rhinovirus / Enterovirus NOT DETECTED NOT DETECTED Final   Influenza A NOT DETECTED NOT DETECTED Final   Influenza B NOT DETECTED NOT DETECTED Final   Parainfluenza Virus 1 NOT DETECTED NOT DETECTED Final   Parainfluenza Virus 2 NOT DETECTED NOT DETECTED Final   Parainfluenza Virus 3 NOT DETECTED NOT DETECTED Final   Parainfluenza Virus 4 NOT DETECTED NOT DETECTED Final   Respiratory Syncytial Virus NOT DETECTED NOT DETECTED Final   Bordetella pertussis NOT DETECTED NOT DETECTED Final   Bordetella Parapertussis NOT DETECTED NOT DETECTED Final   Chlamydophila pneumoniae NOT DETECTED NOT DETECTED Final   Mycoplasma pneumoniae NOT  DETECTED NOT DETECTED Final    Comment: Performed at  [facility], [address], [place], [place] [address]  Culture, blood (routine x 2)     Status: None   Collection Time: 07/14/21  4:50 PM   Specimen: BLOOD LEFT HAND  Result Value Ref Range Status   Specimen Description BLOOD LEFT HAND  Final   Special Requests   Final    BOTTLES DRAWN AEROBIC AND ANAEROBIC Blood Culture adequate volume   Culture   Final    NO GROWTH 5 DAYS Performed at [facility] [place] [facility], [address], [place], [place] [address]    Report Status 07/19/2021 FINAL  Final  Culture, blood (routine x 2)     Status: None   Collection Time: 07/15/21  5:58 AM   Specimen: BLOOD  Result Value Ref Range Status   Specimen Description BLOOD RIGHT ANTECUBITAL  Final   Special Requests   Final    BOTTLES DRAWN AEROBIC AND ANAEROBIC Blood Culture adequate volume   Culture   Final    NO GROWTH 5 DAYS Performed at [facility], [address] [place], [place] [address]    Report Status 07/20/2021 FINAL  Final    RADIOLOGY STUDIES/RESULTS: VAS [place] LOWER EXTREMITY VENOUS (DVT)  Result Date: 07/20/2021  Lower Venous DVT Study Patient Name:  JOZEPH PERSING  Date of Exam:   07/20/2021 Medical Rec #: [id number]      Accession #:    [id number] Date of Birth: 04-04-1970      Patient Gender: M Patient Age:   77 years Exam Location:  [facility] Procedure:      VAS [place] LOWER EXTREMITY VENOUS (DVT) Referring Phys: Jeoffrey Massed --------------------------------------------------------------------------------  Indications: Swelling.  Risk Factors: Obesity. Limitations: Body habitus, poor ultrasound/tissue interface and patient cooperation. Comparison Study: Previous exam on 10/20/20 was negative for DVT Performing Technologist: Ernestene Mention RVT, RDMS  Examination Guidelines: A complete evaluation includes B-mode imaging, spectral Doppler, color Doppler, and power Doppler as needed of all accessible portions of each vessel. Bilateral testing is considered an integral part of a  complete examination. Limited examinations for reoccurring indications may be performed as noted. The reflux portion of the exam is performed with the patient in reverse Trendelenburg.  +---------+---------------+---------+-----------+----------+--------------+ RIGHT    CompressibilityPhasicitySpontaneityPropertiesThrombus Aging +---------+---------------+---------+-----------+----------+--------------+ CFV      Full           Yes      Yes                                 +---------+---------------+---------+-----------+----------+--------------+ SFJ      Full                                                        +---------+---------------+---------+-----------+----------+--------------+ FV Prox  Full           Yes      Yes                                 +---------+---------------+---------+-----------+----------+--------------+ FV Mid   Full           Yes      Yes                                 +---------+---------------+---------+-----------+----------+--------------+  FV DistalFull           Yes      Yes                                 +---------+---------------+---------+-----------+----------+--------------+ PFV      Full                                                        +---------+---------------+---------+-----------+----------+--------------+ POP      Full           Yes      Yes                                 +---------+---------------+---------+-----------+----------+--------------+ PTV      Full                                                        +---------+---------------+---------+-----------+----------+--------------+ PERO     Full                                                        +---------+---------------+---------+-----------+----------+--------------+   +---------+---------------+---------+-----------+----------+-------------------+ LEFT     CompressibilityPhasicitySpontaneityPropertiesThrombus Aging       +---------+---------------+---------+-----------+----------+-------------------+ CFV      Full           Yes      Yes                                      +---------+---------------+---------+-----------+----------+-------------------+ SFJ      Full                                                             +---------+---------------+---------+-----------+----------+-------------------+ FV Prox  Full           Yes      Yes                                      +---------+---------------+---------+-----------+----------+-------------------+ FV Mid   Full           Yes      Yes                                      +---------+---------------+---------+-----------+----------+-------------------+ FV DistalFull           Yes      Yes                                      +---------+---------------+---------+-----------+----------+-------------------+  PFV      Full                                                             +---------+---------------+---------+-----------+----------+-------------------+ POP      Full           Yes      Yes                                      +---------+---------------+---------+-----------+----------+-------------------+ PTV      Full                                                             +---------+---------------+---------+-----------+----------+-------------------+ PERO                                                  Not well visualized +---------+---------------+---------+-----------+----------+-------------------+     Summary: BILATERAL: - No evidence of deep vein thrombosis seen in the lower extremities, bilaterally. -No evidence of popliteal cyst, bilaterally.   *See table(s) above for measurements and observations. Electronically signed by Lemar Livings MD on 07/20/2021 at 2:59:37 PM.    Final      LOS: 7 days   Jeoffrey Massed, MD  [facility]    To contact the attending provider between 7A-7P or  the covering provider during after hours 7P-7A, please log into the web site [email or web address] and access using universal [place] password for that web site. If you do not have the password, please call the hospital operator.  07/21/2021, 12:34 PM

## 2021-07-21 NOTE — Progress Notes (Signed)
Inpatient Diabetes Program Recommendations  AACE/ADA: New Consensus Statement on Inpatient Glycemic Control (2015)  Target Ranges:  Prepandial:   less than 140 mg/dL      Peak postprandial:   less than 180 mg/dL (1-2 hours)      Critically ill patients:  140 - 180 mg/dL   Lab Results  Component Value Date   GLUCAP 275 (H) 07/21/2021   HGBA1C 8.3 (A) 06/01/2021    Review of Glycemic Control  Diabetes history: DM2 Outpatient Diabetes medications: Farxiga 20 QD, metformin 1000 mg BID Current orders for Inpatient glycemic control: Semglee 35 units QD, Novolog 0-20 units TID with meals and 0-5 HS + 12 units TID, tradjenta 5 mg QD, metformin 1000 mg  BID, Farxiga 10 mg QD  HgbA1C - 8.3% 218, 275 mg/dL  Inpatient Diabetes Program Recommendations:    Increase Semglee to 40 units QD Increase Novolog to 15 units TID  Pt will definitely need to be discharged on insulin. Received 89 units of Novolog correction yesterday.  Will follow up.  Thank you. Ailene Ards, RD, LDN, CDE Inpatient Diabetes Coordinator [phone]

## 2021-07-21 NOTE — TOC Progression Note (Signed)
Transition of Care [facility]) - Progression Note    Patient Details  Name: Raymond Hood MRN: [id number] Date of Birth: [birth year]  Transition of [facility]) CM/SW Contact  Epifanio Lesches, RN Phone Number: 07/21/2021, 1:22 PM  Clinical Narrative:    NCM received call from nurse [name]  ( works Wed./ Printmaker.) at [employer] [facility] [employer], [phone]. [name] stated [facility] is saying they now cannot take this pt back to the shelter if he is going to be on O2, MD made aware .  TOC team will continue to monitor for needs...  Expected Discharge Plan: Skilled Nursing Facility Barriers to Discharge: Continued Medical Work up  Expected Discharge Plan and Services Expected Discharge Plan: Skilled Nursing Facility   Discharge Planning Services: CM Consult   Living arrangements for the past 2 months: Homeless Shelter (since Nov. 2022)                                       Social Determinants of Health (SDOH) Interventions    Readmission Risk Interventions     No data to display

## 2021-07-21 NOTE — Progress Notes (Signed)
Physical Therapy Treatment Patient Details Name: Raymond Hood MRN: [id number] DOB: [birth year] Today's Date: 07/21/2021   History of Present Illness 51 y/o male presented to ED on 07/14/21 for SOB, cough, hypoxia. Found to have pneumonia. Admitted for acute on chronic respiratory failure with hypoxia. PMH: COPD, bipolar 1 disorder, T2DM, HTN, OSA, RA    PT Comments    Pt making steady progress towards his physical therapy goals; demonstrating improved activity tolerance and ambulation distance. Pt ambulating 120 ft with a Rollator at a supervision level. SpO2 94% on RA. Would benefit from continued mobility progression to address endurance deficits.     Recommendations for follow up therapy are one component of a multi-disciplinary discharge planning process, led by the attending physician.  Recommendations may be updated based on patient status, additional functional criteria and insurance authorization.  Follow Up Recommendations  No PT follow up     Assistance Recommended at Discharge Frequent or constant Supervision/Assistance  Patient can return home with the following A little help with bathing/dressing/bathroom;Assist for transportation   Equipment Recommendations  Rollator (4 wheels)    Recommendations for Other Services       Precautions / Restrictions Precautions Precautions: Fall Restrictions Weight Bearing Restrictions: No     Mobility  Bed Mobility Overal bed mobility: Modified Independent                  Transfers Overall transfer level: Needs assistance Equipment used: Rollator (4 wheels) Transfers: Sit to/from Stand Sit to Stand: Supervision           General transfer comment: no physical assist required    Ambulation/Gait Ambulation/Gait assistance: Supervision Gait Distance (Feet): 120 Feet Assistive device: Rollator (4 wheels) Gait Pattern/deviations: Step-through pattern, Trunk flexed Gait velocity: decreased     General Gait  Details: slow steady gait, distance limited by pt complaints of fatigue   Stairs             Wheelchair Mobility    Modified Rankin (Stroke Patients Only)       Balance Overall balance assessment: Mild deficits observed, not formally tested                                          Cognition Arousal/Alertness: Awake/alert Behavior During Therapy: Flat affect Overall Cognitive Status: No family/caregiver present to determine baseline cognitive functioning                                 General Comments: hx of schizoaffective/bipolar disorder. flat affect but follows directions consistently. agitated by therapy attempts to mobilize        Exercises      General Comments        Pertinent Vitals/Pain Pain Assessment Pain Assessment: Faces Faces Pain Scale: No hurt    Home Living                          Prior Function            PT Goals (current goals can now be found in the care plan section) Acute Rehab PT Goals Patient Stated Goal: to get better PT Goal Formulation: With patient Time For Goal Achievement: 07/30/21 Potential to Achieve Goals: Good Progress towards PT goals: Progressing toward goals    Frequency  Min 3X/week      PT Plan Current plan remains appropriate    Co-evaluation              AM-PAC PT "6 Clicks" Mobility   Outcome Measure  Help needed turning from your back to your side while in a flat bed without using bedrails?: None Help needed moving from lying on your back to sitting on the side of a flat bed without using bedrails?: None Help needed moving to and from a bed to a chair (including a wheelchair)?: A Little Help needed standing up from a chair using your arms (e.g., wheelchair or bedside chair)?: A Little Help needed to walk in hospital room?: A Little Help needed climbing 3-5 steps with a railing? : A Little 6 Click Score: 20    End of Session   Activity  Tolerance: Patient tolerated treatment well;Patient limited by fatigue Patient left: in bed;with call bell/phone within reach (per pt request) Nurse Communication: Mobility status PT Visit Diagnosis: Other abnormalities of gait and mobility (R26.89);Unsteadiness on feet (R26.81);Muscle weakness (generalized) (M62.81)     Time: 3664-4034 PT Time Calculation (min) (ACUTE ONLY): 19 min  Charges:  $Therapeutic Activity: 8-22 mins                     Lillia Pauls, PT, DPT Acute Rehabilitation Services Office [phone]    Norval Morton 07/21/2021, 1:29 PM

## 2021-07-22 LAB — BASIC METABOLIC PANEL
Anion gap: 9 (ref 5–15)
BUN: 27 mg/dL — ABNORMAL HIGH (ref 6–20)
CO2: 33 mmol/L — ABNORMAL HIGH (ref 22–32)
Calcium: 8.7 mg/dL — ABNORMAL LOW (ref 8.9–10.3)
Chloride: 95 mmol/L — ABNORMAL LOW (ref 98–111)
Creatinine, Ser: 0.91 mg/dL (ref 0.61–1.24)
GFR, Estimated: >60 mL/min (ref >60)
Glucose, Bld: 160 mg/dL — ABNORMAL HIGH (ref 70–99)
Potassium: 3.1 mmol/L — ABNORMAL LOW (ref 3.5–5.1)
Sodium: 137 mmol/L (ref 135–145)

## 2021-07-22 LAB — GLUCOSE, CAPILLARY
Glucose-Capillary: 214 mg/dL — ABNORMAL HIGH (ref 70–99)
Glucose-Capillary: 215 mg/dL — ABNORMAL HIGH (ref 70–99)
Glucose-Capillary: 261 mg/dL — ABNORMAL HIGH (ref 70–99)
Glucose-Capillary: 216 mg/dL — ABNORMAL HIGH (ref 70–99)

## 2021-07-22 MED ORDER — INSULIN STARTER KIT- PEN NEEDLES (ENGLISH)
1.0000 | Freq: Once | Status: DC
  Filled 2021-07-22: qty 1

## 2021-07-22 MED ORDER — ALBUTEROL SULFATE (2.5 MG/3ML) 0.083% IN NEBU
2.5000 mg | INHALATION_SOLUTION | Freq: Four times a day (QID) | RESPIRATORY_TRACT | Status: DC
  Administered 2021-07-22 – 2021-07-25 (×14): 2.5 mg via RESPIRATORY_TRACT
  Filled 2021-07-22 (×14): qty 3

## 2021-07-22 MED ORDER — GABAPENTIN 300 MG PO CAPS
300.0000 mg | ORAL_CAPSULE | Freq: Two times a day (BID) | ORAL | Status: DC
  Administered 2021-07-22 – 2021-07-26 (×9): 300 mg via ORAL
  Filled 2021-07-22 (×9): qty 1

## 2021-07-22 MED ORDER — POTASSIUM CHLORIDE CRYS ER 20 MEQ PO TBCR
40.0000 meq | EXTENDED_RELEASE_TABLET | Freq: Once | ORAL | Status: AC
  Administered 2021-07-22: 40 meq via ORAL
  Filled 2021-07-22: qty 2

## 2021-07-22 NOTE — Progress Notes (Signed)
Mobility Specialist Progress Note   07/22/21 1700  Mobility  Activity Ambulated with assistance in hallway  Level of Assistance Standby assist, set-up cues, supervision of patient - no hands on  Assistive Device Four wheel walker  Distance Ambulated (ft) 204 ft  Activity Response Tolerated well  $Mobility charge 1 Mobility   Pre Mobility: 92% SpO2 During Mobility: 95% SpO2 Post Mobility: 91% SpO2  Patient received in chair on RA agreeable to participate in mobility after dinner. Ambulated in hallway with stand by assist maintaining >93% SpO2, no breaks needed. Returned to room without complaint or incident. Was left in bed with all needs met and call bell in reach.    Holland Falling Mobility Specialist MS [facility] #:  [phone] Acute Rehab Office:  [phone]

## 2021-07-22 NOTE — Progress Notes (Addendum)
Today we have encouraged Raymond Hood to be more mobile and educate on DM and HF.  He is on a fluid restriction, however, he is not adhering to that. He consistently asks ancillary staff for more gingerale. I did explain that we are trying to take off more than we put it. He verbalized understanding. He has stayed on room air since 12 noon, he did a bedside bath, a walk, and sat in chair for a few hours.

## 2021-07-22 NOTE — Progress Notes (Signed)
PROGRESS NOTE        PATIENT DETAILS Name: Raymond Hood Age: 51 y.o. Sex: male Date of Birth: 1970/05/19 Admit Date: 07/14/2021 Admitting Physician Orland Mustard, MD ZOX:[id number], Charlcie Cradle, MD  Brief Summary: Patient is a 51 y.o.  male with history of COPD, OSA/OHS, HFpEF, HTN, DM-2, schizoaffective/bipolar disorder, homelessness, ongoing tobacco use-presented with several days history of worsening shortness of breath (seen by PCP recently-started on steroids/cefdinir/Lasix dose escalated)-found to have acute hypoxic respiratory failure due to PNA and decompensated heart failure.  See below for further details.   Significant events: 7/5>> admit to [facility]-hypoxia-PNA/HFpEF exacerbation-started on BiPAP 7/6>> liberated off BiPAP-transition to 8 L HFNC.  Significant studies: 7/5>> CXR: Bibasilar opacities 7/5>> CXR: Right basilar atelectasis-?  Right pleural effusion.  Significant microbiology data: 7/5>> COVID/influenza PCR: Negative 7/5>> respiratory virus panel: Negative 7/5>> blood culture: No growth 7/6>> blood culture: Pending  Procedures:   Consults: None  Subjective: Continues to slowly improve-on 1-2 L of oxygen this morning.  Breathing is better.  Objective: Vitals: Blood pressure (!) 121/96, pulse 96, temperature 97.6 F (36.4 C), temperature source Oral, resp. rate 18, height  (1.727 m), weight (!) 159.8 kg, SpO2 92 %.   Exam: Gen Exam:Alert awake-not in any distress HEENT:atraumatic, normocephalic Chest: Still some scattered rhonchi but moving air well. CVS:S1S2 regular Abdomen:soft non tender, non distended Extremities: Trace edema Neurology: Non focal Skin: no rash   Pertinent Labs/Radiology:    Latest Ref Rng & Units 07/16/2021    1:57 AM 07/15/2021    3:16 AM 07/15/2021    1:47 AM  CBC  WBC 4.0 - 10.5 K/uL 13.4  13.1    Hemoglobin 13.0 - 17.0 g/dL 40.9  81.1  91.4   Hematocrit 39.0 - 52.0 % 51.2  53.2  50.0   Platelets  150 - 400 K/uL 232  221      Lab Results  Component Value Date   NA 137 07/22/2021   K 3.1 (L) 07/22/2021   CL 95 (L) 07/22/2021   CO2 33 (H) 07/22/2021    Assessment/Plan: Acute hypoxic/hypercarbic respiratory failure: Etiology felt to be multifactorial from HFpEF exacerbation-COPD exacerbation/PNA/OSA/OHS.  Overall improved but still not at a level where he needs to be to go back to a shelter.  Yesterday when he walked with PT-did not require oxygen-he is requiring 1-2 L of oxygen this morning.  Suspect that with continued treatment of underlying pathologies-he will likely improved.-Discussed with RT/nursing staff-he needs to be out of bed to chair-and needs to be using his incentive spirometry as much as possible.  At Dr. Lynelle Doctor recommendation we will switch him to scheduled albuterol-stop Brovana.   Sepsis due to PNA: Sepsis physiology resolved-hypoxia improving-has completed antimicrobial therapy.  Cultures negative so far.    HFpEF exacerbation: Significant improvement in volume status volume status ( > -17L balance, weight down to 352 pounds)-some trace lower extremity edema persist-continue IV Lasix for another day or so.  Follow weights/electrolytes/volume status closely.  COPD exacerbation: Significant clinical improvement-hardly any rhonchi-continue bronchodilators.  No longer on prednisone.    OHS/OSA: Claims that he has a NIV at his shelter-unclear how compliant he is to this-needs to continue NIV/BiPAP nightly and whenever sleeping.   Acute bilateral conjunctivitis: Continue erythromycin ointment.  DM-2.  (A1c 8.3 on 5/23) with uncontrolled hyperglycemia due to steroids: CBGs better-off steroids since 7/12.  Continue Semglee 35 units daily, 12 units of NovoLog with meals-along with Farxiga/metformin/Tradjenta.  I had some hesitation in discharging him to the shelter with insulin-but per my conversation with Dr. Delford Field on 7/13-he will be able to get assistance with insulin at  the shelter-therefore-we will plan on discharging him on insulin.  I will ask the nursing staff/diabetic coordinator to start insulin teaching/education.  Recent Labs    07/21/21 1948 07/22/21 0758 07/22/21 1132  GLUCAP 207* 214* 215*     HLD: Continue statin  Schizoaffective disorder-bipolar type/anxiety/depression: Continue risperidone, Cymbalta-noted hypercarbia resolved-resume low-dose Neurontin.  Tobacco abuse: Continue transdermal nicotine  Debility/deconditioning: Continue to mobilize with PT/OT.  Homelessness: Child psychotherapist evaluation prior to discharge  Morbid Obesity: Estimated body mass index is 53.55 kg/m as calculated from the following:   Height as of this encounter:  (1.727 m).   Weight as of this encounter: 159.8 kg.   Code status:   Code Status: Full Code   DVT Prophylaxis: Prophylactic Lovenox   Family Communication: None at bedside (per patient he has no family members)   Disposition Plan: Status is: Inpatient Remains inpatient appropriate because: Improving hypoxia/hypercarbia due to CHF/PNA-not yet stable for discharge.  Needs a few more days for optimization-a shelter cannot take him back on oxygen-hopefully we can titrate him off oxygen consistently over the next few days.   Planned Discharge Destination: Back to homeless shelter likely 7/17 or 7/18 once medically optimized.  Diet: Diet Order             Diet heart healthy/carb modified Room service appropriate? Yes; Fluid consistency: Thin; Fluid restriction: 1200 mL Fluid  Diet effective now                     Antimicrobial agents: Anti-infectives (From admission, onward)    Start     Dose/Rate Route Frequency Ordered Stop   07/15/21 1545  cefTRIAXone (ROCEPHIN) 2 g in sodium chloride 0.9 % 100 mL IVPB  Status:  Discontinued        2 g 200 mL/hr over 30 Minutes Intravenous Every 24 hours 07/14/21 1841 07/19/21 1141   07/14/21 2200  doxycycline (VIBRA-TABS) tablet 100 mg   Status:  Discontinued        100 mg Oral Every 12 hours 07/14/21 1920 07/19/21 1141   07/14/21 1545  cefTRIAXone (ROCEPHIN) 2 g in sodium chloride 0.9 % 100 mL IVPB        2 g 200 mL/hr over 30 Minutes Intravenous  Once 07/14/21 1540 07/14/21 1702   07/14/21 1545  azithromycin (ZITHROMAX) 500 mg in sodium chloride 0.9 % 250 mL IVPB  Status:  Discontinued        500 mg 250 mL/hr over 60 Minutes Intravenous Every 24 hours 07/14/21 1540 07/14/21 1920        MEDICATIONS: Scheduled Meds:  arformoterol  15 mcg Nebulization BID   atorvastatin  10 mg Oral Daily   budesonide (PULMICORT) nebulizer solution  0.25 mg Nebulization BID   dapagliflozin propanediol  10 mg Oral Daily   DULoxetine  60 mg Oral Daily   enoxaparin (LOVENOX) injection  80 mg Subcutaneous Q24H   famotidine  20 mg Oral BID   fluticasone  2 spray Each Nare Daily   furosemide  40 mg Intravenous BID   guaiFENesin  600 mg Oral BID   hydrocortisone  25 mg Rectal BID   insulin aspart  0-20 Units Subcutaneous TID WC   insulin aspart  0-5  Units Subcutaneous QHS   insulin aspart  12 Units Subcutaneous TID WC   insulin glargine-yfgn  35 Units Subcutaneous Daily   linagliptin  5 mg Oral Daily   metFORMIN  1,000 mg Oral BID WC   nicotine  21 mg Transdermal Daily   mouth rinse  15 mL Mouth Rinse 4 times per day   polyethylene glycol  17 g Oral Daily   revefenacin  175 mcg Nebulization Daily   risperidone  4 mg Oral Daily   sodium chloride flush  3 mL Intravenous Q12H   tamsulosin  0.4 mg Oral Daily   Continuous Infusions:  sodium chloride     PRN Meds:.sodium chloride, acetaminophen **OR** acetaminophen, albuterol, mouth rinse, sodium chloride flush   I have personally reviewed following labs and imaging studies  LABORATORY DATA: CBC: Recent Labs  Lab 07/16/21 0157  WBC 13.4*  HGB 15.0  HCT 51.2  MCV 88.3  PLT 232     Basic Metabolic Panel: Recent Labs  Lab 07/18/21 0158 07/19/21 0805 07/20/21 0230  07/21/21 0246 07/22/21 0211  NA 138 137 137 138 137  K 4.0 3.8 3.7 3.5 3.1*  CL 87* 89* 91* 94* 95*  CO2 38* 31 32 33* 33*  GLUCOSE 310* 173* 202* 165* 160*  BUN 27* 31* 34* 31* 27*  CREATININE 0.96 0.96 1.02 0.91 0.91  CALCIUM 9.0 9.1 8.7* 8.7* 8.7*     GFR: Estimated Creatinine Clearance: 147.4 mL/min (by C-G formula based on SCr of 0.91 mg/dL).  Liver Function Tests: No results for input(s): "AST", "ALT", "ALKPHOS", "BILITOT", "PROT", "ALBUMIN" in the last 168 hours.  No results for input(s): "LIPASE", "AMYLASE" in the last 168 hours. No results for input(s): "AMMONIA" in the last 168 hours.  Coagulation Profile: No results for input(s): "INR", "PROTIME" in the last 168 hours.  Cardiac Enzymes: No results for input(s): "CKTOTAL", "CKMB", "CKMBINDEX", "TROPONINI" in the last 168 hours.  BNP (last 3 results) No results for input(s): "PROBNP" in the last 8760 hours.  Lipid Profile: No results for input(s): "CHOL", "HDL", "LDLCALC", "TRIG", "CHOLHDL", "LDLDIRECT" in the last 72 hours.  Thyroid Function Tests: No results for input(s): "TSH", "T4TOTAL", "FREET4", "T3FREE", "THYROIDAB" in the last 72 hours.  Anemia Panel: No results for input(s): "VITAMINB12", "FOLATE", "FERRITIN", "TIBC", "IRON", "RETICCTPCT" in the last 72 hours.  Urine analysis:    Component Value Date/Time   COLORURINE YELLOW (A) 11/16/2020 2238   APPEARANCEUR HAZY (A) 11/16/2020 2238   LABSPEC >1.046 (H) 11/16/2020 2238   PHURINE 5.0 11/16/2020 2238   GLUCOSEU NEGATIVE 11/16/2020 2238   HGBUR NEGATIVE 11/16/2020 2238   BILIRUBINUR NEGATIVE 11/16/2020 2238   KETONESUR NEGATIVE 11/16/2020 2238   PROTEINUR NEGATIVE 11/16/2020 2238   NITRITE NEGATIVE 11/16/2020 2238   LEUKOCYTESUR NEGATIVE 11/16/2020 2238    Sepsis Labs: Lactic Acid, Venous    Component Value Date/Time   LATICACIDVEN 1.6 07/14/2021 1724    MICROBIOLOGY: Recent Results (from the past 240 hour(s))  Resp Panel by RT-PCR  (Flu A&B, Covid) Anterior Nasal Swab     Status: None   Collection Time: 07/14/21  3:11 PM   Specimen: Anterior Nasal Swab  Result Value Ref Range Status   SARS Coronavirus 2 by RT PCR NEGATIVE NEGATIVE Final    Comment: (NOTE) SARS-CoV-2 target nucleic acids are NOT DETECTED.  The SARS-CoV-2 RNA is generally detectable in upper respiratory specimens during the acute phase of infection. The lowest concentration of SARS-CoV-2 viral copies this assay can detect is 138 copies/mL.  A negative result does not preclude SARS-Cov-2 infection and should not be used as the sole basis for treatment or other patient management decisions. A negative result may occur with  improper specimen collection/handling, submission of specimen other than nasopharyngeal swab, presence of viral mutation(s) within the areas targeted by this assay, and inadequate number of viral copies(<138 copies/mL). A negative result must be combined with clinical observations, patient history, and epidemiological information. The expected result is Negative.  Fact Sheet for Patients:  BloggerCourse.com  Fact Sheet for Healthcare Providers:  SeriousBroker.it  This test is no t yet approved or cleared by the [place] FDA and  has been authorized for detection and/or diagnosis of SARS-CoV-2 by FDA under an Emergency Use Authorization (EUA). This EUA will remain  in effect (meaning this test can be used) for the duration of the COVID-19 declaration under Section 564(b)(1) of the Act, 21 U.S.C.section 360bbb-3(b)(1), unless the authorization is terminated  or revoked sooner.       Influenza A by PCR NEGATIVE NEGATIVE Final   Influenza B by PCR NEGATIVE NEGATIVE Final    Comment: (NOTE) The Xpert Xpress SARS-CoV-2/FLU/RSV plus assay is intended as an aid in the diagnosis of influenza from Nasopharyngeal swab specimens and should not be used as a sole basis for  treatment. Nasal washings and aspirates are unacceptable for Xpert Xpress SARS-CoV-2/FLU/RSV testing.  Fact Sheet for Patients: BloggerCourse.com  Fact Sheet for Healthcare Providers: SeriousBroker.it  This test is not yet approved or cleared by the [place] FDA and has been authorized for detection and/or diagnosis of SARS-CoV-2 by FDA under an Emergency Use Authorization (EUA). This EUA will remain in effect (meaning this test can be used) for the duration of the COVID-19 declaration under Section 564(b)(1) of the Act, 21 U.S.C. section 360bbb-3(b)(1), unless the authorization is terminated or revoked.  Performed at [facility], [address] [place], [place] [address]   Respiratory (~20 pathogens) panel by PCR     Status: None   Collection Time: 07/14/21  3:11 PM   Specimen: Nasopharyngeal Swab; Respiratory  Result Value Ref Range Status   Adenovirus NOT DETECTED NOT DETECTED Final   Coronavirus 229E NOT DETECTED NOT DETECTED Final    Comment: (NOTE) The Coronavirus on the Respiratory Panel, DOES NOT test for the novel  Coronavirus (2019 nCoV)    Coronavirus HKU1 NOT DETECTED NOT DETECTED Final   Coronavirus NL63 NOT DETECTED NOT DETECTED Final   Coronavirus OC43 NOT DETECTED NOT DETECTED Final   Metapneumovirus NOT DETECTED NOT DETECTED Final   Rhinovirus / Enterovirus NOT DETECTED NOT DETECTED Final   Influenza A NOT DETECTED NOT DETECTED Final   Influenza B NOT DETECTED NOT DETECTED Final   Parainfluenza Virus 1 NOT DETECTED NOT DETECTED Final   Parainfluenza Virus 2 NOT DETECTED NOT DETECTED Final   Parainfluenza Virus 3 NOT DETECTED NOT DETECTED Final   Parainfluenza Virus 4 NOT DETECTED NOT DETECTED Final   Respiratory Syncytial Virus NOT DETECTED NOT DETECTED Final   Bordetella pertussis NOT DETECTED NOT DETECTED Final   Bordetella Parapertussis NOT DETECTED NOT DETECTED Final   Chlamydophila  pneumoniae NOT DETECTED NOT DETECTED Final   Mycoplasma pneumoniae NOT DETECTED NOT DETECTED Final    Comment: Performed at [facility], [address], [place], [place] [address]  Culture, blood (routine x 2)     Status: None   Collection Time: 07/14/21  4:50 PM   Specimen: BLOOD LEFT HAND  Result Value Ref  Range Status   Specimen Description BLOOD LEFT HAND  Final   Special Requests   Final    BOTTLES DRAWN AEROBIC AND ANAEROBIC Blood Culture adequate volume   Culture   Final    NO GROWTH 5 DAYS Performed at [facility], [address], [place], [place] [address]    Report Status 07/19/2021 FINAL  Final  Culture, blood (routine x 2)     Status: None   Collection Time: 07/15/21  5:58 AM   Specimen: BLOOD  Result Value Ref Range Status   Specimen Description BLOOD RIGHT ANTECUBITAL  Final   Special Requests   Final    BOTTLES DRAWN AEROBIC AND ANAEROBIC Blood Culture adequate volume   Culture   Final    NO GROWTH 5 DAYS Performed at [facility], [address] [place], [place] [address]    Report Status 07/20/2021 FINAL  Final    RADIOLOGY STUDIES/RESULTS: No results found.   LOS: 8 days   Jeoffrey Massed, MD  [facility]    To contact the attending provider between 7A-7P or the covering provider during after hours 7P-7A, please log into the web site [email or web address] and access using universal [place] password for that web site. If you do not have the password, please call the hospital operator.  07/22/2021, 1:03 PM

## 2021-07-22 NOTE — Progress Notes (Signed)
I rounded on the patient this morning and did speak to his nurse and also one of the social workers [place] and I also spoke to the primary attending Dr Jerral Ralph  I am the patient's primary care provider I also see him at the homeless shelter [facility] ministry at our medical clinic there  The patient's baseline oxygenation is about 92 to 93% on room air with ambulation The patient does have a BiPAP machine which she uses at night in the shelter  Today I find him to be still atelectatic at the bases of the lungs and only 500 on his incentive spirometry  If it is possible to keep this patient another 1 to 2 days to tune him up further with pulmonary toilet I suggest albuterol but nebulization every 6 hours, a flutter valve, more frequent when use of the incentive spirometry and sitting him up in the chair is much as possible  If when ambulating with physical therapy his saturations can stay 88-90 or above with ambulation I am comfortable with him coming back to the shelter however we do not have the ability to take any oxygen either nighttime or 24/7 at the shelter due to lack of staffing and regulatory barriers because we have an open dormitory type facility upstairs and occasionally some guest will try to smoke things of this nature  Thank you very much for your excellent care and I will be happy to see him in short-term follow-up when he returns to the shelter and in my primary care office  Also as well I understand he may be an insulin we can manage insulin for this patient  Another issue is that he does now have full Medicaid however he is yet to achieve Social Security disability  We have engaged with a local law firm [employer] to achieve this we will continue to work with him and try to get him Social Security disability so we can get him to an assisted living center  Timberlake WrightMD Cell [phone]

## 2021-07-22 NOTE — Inpatient Diabetes Management (Signed)
Spoke with patient about the insulin pen and instructed him on how to use. Patient followed my directions with teachback and did well with instruction. Patient states that his vision is blurry and he does not have glasses. He seemed to be able to see the numbers on the pen without a problem. Encouraged him to practice giving himself an injection while in the hospital.   Patient does not know where he will be able to live after discharge. He is hoping that he can go back to the [employer] where he has been before. Will continue to follow this patient until his discharge to make sure he can get the medications for diabetes that he needs and has a place to keep the meds safely.   Will continue to monitor blood sugars while in the hospital.  Smith Mince RN BSN CDE Diabetes Coordinator Pager: [phone]  8am-5pm

## 2021-07-22 NOTE — Progress Notes (Signed)
Mobility Specialist Progress Note   07/22/21 1637  Mobility  Activity Refused mobility   Pt deferring mobility and requesting to eat dinner instead, asked for MS to come back later. Will f/u later this evening if time allows.  Frederico Hamman Mobility Specialist MS [facility] #:  [phone] Acute Rehab Office:  [phone]

## 2021-07-23 LAB — GLUCOSE, CAPILLARY
Glucose-Capillary: 201 mg/dL — ABNORMAL HIGH (ref 70–99)
Glucose-Capillary: 179 mg/dL — ABNORMAL HIGH (ref 70–99)
Glucose-Capillary: 160 mg/dL — ABNORMAL HIGH (ref 70–99)
Glucose-Capillary: 164 mg/dL — ABNORMAL HIGH (ref 70–99)

## 2021-07-23 LAB — BASIC METABOLIC PANEL
Anion gap: 11 (ref 5–15)
BUN: 28 mg/dL — ABNORMAL HIGH (ref 6–20)
CO2: 30 mmol/L (ref 22–32)
Calcium: 8.4 mg/dL — ABNORMAL LOW (ref 8.9–10.3)
Chloride: 96 mmol/L — ABNORMAL LOW (ref 98–111)
Creatinine, Ser: 0.92 mg/dL (ref 0.61–1.24)
GFR, Estimated: >60 mL/min (ref >60)
Glucose, Bld: 158 mg/dL — ABNORMAL HIGH (ref 70–99)
Potassium: 3.5 mmol/L (ref 3.5–5.1)
Sodium: 137 mmol/L (ref 135–145)

## 2021-07-23 MED ORDER — POTASSIUM CHLORIDE CRYS ER 20 MEQ PO TBCR
40.0000 meq | EXTENDED_RELEASE_TABLET | Freq: Once | ORAL | Status: AC
  Administered 2021-07-23: 40 meq via ORAL
  Filled 2021-07-23: qty 2

## 2021-07-23 MED ORDER — FUROSEMIDE 40 MG PO TABS
40.0000 mg | ORAL_TABLET | Freq: Two times a day (BID) | ORAL | Status: DC
  Administered 2021-07-23 – 2021-07-26 (×6): 40 mg via ORAL
  Filled 2021-07-23 (×6): qty 1

## 2021-07-23 MED ORDER — POTASSIUM CHLORIDE CRYS ER 20 MEQ PO TBCR
20.0000 meq | EXTENDED_RELEASE_TABLET | Freq: Every day | ORAL | Status: DC
  Administered 2021-07-24 – 2021-07-26 (×3): 20 meq via ORAL
  Filled 2021-07-23 (×3): qty 1

## 2021-07-23 NOTE — Progress Notes (Signed)
Mobility Specialist Progress Note   07/23/21 1500  Orthostatic Lying   BP- Lying 114/77  Orthostatic Sitting  BP- Sitting 105/87  Orthostatic Standing at 0 minutes  BP- Standing at 0 minutes 98/79  Orthostatic Standing at 3 minutes  BP- Standing at 3 minutes 110/87  Mobility  Activity Ambulated with assistance in hallway  Level of Assistance Contact guard assist, steadying assist  Assistive Device Four wheel walker  Distance Ambulated (ft) 48 ft  Activity Response Tolerated fair  $Mobility charge 1 Mobility   Pre Mobility:  HR, BP, 91%% SpO2 During Mobility: HR, BP, 93% SpO2 Post Mobility: 98 HR,  BP, 91% SpO2  Received pt in bed c/o feeling sluggish but VSS, pt still agreeable to mobility. No physical assist needed throughout session but contact guard for safety. Pt fatigued very quickly in LE and back and requesting to walk a short distance. Returned back to bed w/o fault and pt stating to be feeling better. Left call bell by side and left bed alarm on.  Frederico Hamman Mobility Specialist MS [facility] #:  [phone] Acute Rehab Office:  [phone]

## 2021-07-23 NOTE — Progress Notes (Signed)
Physical Therapy Discharge Patient Details Name: Raymond Hood MRN: [id number] DOB: [birth year] Today's Date: 07/23/2021 Time: 5749-3552 PT Time Calculation (min) (ACUTE ONLY): 17 min  Patient discharged from PT services secondary to goals met and no further PT needs identified.  Please see latest therapy progress note for current level of functioning and progress toward goals.    Progress and discharge plan discussed with patient and/or caregiver: Patient/Caregiver agrees with plan  GP     [name] 07/23/2021, 2:43 PM  Henri Guedes M,PT [place] [phone]

## 2021-07-23 NOTE — Progress Notes (Signed)
Physical Therapy Treatment and D/C Patient Details Name: Raymond Hood MRN: [id number] DOB: 07-May-1970 Today's Date: 07/23/2021   History of Present Illness 51 y/o male presented to ED on 07/14/21 for SOB, cough, hypoxia. Found to have pneumonia. Admitted for acute on chronic respiratory failure with hypoxia. PMH: COPD, bipolar 1 disorder, T2DM, HTN, OSA, RA    PT Comments    Pt admitted with above diagnosis. Pt has met goals and is most likely at baseline. Pt likes rollator and wants to use one at home.  Will need a rollator ordered prior to d/c home. Pt no longer needs skilled PT and can be mobilized by Mobility team while in hospital.  Will sign off.   Recommendations for follow up therapy are one component of a multi-disciplinary discharge planning process, led by the attending physician.  Recommendations may be updated based on patient status, additional functional criteria and insurance authorization.  Follow Up Recommendations  No PT follow up Can patient physically be transported by private vehicle: No   Assistance Recommended at Discharge Frequent or constant Supervision/Assistance  Patient can return home with the following A little help with bathing/dressing/bathroom;Assist for transportation   Equipment Recommendations  Rollator (4 wheels)    Recommendations for Other Services       Precautions / Restrictions Precautions Precautions: Fall Precaution Comments: watch O2 Restrictions Weight Bearing Restrictions: No     Mobility  Bed Mobility Overal bed mobility: Modified Independent Bed Mobility: Supine to Sit     Supine to sit: HOB elevated, Modified independent (Device/Increase time) Sit to supine: Modified independent (Device/Increase time)   General bed mobility comments: increased time/effort.    Transfers Overall transfer level: Needs assistance Equipment used: Rollator (4 wheels) Transfers: Sit to/from Stand Sit to Stand: Modified independent  (Device/Increase time)           General transfer comment: no physical assist required    Ambulation/Gait Ambulation/Gait assistance: Modified independent (Device/Increase time) Gait Distance (Feet): 180 Feet Assistive device: Rollator (4 wheels) Gait Pattern/deviations: Step-through pattern, Trunk flexed Gait velocity: decreased Gait velocity interpretation: 1.31 - 2.62 ft/sec, indicative of limited community ambulator   General Gait Details: slow steady gait, distance limited by pt complaints of fatigue   Stairs             Wheelchair Mobility    Modified Rankin (Stroke Patients Only)       Balance Overall balance assessment: Mild deficits observed, not formally tested Sitting-balance support: No upper extremity supported, Feet supported Sitting balance-Leahy Scale: Good     Standing balance support: During functional activity, Bilateral upper extremity supported Standing balance-Leahy Scale: Poor                              Cognition Arousal/Alertness: Awake/alert Behavior During Therapy: Flat affect Overall Cognitive Status: No family/caregiver present to determine baseline cognitive functioning                                 General Comments: hx of schizoaffective/bipolar disorder. flat affect but follows directions consistently. agitated by therapy attempts to mobilize        Exercises      General Comments General comments (skin integrity, edema, etc.): VSS      Pertinent Vitals/Pain Pain Assessment Pain Assessment: No/denies pain    Home Living  Prior Function            PT Goals (current goals can now be found in the care plan section) Acute Rehab PT Goals Patient Stated Goal: to get better Progress towards PT goals: Goals met/education completed, patient discharged from PT    Frequency    Other (Comment)      PT Plan Current plan remains appropriate;Frequency  needs to be updated    Co-evaluation              AM-PAC PT "6 Clicks" Mobility   Outcome Measure  Help needed turning from your back to your side while in a flat bed without using bedrails?: None Help needed moving from lying on your back to sitting on the side of a flat bed without using bedrails?: None Help needed moving to and from a bed to a chair (including a wheelchair)?: None Help needed standing up from a chair using your arms (e.g., wheelchair or bedside chair)?: None Help needed to walk in hospital room?: None Help needed climbing 3-5 steps with a railing? : A Little 6 Click Score: 23    End of Session Equipment Utilized During Treatment: Oxygen Activity Tolerance: Patient tolerated treatment well;Patient limited by fatigue Patient left: in bed;with call bell/phone within reach;with bed alarm set (per pt request) Nurse Communication: Mobility status PT Visit Diagnosis: Other abnormalities of gait and mobility (R26.89);Unsteadiness on feet (R26.81);Muscle weakness (generalized) (M62.81)     Time: 6606-3016 PT Time Calculation (min) (ACUTE ONLY): 17 min  Charges:  $Gait Training: 8-22 mins                     Shiron Whetsel M,PT Acute Rehab Services [phone]    [name] 07/23/2021, 2:41 PM

## 2021-07-23 NOTE — Plan of Care (Signed)
  Problem: Education: Goal: Ability to describe self-care measures that may prevent or decrease complications (Diabetes Survival Skills Education) will improve Outcome: Not Progressing Goal: Individualized Educational Video(s) Outcome: Not Progressing   Problem: Coping: Goal: Ability to adjust to condition or change in health will improve Outcome: Not Progressing   Problem: Fluid Volume: Goal: Ability to maintain a balanced intake and output will improve Outcome: Not Progressing   Problem: Health Behavior/Discharge Planning: Goal: Ability to identify and utilize available resources and services will improve Outcome: Not Progressing Goal: Ability to manage health-related needs will improve Outcome: Not Progressing   Problem: Metabolic: Goal: Ability to maintain appropriate glucose levels will improve Outcome: Not Progressing   Problem: Nutritional: Goal: Maintenance of adequate nutrition will improve Outcome: Not Progressing Goal: Progress toward achieving an optimal weight will improve Outcome: Not Progressing   Problem: Skin Integrity: Goal: Risk for impaired skin integrity will decrease Outcome: Not Progressing   

## 2021-07-23 NOTE — Progress Notes (Signed)
Pt will like to be placed on bipap before bed around 11-12am. RT will come back later to place Pt on bipap.

## 2021-07-23 NOTE — Progress Notes (Signed)
PROGRESS NOTE        PATIENT DETAILS Name: Christophere Hillhouse Age: 51 y.o. Sex: male Date of Birth: 01-06-1971 Admit Date: 07/14/2021 Admitting Physician Orma Flaming, MD APO:LIDCVU, Burnett Harry, MD  Brief Summary: Patient is a 51 y.o.  male with history of COPD, OSA/OHS, HFpEF, HTN, DM-2, schizoaffective/bipolar disorder, homelessness, ongoing tobacco use-presented with several days history of worsening shortness of breath (seen by PCP recently-started on steroids/cefdinir/Lasix dose escalated)-found to have acute hypoxic respiratory failure due to PNA and decompensated heart failure.  See below for further details.   Significant events: 7/5>> admit to [facility]-hypoxia-PNA/HFpEF exacerbation-started on BiPAP 7/6>> liberated off BiPAP-transition to 8 L HFNC.  Significant studies: 7/5>> CXR: Bibasilar opacities 7/5>> CXR: Right basilar atelectasis-?  Right pleural effusion. 7/6>> Echo: EF 13-14%, grade 1 diastolic dysfunction. 7/11>> bilateral lower extremity Dopplers: No DVT  Significant microbiology data: 7/5>> COVID/influenza PCR: Negative 7/5>> respiratory virus panel: Negative 7/5>> blood culture: No growth 7/6>> blood culture: No growth  Procedures:   Consults: None  Subjective: Breathing is better-no complaints.  Mostly on room air but still requiring some minimal oxygen supplementation at times.  Objective: Vitals: Blood pressure 117/79, pulse 97, temperature 99.3 F (37.4 C), temperature source Oral, resp. rate 11, height 5' 8"  (1.727 m), weight (!) 159.8 kg, SpO2 91 %.   Exam: Gen Exam:Alert awake-not in any distress HEENT:atraumatic, normocephalic Chest: B/L clear to auscultation anteriorly CVS:S1S2 regular Abdomen:soft non tender, non distended Extremities:no edema Neurology: Non focal Skin: no rash   Pertinent Labs/Radiology:    Latest Ref Rng & Units 07/16/2021    1:57 AM 07/15/2021    3:16 AM 07/15/2021    1:47 AM  CBC  WBC 4.0 - 10.5  K/uL 13.4  13.1    Hemoglobin 13.0 - 17.0 g/dL 15.0  15.5  17.0   Hematocrit 39.0 - 52.0 % 51.2  53.2  50.0   Platelets 150 - 400 K/uL 232  221      Lab Results  Component Value Date   NA 137 07/23/2021   K 3.5 07/23/2021   CL 96 (L) 07/23/2021   CO2 30 07/23/2021    Assessment/Plan: Acute hypoxic/hypercarbic respiratory failure: Etiology felt to be multifactorial from HFpEF exacerbation-COPD exacerbation/PNA/OSA/OHS.  Overall significantly better-after treatment of underlying etiologies.  Although improved-still requiring some minimal amount of O2 intermittently.  Unfortunately patient is homeless and lives at a shelter-cannot go to the shelter with oxygen-plans are to continue to optimize his treatment regimen-and try to get him off oxygen consistently before we discharge him back to his shelter.  Have asked RN staff to see if we can get him out of bed to chair every day-mobilize him-encourage use of incentive spirometry/flutter valve.  At Dr. Bettina Gavia recommendation (patient's PCP/pulmonologist)-have switch from [facility] to scheduled albuterol every 6 hours.    Sepsis due to PNA: Sepsis physiology resolved-hypoxia improving-has completed antimicrobial therapy.  Cultures negative so far.    HFpEF exacerbation: Significant improvement in vvolume status ( > - 18 L balance, weight down to 352 pounds)-no leg edema today-I will switch him to oral furosemide today and continue to follow his electrolytes/weights/volume status closely.  Fluid restriction in place-but patient is noncompliant-and apparently asks nursing staff for extra fluid/ginger ale/soda's  COPD exacerbation: No rhonchi-overall improved-continue bronchodilators-has completed a course of tapering steroids.  Continue to encourage mobilization/incentive spirometry/flutter valve.  OHS/OSA: He has a NIV at his shelter that he has been using.  Continue BiPAP nightly while in the hospital.    Acute bilateral conjunctivitis: Treated with  erythromycin ointment.  DM-2.  (A1c 8.3 on 5/23) with uncontrolled hyperglycemia due to steroids: CBGs better since being off steroids from 7/12.  I had some hesitation in continuing insulin long-term but after I spoke with Dr. Morley Kos at the homeless shelter will be able to help him with insulin therefore-we can plan on continuing Semglee 35 units, NovoLog 12 units with meals along with Farxiga/metformin and Tradjenta.  I have asked nursing staff to see if we can provide insulin administration education/diabetic education.    Recent Labs    07/22/21 2043 07/23/21 0820 07/23/21 1149  GLUCAP 216* 201* 179*     HLD: Continue statin  Schizoaffective disorder-bipolar type/anxiety/depression: Continue risperidone, Cymbalta-noted hypercarbia resolved-resume low-dose Neurontin.  Tobacco abuse: Continue transdermal nicotine  Debility/deconditioning: Continue to mobilize with PT/OT.  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   Weight as of this encounter: 159.8 kg.   Code status:   Code Status: Full Code   DVT Prophylaxis: Prophylactic Lovenox   Family Communication: None at bedside (per patient he has no family members)   Disposition Plan: Status is: Inpatient Remains inpatient appropriate because: Improving hypoxia/hypercarbia due to CHF/PNA-not yet stable for discharge.  Needs a few more days for optimization-homeless shelter cannot take him back on oxygen-hopefully we can titrate him off oxygen consistently over the next few days.   Planned Discharge Destination: Back to homeless shelter likely 7/17 or 7/18 once medically optimized.  Diet: Diet Order             Diet heart healthy/carb modified Room service appropriate? Yes; Fluid consistency: Thin; Fluid restriction: 1200 mL Fluid  Diet effective now                      Antimicrobial agents: Anti-infectives (From admission, onward)    Start     Dose/Rate Route Frequency Ordered Stop   07/15/21 1545  cefTRIAXone (ROCEPHIN) 2 g in sodium chloride 0.9 % 100 mL IVPB  Status:  Discontinued        2 g 200 mL/hr over 30 Minutes Intravenous Every 24 hours 07/14/21 1841 07/19/21 1141   07/14/21 2200  doxycycline (VIBRA-TABS) tablet 100 mg  Status:  Discontinued        100 mg Oral Every 12 hours 07/14/21 1920 07/19/21 1141   07/14/21 1545  cefTRIAXone (ROCEPHIN) 2 g in sodium chloride 0.9 % 100 mL IVPB        2 g 200 mL/hr over 30 Minutes Intravenous  Once 07/14/21 1540 07/14/21 1702   07/14/21 1545  azithromycin (ZITHROMAX) 500 mg in sodium chloride 0.9 % 250 mL IVPB  Status:  Discontinued        500 mg 250 mL/hr over 60 Minutes Intravenous Every 24 hours 07/14/21 1540 07/14/21 1920        MEDICATIONS: Scheduled Meds:  albuterol  2.5 mg Nebulization Q6H   atorvastatin  10 mg Oral Daily   budesonide (PULMICORT) nebulizer solution  0.25 mg Nebulization BID   dapagliflozin propanediol  10 mg Oral Daily   DULoxetine  60 mg Oral Daily   enoxaparin (LOVENOX) injection  80 mg Subcutaneous Q24H   famotidine  20  mg Oral BID   fluticasone  2 spray Each Nare Daily   furosemide  40 mg Intravenous BID   gabapentin  300 mg Oral BID   guaiFENesin  600 mg Oral BID   insulin aspart  0-20 Units Subcutaneous TID WC   insulin aspart  0-5 Units Subcutaneous QHS   insulin aspart  12 Units Subcutaneous TID WC   insulin glargine-yfgn  35 Units Subcutaneous Daily   insulin starter kit- pen needles  1 kit Other Once   linagliptin  5 mg Oral Daily   metFORMIN  1,000 mg Oral BID WC   nicotine  21 mg Transdermal Daily   mouth rinse  15 mL Mouth Rinse 4 times per day   polyethylene glycol  17 g Oral Daily   [START ON 07/24/2021] potassium chloride  20 mEq Oral Daily   revefenacin  175 mcg Nebulization Daily   risperidone  4 mg Oral Daily   sodium chloride flush   3 mL Intravenous Q12H   tamsulosin  0.4 mg Oral Daily   Continuous Infusions:  sodium chloride     PRN Meds:.sodium chloride, acetaminophen **OR** acetaminophen, albuterol, mouth rinse, sodium chloride flush   I have personally reviewed following labs and imaging studies  LABORATORY DATA: CBC: No results for input(s): "WBC", "NEUTROABS", "HGB", "HCT", "MCV", "PLT" in the last 168 hours.   Basic Metabolic Panel: Recent Labs  Lab 07/19/21 0805 07/20/21 0230 07/21/21 0246 07/22/21 0211 07/23/21 0234  NA 137 137 138 137 137  K 3.8 3.7 3.5 3.1* 3.5  CL 89* 91* 94* 95* 96*  CO2 31 32 33* 33* 30  GLUCOSE 173* 202* 165* 160* 158*  BUN 31* 34* 31* 27* 28*  CREATININE 0.96 1.02 0.91 0.91 0.92  CALCIUM 9.1 8.7* 8.7* 8.7* 8.4*     GFR: Estimated Creatinine Clearance: 145.8 mL/min (by C-G formula based on SCr of 0.92 mg/dL).  Liver Function Tests: No results for input(s): "AST", "ALT", "ALKPHOS", "BILITOT", "PROT", "ALBUMIN" in the last 168 hours.  No results for input(s): "LIPASE", "AMYLASE" in the last 168 hours. No results for input(s): "AMMONIA" in the last 168 hours.  Coagulation Profile: No results for input(s): "INR", "PROTIME" in the last 168 hours.  Cardiac Enzymes: No results for input(s): "CKTOTAL", "CKMB", "CKMBINDEX", "TROPONINI" in the last 168 hours.  BNP (last 3 results) No results for input(s): "PROBNP" in the last 8760 hours.  Lipid Profile: No results for input(s): "CHOL", "HDL", "LDLCALC", "TRIG", "CHOLHDL", "LDLDIRECT" in the last 72 hours.  Thyroid Function Tests: No results for input(s): "TSH", "T4TOTAL", "FREET4", "T3FREE", "THYROIDAB" in the last 72 hours.  Anemia Panel: No results for input(s): "VITAMINB12", "FOLATE", "FERRITIN", "TIBC", "IRON", "RETICCTPCT" in the last 72 hours.  Urine analysis:    Component Value Date/Time   COLORURINE YELLOW (A) 11/16/2020 2238   APPEARANCEUR HAZY (A) 11/16/2020 2238   LABSPEC >1.046 (H) 11/16/2020  2238   PHURINE 5.0 11/16/2020 2238   GLUCOSEU NEGATIVE 11/16/2020 2238   HGBUR NEGATIVE 11/16/2020 2238   [place] NEGATIVE 11/16/2020 2238   [place] 11/16/2020 2238   PROTEINUR NEGATIVE 11/16/2020 2238   NITRITE NEGATIVE 11/16/2020 2238   LEUKOCYTESUR NEGATIVE 11/16/2020 2238    Sepsis Labs: Lactic Acid, Venous    Component Value Date/Time   LATICACIDVEN 1.6 07/14/2021 1724    MICROBIOLOGY: Recent Results (from the past 240 hour(s))  Resp Panel by RT-PCR (Flu A&B, Covid) Anterior Nasal Swab     Status: None   Collection Time: 07/14/21  3:11 PM   Specimen: Anterior Nasal Swab  Result Value Ref Range Status   SARS Coronavirus 2 by RT PCR NEGATIVE NEGATIVE Final    Comment: (NOTE) SARS-CoV-2 target nucleic acids are NOT DETECTED.  The SARS-CoV-2 RNA is generally detectable in upper respiratory specimens during the acute phase of infection. The lowest concentration of SARS-CoV-2 viral copies this assay can detect is 138 copies/mL. A negative result does not preclude SARS-Cov-2 infection and should not be used as the sole basis for treatment or other patient management decisions. A negative result may occur with  improper specimen collection/handling, submission of specimen other than nasopharyngeal swab, presence of viral mutation(s) within the areas targeted by this assay, and inadequate number of viral copies(<138 copies/mL). A negative result must be combined with clinical observations, patient history, and epidemiological information. The expected result is Negative.  Fact Sheet for Patients:  EntrepreneurPulse.com.au  Fact Sheet for Healthcare Providers:  IncredibleEmployment.be  This test is no t yet approved or cleared by the [place] FDA and  has been authorized for detection and/or diagnosis of SARS-CoV-2 by FDA under an Emergency Use Authorization (EUA). This EUA will remain  in effect (meaning this test  can be used) for the duration of the COVID-19 declaration under Section 564(b)(1) of the Act, 21 U.S.C.section 360bbb-3(b)(1), unless the authorization is terminated  or revoked sooner.       Influenza A by PCR NEGATIVE NEGATIVE Final   Influenza B by PCR NEGATIVE NEGATIVE Final    Comment: (NOTE) The Xpert Xpress SARS-CoV-2/FLU/RSV plus assay is intended as an aid in the diagnosis of influenza from Nasopharyngeal swab specimens and should not be used as a sole basis for treatment. Nasal washings and aspirates are unacceptable for Xpert Xpress SARS-CoV-2/FLU/RSV testing.  Fact Sheet for Patients: EntrepreneurPulse.com.au  Fact Sheet for Healthcare Providers: IncredibleEmployment.be  This test is not yet approved or cleared by the [place] FDA and has been authorized for detection and/or diagnosis of SARS-CoV-2 by FDA under an Emergency Use Authorization (EUA). This EUA will remain in effect (meaning this test can be used) for the duration of the COVID-19 declaration under Section 564(b)(1) of the Act, 21 U.S.C. section 360bbb-3(b)(1), unless the authorization is terminated or revoked.  Performed at [facility] [address] [place], [place] [address]   Respiratory (~20 pathogens) panel by PCR     Status: None   Collection Time: 07/14/21  3:11 PM   Specimen: Nasopharyngeal Swab; Respiratory  Result Value Ref Range Status   Adenovirus NOT DETECTED NOT DETECTED Final   Coronavirus 229E NOT DETECTED NOT DETECTED Final    Comment: (NOTE) The Coronavirus on the Respiratory Panel, DOES NOT test for the novel  Coronavirus (2019 nCoV)    Coronavirus HKU1 NOT DETECTED NOT DETECTED Final   Coronavirus NL63 NOT DETECTED NOT DETECTED Final   Coronavirus OC43 NOT DETECTED NOT DETECTED Final   Metapneumovirus NOT DETECTED NOT DETECTED Final   Rhinovirus / Enterovirus NOT DETECTED NOT DETECTED Final   Influenza A NOT DETECTED NOT  DETECTED Final   Influenza B NOT DETECTED NOT DETECTED Final   Parainfluenza Virus 1 NOT DETECTED NOT DETECTED Final   Parainfluenza Virus 2 NOT DETECTED NOT DETECTED Final   Parainfluenza Virus 3 NOT DETECTED NOT DETECTED Final   Parainfluenza Virus 4 NOT DETECTED NOT DETECTED Final   Respiratory Syncytial Virus NOT DETECTED NOT DETECTED Final   Bordetella pertussis NOT DETECTED NOT DETECTED Final   Bordetella Parapertussis NOT DETECTED  NOT DETECTED Final   Chlamydophila pneumoniae NOT DETECTED NOT DETECTED Final   Mycoplasma pneumoniae NOT DETECTED NOT DETECTED Final    Comment: Performed at [facility], [place] [address] [place], [place] [address]  Culture, blood (routine x 2)     Status: None   Collection Time: 07/14/21  4:50 PM   Specimen: BLOOD LEFT HAND  Result Value Ref Range Status   Specimen Description BLOOD LEFT HAND  Final   Special Requests   Final    BOTTLES DRAWN AEROBIC AND ANAEROBIC Blood Culture adequate volume   Culture   Final    NO GROWTH 5 DAYS Performed at [facility], [place] [address] [place], [place] [address]    Report Status 07/19/2021 FINAL  Final  Culture, blood (routine x 2)     Status: None   Collection Time: 07/15/21  5:58 AM   Specimen: BLOOD  Result Value Ref Range Status   Specimen Description BLOOD RIGHT ANTECUBITAL  Final   Special Requests   Final    BOTTLES DRAWN AEROBIC AND ANAEROBIC Blood Culture adequate volume   Culture   Final    NO GROWTH 5 DAYS Performed at [facility], [place] [address], [place], [place] [address]    Report Status 07/20/2021 FINAL  Final    RADIOLOGY STUDIES/RESULTS: No results found.   LOS: 9 days   Oren Binet, MD  [facility]    To contact the attending provider between 7A-7P or the covering provider during after hours 7P-7A, please log into the web site [email or web address] and access using universal [place] password for that web site. If you do not have the password, please  call the hospital operator.  07/23/2021, 12:58 PM

## 2021-07-24 LAB — BASIC METABOLIC PANEL
Anion gap: 11 (ref 5–15)
BUN: 23 mg/dL — ABNORMAL HIGH (ref 6–20)
CO2: 30 mmol/L (ref 22–32)
Calcium: 8.6 mg/dL — ABNORMAL LOW (ref 8.9–10.3)
Chloride: 97 mmol/L — ABNORMAL LOW (ref 98–111)
Creatinine, Ser: 0.72 mg/dL (ref 0.61–1.24)
GFR, Estimated: >60 mL/min (ref >60)
Glucose, Bld: 142 mg/dL — ABNORMAL HIGH (ref 70–99)
Potassium: 3.8 mmol/L (ref 3.5–5.1)
Sodium: 138 mmol/L (ref 135–145)

## 2021-07-24 LAB — GLUCOSE, CAPILLARY
Glucose-Capillary: 173 mg/dL — ABNORMAL HIGH (ref 70–99)
Glucose-Capillary: 174 mg/dL — ABNORMAL HIGH (ref 70–99)
Glucose-Capillary: 167 mg/dL — ABNORMAL HIGH (ref 70–99)
Glucose-Capillary: 134 mg/dL — ABNORMAL HIGH (ref 70–99)

## 2021-07-24 MED ORDER — OXYCODONE-ACETAMINOPHEN 5-325 MG PO TABS
1.0000 | ORAL_TABLET | Freq: Three times a day (TID) | ORAL | Status: DC | PRN
  Administered 2021-07-24 – 2021-07-26 (×5): 1 via ORAL
  Filled 2021-07-24 (×5): qty 1

## 2021-07-24 NOTE — Progress Notes (Signed)
Pt placed on BiPAP for bedtime. RT will cont to monitor as needed.

## 2021-07-24 NOTE — Plan of Care (Signed)
  Problem: Education: Goal: Ability to describe self-care measures that may prevent or decrease complications (Diabetes Survival Skills Education) will improve Outcome: Not Progressing Goal: Individualized Educational Video(s) Outcome: Not Progressing   Problem: Coping: Goal: Ability to adjust to condition or change in health will improve Outcome: Not Progressing   Problem: Fluid Volume: Goal: Ability to maintain a balanced intake and output will improve Outcome: Not Progressing   Problem: Health Behavior/Discharge Planning: Goal: Ability to identify and utilize available resources and services will improve Outcome: Not Progressing Goal: Ability to manage health-related needs will improve Outcome: Not Progressing   Problem: Skin Integrity: Goal: Risk for impaired skin integrity will decrease Outcome: Not Progressing   Problem: Tissue Perfusion: Goal: Adequacy of tissue perfusion will improve Outcome: Not Progressing

## 2021-07-24 NOTE — Hospital Course (Addendum)
51 year old male with multiple medical problems including COPD/OSA/OHS, HF PEF, HTN, T2DM, schizoaffective/BPD/homelessness, ongoing tobacco use presented with several days history of worsening shortness of breath (seen by PCP recently-started on steroids/cefdinir/Lasix dose escalated)-found to have acute hypoxic respiratory failure due to PNA and decompensated heart failure.  Patient was admitted and treated for pneumonia, CHF exacerbation, hypoxia, hypercapnia initially needing BiPAP> then to HFNC.  Blood culture 7/5/6 no growth, respiratory virus panel COVID-19 negative, bilateral lower extremity no DVT on 7/11 echo with EF 55 to 60%, G1 DD.

## 2021-07-24 NOTE — Progress Notes (Signed)
PROGRESS NOTE Raymond Hood  NWG:[id number] DOB: 1970/06/12 DOA: 07/14/2021 PCP: [name], MD   Brief Narrative/Hospital Course: 51 year old male with multiple medical problems including COPD/OSA/OHS, HF PEF, HTN, T2DM, schizoaffective/BPD/homelessness, ongoing tobacco use presented with several days history of worsening shortness of breath (seen by PCP recently-started on steroids/cefdinir/Lasix dose escalated)-found to have acute hypoxic respiratory failure due to PNA and decompensated heart failure.  Patient was admitted and treated for pneumonia, CHF exacerbation, hypoxia, hypercapnia initially needing BiPAP> then to HFNC.  Blood culture 7/5/6 no growth, respiratory virus panel COVID-19 negative, bilateral lower extremity no DVT on 7/11 echo with EF 55 to 60%, G1 DD.    Subjective: Seen this am. C/o shortness of breath, some cough,on RA now Does not feel ready to go to shelter today   Assessment and Plan: Principal Problem:   Acute on chronic respiratory failure with hypoxia (HCC) Active Problems:   Sepsis from pneumonia    Acute on chronic diastolic CHF (congestive heart failure) (HCC)   COPD with chronic bronchitis (HCC)   Conjunctivitis   Prolonged QT interval   Type 2 diabetes mellitus with hyperglycemia (HCC)   Essential hypertension   GERD (gastroesophageal reflux disease)   Schizo affective schizophrenia ([place])   Anxiety and depression   OSA/OHS   Tobacco abuse   Homeless   CAP (community acquired pneumonia)   Acute on chronic respiratory failure with hypoxia  CAP Sepsis from pneumonia  Acute COPD exacerbation with chronic bronchitis: Shortness of breath/hypoxic hypercapnic respiratory failure multifactorial due to sepsis due to pneumonia, COPD and CHF exacerbation.  Continue to wean off oxygen so that he can return to homeless shelter.  Completed antibiotic.  Continue with scheduled albuterol every 6 hours along with other home bronchodilators.  Continue steroid  taper, incentive spirometry flutter valve, antitussives. He is able to come off oxygen this am.  Acute on chronic diastolic CHF: Patient has been diuresed with IV Lasix transition to p.o., monitor electrolytes volume status monitor closely along with intake output.  Continue fluid restriction diet, focusing on education as he is noncompliant. cont [place] Continue Net IO Since Admission: -19,065 mL [07/24/21 0819]    OSA/OHS: Uses NIV at the shelter  Conjunctivitis bilateral, treated with erythromycin ointment  T2DM with uncontrolled hyperglycemia: Continue metformin and Tradjenta, Premeal insulin 12 units 3 times daily and Semglee 35 units daily Recent Labs  Lab 07/23/21 0820 07/23/21 1149 07/23/21 1614 07/23/21 2120 07/24/21 0815  GLUCAP 201* 179* 160* 164* 173*    Essential hypertension: Well-controlled on Lasix HLD on statin GERD: Continue Pepcid  Homeless Schizo affective schizophrenia Anxiety and depression: Mood stable continue Risperdal, Neurontin Cymbalta  Tobacco abuse-cessation advised continue nicotine patch Debility deconditioning encourage ambulation PT OT  Morbid Obesity:Patient's Body mass index is 53.7 kg/m. : Will benefit with PCP follow-up, weight loss  healthy lifestyle and cont home NIV   DVT prophylaxis:  Code Status:   Code Status: Full Code Family Communication: plan of care discussed with patient at bedside. Patient status is: INPATIENT  because of ONGOING shortness of breath and wheezing Level of care: Progressive   Dispo: The patient is from: homeless shelter            Anticipated disposition: homeless shelter in 24-48 hrs if improves further and stays off oxygen  Mobility Assessment (last 72 hours)     Mobility Assessment     Row Name 07/23/21 2030 07/23/21 1400 07/22/21 2230 07/21/21 1328     Does patient have an  order for bedrest or is patient medically unstable No - Continue assessment (P)  -- No - Continue assessment --     What is the highest level of mobility based on the progressive mobility assessment? -- Level 5 (Walks with assist in room/hall) - Balance while stepping forward/back and can walk in room with assist - Complete Level 5 (Walks with assist in room/hall) - Balance while stepping forward/back and can walk in room with assist - Complete Level 5 (Walks with assist in room/hall) - Balance while stepping forward/back and can walk in room with assist - Complete    Is the above level different from baseline mobility prior to current illness? -- -- Yes - Recommend PT order --              Objective: Vitals last 24 hrs: Vitals:   07/23/21 2306 07/24/21 0212 07/24/21 0425 07/24/21 0732  BP:   (!) 123/93   Pulse: 91     Resp: 18     Temp:   98.6 F (37 C) 98 F (36.7 C)  TempSrc:   Oral Oral  SpO2: 99% 100%    Weight:   (!) 160.2 kg   Height:       Weight change: 0.443 kg  Physical Examination: General exam: alert awake, obese, older than stated age, weak appearing. HEENT:Oral mucosa moist, Ear/Nose WNL grossly, dentition normal. Respiratory system: bilaterally diminished with mildwheezing,no use of accessory muscle Cardiovascular system: S1 & S2 +, No JVD. Gastrointestinal system: Abdomen soft,NT,ND, BS+ Nervous System:Alert, awake, moving extremities and grossly nonfocal Extremities: LE edema  chronic appearing mild,distal peripheral pulses palpable.  Skin: No rashes,no icterus. MSK: Normal muscle bulk,tone, power  Medications reviewed:  Scheduled Meds:  albuterol  2.5 mg Nebulization Q6H   atorvastatin  10 mg Oral Daily   budesonide (PULMICORT) nebulizer solution  0.25 mg Nebulization BID   dapagliflozin propanediol  10 mg Oral Daily   DULoxetine  60 mg Oral Daily   enoxaparin (LOVENOX) injection  80 mg Subcutaneous Q24H   famotidine  20 mg Oral BID   fluticasone  2 spray Each Nare Daily   furosemide  40 mg Oral BID   gabapentin  300 mg Oral BID   guaiFENesin  600 mg Oral BID    insulin aspart  0-20 Units Subcutaneous TID WC   insulin aspart  0-5 Units Subcutaneous QHS   insulin aspart  12 Units Subcutaneous TID WC   insulin glargine-yfgn  35 Units Subcutaneous Daily   insulin starter kit- pen needles  1 kit Other Once   linagliptin  5 mg Oral Daily   metFORMIN  1,000 mg Oral BID WC   nicotine  21 mg Transdermal Daily   mouth rinse  15 mL Mouth Rinse 4 times per day   polyethylene glycol  17 g Oral Daily   potassium chloride  20 mEq Oral Daily   revefenacin  175 mcg Nebulization Daily   risperidone  4 mg Oral Daily   sodium chloride flush  3 mL Intravenous Q12H   tamsulosin  0.4 mg Oral Daily   Continuous Infusions:  sodium chloride        Diet Order             Diet heart healthy/carb modified Room service appropriate? Yes; Fluid consistency: Thin; Fluid restriction: 1200 mL Fluid  Diet effective now  Intake/Output Summary (Last 24 hours) at 07/24/2021 1009 Last data filed at 07/24/2021 0426 Gross per 24 hour  Intake 1480 ml  Output 1852 ml  Net -372 ml   Net IO Since Admission: -19,065 mL [07/24/21 1009]  Wt Readings from Last 3 Encounters:  07/24/21 (!) 160.2 kg  07/14/21 (!) 175.6 kg  07/14/21 (!) 174.3 kg     [employer] (From admission, onward)    None     Data Reviewed: I have personally reviewed following labs and imaging studies CBC: No results for input(s): "WBC", "NEUTROABS", "HGB", "HCT", "MCV", "PLT" in the last 168 hours. Basic Metabolic Panel: Recent Labs  Lab 07/20/21 0230 07/21/21 0246 07/22/21 0211 07/23/21 0234 07/24/21 0201  NA 137 138 137 137 138  K 3.7 3.5 3.1* 3.5 3.8  CL 91* 94* 95* 96* 97*  CO2 32 33* 33* 30 30  GLUCOSE 202* 165* 160* 158* 142*  BUN 34* 31* 27* 28* 23*  CREATININE 1.02 0.91 0.91 0.92 0.72  CALCIUM 8.7* 8.7* 8.7* 8.4* 8.6*   GFR: Estimated Creatinine Clearance: 167.9 mL/min (by C-G formula based on SCr of 0.72 mg/dL). Liver Function Tests: No  results for input(s): "AST", "ALT", "ALKPHOS", "BILITOT", "PROT", "ALBUMIN" in the last 168 hours. No results for input(s): "LIPASE", "AMYLASE" in the last 168 hours. No results for input(s): "AMMONIA" in the last 168 hours. Coagulation Profile: No results for input(s): "INR", "PROTIME" in the last 168 hours. BNP (last 3 results) No results for input(s): "PROBNP" in the last 8760 hours. HbA1C: No results for input(s): "HGBA1C" in the last 72 hours. CBG: Recent Labs  Lab 07/23/21 0820 07/23/21 1149 07/23/21 1614 07/23/21 2120 07/24/21 0815  GLUCAP 201* 179* 160* 164* 173*   Lipid Profile: No results for input(s): "CHOL", "HDL", "LDLCALC", "TRIG", "CHOLHDL", "LDLDIRECT" in the last 72 hours. Thyroid Function Tests: No results for input(s): "TSH", "T4TOTAL", "FREET4", "T3FREE", "THYROIDAB" in the last 72 hours. Sepsis Labs: No results for input(s): "PROCALCITON", "LATICACIDVEN" in the last 168 hours.  Recent Results (from the past 240 hour(s))  Resp Panel by RT-PCR (Flu A&B, Covid) Anterior Nasal Swab     Status: None   Collection Time: 07/14/21  3:11 PM   Specimen: Anterior Nasal Swab  Result Value Ref Range Status   SARS Coronavirus 2 by RT PCR NEGATIVE NEGATIVE Final    Comment: (NOTE) SARS-CoV-2 target nucleic acids are NOT DETECTED.  The SARS-CoV-2 RNA is generally detectable in upper respiratory specimens during the acute phase of infection. The lowest concentration of SARS-CoV-2 viral copies this assay can detect is 138 copies/mL. A negative result does not preclude SARS-Cov-2 infection and should not be used as the sole basis for treatment or other patient management decisions. A negative result may occur with  improper specimen collection/handling, submission of specimen other than nasopharyngeal swab, presence of viral mutation(s) within the areas targeted by this assay, and inadequate number of viral copies(<138 copies/mL). A negative result must be combined  with clinical observations, patient history, and epidemiological information. The expected result is Negative.  Fact Sheet for Patients:  EntrepreneurPulse.com.au  Fact Sheet for Healthcare Providers:  IncredibleEmployment.be  This test is no t yet approved or cleared by the [place] FDA and  has been authorized for detection and/or diagnosis of SARS-CoV-2 by FDA under an Emergency Use Authorization (EUA). This EUA will remain  in effect (meaning this test can be used) for the duration of the COVID-19 declaration under Section 564(b)(1) of the Act, 21 U.S.C.section  360bbb-3(b)(1), unless the authorization is terminated  or revoked sooner.       Influenza A by PCR NEGATIVE NEGATIVE Final   Influenza B by PCR NEGATIVE NEGATIVE Final    Comment: (NOTE) The Xpert Xpress SARS-CoV-2/FLU/RSV plus assay is intended as an aid in the diagnosis of influenza from Nasopharyngeal swab specimens and should not be used as a sole basis for treatment. Nasal washings and aspirates are unacceptable for Xpert Xpress SARS-CoV-2/FLU/RSV testing.  Fact Sheet for Patients: EntrepreneurPulse.com.au  Fact Sheet for Healthcare Providers: IncredibleEmployment.be  This test is not yet approved or cleared by the [place] FDA and has been authorized for detection and/or diagnosis of SARS-CoV-2 by FDA under an Emergency Use Authorization (EUA). This EUA will remain in effect (meaning this test can be used) for the duration of the COVID-19 declaration under Section 564(b)(1) of the Act, 21 U.S.C. section 360bbb-3(b)(1), unless the authorization is terminated or revoked.  Performed at [facility], [place] [address], [place], [place] [address]   Respiratory (~20 pathogens) panel by PCR     Status: None   Collection Time: 07/14/21  3:11 PM   Specimen: Nasopharyngeal Swab; Respiratory  Result Value Ref Range Status    Adenovirus NOT DETECTED NOT DETECTED Final   Coronavirus 229E NOT DETECTED NOT DETECTED Final    Comment: (NOTE) The Coronavirus on the Respiratory Panel, DOES NOT test for the novel  Coronavirus (2019 nCoV)    Coronavirus HKU1 NOT DETECTED NOT DETECTED Final   Coronavirus NL63 NOT DETECTED NOT DETECTED Final   Coronavirus OC43 NOT DETECTED NOT DETECTED Final   Metapneumovirus NOT DETECTED NOT DETECTED Final   Rhinovirus / Enterovirus NOT DETECTED NOT DETECTED Final   Influenza A NOT DETECTED NOT DETECTED Final   Influenza B NOT DETECTED NOT DETECTED Final   Parainfluenza Virus 1 NOT DETECTED NOT DETECTED Final   Parainfluenza Virus 2 NOT DETECTED NOT DETECTED Final   Parainfluenza Virus 3 NOT DETECTED NOT DETECTED Final   Parainfluenza Virus 4 NOT DETECTED NOT DETECTED Final   Respiratory Syncytial Virus NOT DETECTED NOT DETECTED Final   Bordetella pertussis NOT DETECTED NOT DETECTED Final   Bordetella Parapertussis NOT DETECTED NOT DETECTED Final   Chlamydophila pneumoniae NOT DETECTED NOT DETECTED Final   Mycoplasma pneumoniae NOT DETECTED NOT DETECTED Final    Comment: Performed at [facility], [place]. [address], [place], [place] [address]  Culture, blood (routine x 2)     Status: None   Collection Time: 07/14/21  4:50 PM   Specimen: BLOOD LEFT HAND  Result Value Ref Range Status   Specimen Description BLOOD LEFT HAND  Final   Special Requests   Final    BOTTLES DRAWN AEROBIC AND ANAEROBIC Blood Culture adequate volume   Culture   Final    NO GROWTH 5 DAYS Performed at [facility], [place] [address] [place], [place] [address]    Report Status 07/19/2021 FINAL  Final  Culture, blood (routine x 2)     Status: None   Collection Time: 07/15/21  5:58 AM   Specimen: BLOOD  Result Value Ref Range Status   Specimen Description BLOOD RIGHT ANTECUBITAL  Final   Special Requests   Final    BOTTLES DRAWN AEROBIC AND ANAEROBIC Blood Culture adequate volume   Culture   Final     NO GROWTH 5 DAYS Performed at [facility], [place] [address], [place], [place] [address]    Report Status 07/20/2021  FINAL  Final    Antimicrobials: Anti-infectives (From admission, onward)    Start     Dose/Rate Route Frequency Ordered Stop   07/15/21 1545  cefTRIAXone (ROCEPHIN) 2 g in sodium chloride 0.9 % 100 mL IVPB  Status:  Discontinued        2 g 200 mL/hr over 30 Minutes Intravenous Every 24 hours 07/14/21 1841 07/19/21 1141   07/14/21 2200  doxycycline (VIBRA-TABS) tablet 100 mg  Status:  Discontinued        100 mg Oral Every 12 hours 07/14/21 1920 07/19/21 1141   07/14/21 1545  cefTRIAXone (ROCEPHIN) 2 g in sodium chloride 0.9 % 100 mL IVPB        2 g 200 mL/hr over 30 Minutes Intravenous  Once 07/14/21 1540 07/14/21 1702   07/14/21 1545  azithromycin (ZITHROMAX) 500 mg in sodium chloride 0.9 % 250 mL IVPB  Status:  Discontinued        500 mg 250 mL/hr over 60 Minutes Intravenous Every 24 hours 07/14/21 1540 07/14/21 1920      Culture/Microbiology    Component Value Date/Time   SDES BLOOD RIGHT ANTECUBITAL 07/15/2021 0558   SPECREQUEST  07/15/2021 0558    BOTTLES DRAWN AEROBIC AND ANAEROBIC Blood Culture adequate volume   CULT  07/15/2021 0558    NO GROWTH 5 DAYS Performed at [facility], [place] [address] [place], [place] [address]    REPTSTATUS 07/20/2021 FINAL 07/15/2021 4503  Radiology Studies: No results found.   LOS: 10 days   Antonieta Pert, MD [facility]  07/24/2021, 10:09 AM

## 2021-07-25 LAB — GLUCOSE, CAPILLARY
Glucose-Capillary: 164 mg/dL — ABNORMAL HIGH (ref 70–99)
Glucose-Capillary: 157 mg/dL — ABNORMAL HIGH (ref 70–99)
Glucose-Capillary: 121 mg/dL — ABNORMAL HIGH (ref 70–99)

## 2021-07-25 NOTE — Discharge Summary (Signed)
Physician Discharge Summary  Raymond Hood WYO:[id number] DOB: 07/05/1970 DOA: 07/14/2021  PCP: [name], MD  Admit date: 07/14/2021 Discharge date: 07/26/2021 Recommendations for Outpatient Follow-up:  Follow up with PCP in 1 weeks-call for appointment Please obtain BMP/CBC in one week  Discharge Dispo: homeless sshelter Discharge Condition: Stable Code Status:   Code Status: Full Code Diet recommendation:  Diet Order             Diet heart healthy/carb modified Room service appropriate? Yes; Fluid consistency: Thin; Fluid restriction: 1200 mL Fluid  Diet effective now                 Brief/Interim Summary: 51 year old male with multiple medical problems including COPD/OSA/OHS, HF PEF, HTN, T2DM, schizoaffective/BPD/homelessness, ongoing tobacco use presented with several days history of worsening shortness of breath (seen by PCP recently-started on steroids/cefdinir/Lasix dose escalated)-found to have acute hypoxic respiratory failure due to PNA and decompensated heart failure.  Patient was admitted and treated for pneumonia, CHF exacerbation, hypoxia, hypercapnia initially needing BiPAP> then to HFNC.  Blood culture 7/5/6 no growth, respiratory virus panel COVID-19 negative, bilateral lower extremity no DVT on 7/11 echo with EF 55 to 60%, G1 DD.  Patient managed antibiotic steroids diuretics.  This time respiratory status improved he is now on room air. He is doing overall well he is medically stable for discharge back to homeless/shelter as discussed with his PCP last week.  Discharge Diagnoses:  Principal Problem:   Acute on chronic respiratory failure with hypoxia (HCC) Active Problems:   Sepsis from pneumonia    Acute on chronic diastolic CHF (congestive heart failure) (HCC)   COPD with chronic bronchitis (HCC)   Conjunctivitis   Prolonged QT interval   Type 2 diabetes mellitus with hyperglycemia (HCC)   Essential hypertension   GERD (gastroesophageal reflux disease)    Schizo affective schizophrenia (HCC)   Anxiety and depression   OSA/OHS   Tobacco abuse   Homeless   CAP (community acquired pneumonia)  Acute on chronic respiratory failure with hypoxia  CAP Sepsis from pneumonia  Acute COPD exacerbation with chronic bronchitis: Shortness of breath/hypoxic hypercapnic respiratory failure multifactorial due to sepsis due to pneumonia, COPD and CHF exacerbation.Able to wean off oxygen,completed antibiotics/steroid. Continue with scheduled albuterol every 6 hours along with other home bronchodilators,incentive spirometry flutter valve, antitussives.   Acute on chronic diastolic CHF: Patient has been diuresed with IV Lasix transitioned to p.o., monitor electrolytes volume status monitor closely along with intake output.  Continue fluid restriction diet, focusing on education as he is noncompliant. cont [place] [employer] Since Admission: -24,295 mL [07/26/21 1401]  OSA/OHS:Uses home cpap- cont same there   Conjunctivitis bilateral, treated with erythromycin ointment.  Improved   T2DM with uncontrolled hyperglycemia: Currently well controlled  on metformin/Tradjenta, Premeal insulin 12 units TID and Semglee 35 u. Insulin education done and he is comfortable doing inj and knew abt signs and symptoms of hypoglycemia and hyperglycemia.  Insulin pne is provided  for him to return to shelter Recent Labs  Lab 07/25/21 1242 07/25/21 1649 07/25/21 2053 07/26/21 0550 07/26/21 1148  GLUCAP 164* 157* 121* 163* 137*    Essential hypertension: Well-controlled on Lasix HLD on statin GERD: Continue Pepcid   Homeless Schizo affective schizophrenia Anxiety and depression: Mood stable continue Risperdal, Neurontin Cymbalta   Tobacco abuse-cessation advised continue nicotine patch Debility deconditioning encourage ambulation PT OT   Morbid Obesity:Patient's Body mass index is 53.7 kg/m. : Will benefit with PCP follow-up, weight loss  healthy lifestyle and  cont home NIV  I communicated with Dr. Saralyn Pilar Wright's PCP to follow-up on our recommendation and monitor his sugar at facility.  Consults: TOC  Subjective: Alert awake oriented resting comfortably.No new complaints.He learned how to do diabetes Accu-Chek as well as insulin injection  Discharge Exam: Vitals:   07/26/21 0511 07/26/21 0813  BP: (!) 123/91 135/80  Pulse: 96 96  Resp:  20  Temp: 97.7 F (36.5 C) (!) 97.4 F (36.3 C)  SpO2: 93% 94%   General: Pt is alert, awake, not in acute distress Cardiovascular: RRR, S1/S2 +, no rubs, no gallops Respiratory: CTA bilaterally, no wheezing, no rhonchi Abdominal: Soft, NT, ND, bowel sounds + Extremities: no edema, no cyanosis.  Discharge Instructions  Discharge Instructions     Discharge instructions   Complete by: As directed    Follow-up with PCP 1 week  Please call call MD or return to ER for similar or worsening recurring problem that brought you to hospital or if any fever,nausea/vomiting,abdominal pain, uncontrolled pain, chest pain,  shortness of breath or any other alarming symptoms.  Please follow-up your doctor as instructed in a week time and call the office for appointment.  Please avoid alcohol, smoking, or any other illicit substance and maintain healthy habits including taking your regular medications as prescribed.  You were cared for by a hospitalist during your hospital stay. If you have any questions about your discharge medications or the care you received while you were in the hospital after you are discharged, you can call the unit and ask to speak with the hospitalist on call if the hospitalist that took care of you is not available.  Once you are discharged, your primary care physician will handle any further medical issues. Please note that NO REFILLS for any discharge medications will be authorized once you are discharged, as it is imperative that you return to your primary care physician (or establish  a relationship with a primary care physician if you do not have one) for your aftercare needs so that they can reassess your need for medications and monitor your lab values   Check blood sugar 3 times a day and bedtime at home. If blood sugar running above 200 less than 70 please call your MD to adjust insulin. If blood sugars running less 100 do not use insulin and call MD. If you noticed signs and symptoms of hypoglycemia or low blood sugar like jitteriness, confusion, thirst, tremor, sweating- Check blood sugar, drink sugary drink/biscuits/sweets to increase sugar level and call MD or return to ER.   Increase activity slowly   Complete by: As directed       Allergies as of 07/26/2021   No Known Allergies      Medication List     STOP taking these medications    levofloxacin 500 MG tablet Commonly known as: LEVAQUIN       TAKE these medications    albuterol 108 (90 Base) MCG/ACT inhaler Commonly known as: VENTOLIN HFA Inhale 2 puffs into the lungs every 6  hours as needed for wheezing or shortness of breath.   atorvastatin 10 MG tablet Commonly known as: LIPITOR Take 1 tablet (10 mg total) by mouth once daily.   blood glucose meter kit and supplies Kit Use up to four times daily as directed.   ciprofloxacin 0.3 % ophthalmic solution Commonly known as: Ciloxan Place 1 drop into the right eye every 4 (four) hours while awake.   dapagliflozin propanediol  10 MG Tabs tablet Commonly known as: FARXIGA Take 1 tablet (10 mg total) by mouth daily. What changed: Another medication with the same name was removed. Continue taking this medication, and follow the directions you see here.   DULoxetine 60 MG capsule Commonly known as: CYMBALTA Take 1 capsule (60 mg total) by mouth daily.   famotidine 20 MG tablet Commonly known as: PEPCID Take 1 tablet (20 mg total) by mouth 2 (two) times daily.   Fingerstix Lancets Misc Use as directed up to 4 times daily.   fluticasone  50 MCG/ACT nasal spray Commonly known as: FLONASE Place 2 sprays into both nostrils daily.   furosemide 40 MG tablet Commonly known as: LASIX Take 1 tablet (40 mg total) by mouth 2 (two) times daily. What changed: additional instructions   gabapentin 300 MG capsule Commonly known as: NEURONTIN Take 2 capsules in the morning AND 1 capsule midday AND 2 capsules every evening.   glucose blood test strip Use four times daily as directed.   hydrOXYzine 25 MG tablet Commonly known as: ATARAX Take 1 tablet (25 mg total) by mouth 3 (three) times daily as needed.   insulin aspart 100 UNIT/ML FlexPen Commonly known as: NOVOLOG Inject 12 Units into the skin 3 (three) times daily with meals.   insulin glargine 100 UNIT/ML Solostar Pen Commonly known as: LANTUS Inject 35 Units into the skin daily.   Insulin Pen Needle 32G X 4 MM Misc Use with insulin pens.   linagliptin 5 MG Tabs tablet Commonly known as: TRADJENTA Take 1 tablet (5 mg total) by mouth daily. Start taking on: July 27, 2021   metFORMIN 500 MG tablet Commonly known as: GLUCOPHAGE Take 2 tablets (1,000 mg total) by mouth 2 (two) times daily with a meal.   olopatadine 0.1 % ophthalmic solution Commonly known as: Patanol Place 1 drop into the right eye 2 (two) times daily.   PreserVision AREDS 2 Caps Take 1 capsule by mouth 2 (two) times daily with food. Take 1 in the morning and 1 in the evening.   risperidone 4 MG tablet Commonly known as: RISPERDAL Take 1 tablet (4 mg total) by mouth once daily.   Symbicort 160-4.5 MCG/ACT inhaler Generic drug: budesonide-formoterol Inhale 2 puffs into the lungs 2 (two) times daily.   tamsulosin 0.4 MG Caps capsule Commonly known as: FLOMAX Take 1 capsule (0.4 mg total) by mouth daily.               Durable Medical Equipment  (From admission, onward)           Start     Ordered   07/20/21 1349  For home use only DME oxygen  Once       Question Answer Comment   Length of Need 6 Months   Mode or (Route) Nasal cannula   Liters per Minute 2   Frequency Continuous (stationary and portable oxygen unit needed)   Oxygen delivery system Gas      07/20/21 1348            No Known Allergies  The results of significant diagnostics from this hospitalization (including imaging, microbiology, ancillary and laboratory) are listed below for reference.    Microbiology: No results found for this or any previous visit (from the past 240 hour(s)).  Procedures/Studies: VAS [place] LOWER EXTREMITY VENOUS (DVT)  Result Date: 07/20/2021  Lower Venous DVT Study Patient Name:  Raymond Hood  Date of Exam:   07/20/2021 Medical Rec #: [id number]  Accession #:    [id number] Date of Birth: 01-30-1970      Patient Gender: M Patient Age:   44 years Exam Location:  [facility] Procedure:      VAS [place] LOWER EXTREMITY VENOUS (DVT) Referring Phys: Oren Binet --------------------------------------------------------------------------------  Indications: Swelling.  Risk Factors: Obesity. Limitations: Body habitus, poor ultrasound/tissue interface and patient cooperation. Comparison Study: Previous exam on 10/20/20 was negative for DVT Performing Technologist: Rogelia Rohrer RVT, RDMS  Examination Guidelines: A complete evaluation includes B-mode imaging, spectral Doppler, color Doppler, and power Doppler as needed of all accessible portions of each vessel. Bilateral testing is considered an integral part of a complete examination. Limited examinations for reoccurring indications may be performed as noted. The reflux portion of the exam is performed with the patient in reverse Trendelenburg.  +---------+---------------+---------+-----------+----------+--------------+ RIGHT    CompressibilityPhasicitySpontaneityPropertiesThrombus Aging +---------+---------------+---------+-----------+----------+--------------+ CFV      Full           Yes      Yes                                  +---------+---------------+---------+-----------+----------+--------------+ SFJ      Full                                                        +---------+---------------+---------+-----------+----------+--------------+ FV Prox  Full           Yes      Yes                                 +---------+---------------+---------+-----------+----------+--------------+ FV Mid   Full           Yes      Yes                                 +---------+---------------+---------+-----------+----------+--------------+ FV DistalFull           Yes      Yes                                 +---------+---------------+---------+-----------+----------+--------------+ PFV      Full                                                        +---------+---------------+---------+-----------+----------+--------------+ POP      Full           Yes      Yes                                 +---------+---------------+---------+-----------+----------+--------------+ PTV      Full                                                        +---------+---------------+---------+-----------+----------+--------------+  PERO     Full                                                        +---------+---------------+---------+-----------+----------+--------------+   +---------+---------------+---------+-----------+----------+-------------------+ LEFT     CompressibilityPhasicitySpontaneityPropertiesThrombus Aging      +---------+---------------+---------+-----------+----------+-------------------+ CFV      Full           Yes      Yes                                      +---------+---------------+---------+-----------+----------+-------------------+ SFJ      Full                                                             +---------+---------------+---------+-----------+----------+-------------------+ FV Prox  Full           Yes      Yes                                       +---------+---------------+---------+-----------+----------+-------------------+ FV Mid   Full           Yes      Yes                                      +---------+---------------+---------+-----------+----------+-------------------+ FV DistalFull           Yes      Yes                                      +---------+---------------+---------+-----------+----------+-------------------+ PFV      Full                                                             +---------+---------------+---------+-----------+----------+-------------------+ POP      Full           Yes      Yes                                      +---------+---------------+---------+-----------+----------+-------------------+ PTV      Full                                                             +---------+---------------+---------+-----------+----------+-------------------+ PERO  Not well visualized +---------+---------------+---------+-----------+----------+-------------------+     Summary: BILATERAL: - No evidence of deep vein thrombosis seen in the lower extremities, bilaterally. -No evidence of popliteal cyst, bilaterally.   *See table(s) above for measurements and observations. Electronically signed by Servando Snare MD on 07/20/2021 at 2:59:37 PM.    Final    ECHOCARDIOGRAM COMPLETE  Result Date: 07/15/2021    ECHOCARDIOGRAM REPORT   Patient Name:   Raymond Hood Date of Exam: 07/15/2021 Medical Rec #:  [id number]     Height:       68.0 in Accession #:    [id number]    Weight:       374.3 lb Date of Birth:  12/24/1970     BSA:          2.668 m Patient Age:    62 years      BP:           127/82 mmHg Patient Gender: M             HR:           92 bpm. Exam Location:  Inpatient Procedure: 2D Echo, Cardiac Doppler, Color Doppler and Intracardiac            Opacification Agent Indications:    CHF  History:        Patient has prior history of Echocardiogram  examinations. COPD;                 Risk Factors:Hypertension, Diabetes, Sleep Apnea and Current                 Smoker.  Sonographer:    Jyl Heinz Referring Phys: Orma Flaming  Sonographer Comments: Patient is morbidly obese. Image acquisition challenging due to patient body habitus. Pt sitting up, on BiPAP IMPRESSIONS  1. Left ventricular ejection fraction, by estimation, is 55 to 60%. The left ventricle has normal function. The left ventricle has no regional wall motion abnormalities. Left ventricular diastolic parameters are consistent with Grade I diastolic dysfunction (impaired relaxation).  2. Right ventricular systolic function is normal. The right ventricular size is normal.  3. The mitral valve is normal in structure. No evidence of mitral valve regurgitation. No evidence of mitral stenosis.  4. The aortic valve is tricuspid. Aortic valve regurgitation is not visualized. No aortic stenosis is present.  5. The inferior vena cava is normal in size with greater than 50% respiratory variability, suggesting right atrial pressure of 3 mmHg. Comparison(s): No significant change from prior study. Prior images reviewed side by side. FINDINGS  Left Ventricle: Left ventricular ejection fraction, by estimation, is 55 to 60%. The left ventricle has normal function. The left ventricle has no regional wall motion abnormalities. Definity contrast agent was given IV to delineate the left ventricular  endocardial borders. The left ventricular internal cavity size was normal in size. There is no left ventricular hypertrophy. Left ventricular diastolic parameters are consistent with Grade I diastolic dysfunction (impaired relaxation). Right Ventricle: The right ventricular size is normal. No increase in right ventricular wall thickness. Right ventricular systolic function is normal. Left Atrium: Left atrial size was normal in size. Right Atrium: Right atrial size was normal in size. Pericardium: There is no evidence of  pericardial effusion. Mitral Valve: The mitral valve is normal in structure. No evidence of mitral valve regurgitation. No evidence of mitral valve stenosis. Tricuspid Valve: The tricuspid valve is normal in structure. Tricuspid valve regurgitation is trivial. No evidence of tricuspid stenosis. Aortic Valve:  The aortic valve is tricuspid. Aortic valve regurgitation is not visualized. No aortic stenosis is present. Aortic valve peak gradient measures 7.3 mmHg. Pulmonic Valve: The pulmonic valve was normal in structure. Pulmonic valve regurgitation is not visualized. No evidence of pulmonic stenosis. Aorta: The aortic root is normal in size and structure. Venous: The inferior vena cava is normal in size with greater than 50% respiratory variability, suggesting right atrial pressure of 3 mmHg. IAS/Shunts: No atrial level shunt detected by color flow Doppler.  LEFT VENTRICLE PLAX 2D LVIDd:         5.30 cm      Diastology LVIDs:         4.00 cm      LV e' medial:    5.77 cm/s LV PW:         1.10 cm      LV E/e' medial:  12.5 LV IVS:        1.10 cm      LV e' lateral:   9.46 cm/s LVOT diam:     2.00 cm      LV E/e' lateral: 7.6 LV SV:         58 LV SV Index:   22 LVOT Area:     3.14 cm  LV Volumes (MOD) LV vol d, MOD A2C: 114.0 ml LV vol d, MOD A4C: 124.0 ml LV vol s, MOD A2C: 48.8 ml LV vol s, MOD A4C: 51.7 ml LV SV MOD A2C:     65.2 ml LV SV MOD A4C:     124.0 ml LV SV MOD BP:      67.5 ml RIGHT VENTRICLE             IVC RV Basal diam:  3.80 cm     IVC diam: 2.80 cm RV Mid diam:    3.90 cm RV S prime:     10.10 cm/s TAPSE (M-mode): 1.8 cm LEFT ATRIUM             Index        RIGHT ATRIUM           Index LA diam:        3.30 cm 1.24 cm/m   RA Area:     19.00 cm LA Vol (A2C):   53.7 ml 20.13 ml/m  RA Volume:   54.60 ml  20.47 ml/m LA Vol (A4C):   39.7 ml 14.88 ml/m LA Biplane Vol: 48.5 ml 18.18 ml/m  AORTIC VALVE AV Area (Vmax): 2.89 cm AV Vmax:        135.00 cm/s AV Peak Grad:   7.3 mmHg LVOT Vmax:      124.00  cm/s LVOT Vmean:     89.200 cm/s LVOT VTI:       0.185 m  AORTA Ao Root diam: 3.40 cm Ao Asc diam:  3.30 cm MITRAL VALVE MV Area (PHT): 3.95 cm    SHUNTS MV Decel Time: 192 msec    Systemic VTI:  0.18 m MV E velocity: 72.00 cm/s  Systemic Diam: 2.00 cm MV A velocity: 70.20 cm/s MV E/A ratio:  1.03 Candee Furbish MD Electronically signed by Candee Furbish MD Signature Date/Time: 07/15/2021/11:31:48 AM    Final    DG Chest Port 1V same Day  Result Date: 07/15/2021 CLINICAL DATA:  Shortness of breath, COPD, smoker EXAM: PORTABLE CHEST 1 VIEW COMPARISON:  Portable exam 0852 hours compared to 07/14/2021 FINDINGS: Enlargement of cardiac silhouette. Mediastinal contours normal. Low lung volumes with RIGHT  basilar atelectasis and question small RIGHT pleural effusion. Upper lungs clear. No pneumothorax or acute osseous findings. IMPRESSION: Enlargement of cardiac silhouette. Low lung volumes with RIGHT basilar atelectasis and question small RIGHT pleural effusion. Electronically Signed   By: Lavonia Dana M.D.   On: 07/15/2021 09:08   DG Chest Portable 1 View  Result Date: 07/14/2021 CLINICAL DATA:  Shortness of breath, history COPD, smoker, hypertension diabetes EXAM: PORTABLE CHEST 1 VIEW COMPARISON:  01/13/2021 FINDINGS: Limited exam because of body habitus and portable technique. Patchy bibasilar opacities worse on the right, obscuring the right hemidiaphragm concerning for basilar pneumonia. Small effusion on the right not excluded. Heart is enlarged. No pneumothorax. Trachea midline. IMPRESSION: Bibasilar opacities, worse on the right with associated small right effusion concerning for basilar pneumonia. Stable heart size and vascularity. Electronically Signed   By: Jerilynn Mages.  Shick M.D.   On: 07/14/2021 15:26    Labs: BNP (last 3 results) Recent Labs    10/15/20 0931 01/13/21 1656 07/14/21 1550  BNP 10.5 184.6* 381.7*   Basic Metabolic Panel: Recent Labs  Lab 07/20/21 0230 07/21/21 0246 07/22/21 0211  07/23/21 0234 07/24/21 0201  NA 137 138 137 137 138  K 3.7 3.5 3.1* 3.5 3.8  CL 91* 94* 95* 96* 97*  CO2 32 33* 33* 30 30  GLUCOSE 202* 165* 160* 158* 142*  BUN 34* 31* 27* 28* 23*  CREATININE 1.02 0.91 0.91 0.92 0.72  CALCIUM 8.7* 8.7* 8.7* 8.4* 8.6*   Liver Function Tests: No results for input(s): "AST", "ALT", "ALKPHOS", "BILITOT", "PROT", "ALBUMIN" in the last 168 hours. No results for input(s): "LIPASE", "AMYLASE" in the last 168 hours. No results for input(s): "AMMONIA" in the last 168 hours. CBC: No results for input(s): "WBC", "NEUTROABS", "HGB", "HCT", "MCV", "PLT" in the last 168 hours. Cardiac Enzymes: No results for input(s): "CKTOTAL", "CKMB", "CKMBINDEX", "TROPONINI" in the last 168 hours. BNP: Invalid input(s): "POCBNP" CBG: Recent Labs  Lab 07/25/21 1242 07/25/21 1649 07/25/21 2053 07/26/21 0550 07/26/21 1148  GLUCAP 164* 157* 121* 163* 137*   D-Dimer No results for input(s): "DDIMER" in the last 72 hours. Hgb A1c No results for input(s): "HGBA1C" in the last 72 hours. Lipid Profile No results for input(s): "CHOL", "HDL", "LDLCALC", "TRIG", "CHOLHDL", "LDLDIRECT" in the last 72 hours. Thyroid function studies No results for input(s): "TSH", "T4TOTAL", "T3FREE", "THYROIDAB" in the last 72 hours.  Invalid input(s): "FREET3" Anemia work up No results for input(s): "VITAMINB12", "FOLATE", "FERRITIN", "TIBC", "IRON", "RETICCTPCT" in the last 72 hours. Urinalysis    Component Value Date/Time   COLORURINE YELLOW (A) 11/16/2020 2238   APPEARANCEUR HAZY (A) 11/16/2020 2238   LABSPEC >1.046 (H) 11/16/2020 2238   PHURINE 5.0 11/16/2020 2238   GLUCOSEU NEGATIVE 11/16/2020 2238   HGBUR NEGATIVE 11/16/2020 2238   [place] NEGATIVE 11/16/2020 2238   [place] 11/16/2020 2238   PROTEINUR NEGATIVE 11/16/2020 2238   NITRITE NEGATIVE 11/16/2020 2238   LEUKOCYTESUR NEGATIVE 11/16/2020 2238   Sepsis Labs No results for input(s): "WBC" in the last 168  hours.  Invalid input(s): "PROCALCITONIN", "LACTICIDVEN" Microbiology No results found for this or any previous visit (from the past 240 hour(s)). Time coordinating discharge:25 minutes  SIGNED: Antonieta Pert, MD  [facility] 07/26/2021, 2:01 PM  If 7PM-7AM, please contact night-coverage [email or web address]

## 2021-07-25 NOTE — Progress Notes (Signed)
PROGRESS NOTE Raymond Hood  KDX:[id number] DOB: 1970-01-22 DOA: 07/14/2021 PCP: [name], MD   Brief Narrative/Hospital Course: 51 year old male with multiple medical problems including COPD/OSA/OHS, HF PEF, HTN, T2DM, schizoaffective/BPD/homelessness, ongoing tobacco use presented with several days history of worsening shortness of breath (seen by PCP recently-started on steroids/cefdinir/Lasix dose escalated)-found to have acute hypoxic respiratory failure due to PNA and decompensated heart failure.  Patient was admitted and treated for pneumonia, CHF exacerbation, hypoxia, hypercapnia initially needing BiPAP> then to HFNC.  Blood culture 7/5/6 no growth, respiratory virus panel COVID-19 negative, bilateral lower extremity no DVT on 7/11 echo with EF 55 to 60%, G1 DD.    Subjective:  Seen and examined this morning.  He is resting comfortably Overnight patient has been afebrile, used BiPAP.   Complains of soreness all over, on room air. Reports he got up yesterday  Assessment and Plan: Principal Problem:   Acute on chronic respiratory failure with hypoxia (HCC) Active Problems:   Sepsis from pneumonia    Acute on chronic diastolic CHF (congestive heart failure) (HCC)   COPD with chronic bronchitis (HCC)   Conjunctivitis   Prolonged QT interval   Type 2 diabetes mellitus with hyperglycemia (HCC)   Essential hypertension   GERD (gastroesophageal reflux disease)   Schizo affective schizophrenia (HCC)   Anxiety and depression   OSA/OHS   Tobacco abuse   Homeless   CAP (community acquired pneumonia)   Acute on chronic respiratory failure with hypoxia  CAP Sepsis from pneumonia  Acute COPD exacerbation with chronic bronchitis: Shortness of breath/hypoxic hypercapnic respiratory failure multifactorial due to sepsis due to pneumonia, COPD and CHF exacerbation.  Able to wean off oxygen, completed antibiotics/steroid. Continue with scheduled albuterol every 6 hours along with  other home bronchodilators,incentive spirometry flutter valve, antitussives.  Acute on chronic diastolic CHF: Patient has been diuresed with IV Lasix transition to p.o., monitor electrolytes volume status monitor closely along with intake output.  Continue fluid restriction diet, focusing on education as he is noncompliant. cont [place] Caster.Net IO Since Admission: -21,045 mL [07/25/21 1012]    OSA/OHS: Uses NIV at the shelter.  Continue BiPAP.  Conjunctivitis bilateral, treated with erythromycin ointment.  Improved  T2DM with uncontrolled hyperglycemia: Currently well controlled  on metformin/Tradjenta, Premeal insulin 12 units TID and Semglee 35 u. Recent Labs  Lab 07/23/21 2120 07/24/21 0815 07/24/21 1150 07/24/21 1610 07/24/21 1948  GLUCAP 164* 173* 174* 167* 134*  Essential hypertension: Well-controlled on Lasix HLD on statin GERD: Continue Pepcid  Homeless Schizo affective schizophrenia Anxiety and depression: Mood stable continue Risperdal, Neurontin Cymbalta  Tobacco abuse-cessation advised continue nicotine patch Debility deconditioning encourage ambulation PT OT  Morbid Obesity:Patient's Body mass index is 53.7 kg/m. : Will benefit with PCP follow-up, weight loss  healthy lifestyle and cont home NIV   DVT prophylaxis:  Code Status:   Code Status: Full Code Family Communication: plan of care discussed with patient at bedside. Patient status is: INPATIENT  because of ONGOING shortness of breath and wheezing Level of care: Progressive   Dispo: The patient is from: homeless shelter            Anticipated disposition: homeless shelter on Monday as discussed with his PCP on Friday  Mobility Assessment (last 72 hours)     Mobility Assessment     Row Name 07/24/21 2212 07/24/21 1530 07/23/21 2030 07/23/21 1400 07/22/21 2230   Does patient have an order for bedrest or is patient medically unstable No - Continue assessment No -  Continue assessment No - Continue  assessment -- No - Continue assessment   What is the highest level of mobility based on the progressive mobility assessment? Level 5 (Walks with assist in room/hall) - Balance while stepping forward/back and can walk in room with assist - Complete Level 2 (Chairfast) - Balance while sitting on edge of bed and cannot stand -- Level 5 (Walks with assist in room/hall) - Balance while stepping forward/back and can walk in room with assist - Complete Level 5 (Walks with assist in room/hall) - Balance while stepping forward/back and can walk in room with assist - Complete   Is the above level different from baseline mobility prior to current illness? Yes - Recommend PT order Yes - Recommend PT order -- -- Yes - Recommend PT order             Objective: Vitals last 24 hrs: Vitals:   07/25/21 0503 07/25/21 0808 07/25/21 0846 07/25/21 0957  BP: 123/82 131/79    Pulse: 96 96  95  Resp: _0 Temp: 98.3 F (36.8 C) 98.4 F (36.9 C)    TempSrc: Oral Oral    SpO2: 94% 93% 91% 90%  Weight:      Height:       Weight change:   Physical Examination: General exam: AA OX3, morbidly obese, older than stated age, weak appearing. HEENT:Oral mucosa moist, Ear/Nose WNL grossly, dentition normal. Respiratory system: bilaterally diminished, no use of accessory muscle Cardiovascular system: S1 & S2 +, No JVD,. Gastrointestinal system: Abdomen soft,NT,ND,BS+ Nervous System:Alert, awake, moving extremities and grossly nonfocal Extremities: LE ankle edema neg, distal peripheral pulses palpable.  Skin: No rashes,no icterus. MSK: Normal muscle bulk,tone, power   Medications reviewed:  Scheduled Meds:  albuterol  2.5 mg Nebulization Q6H   atorvastatin  10 mg Oral Daily   budesonide (PULMICORT) nebulizer solution  0.25 mg Nebulization BID   dapagliflozin propanediol  10 mg Oral Daily   DULoxetine  60 mg Oral Daily   enoxaparin (LOVENOX) injection  80 mg Subcutaneous Q24H   famotidine  20 mg Oral BID    fluticasone  2 spray Each Nare Daily   furosemide  40 mg Oral BID   gabapentin  300 mg Oral BID   guaiFENesin  600 mg Oral BID   insulin aspart  0-20 Units Subcutaneous TID WC   insulin aspart  0-5 Units Subcutaneous QHS   insulin aspart  12 Units Subcutaneous TID WC   insulin glargine-yfgn  35 Units Subcutaneous Daily   insulin starter kit- pen needles  1 kit Other Once   linagliptin  5 mg Oral Daily   metFORMIN  1,000 mg Oral BID WC   nicotine  21 mg Transdermal Daily   mouth rinse  15 mL Mouth Rinse 4 times per day   polyethylene glycol  17 g Oral Daily   potassium chloride  20 mEq Oral Daily   revefenacin  175 mcg Nebulization Daily   risperidone  4 mg Oral Daily   sodium chloride flush  3 mL Intravenous Q12H   tamsulosin  0.4 mg Oral Daily   Continuous Infusions:  sodium chloride        Diet Order             Diet heart healthy/carb modified Room service appropriate? Yes; Fluid consistency: Thin; Fluid restriction: 1200 mL Fluid  Diet effective now  Intake/Output Summary (Last 24 hours) at 07/25/2021 1012 Last data filed at 07/25/2021 0810 Gross per 24 hour  Intake 720 ml  Output 2700 ml  Net -1980 ml   Net IO Since Admission: -21,045 mL [07/25/21 1012]  Wt Readings from Last 3 Encounters:  07/24/21 (!) 160.2 kg  07/14/21 (!) 175.6 kg  07/14/21 (!) 174.3 kg     [employer] (From admission, onward)    None     Data Reviewed: I have personally reviewed following labs and imaging studies CBC: No results for input(s): "WBC", "NEUTROABS", "HGB", "HCT", "MCV", "PLT" in the last 168 hours. Basic Metabolic Panel: Recent Labs  Lab 07/20/21 0230 07/21/21 0246 07/22/21 0211 07/23/21 0234 07/24/21 0201  NA 137 138 137 137 138  K 3.7 3.5 3.1* 3.5 3.8  CL 91* 94* 95* 96* 97*  CO2 32 33* 33* 30 30  GLUCOSE 202* 165* 160* 158* 142*  BUN 34* 31* 27* 28* 23*  CREATININE 1.02 0.91 0.91 0.92 0.72  CALCIUM 8.7* 8.7* 8.7*  8.4* 8.6*   GFR: Estimated Creatinine Clearance: 167.9 mL/min (by C-G formula based on SCr of 0.72 mg/dL). Liver Function Tests: No results for input(s): "AST", "ALT", "ALKPHOS", "BILITOT", "PROT", "ALBUMIN" in the last 168 hours. No results for input(s): "LIPASE", "AMYLASE" in the last 168 hours. No results for input(s): "AMMONIA" in the last 168 hours. Coagulation Profile: No results for input(s): "INR", "PROTIME" in the last 168 hours. BNP (last 3 results) No results for input(s): "PROBNP" in the last 8760 hours. HbA1C: No results for input(s): "HGBA1C" in the last 72 hours. CBG: Recent Labs  Lab 07/23/21 2120 07/24/21 0815 07/24/21 1150 07/24/21 1610 07/24/21 1948  GLUCAP 164* 173* 174* 167* 134*   Lipid Profile: No results for input(s): "CHOL", "HDL", "LDLCALC", "TRIG", "CHOLHDL", "LDLDIRECT" in the last 72 hours. Thyroid Function Tests: No results for input(s): "TSH", "T4TOTAL", "FREET4", "T3FREE", "THYROIDAB" in the last 72 hours. Sepsis Labs: No results for input(s): "PROCALCITON", "LATICACIDVEN" in the last 168 hours.  No results found for this or any previous visit (from the past 240 hour(s)).   Antimicrobials: Anti-infectives (From admission, onward)    Start     Dose/Rate Route Frequency Ordered Stop   07/15/21 1545  cefTRIAXone (ROCEPHIN) 2 g in sodium chloride 0.9 % 100 mL IVPB  Status:  Discontinued        2 g 200 mL/hr over 30 Minutes Intravenous Every 24 hours 07/14/21 1841 07/19/21 1141   07/14/21 2200  doxycycline (VIBRA-TABS) tablet 100 mg  Status:  Discontinued        100 mg Oral Every 12 hours 07/14/21 1920 07/19/21 1141   07/14/21 1545  cefTRIAXone (ROCEPHIN) 2 g in sodium chloride 0.9 % 100 mL IVPB        2 g 200 mL/hr over 30 Minutes Intravenous  Once 07/14/21 1540 07/14/21 1702   07/14/21 1545  azithromycin (ZITHROMAX) 500 mg in sodium chloride 0.9 % 250 mL IVPB  Status:  Discontinued        500 mg 250 mL/hr over 60 Minutes Intravenous Every 24  hours 07/14/21 1540 07/14/21 1920      Culture/Microbiology    Component Value Date/Time   SDES BLOOD RIGHT ANTECUBITAL 07/15/2021 0558   SPECREQUEST  07/15/2021 0558    BOTTLES DRAWN AEROBIC AND ANAEROBIC Blood Culture adequate volume   CULT  07/15/2021 0558    NO GROWTH 5 DAYS Performed at [facility], [place] [address], [place], [place] [address]  REPTSTATUS 07/20/2021 FINAL 07/15/2021 0558  Radiology Studies: No results found.   LOS: 11 days   Antonieta Pert, MD [facility]  07/25/2021, 10:12 AM

## 2021-07-26 ENCOUNTER — Telehealth (HOSPITAL_COMMUNITY): Payer: Self-pay | Admitting: Pharmacy Technician

## 2021-07-26 ENCOUNTER — Emergency Department (HOSPITAL_COMMUNITY): Payer: Medicaid Other

## 2021-07-26 ENCOUNTER — Observation Stay (HOSPITAL_COMMUNITY)
Admission: EM | Admit: 2021-07-26 | Discharge: 2021-07-30 | Disposition: A | Discharge status: Home / Self Care | Payer: Medicaid Other | Attending: Internal Medicine | Admitting: Internal Medicine

## 2021-07-26 ENCOUNTER — Other Ambulatory Visit (HOSPITAL_COMMUNITY): Payer: Self-pay

## 2021-07-26 ENCOUNTER — Encounter (HOSPITAL_COMMUNITY): Payer: Self-pay

## 2021-07-26 ENCOUNTER — Other Ambulatory Visit: Payer: Self-pay

## 2021-07-26 DIAGNOSIS — E1140 Type 2 diabetes mellitus with diabetic neuropathy, unspecified: Secondary | ICD-10-CM | POA: Insufficient documentation

## 2021-07-26 DIAGNOSIS — F17210 Nicotine dependence, cigarettes, uncomplicated: Secondary | ICD-10-CM | POA: Insufficient documentation

## 2021-07-26 DIAGNOSIS — R9431 Abnormal electrocardiogram [ECG] [EKG]: Secondary | ICD-10-CM | POA: Diagnosis present

## 2021-07-26 DIAGNOSIS — N179 Acute kidney failure, unspecified: Secondary | ICD-10-CM | POA: Diagnosis not present

## 2021-07-26 DIAGNOSIS — G4733 Obstructive sleep apnea (adult) (pediatric): Secondary | ICD-10-CM | POA: Diagnosis present

## 2021-07-26 DIAGNOSIS — K219 Gastro-esophageal reflux disease without esophagitis: Secondary | ICD-10-CM | POA: Diagnosis present

## 2021-07-26 DIAGNOSIS — Z79899 Other long term (current) drug therapy: Secondary | ICD-10-CM | POA: Insufficient documentation

## 2021-07-26 DIAGNOSIS — I5032 Chronic diastolic (congestive) heart failure: Secondary | ICD-10-CM | POA: Insufficient documentation

## 2021-07-26 DIAGNOSIS — E1165 Type 2 diabetes mellitus with hyperglycemia: Secondary | ICD-10-CM | POA: Insufficient documentation

## 2021-07-26 DIAGNOSIS — J441 Chronic obstructive pulmonary disease with (acute) exacerbation: Secondary | ICD-10-CM | POA: Insufficient documentation

## 2021-07-26 DIAGNOSIS — Z7984 Long term (current) use of oral hypoglycemic drugs: Secondary | ICD-10-CM | POA: Insufficient documentation

## 2021-07-26 DIAGNOSIS — R0602 Shortness of breath: Secondary | ICD-10-CM

## 2021-07-26 DIAGNOSIS — J449 Chronic obstructive pulmonary disease, unspecified: Secondary | ICD-10-CM | POA: Diagnosis present

## 2021-07-26 DIAGNOSIS — F259 Schizoaffective disorder, unspecified: Secondary | ICD-10-CM | POA: Diagnosis present

## 2021-07-26 DIAGNOSIS — Z794 Long term (current) use of insulin: Secondary | ICD-10-CM | POA: Insufficient documentation

## 2021-07-26 DIAGNOSIS — Z6841 Body Mass Index (BMI) 40.0 and over, adult: Secondary | ICD-10-CM | POA: Insufficient documentation

## 2021-07-26 DIAGNOSIS — R000 Tachycardia, unspecified: Secondary | ICD-10-CM | POA: Insufficient documentation

## 2021-07-26 DIAGNOSIS — I110 Hypertensive heart disease with heart failure: Secondary | ICD-10-CM | POA: Insufficient documentation

## 2021-07-26 LAB — CBC WITH DIFFERENTIAL/PLATELET
Abs Immature Granulocytes: 0.16 10*3/uL — ABNORMAL HIGH (ref 0.00–0.07)
Basophils Absolute: 0.1 10*3/uL (ref 0.0–0.1)
Basophils Relative: 0 %
Eosinophils Absolute: 0.2 10*3/uL (ref 0.0–0.5)
Eosinophils Relative: 1 %
HCT: 50 % (ref 39.0–52.0)
Hemoglobin: 15.2 g/dL (ref 13.0–17.0)
Immature Granulocytes: 1 %
Lymphocytes Relative: 10 %
Lymphs Abs: 1.5 10*3/uL (ref 0.7–4.0)
MCH: 26.2 pg (ref 26.0–34.0)
MCHC: 30.4 g/dL (ref 30.0–36.0)
MCV: 86.1 fL (ref 80.0–100.0)
Monocytes Absolute: 1.2 10*3/uL — ABNORMAL HIGH (ref 0.1–1.0)
Monocytes Relative: 8 %
Neutro Abs: 11.9 10*3/uL — ABNORMAL HIGH (ref 1.7–7.7)
Neutrophils Relative %: 80 %
Platelets: 209 10*3/uL (ref 150–400)
RBC: 5.81 MIL/uL (ref 4.22–5.81)
RDW: 15.8 % — ABNORMAL HIGH (ref 11.5–15.5)
WBC: 15 10*3/uL — ABNORMAL HIGH (ref 4.0–10.5)
nRBC: 0 % (ref 0.0–0.2)

## 2021-07-26 LAB — GLUCOSE, CAPILLARY
Glucose-Capillary: 163 mg/dL — ABNORMAL HIGH (ref 70–99)
Glucose-Capillary: 137 mg/dL — ABNORMAL HIGH (ref 70–99)
Glucose-Capillary: 147 mg/dL — ABNORMAL HIGH (ref 70–99)

## 2021-07-26 LAB — BASIC METABOLIC PANEL
Anion gap: 11 (ref 5–15)
BUN: 19 mg/dL (ref 6–20)
CO2: 27 mmol/L (ref 22–32)
Calcium: 8.5 mg/dL — ABNORMAL LOW (ref 8.9–10.3)
Chloride: 98 mmol/L (ref 98–111)
Creatinine, Ser: 1.44 mg/dL — ABNORMAL HIGH (ref 0.61–1.24)
GFR, Estimated: 60 mL/min — ABNORMAL LOW (ref >60)
Glucose, Bld: 227 mg/dL — ABNORMAL HIGH (ref 70–99)
Potassium: 4.2 mmol/L (ref 3.5–5.1)
Sodium: 136 mmol/L (ref 135–145)

## 2021-07-26 LAB — CK: Total CK: 27 U/L — ABNORMAL LOW (ref 49–397)

## 2021-07-26 LAB — TROPONIN I (HIGH SENSITIVITY): Troponin I (High Sensitivity): 9 ng/L (ref <18)

## 2021-07-26 MED ORDER — FUROSEMIDE 40 MG PO TABS
40.0000 mg | ORAL_TABLET | Freq: Two times a day (BID) | ORAL | 0 refills | Status: DC
  Filled 2021-07-26: qty 30, 15d supply, fill #0

## 2021-07-26 MED ORDER — IPRATROPIUM-ALBUTEROL 0.5-2.5 (3) MG/3ML IN SOLN
3.0000 mL | Freq: Once | RESPIRATORY_TRACT | Status: AC
  Administered 2021-07-26: 3 mL via RESPIRATORY_TRACT
  Filled 2021-07-26: qty 3

## 2021-07-26 MED ORDER — INSULIN ASPART 100 UNIT/ML FLEXPEN
12.0000 [IU] | PEN_INJECTOR | Freq: Three times a day (TID) | SUBCUTANEOUS | 1 refills | Status: DC
  Filled 2021-07-26 (×2): qty 12, 30d supply, fill #0

## 2021-07-26 MED ORDER — INSULIN PEN NEEDLE 32G X 4 MM MISC
1.0000 | Freq: Every day | 0 refills | Status: DC
  Filled 2021-07-26 (×2): qty 100, 30d supply, fill #0

## 2021-07-26 MED ORDER — LINAGLIPTIN 5 MG PO TABS
5.0000 mg | ORAL_TABLET | Freq: Every day | ORAL | 0 refills | Status: DC
  Filled 2021-07-26: qty 30, 30d supply, fill #0

## 2021-07-26 MED ORDER — FINGERSTIX LANCETS MISC
0 refills | Status: DC
  Filled 2021-07-26: qty 100, 25d supply, fill #0

## 2021-07-26 MED ORDER — INSULIN GLARGINE 100 UNIT/ML SOLOSTAR PEN
35.0000 [IU] | PEN_INJECTOR | Freq: Every day | SUBCUTANEOUS | 1 refills | Status: DC
  Filled 2021-07-26: qty 15, 42d supply, fill #0
  Filled 2021-07-26: qty 12, 30d supply, fill #0

## 2021-07-26 MED ORDER — LACTATED RINGERS IV BOLUS
1000.0000 mL | Freq: Once | INTRAVENOUS | Status: AC
  Administered 2021-07-26: 1000 mL via INTRAVENOUS

## 2021-07-26 MED ORDER — LACTATED RINGERS IV BOLUS: 1000.0000 mL | Freq: Once | INTRAVENOUS | Status: DC

## 2021-07-26 MED ORDER — GLUCOSE BLOOD VI STRP
ORAL_STRIP | 0 refills | Status: DC
  Filled 2021-07-26: qty 100, 25d supply, fill #0

## 2021-07-26 MED ORDER — ACCU-CHEK GUIDE W/DEVICE KIT
PACK | 0 refills | Status: DC
  Filled 2021-07-26 (×2): qty 1, 30d supply, fill #0

## 2021-07-26 NOTE — TOC Benefit Eligibility Note (Signed)
Patient Product/process development scientist completed.    The patient is currently admitted and upon discharge could be taking Humalog Pens.  The current 30 day co-pay is, $4.00.   The patient is currently admitted and upon discharge could be taking Novolog Pens.  The current 30 day co-pay is, $4.00.   The patient is currently admitted and upon discharge could be taking Lantus Pens.  The current 30 day co-pay is, $4.00.   The patient is currently admitted and upon discharge could be taking Levemir Pens.  The current 30 day co-pay is, $4.00.   The patient is currently admitted and upon discharge could be taking Social research officer, government.  Requires Prior Authorization   The patient is insured through [facility] Medicaid     Roland Earl, CPhT Pharmacy Patient Advocate Specialist [facility] Pharm[facility] Patient Advocate Team Direct Number: [phone]  Fax: [phone]

## 2021-07-26 NOTE — ED Provider Notes (Signed)
[facility] EMERGENCY DEPARTMENT Provider Note   CSN: [id number] Arrival date & time: 07/26/21  2026     History  Chief Complaint  Patient presents with   Dizziness   Shortness of Breath    Raymond Hood is a 51 y.o. male with history of COPD, hypertension, CHF, hypertension, type 2 diabetes, schizoaffective/BPD, homelessness and ongoing tobacco use who is just discharged from the hospital this morning after a 12-day stay who presents to the ED for shortness of breath.  Patient was admitted to treat pneumonia with CHF exacerbation and hypoxia.  He diuresed 25 L off of him.  Patient was to recheck labs next week.  He states that after he left, he was sitting out in the heat and he became very short of breath and diaphoretic.  He denies chest pain, abdominal pain, nausea, vomiting and diarrhea.    Dizziness Associated symptoms: shortness of breath   Associated symptoms: no chest pain and no palpitations   Shortness of Breath Associated symptoms: diaphoresis and wheezing   Associated symptoms: no chest pain, no cough and no fever        Home Medications Prior to Admission medications   Medication Sig Start Date End Date Taking? Authorizing Provider  albuterol (VENTOLIN HFA) 108 (90 Base) MCG/ACT inhaler Inhale 2 puffs into the lungs every 6  hours as needed for wheezing or shortness of breath. 01/20/21   Storm Frisk, MD  atorvastatin (LIPITOR) 10 MG tablet Take 1 tablet (10 mg total) by mouth once daily. 06/24/21   Storm Frisk, MD  Blood Glucose Monitoring Suppl (ACCU-CHEK GUIDE) w/Device KIT Use up to four times daily as directed. 07/26/21   Lanae Boast, MD  budesonide-formoterol (SYMBICORT) 160-4.5 MCG/ACT inhaler Inhale 2 puffs into the lungs 2 (two) times daily. 03/24/21   Zigmund Daniel., MD  ciprofloxacin (CILOXAN) 0.3 % ophthalmic solution Place 1 drop into the right eye every 4 (four) hours while awake. 06/23/21   Storm Frisk, MD   dapagliflozin propanediol (FARXIGA) 10 MG TABS tablet Take 1 tablet (10 mg total) by mouth daily. 07/09/21   Storm Frisk, MD  DULoxetine (CYMBALTA) 60 MG capsule Take 1 capsule (60 mg total) by mouth daily. 06/24/21   Storm Frisk, MD  famotidine (PEPCID) 20 MG tablet Take 1 tablet (20 mg total) by mouth 2 (two) times daily. 06/24/21   Storm Frisk, MD  Fingerstix Lancets MISC Use as directed up to 4 times daily. 07/26/21   Lanae Boast, MD  fluticasone (FLONASE) 50 MCG/ACT nasal spray Place 2 sprays into both nostrils daily. 01/13/21   Storm Frisk, MD  furosemide (LASIX) 40 MG tablet Take 1 tablet (40 mg total) by mouth 2 (two) times daily. 07/26/21 08/25/21  Lanae Boast, MD  gabapentin (NEURONTIN) 300 MG capsule Take 2 capsules in the morning AND 1 capsule midday AND 2 capsules every evening. 06/10/21   Storm Frisk, MD  glucose blood test strip Use four times daily as directed. 07/26/21   Lanae Boast, MD  hydrOXYzine (ATARAX) 25 MG tablet Take 1 tablet (25 mg total) by mouth 3 (three) times daily as needed. 06/24/21   Storm Frisk, MD  insulin aspart (NOVOLOG) 100 UNIT/ML FlexPen Inject 12 Units into the skin 3 (three) times daily with meals. 07/26/21   Lanae Boast, MD  insulin glargine (LANTUS) 100 UNIT/ML Solostar Pen Inject 35 Units into the skin daily. 07/26/21 10/20/21  Lanae Boast, MD  Insulin  Pen Needle 32G X 4 MM MISC Use with insulin pens. 07/26/21 08/25/21  Lanae Boast, MD  linagliptin (TRADJENTA) 5 MG TABS tablet Take 1 tablet (5 mg total) by mouth daily. 07/27/21 08/26/21  Lanae Boast, MD  metFORMIN (GLUCOPHAGE) 500 MG tablet Take 2 tablets (1,000 mg total) by mouth 2 (two) times daily with a meal. 06/10/21   Storm Frisk, MD  Multiple Vitamins-Minerals (PRESERVISION AREDS 2) CAPS Take 1 capsule by mouth 2 (two) times daily with food. Take 1 in the morning and 1 in the evening. 04/29/21   Storm Frisk, MD  olopatadine (PATANOL) 0.1 % ophthalmic solution Place 1 drop into  the right eye 2 (two) times daily. 03/25/21   Storm Frisk, MD  risperidone (RISPERDAL) 4 MG tablet Take 1 tablet (4 mg total) by mouth once daily. 06/10/21   Storm Frisk, MD  tamsulosin (FLOMAX) 0.4 MG CAPS capsule Take 1 capsule (0.4 mg total) by mouth daily. 06/10/21   Storm Frisk, MD      Allergies    Patient has no known allergies.    Review of Systems   Review of Systems  Constitutional:  Positive for diaphoresis. Negative for fever.  Respiratory:  Positive for shortness of breath and wheezing. Negative for cough.   Cardiovascular:  Negative for chest pain and palpitations.  Neurological:  Positive for dizziness.    Physical Exam Updated Vital Signs BP (!) 120/107   Pulse (!) 117   Temp (!) 97.3 F (36.3 C) (Oral)   Resp (!) 21   Ht 5\' 8"  (1.727 m)   Wt (!) 174.8 kg   SpO2 95%   BMI 58.59 kg/m  Physical Exam Vitals and nursing note reviewed.  Constitutional:      General: He is not in acute distress.    Appearance: He is obese. He is ill-appearing and diaphoretic.     Comments: Face is red and diaphoretic  HENT:     Head: Atraumatic.  Eyes:     Conjunctiva/sclera: Conjunctivae normal.  Cardiovascular:     Rate and Rhythm: Regular rhythm. Tachycardia present.     Pulses: Normal pulses.     Heart sounds: No murmur heard. Pulmonary:     Effort: Pulmonary effort is normal. Tachypnea present. No respiratory distress.     Breath sounds: Wheezing present.  Abdominal:     General: Abdomen is flat. There is no distension.     Palpations: Abdomen is soft.     Tenderness: There is no abdominal tenderness.  Musculoskeletal:        General: Normal range of motion.     Cervical back: Normal range of motion.  Skin:    General: Skin is warm.     Capillary Refill: Capillary refill takes less than 2 seconds.  Neurological:     General: No focal deficit present.     Mental Status: He is alert.  Psychiatric:        Mood and Affect: Mood normal.     ED  Results / Procedures / Treatments   Labs (all labs ordered are listed, but only abnormal results are displayed) Labs Reviewed  CBC WITH DIFFERENTIAL/PLATELET - Abnormal; Notable for the following components:      Result Value   WBC 15.0 (*)    RDW 15.8 (*)    Neutro Abs 11.9 (*)    Monocytes Absolute 1.2 (*)    Abs Immature Granulocytes 0.16 (*)    All other components within normal  limits  BASIC METABOLIC PANEL  RAPID URINE DRUG SCREEN, HOSP PERFORMED  CK  BRAIN NATRIURETIC PEPTIDE  TROPONIN I (HIGH SENSITIVITY)  TROPONIN I (HIGH SENSITIVITY)    EKG None  Radiology DG Chest Portable 1 View  Result Date: 07/26/2021 CLINICAL DATA:  Dyspnea EXAM: PORTABLE CHEST 1 VIEW COMPARISON:  07/15/2021 FINDINGS: Lungs are clear. No pneumothorax or pleural effusion. Cardiac size is mildly enlarged, stable since prior examination. Widening of the right paratracheal soft tissues relates to vascular shadow and prominent mediastinal fat better seen on CT examination of 10/20/2020. Pulmonary vascularity is normal. No acute bone abnormality. IMPRESSION: No active disease. Electronically Signed   By: Helyn Numbers M.D.   On: 07/26/2021 22:18    Procedures Procedures   Medications Ordered in ED Medications  ipratropium-albuterol (DUONEB) 0.5-2.5 (3) MG/3ML nebulizer solution 3 mL (3 mLs Nebulization Given 07/26/21 2213)  lactated ringers bolus 1,000 mL (1,000 mLs Intravenous New Bag/Given 07/26/21 2238)    ED Course/ Medical Decision Making/ A&P                           Medical Decision Making Amount and/or Complexity of Data Reviewed Labs: ordered. Radiology: ordered.  Risk Prescription drug management.   Social determinants of health:  Social History   Socioeconomic History   Marital status: Single    Spouse name: Not on file   Number of children: Not on file   Years of education: Not on file   Highest education level: Not on file  Occupational History   Not on file  Tobacco  Use   Smoking status: Every Day    Packs/day: 1.00    Types: Cigarettes   Smokeless tobacco: Never  Vaping Use   Vaping Use: Never used  Substance and Sexual Activity   Alcohol use: Not Currently   Drug use: Never   Sexual activity: Not on file  Other Topics Concern   Not on file  Social History Narrative   Not on file   Social Determinants of Health   Financial Resource Strain: Not on file  Food Insecurity: Not on file  Transportation Needs: Not on file  Physical Activity: Not on file  Stress: Not on file  Social Connections: Not on file  Intimate Partner Violence: Not on file     Initial impression:  This patient presents to the ED for concern of shortness of breath, this involves an extensive number of treatment options, and is a complaint that carries with it a high risk of complications and morbidity.   Differentials include heat exhaustion, COPD exacerbation, pneumonia,    Comorbidities affecting care:  Per HPI  Additional history obtained: Labs and imaging from hospital stay   Lab Tests  I Ordered, reviewed, and interpreted labs and EKG.  The pertinent results include:  Leukocytosis 15, last 13.1 10 days ago  Imaging Studies ordered:  I ordered imaging studies including  Chest xray without acute findings  I independently visualized and interpreted imaging and I agree with the radiologist interpretation.   EKG: Sinus tach with low voltage  Cardiac Monitoring:  The patient was maintained on a cardiac monitor.  I personally viewed and interpreted the cardiac monitored which showed an underlying rhythm of: sinus tachycardia   Medicines ordered and prescription drug management:  I ordered medication including: 1 L LR bolus duoneb   Reevaluation of the patient after these medicines showed that the patient improved I have reviewed the patients home  medicines and have made adjustments as needed    ED Course/Re-evaluation: Presents appearing acutely  diaphoretic and flushed. Patient is tachycardic and tachypneic. Mild expiratory wheezing noted. Patient given duoneb with some improvement. Given his history of sitting out in the heat along with clinical exam findings, I am concerned symptoms related to overheating. I discussed with attending benefit of giving him fluid bolus despite his recent hospitalization for CHF and given that patient does not appear fluid overloaded on exam or xray, he was given fluids with some improvement. Care handed off to [facility] PA-C who will monitor labs. If normal and HR comes down, can likely be discharged.    Final Clinical Impression(s) / ED Diagnoses Final diagnoses:  Shortness of breath    Rx / DC Orders ED Discharge Orders     None         Delight Ovens 07/26/21 2351    Gloris Manchester, MD 07/27/21 2318

## 2021-07-26 NOTE — Plan of Care (Signed)

## 2021-07-26 NOTE — Plan of Care (Signed)

## 2021-07-26 NOTE — Progress Notes (Signed)
Pt placed on biPAP for bed. RT will cont to monitor as needed.

## 2021-07-26 NOTE — Inpatient Diabetes Management (Addendum)
Inpatient Diabetes Program Recommendations  AACE/ADA: New Consensus Statement on Inpatient Glycemic Control (2015)  Target Ranges:  Prepandial:   less than 140 mg/dL      Peak postprandial:   less than 180 mg/dL (1-2 hours)      Critically ill patients:  140 - 180 mg/dL   Lab Results  Component Value Date   GLUCAP 163 (H) 07/26/2021   HGBA1C 8.3 (A) 06/01/2021    Review of Glycemic Control  Latest Reference Range & Units 07/25/21 12:42 07/25/21 16:49 07/25/21 20:53 07/26/21 05:50  Glucose-Capillary 70 - 99 mg/dL 164 (H) 157 (H) 121 (H) 163 (H)  (H): Data is abnormally high  Diabetes history: DM2 Outpatient Diabetes medications: Farxiga 10 mg QD, Metformin 1000 mg BID Current orders for Inpatient glycemic control: Semglee 35 units QAM, Novolog 12 units TID, Novolog 0-20 units TID and 0-5 units QHS, Tradjenta 5 mg QD, Metformin 1000 mg BID  Received referral for "new insulin teaching".  DM coordinator spoke with patient on 7/14 and educated on the insulin pen.  He was able to return demonstrate for her.    Met with patient at bedside.  He is laying in bed and seems very sleepy.  During conversation eyes keep slowly closing.  Re-educated him on the insulin pen, short and long acting insulins, when to administer, when to check his CBG, hypoglycemia (signs, symptoms and treatments).    Asked if he could return demonstrate and he stated, "not right now".  His eyes continue to close.    He does not have a glucometer at home.  When asked how long he's had diabetes he states, "6 months".  He states he has never checked his blood sugar with a glucometer at home before.    Spoke with TOC, Wendi and it appears he does have Medicaid.  He is in the process of getting disability.  He see's [name] with [facility].  Asked if he was able to go to the clinic today at [place] and pick up his medications.  He states, "no".  Asked how he will get to [employer] and he states "I  guess a cab".     Requested benefit check.  Lantus/Levemir and Humalog and Novolog are covered.   MD, Please send prescriptions to [facility] for delivery of medications to bedside prior to DC.    Please order-  Glucometer Order # [id number] Lantus solostar insulin pen Order # [id number] Tobie Lords Order # [id number] Insulin Pen Needles Order # [id number]  Will speak to patient again this afternoon.  Addendum_0 :  Patient successfully return demonstrated the insulin pen.  He is aware to keep insulins in the refrigerator at the shelter.  He can have the 2 pens that he is using with him, un-refrigerated for up to 28 days.  Educated him on site rotation and areas he can administer insulin.  He was able to explain hypoglycemia, signs, symptoms and treatments.    Will continue to follow while inpatient.  Thank you, Reche Dixon, MSN, [place] Diabetes Coordinator Inpatient Diabetes Program [phone] (team pager from 8a-5p)

## 2021-07-26 NOTE — ED Triage Notes (Signed)
Patient arrives to ED via EMS from [facility] shelter, [facility], c/o shortness of breath and dizziness. Patient is A&O x 4. Respirations are even and unlabored. Patient is 96% on room air. Patient denies any pain, states he got overheated today.

## 2021-07-26 NOTE — TOC Progression Note (Signed)
Transition of Care [facility]) - Progression Note    Patient Details  Name: Raymond Hood MRN: [id number] Date of Birth: 16-Feb-1970  [facility] CM/SW Contact  Lorri Frederick, LCSW Phone Number: 07/26/2021, 4:08 PM  Clinical Narrative:   CSW attempted to call Lorin Picket, Case mgr at [facility], left message regarding pt DC.  CSW did speak with Areta Haber, front desk at [facility].  She reports they are ready to receive pt, no specific needs.  CSW informed her pt will have DC summary and will arrive by cab.     Expected Discharge Plan: Skilled Nursing Facility Barriers to Discharge: Continued Medical Work up  Expected Discharge Plan and Services Expected Discharge Plan: Skilled Nursing Facility   Discharge Planning Services: CM Consult   Living arrangements for the past 2 months: Homeless Shelter (since Nov. 2022) Expected Discharge Date: 07/26/21                                     Social Determinants of Health (SDOH) Interventions    Readmission Risk Interventions     No data to display

## 2021-07-26 NOTE — Telephone Encounter (Signed)
Pharmacy Patient Advocate Encounter  Insurance verification completed.    The patient is insured through [employer]   The patient is currently admitted and ran test claims for the following: Humalog, Novolog, Basaglar, Lantus, Levemir.  Copays and coinsurance results were relayed to Inpatient clinical team.

## 2021-07-26 NOTE — TOC Transition Note (Signed)
Transition of Care [facility]) - CM/SW Discharge Note   Patient Details  Name: Raymond Hood MRN: [id number] Date of Birth: [birth year]  Transition of Care [facility]) CM/SW Contact:  Bess Kinds, RN Phone Number: [phone] 07/26/2021, 1:41 PM   Clinical Narrative:     Patient to transition back to [facility] today. Diabetes coordinator and nursing to assist with insulin education and self monitoring. Patient uninsured. Has not uses MATCH. MATCH completed with zero copay. TOC pharmacy to fill dc prescriptions and deliver to patient room. Will plan on taxi voucher for transportation back to [facility].     Barriers to Discharge: Continued Medical Work up   Patient Goals and CMS Choice        Discharge Placement                       Discharge Plan and Services   Discharge Planning Services: CM Consult                                 Social Determinants of Health (SDOH) Interventions     Readmission Risk Interventions     No data to display

## 2021-07-27 ENCOUNTER — Other Ambulatory Visit (HOSPITAL_COMMUNITY): Payer: Self-pay

## 2021-07-27 ENCOUNTER — Encounter (HOSPITAL_COMMUNITY): Payer: Self-pay | Admitting: Internal Medicine

## 2021-07-27 DIAGNOSIS — J441 Chronic obstructive pulmonary disease with (acute) exacerbation: Secondary | ICD-10-CM

## 2021-07-27 DIAGNOSIS — N179 Acute kidney failure, unspecified: Secondary | ICD-10-CM | POA: Diagnosis not present

## 2021-07-27 DIAGNOSIS — G4733 Obstructive sleep apnea (adult) (pediatric): Secondary | ICD-10-CM

## 2021-07-27 DIAGNOSIS — Z794 Long term (current) use of insulin: Secondary | ICD-10-CM

## 2021-07-27 DIAGNOSIS — K219 Gastro-esophageal reflux disease without esophagitis: Secondary | ICD-10-CM

## 2021-07-27 DIAGNOSIS — E1165 Type 2 diabetes mellitus with hyperglycemia: Secondary | ICD-10-CM

## 2021-07-27 DIAGNOSIS — I5032 Chronic diastolic (congestive) heart failure: Secondary | ICD-10-CM

## 2021-07-27 DIAGNOSIS — F259 Schizoaffective disorder, unspecified: Secondary | ICD-10-CM

## 2021-07-27 HISTORY — DX: Acute kidney failure, unspecified: N17.9

## 2021-07-27 LAB — HEMOGLOBIN A1C
Hgb A1c MFr Bld: 8.9 % — ABNORMAL HIGH (ref 4.8–5.6)
Mean Plasma Glucose: 208.73 mg/dL

## 2021-07-27 LAB — URINALYSIS, ROUTINE W REFLEX MICROSCOPIC
Bacteria, UA: NONE SEEN
Bilirubin Urine: NEGATIVE
Glucose, UA: >=500 mg/dL — AB
Hgb urine dipstick: NEGATIVE
Ketones, ur: NEGATIVE mg/dL
Leukocytes,Ua: NEGATIVE
Nitrite: NEGATIVE
Protein, ur: NEGATIVE mg/dL
Specific Gravity, Urine: 1.009 (ref 1.005–1.030)
pH: 6 (ref 5.0–8.0)

## 2021-07-27 LAB — CBC WITH DIFFERENTIAL/PLATELET
Abs Immature Granulocytes: 0.14 10*3/uL — ABNORMAL HIGH (ref 0.00–0.07)
Basophils Absolute: 0.1 10*3/uL (ref 0.0–0.1)
Basophils Relative: 1 %
Eosinophils Absolute: 0.3 10*3/uL (ref 0.0–0.5)
Eosinophils Relative: 2 %
HCT: 50.8 % (ref 39.0–52.0)
Hemoglobin: 16.1 g/dL (ref 13.0–17.0)
Immature Granulocytes: 1 %
Lymphocytes Relative: 13 %
Lymphs Abs: 1.6 10*3/uL (ref 0.7–4.0)
MCH: 26.9 pg (ref 26.0–34.0)
MCHC: 31.7 g/dL (ref 30.0–36.0)
MCV: 84.9 fL (ref 80.0–100.0)
Monocytes Absolute: 1 10*3/uL (ref 0.1–1.0)
Monocytes Relative: 8 %
Neutro Abs: 9.4 10*3/uL — ABNORMAL HIGH (ref 1.7–7.7)
Neutrophils Relative %: 75 %
Platelets: 202 10*3/uL (ref 150–400)
RBC: 5.98 MIL/uL — ABNORMAL HIGH (ref 4.22–5.81)
RDW: 16.2 % — ABNORMAL HIGH (ref 11.5–15.5)
WBC: 12.4 10*3/uL — ABNORMAL HIGH (ref 4.0–10.5)
nRBC: 0 % (ref 0.0–0.2)

## 2021-07-27 LAB — BRAIN NATRIURETIC PEPTIDE: B Natriuretic Peptide: 13.9 pg/mL (ref 0.0–100.0)

## 2021-07-27 LAB — RAPID URINE DRUG SCREEN, HOSP PERFORMED
Amphetamines: NOT DETECTED
Barbiturates: NOT DETECTED
Benzodiazepines: NOT DETECTED
Cocaine: NOT DETECTED
Opiates: NOT DETECTED
Tetrahydrocannabinol: NOT DETECTED

## 2021-07-27 LAB — COMPREHENSIVE METABOLIC PANEL
ALT: 22 U/L (ref 0–44)
AST: 16 U/L (ref 15–41)
Albumin: 2.7 g/dL — ABNORMAL LOW (ref 3.5–5.0)
Alkaline Phosphatase: 60 U/L (ref 38–126)
Anion gap: 9 (ref 5–15)
BUN: 13 mg/dL (ref 6–20)
CO2: 28 mmol/L (ref 22–32)
Calcium: 8.2 mg/dL — ABNORMAL LOW (ref 8.9–10.3)
Chloride: 103 mmol/L (ref 98–111)
Creatinine, Ser: 0.95 mg/dL (ref 0.61–1.24)
GFR, Estimated: >60 mL/min (ref >60)
Glucose, Bld: 202 mg/dL — ABNORMAL HIGH (ref 70–99)
Potassium: 4.2 mmol/L (ref 3.5–5.1)
Sodium: 140 mmol/L (ref 135–145)
Total Bilirubin: 0.6 mg/dL (ref 0.3–1.2)
Total Protein: 5.7 g/dL — ABNORMAL LOW (ref 6.5–8.1)

## 2021-07-27 LAB — BLOOD GAS, VENOUS
Acid-Base Excess: 6 mmol/L — ABNORMAL HIGH (ref 0.0–2.0)
Bicarbonate: 33 mmol/L — ABNORMAL HIGH (ref 20.0–28.0)
O2 Saturation: 78.5 %
Patient temperature: 37
pCO2, Ven: 57 mmHg (ref 44–60)
pH, Ven: 7.37 (ref 7.25–7.43)
pO2, Ven: 49 mmHg — ABNORMAL HIGH (ref 32–45)

## 2021-07-27 LAB — TSH: TSH: 1.888 u[IU]/mL (ref 0.350–4.500)

## 2021-07-27 LAB — CBG MONITORING, ED
Glucose-Capillary: 198 mg/dL — ABNORMAL HIGH (ref 70–99)
Glucose-Capillary: 195 mg/dL — ABNORMAL HIGH (ref 70–99)
Glucose-Capillary: 239 mg/dL — ABNORMAL HIGH (ref 70–99)

## 2021-07-27 LAB — GLUCOSE, CAPILLARY: Glucose-Capillary: 188 mg/dL — ABNORMAL HIGH (ref 70–99)

## 2021-07-27 LAB — TROPONIN I (HIGH SENSITIVITY): Troponin I (High Sensitivity): 9 ng/L (ref <18)

## 2021-07-27 MED ORDER — ALBUTEROL SULFATE (2.5 MG/3ML) 0.083% IN NEBU
2.5000 mg | INHALATION_SOLUTION | Freq: Four times a day (QID) | RESPIRATORY_TRACT | Status: DC | PRN
Start: 2021-07-27 — End: 2021-07-27

## 2021-07-27 MED ORDER — ENOXAPARIN SODIUM 40 MG/0.4ML IJ SOSY
40.0000 mg | PREFILLED_SYRINGE | INTRAMUSCULAR | Status: DC
  Administered 2021-07-27: 40 mg via SUBCUTANEOUS
  Filled 2021-07-27: qty 0.4

## 2021-07-27 MED ORDER — CIPROFLOXACIN HCL 0.3 % OP SOLN
1.0000 [drp] | OPHTHALMIC | Status: DC
Start: 2021-07-27 — End: 2021-07-28
  Filled 2021-07-27 (×2): qty 2.5

## 2021-07-27 MED ORDER — POLYETHYLENE GLYCOL 3350 17 G PO PACK: 17.0000 g | PACK | Freq: Every day | ORAL | Status: DC | PRN

## 2021-07-27 MED ORDER — ONDANSETRON HCL 4 MG PO TABS: 4.0000 mg | ORAL_TABLET | Freq: Four times a day (QID) | ORAL | Status: DC | PRN

## 2021-07-27 MED ORDER — IPRATROPIUM-ALBUTEROL 0.5-2.5 (3) MG/3ML IN SOLN: 3.0000 mL | RESPIRATORY_TRACT | Status: DC | PRN

## 2021-07-27 MED ORDER — TAMSULOSIN HCL 0.4 MG PO CAPS
0.4000 mg | ORAL_CAPSULE | Freq: Every day | ORAL | Status: DC
  Administered 2021-07-27 – 2021-07-30 (×4): 0.4 mg via ORAL
  Filled 2021-07-27 (×4): qty 1

## 2021-07-27 MED ORDER — PREDNISONE 50 MG PO TABS
50.0000 mg | ORAL_TABLET | Freq: Every day | ORAL | Status: DC
  Administered 2021-07-27 – 2021-07-30 (×4): 50 mg via ORAL
  Filled 2021-07-27 (×2): qty 1
  Filled 2021-07-27: qty 2
  Filled 2021-07-27: qty 1

## 2021-07-27 MED ORDER — DULOXETINE HCL 60 MG PO CPEP
60.0000 mg | ORAL_CAPSULE | Freq: Every day | ORAL | Status: DC
  Administered 2021-07-27 – 2021-07-30 (×4): 60 mg via ORAL
  Filled 2021-07-27 (×2): qty 1
  Filled 2021-07-27: qty 2
  Filled 2021-07-27: qty 1

## 2021-07-27 MED ORDER — FLUTICASONE FUROATE-VILANTEROL 200-25 MCG/ACT IN AEPB
1.0000 | INHALATION_SPRAY | Freq: Every day | RESPIRATORY_TRACT | Status: DC
  Administered 2021-07-27 – 2021-07-30 (×2): 1 via RESPIRATORY_TRACT
  Filled 2021-07-27 (×2): qty 28

## 2021-07-27 MED ORDER — ACETAMINOPHEN 325 MG PO TABS
650.0000 mg | ORAL_TABLET | Freq: Four times a day (QID) | ORAL | Status: DC | PRN
  Administered 2021-07-27 – 2021-07-30 (×6): 650 mg via ORAL
  Filled 2021-07-27 (×6): qty 2

## 2021-07-27 MED ORDER — ONDANSETRON HCL 4 MG/2ML IJ SOLN: 4.0000 mg | Freq: Four times a day (QID) | INTRAMUSCULAR | Status: DC | PRN

## 2021-07-27 MED ORDER — GABAPENTIN 300 MG PO CAPS
600.0000 mg | ORAL_CAPSULE | Freq: Every day | ORAL | Status: DC
  Administered 2021-07-27 – 2021-07-29 (×3): 600 mg via ORAL
  Filled 2021-07-27 (×3): qty 2

## 2021-07-27 MED ORDER — INSULIN GLARGINE-YFGN 100 UNIT/ML ~~LOC~~ SOLN
35.0000 [IU] | Freq: Every day | SUBCUTANEOUS | Status: DC
  Administered 2021-07-27 – 2021-07-29 (×3): 35 [IU] via SUBCUTANEOUS
  Filled 2021-07-27 (×4): qty 0.35

## 2021-07-27 MED ORDER — GUAIFENESIN 100 MG/5ML PO LIQD: 5.0000 mL | ORAL | Status: DC | PRN

## 2021-07-27 MED ORDER — INSULIN ASPART 100 UNIT/ML IJ SOLN
12.0000 [IU] | Freq: Three times a day (TID) | INTRAMUSCULAR | Status: DC
Start: 2021-07-27 — End: 2021-07-29
  Administered 2021-07-27 – 2021-07-29 (×8): 12 [IU] via SUBCUTANEOUS

## 2021-07-27 MED ORDER — IPRATROPIUM-ALBUTEROL 0.5-2.5 (3) MG/3ML IN SOLN
3.0000 mL | Freq: Once | RESPIRATORY_TRACT | Status: AC
  Administered 2021-07-27: 3 mL via RESPIRATORY_TRACT
  Filled 2021-07-27: qty 3

## 2021-07-27 MED ORDER — INSULIN ASPART 100 UNIT/ML IJ SOLN
0.0000 [IU] | Freq: Three times a day (TID) | INTRAMUSCULAR | Status: DC
  Administered 2021-07-27: 5 [IU] via SUBCUTANEOUS
  Administered 2021-07-27 (×2): 3 [IU] via SUBCUTANEOUS
  Administered 2021-07-28 (×3): 8 [IU] via SUBCUTANEOUS
  Administered 2021-07-28 – 2021-07-29 (×3): 3 [IU] via SUBCUTANEOUS
  Administered 2021-07-29 – 2021-07-30 (×3): 11 [IU] via SUBCUTANEOUS
  Administered 2021-07-30: 3 [IU] via SUBCUTANEOUS
  Administered 2021-07-30: 2 [IU] via SUBCUTANEOUS

## 2021-07-27 MED ORDER — METOPROLOL TARTRATE 5 MG/5ML IV SOLN: 5.0000 mg | INTRAVENOUS | Status: DC | PRN

## 2021-07-27 MED ORDER — FAMOTIDINE 20 MG PO TABS
20.0000 mg | ORAL_TABLET | Freq: Two times a day (BID) | ORAL | Status: DC
  Administered 2021-07-27 – 2021-07-30 (×7): 20 mg via ORAL
  Filled 2021-07-27 (×7): qty 1

## 2021-07-27 MED ORDER — RISPERIDONE 2 MG PO TABS
4.0000 mg | ORAL_TABLET | Freq: Every day | ORAL | Status: DC
  Administered 2021-07-27 – 2021-07-30 (×4): 4 mg via ORAL
  Filled 2021-07-27 (×4): qty 2

## 2021-07-27 MED ORDER — GABAPENTIN 300 MG PO CAPS
300.0000 mg | ORAL_CAPSULE | Freq: Every day | ORAL | Status: DC
Start: 2021-07-27 — End: 2021-07-27

## 2021-07-27 MED ORDER — GABAPENTIN 300 MG PO CAPS
600.0000 mg | ORAL_CAPSULE | Freq: Every morning | ORAL | Status: DC
  Administered 2021-07-27 – 2021-07-30 (×4): 600 mg via ORAL
  Filled 2021-07-27 (×4): qty 2

## 2021-07-27 MED ORDER — ATORVASTATIN CALCIUM 10 MG PO TABS
10.0000 mg | ORAL_TABLET | Freq: Every day | ORAL | Status: DC
  Administered 2021-07-27 – 2021-07-30 (×4): 10 mg via ORAL
  Filled 2021-07-27 (×4): qty 1

## 2021-07-27 MED ORDER — OLOPATADINE HCL 0.1 % OP SOLN
1.0000 [drp] | Freq: Two times a day (BID) | OPHTHALMIC | Status: DC
Start: 2021-07-27 — End: 2021-07-28
  Filled 2021-07-27: qty 5

## 2021-07-27 MED ORDER — HYDRALAZINE HCL 20 MG/ML IJ SOLN: 10.0000 mg | INTRAMUSCULAR | Status: DC | PRN

## 2021-07-27 MED ORDER — HYDROXYZINE HCL 25 MG PO TABS
25.0000 mg | ORAL_TABLET | Freq: Three times a day (TID) | ORAL | Status: DC | PRN
  Administered 2021-07-27: 25 mg via ORAL
  Filled 2021-07-27: qty 1

## 2021-07-27 MED ORDER — FLUTICASONE PROPIONATE 50 MCG/ACT NA SUSP
2.0000 | Freq: Every day | NASAL | Status: DC
Start: 2021-07-27 — End: 2021-07-30
  Filled 2021-07-27 (×2): qty 16

## 2021-07-27 MED ORDER — TRAZODONE HCL 50 MG PO TABS
50.0000 mg | ORAL_TABLET | Freq: Every evening | ORAL | Status: DC | PRN
  Administered 2021-07-27 – 2021-07-28 (×2): 50 mg via ORAL
  Filled 2021-07-27 (×2): qty 1

## 2021-07-27 MED ORDER — LACTATED RINGERS IV BOLUS
1000.0000 mL | Freq: Once | INTRAVENOUS | Status: AC
  Administered 2021-07-27: 1000 mL via INTRAVENOUS

## 2021-07-27 MED ORDER — IPRATROPIUM-ALBUTEROL 0.5-2.5 (3) MG/3ML IN SOLN
3.0000 mL | Freq: Four times a day (QID) | RESPIRATORY_TRACT | Status: DC
  Administered 2021-07-27 – 2021-07-29 (×9): 3 mL via RESPIRATORY_TRACT
  Filled 2021-07-27 (×9): qty 3

## 2021-07-27 MED ORDER — ENOXAPARIN SODIUM 80 MG/0.8ML IJ SOSY
80.0000 mg | PREFILLED_SYRINGE | INTRAMUSCULAR | Status: DC
  Administered 2021-07-28 – 2021-07-30 (×3): 80 mg via SUBCUTANEOUS
  Filled 2021-07-27 (×3): qty 0.8

## 2021-07-27 MED ORDER — SENNOSIDES-DOCUSATE SODIUM 8.6-50 MG PO TABS: 1.0000 | ORAL_TABLET | Freq: Every evening | ORAL | Status: DC | PRN

## 2021-07-27 MED ORDER — GABAPENTIN 300 MG PO CAPS
300.0000 mg | ORAL_CAPSULE | Freq: Every day | ORAL | Status: DC
  Administered 2021-07-27 – 2021-07-30 (×4): 300 mg via ORAL
  Filled 2021-07-27 (×4): qty 1

## 2021-07-27 MED ORDER — LACTATED RINGERS IV SOLN: INTRAVENOUS | Status: AC

## 2021-07-27 MED ORDER — ACETAMINOPHEN 650 MG RE SUPP: 650.0000 mg | Freq: Four times a day (QID) | RECTAL | Status: DC | PRN

## 2021-07-27 NOTE — Assessment & Plan Note (Signed)
   Supplemental oxygen with sleep

## 2021-07-27 NOTE — ED Notes (Signed)
Lab called stating that they couldn't run the bmp because it was hemolyzed. The blood was drew 2 hours ago and lab stated that they have been back up and it will need to be redrawn

## 2021-07-27 NOTE — Evaluation (Signed)
Physical Therapy Evaluation Patient Details Name: Raymond Hood MRN: [id number] DOB: December 01, 1970 Today's Date: 07/27/2021  History of Present Illness  Pt is a 51 y/o male admitted secondary to SOB and dizzines on 7/17. Pt recently discharged from hospital on 7/17 secondary to PNA. PMH includes COPD, schizoaffective disorder, DM, dCHF, and nicotine dependence.  Clinical Impression  Pt admitted secondary to problem above with deficits below. Pt requiring min to min guard to perform bed mobility tasks and stand and take side steps at EOB. Pt reporting lightheadedness with transfers. BP dropped from 116/72 in sitting to 98/80 following ambulation. Anticipate pt will progress well once symptoms improve. Will continue to follow acutely and progress mobility according to pt tolerance.        Recommendations for follow up therapy are one component of a multi-disciplinary discharge planning process, led by the attending physician.  Recommendations may be updated based on patient status, additional functional criteria and insurance authorization.  Follow Up Recommendations Other (comment) (HHPT vs no PT follow up Pending progression)      Assistance Recommended at Discharge Frequent or constant Supervision/Assistance  Patient can return home with the following  A little help with bathing/dressing/bathroom;Assist for transportation    Equipment Recommendations Rollator (4 wheels)  Recommendations for Other Services       Functional Status Assessment Patient has had a recent decline in their functional status and demonstrates the ability to make significant improvements in function in a reasonable and predictable amount of time.     Precautions / Restrictions Precautions Precautions: Fall Restrictions Weight Bearing Restrictions: No      Mobility  Bed Mobility Overal bed mobility: Needs Assistance Bed Mobility: Supine to Sit     Supine to sit: Min assist     General bed mobility  comments: min A for trunk assist. Increased time and effort    Transfers Overall transfer level: Needs assistance Equipment used: None Transfers: Sit to/from Stand Sit to Stand: Min guard           General transfer comment: Min guard for safety. Able to take side steps at EOB with min guard A, however, pt reporting increased light headedness. BP dropped from 116/72 in sitting to 98/80 following ambulation.    Ambulation/Gait                  Stairs            Wheelchair Mobility    Modified Rankin (Stroke Patients Only)       Balance Overall balance assessment: Mild deficits observed, not formally tested                                           Pertinent Vitals/Pain Pain Assessment Pain Assessment: Faces Faces Pain Scale: Hurts a little bit Pain Location: low back Pain Descriptors / Indicators: Discomfort Pain Intervention(s): Limited activity within patient's tolerance, Monitored during session, Repositioned    Home Living Family/patient expects to be discharged to:: Shelter/Homeless                   Additional Comments: Currently at [facility] shelter    Prior Function Prior Level of Function : Independent/Modified Independent             Mobility Comments: Uses cane for mobility for short distances.       Hand Dominance  Extremity/Trunk Assessment   Upper Extremity Assessment Upper Extremity Assessment: Defer to OT evaluation    Lower Extremity Assessment Lower Extremity Assessment: Generalized weakness    Cervical / Trunk Assessment Cervical / Trunk Assessment: Other exceptions Cervical / Trunk Exceptions: increased body habitus  Communication   Communication: No difficulties  Cognition Arousal/Alertness: Awake/alert Behavior During Therapy: Flat affect Overall Cognitive Status: No family/caregiver present to determine baseline cognitive functioning                                  General Comments: hx of schizoaffective/bipolar disorder. flat affect but follows directions consistently.        General Comments      Exercises     Assessment/Plan    PT Assessment Patient needs continued PT services  PT Problem List Decreased strength;Decreased activity tolerance;Decreased balance;Decreased mobility;Cardiopulmonary status limiting activity       PT Treatment Interventions DME instruction;Gait training;Therapeutic activities;Functional mobility training;Therapeutic exercise;Balance training;Patient/family education    PT Goals (Current goals can be found in the Care Plan section)  Acute Rehab PT Goals Patient Stated Goal: to get better PT Goal Formulation: With patient Time For Goal Achievement: 08/10/21 Potential to Achieve Goals: Good    Frequency Min 3X/week     Co-evaluation               AM-PAC PT "6 Clicks" Mobility  Outcome Measure Help needed turning from your back to your side while in a flat bed without using bedrails?: None Help needed moving from lying on your back to sitting on the side of a flat bed without using bedrails?: A Little Help needed moving to and from a bed to a chair (including a wheelchair)?: A Little Help needed standing up from a chair using your arms (e.g., wheelchair or bedside chair)?: A Little Help needed to walk in hospital room?: A Little Help needed climbing 3-5 steps with a railing? : A Little 6 Click Score: 19    End of Session   Activity Tolerance: Treatment limited secondary to medical complications (Comment) (low BP) Patient left: in bed;with call bell/phone within reach (on stretcher in ED) Nurse Communication: Mobility status PT Visit Diagnosis: Other abnormalities of gait and mobility (R26.89);Unsteadiness on feet (R26.81);Muscle weakness (generalized) (M62.81)    Time: 1050-1105 PT Time Calculation (min) (ACUTE ONLY): 15 min   Charges:   PT Evaluation $PT Eval Moderate  Complexity: 1 Mod          Farley Ly, PT, DPT  Acute Rehabilitation Services  Office: [phone]   [facility] 07/27/2021, 1:13 PM

## 2021-07-27 NOTE — Progress Notes (Signed)
PROGRESS NOTE    Raymond Hood  EPP:[id number] DOB: 11/16/1970 DOA: 07/26/2021 PCP: Storm Frisk, MD   Brief Narrative:  51 year old with history of COPD, OSA, diastolic CHF, HTN, DM2, schizoaffective disorder, anxiety, nicotine dependence came to the hospital with shortness of breath and dizziness.  Patient was admitted at [facility] from 7/5 - 7/17 for acute hypoxia of from pneumonia, COPD and CHF exacerbation which was treated with bronchodilators, steroids and diuretics.  About 25 L of fluid was taken off.  After being discharged patient went to [facility] where he was outside in the heat for several hours thereafter came to the hospital.  Upon admission noted to have acute kidney injury with creatinine 1.44, baseline 0.7.   Assessment & Plan:  Principal Problem:   AKI (acute kidney injury) (HCC) Active Problems:   COPD with acute exacerbation (HCC)   Chronic diastolic heart failure (HCC)   Type 2 diabetes mellitus with hyperglycemia, with long-term current use of insulin (HCC)   GERD (gastroesophageal reflux disease)   Schizo affective schizophrenia (HCC)   OSA/OHS     Assessment and Plan: * AKI (acute kidney injury) (HCC) -Secondary to volume depletion.  Baseline creatinine 0.7, admission creatinine 1.44.  IV fluids.  Avoid nephrotoxic drugs.  COPD with acute exacerbation (HCC) -Bronchodilators, prednisone  Chronic diastolic heart failure (HCC) -BNP is normal  Type 2 diabetes mellitus with hyperglycemia, with long-term current use of insulin (HCC) Peripheral neuropathy -A1c 8.9.  Sliding scale and Accu-Cheks.  Lantus 35 units daily. -Gabapentin  GERD (gastroesophageal reflux disease) -Pepcid  Schizo affective schizophrenia (HCC) -On home meds-Cymbalta, risperidone  OSA/OHS -Supplemental oxygen        DVT prophylaxis: Lovenox Code Status: Full code Family Communication:    Status is: Observation The patient remains OBS appropriate and will d/c before 2  midnights.  Patient being admitted for dehydration, AKI.  Needs PT/OT.  A.m. labs pending.   Nutritional status          Body mass index is 58.59 kg/m.         Subjective: Seen and examined in the ED, no complaints except feels very weak.   Examination: Constitutional: Not in acute distress, very poor hygiene.  Morbidly obese. Respiratory: Bilateral diminished breath sounds Cardiovascular: Normal sinus rhythm, no rubs Abdomen: Nontender nondistended good bowel sounds Musculoskeletal: No edema noted Skin: Some skin abrasion noted of his bilateral knee Neurologic: CN 2-12 grossly intact.  And nonfocal Psychiatric: Normal judgment and insight. Alert and oriented x 3. Normal mood.  Objective: Vitals:   07/27/21 0430 07/27/21 0500 07/27/21 0625 07/27/21 0630  BP: 111/70 118/76  127/85  Pulse: (!) 111 (!) 112  (!) 108  Resp: 19 16  16   Temp:   97.9 F (36.6 C)   TempSrc:   Oral   SpO2: 97% 94%  98%  Weight:      Height:        Intake/Output Summary (Last 24 hours) at 07/27/2021 0828 Last data filed at 07/27/2021 0508 Gross per 24 hour  Intake --  Output 900 ml  Net -900 ml   Filed Weights   07/26/21 2050  Weight: (!) 174.8 kg     Data Reviewed:   CBC: Recent Labs  Lab 07/26/21 2237  WBC 15.0*  NEUTROABS 11.9*  HGB 15.2  HCT 50.0  MCV 86.1  PLT 209   Basic Metabolic Panel: Recent Labs  Lab 07/21/21 0246 07/22/21 0211 07/23/21 0234 07/24/21 0201 07/26/21 2237  NA 138 137  137 138 136  K 3.5 3.1* 3.5 3.8 4.2  CL 94* 95* 96* 97* 98  CO2 33* 33* 30 30 27   GLUCOSE 165* 160* 158* 142* 227*  BUN 31* 27* 28* 23* 19  CREATININE 0.91 0.91 0.92 0.72 1.44*  CALCIUM 8.7* 8.7* 8.4* 8.6* 8.5*   GFR: Estimated Creatinine Clearance: 98.5 mL/min (A) (by C-G formula based on SCr of 1.44 mg/dL (H)). Liver Function Tests: No results for input(s): "AST", "ALT", "ALKPHOS", "BILITOT", "PROT", "ALBUMIN" in the last 168 hours. No results for input(s):  "LIPASE", "AMYLASE" in the last 168 hours. No results for input(s): "AMMONIA" in the last 168 hours. Coagulation Profile: No results for input(s): "INR", "PROTIME" in the last 168 hours. Cardiac Enzymes: Recent Labs  Lab 07/26/21 2237  CKTOTAL 27*   BNP (last 3 results) No results for input(s): "PROBNP" in the last 8760 hours. HbA1C: Recent Labs    07/27/21 0625  HGBA1C 8.9*   CBG: Recent Labs  Lab 07/25/21 1649 07/25/21 2053 07/26/21 0550 07/26/21 1148 07/26/21 1624  GLUCAP 157* 121* 163* 137* 147*   Lipid Profile: No results for input(s): "CHOL", "HDL", "LDLCALC", "TRIG", "CHOLHDL", "LDLDIRECT" in the last 72 hours. Thyroid Function Tests: No results for input(s): "TSH", "T4TOTAL", "FREET4", "T3FREE", "THYROIDAB" in the last 72 hours. Anemia Panel: No results for input(s): "VITAMINB12", "FOLATE", "FERRITIN", "TIBC", "IRON", "RETICCTPCT" in the last 72 hours. Sepsis Labs: No results for input(s): "PROCALCITON", "LATICACIDVEN" in the last 168 hours.  No results found for this or any previous visit (from the past 240 hour(s)).       Radiology Studies: DG Chest Portable 1 View  Result Date: 07/26/2021 CLINICAL DATA:  Dyspnea EXAM: PORTABLE CHEST 1 VIEW COMPARISON:  07/15/2021 FINDINGS: Lungs are clear. No pneumothorax or pleural effusion. Cardiac size is mildly enlarged, stable since prior examination. Widening of the right paratracheal soft tissues relates to vascular shadow and prominent mediastinal fat better seen on CT examination of 10/20/2020. Pulmonary vascularity is normal. No acute bone abnormality. IMPRESSION: No active disease. Electronically Signed   By: 12/20/2020 M.D.   On: 07/26/2021 22:18        Scheduled Meds:  atorvastatin  10 mg Oral Daily   ciprofloxacin  1 drop Right Eye Q4H while awake   DULoxetine  60 mg Oral Daily   enoxaparin (LOVENOX) injection  40 mg Subcutaneous Q24H   famotidine  20 mg Oral BID   fluticasone  2 spray Each Nare  Daily   fluticasone furoate-vilanterol  1 puff Inhalation Daily   gabapentin  300 mg Oral Q lunch   gabapentin  300 mg Oral QHS   gabapentin  600 mg Oral q AM   insulin aspart  0-15 Units Subcutaneous TID AC & HS   insulin aspart  12 Units Subcutaneous TID WC   insulin glargine-yfgn  35 Units Subcutaneous Daily   ipratropium-albuterol  3 mL Nebulization Q6H   olopatadine  1 drop Right Eye BID   predniSONE  50 mg Oral Q breakfast   risperidone  4 mg Oral Daily   tamsulosin  0.4 mg Oral Daily   Continuous Infusions:  lactated ringers 125 mL/hr at 07/27/21 0508     LOS: 0 days   Time spent= 35 mins    Stella Encarnacion 07/29/21, MD [facility]  If 7PM-7AM, please contact night-coverage  07/27/2021, 8:28 AM

## 2021-07-27 NOTE — ED Provider Notes (Signed)
  Physical Exam  BP 121/75   Pulse (!) 117   Temp (!) 97.5 F (36.4 C) (Oral)   Resp 17   Ht 5\' 8"  (1.727 m)   Wt (!) 174.8 kg   SpO2 95%   BMI 58.59 kg/m   Physical Exam  Procedures  Procedures  ED Course / MDM    Medical Decision Making Amount and/or Complexity of Data Reviewed Labs: ordered.    Details: CBC with leukocytosis of 15,000, BMP unfortunately with AKI with creatinine of 1.  4, doubled from patient's creatinine of 0.7 prior to discharge from admission.  Troponin is normal, CK unremarkable, BNP is normal. Radiology: ordered.    Details: Chest x-ray improved from prior without acute active pulmonary disease.  . ECG/medicine tests:     Details: EKG with sinus tach, no STEMI  Risk Prescription drug management. Decision regarding hospitalization.   Care of the patient assumed from preceding ED provider , PA-C at time of shift change.  Please see her associated note for further insight and the patient's ED course.  In brief patient is a Homeless gentleman who presents from [facility] with concern for shortness of breath.  Patient was discharged earlier in the day following 12-day admission to the hospital for pneumonia, CHF with hypoxia. diruesis with ~25 L of throughout admisstion.   States that today he was sitting outside became very hot, diaphoretic, and short of breath.  No chest pain.  Tachycardic, with wheezing on exam per preceding ED provider; no suspicion on intake for heat injury.  At time of shift change patient is awaiting completion of laboratory studies.  Receiving fluid bolus due to tachycardia..  Tachycardia mildly improved to heart rate of 110 at time my evaluation, patient states that shortness of breath was initially improved after DuoNeb, states he feels he would benefit from another.  We will proceed with this.  Given new AKI in context of recent extensive diuresis, do feel patient would benefit from admission to the hospital for  further stabilization, particularly given his social status with homelessness and difficulty securing medications.  Consult to hospitalist Dr. [employer] (0150 am) who is agreeable to admitting this patient to his service.  Appreciate his collaboration in care of this patient.  This chart was dictated using voice recognition software, Dragon. Despite the best efforts of this provider to proofread and correct errors, errors may still occur which can change documentation meaning.     Martyn Malay, PA-C 07/27/21 0151    07/29/21, MD 07/27/21 [phone]

## 2021-07-27 NOTE — Assessment & Plan Note (Signed)
.   Patient exhibiting evidence of acute kidney injury secondary to volume depletion . Creatinine is currently 1.44 an increase compared to baseline of 0.72 . Hydrating patient with intravenous isotonic fluids. . Strict input and output monitoring . Monitoring renal function and electrolytes with serial chemistries . Avoiding nephrotoxic agents if at all possible

## 2021-07-27 NOTE — Assessment & Plan Note (Signed)
No clinical evidence of cardiogenic volume overload Strict input and output monitoring Daily weights Low-sodium diet  

## 2021-07-27 NOTE — Assessment & Plan Note (Signed)
?   Continue home regimen of famotidine. ?

## 2021-07-27 NOTE — Assessment & Plan Note (Signed)
   Continue home regimen of psychotropic regimen

## 2021-07-27 NOTE — Assessment & Plan Note (Signed)
.   Continue home regimen of insulin therapy . Patient been placed on Accu-Cheks before every meal and nightly with sliding scale insulin . Holding home regimen of oral hypoglycemics . Hemoglobin A1C ordered . Diabetic Diet

## 2021-07-27 NOTE — H&P (Signed)
History and Physical    Patient: Raymond Hood MRN: [id number] DOA: 07/26/2021  Date of Service: the patient was seen and examined on 07/27/2021  Patient coming from: Home  Chief Complaint:  Chief Complaint  Patient presents with   Dizziness   Shortness of Breath    HPI:   51 year old male with past medical history of COPD, obstructive sleep apnea, diastolic congestive heart failure, hypertension, diabetes mellitus type 2, schizoaffective disorder, nicotine dependence, anxiety disorder who presents to [facility] emergency department due to complaints of shortness of breath and dizziness.  Of note, patient was recently hospitalized at [facility] from 7/5 until 7/17.  Patient initially presented with shortness of breath found to be in acute hypoxic respiratory failure thought to be multifactorial secondary to pneumonia, COPD exacerbation and acute congestive heart failure.  Patient was initially managed with BiPAP therapy then high flow oxygen delivery.  Patient was treated with antibiotics bronchodilator therapy steroids and diuretics with approximate 25 L of fluid diuresed off the patient throughout the course of the hospital stay.  Patient was eventually weaned off of supplemental oxygen prior to being discharged on 7/17.  Patient explains [facility].  He explains that earlier in the day on 7/17 he spent the vast majority of the day outside in the heat.  As the day progressed patient began to experience recurrent shortness of breath.  Shortness of breath is severe in intensity, worse with exertion and improved with rest.  Patient denies any associated cough or fever.  Patient denies any associated chest pain.  Patient did continue to experience persisting symptoms throughout the day prompting the patient to come into [facility] emergency department for evaluation.  Upon evaluation in the emergency  department patient was found to be volume depleted with creatinine found to have increased to 1.44, up from 0.72.  Due to patient's volume depletion and acute kidney injury hospitalist group was then called to assess the patient for admission to the hospital.   Review of Systems: Review of Systems  Respiratory:  Positive for shortness of breath.   Neurological:  Positive for weakness.     Past Medical History:  Diagnosis Date   Bipolar 1 disorder, depressed ([place])    COPD (chronic obstructive pulmonary disease) ([place])    Depression with anxiety    Diabetes mellitus type II, non insulin dependent ([place]) 11/2020   A1c 7.3   Fall at home, initial encounter 05/28/2020   Last Assessment & Plan:  Formatting of this note might be different from the original. Acute onset, occurred few days ago.  Reports having a mechanical fall, fell forward injuring his left side including arm, knee.  Denies any vision changes, head trauma or loss of consciousness.  Patient is being referred to the emergency room for lower GI bleed, can also get evaluated for his injuries.   Gastric ulcer    when on NSAIDs   GERD (gastroesophageal reflux disease)    Helicobacter pylori antibody positive 07/04/2018   Formatting of this note might be different from the original. Started triple therapy 06/30/18.   Needs HP stool antigen in 8 weeks,  Off PPI for 10 days.   HTN (hypertension)    OSA (obstructive sleep apnea)    not currently on CPAP   Rheumatoid arthritis ([place])    Right kidney mass, 2.8 cm 11/2020   Tobacco abuse     Past Surgical History:  Procedure Laterality Date   left index finger surgery Left     Social History:  reports that he has been smoking cigarettes. He has been smoking an average of 1 pack per day. He has never used smokeless tobacco. He reports that he does not currently use alcohol. He reports that he does not use drugs.  No Known Allergies  Family History  Problem Relation Age of Onset    Schizophrenia Mother    Diabetes Mellitus II Father     Prior to Admission medications   Medication Sig Start Date End Date Taking? Authorizing Provider  albuterol (VENTOLIN HFA) 108 (90 Base) MCG/ACT inhaler Inhale 2 puffs into the lungs every 6  hours as needed for wheezing or shortness of breath. 01/20/21   [name], MD  atorvastatin (LIPITOR) 10 MG tablet Take 1 tablet (10 mg total) by mouth once daily. 06/24/21   [name], MD  Blood Glucose Monitoring Suppl (ACCU-CHEK GUIDE) w/Device KIT Use up to four times daily as directed. 07/26/21   Antonieta Pert, MD  budesonide-formoterol (SYMBICORT) 160-4.5 MCG/ACT inhaler Inhale 2 puffs into the lungs 2 (two) times daily. 03/24/21   Elodia Florence., MD  ciprofloxacin (CILOXAN) 0.3 % ophthalmic solution Place 1 drop into the right eye every 4 (four) hours while awake. 06/23/21   [name], MD  dapagliflozin propanediol (FARXIGA) 10 MG TABS tablet Take 1 tablet (10 mg total) by mouth daily. 07/09/21   [name], MD  DULoxetine (CYMBALTA) 60 MG capsule Take 1 capsule (60 mg total) by mouth daily. 06/24/21   [name], MD  famotidine (PEPCID) 20 MG tablet Take 1 tablet (20 mg total) by mouth 2 (two) times daily. 06/24/21   [name], MD  Fingerstix Lancets MISC Use as directed up to 4 times daily. 07/26/21   Antonieta Pert, MD  fluticasone (FLONASE) 50 MCG/ACT nasal spray Place 2 sprays into both nostrils daily. 01/13/21   [name], MD  furosemide (LASIX) 40 MG tablet Take 1 tablet (40 mg total) by mouth 2 (two) times daily. 07/26/21 08/25/21  Antonieta Pert, MD  gabapentin (NEURONTIN) 300 MG capsule Take 2 capsules in the morning AND 1 capsule midday AND 2 capsules every evening. 06/10/21   [name], MD  glucose blood test strip Use four times daily as directed. 07/26/21   Antonieta Pert, MD  hydrOXYzine (ATARAX) 25 MG tablet Take 1 tablet (25 mg total) by mouth 3 (three) times daily as needed. 06/24/21    [name], MD  insulin aspart (NOVOLOG) 100 UNIT/ML FlexPen Inject 12 Units into the skin 3 (three) times daily with meals. 07/26/21   Antonieta Pert, MD  insulin glargine (LANTUS) 100 UNIT/ML Solostar Pen Inject 35 Units into the skin daily. 07/26/21 10/20/21  Antonieta Pert, MD  Insulin Pen Needle 32G X 4 MM MISC Use with insulin pens. 07/26/21 08/25/21  Antonieta Pert, MD  linagliptin (TRADJENTA) 5 MG TABS tablet Take 1 tablet (5 mg total) by mouth daily. 07/27/21 08/26/21  Antonieta Pert, MD  metFORMIN (GLUCOPHAGE) 500 MG tablet Take 2 tablets (1,000 mg total) by mouth 2 (two) times daily with a meal. 06/10/21   [name], MD  Multiple Vitamins-Minerals (PRESERVISION AREDS 2) CAPS Take 1 capsule by mouth 2 (two) times daily with food. Take 1 in the morning and 1 in the evening. 04/29/21   [name], MD  olopatadine (PATANOL) 0.1 % ophthalmic solution Place 1 drop  into the right eye 2 (two) times daily. 03/25/21   [name], MD  risperidone (RISPERDAL) 4 MG tablet Take 1 tablet (4 mg total) by mouth once daily. 06/10/21   [name], MD  tamsulosin (FLOMAX) 0.4 MG CAPS capsule Take 1 capsule (0.4 mg total) by mouth daily. 06/10/21   [name], MD    Physical Exam:  Vitals:   07/27/21 0430 07/27/21 0500 07/27/21 0625 07/27/21 0630  BP: 111/70 118/76  127/85  Pulse: (!) 111 (!) 112  (!) 108  Resp: _0 Temp:   97.9 F (36.6 C)   TempSrc:   Oral   SpO2: 97% 94%  98%  Weight:      Height:        Constitutional: Awake alert and oriented x3, patient is exhibiting some respiratory distress Skin: no rashes, no lesions, good skin turgor noted. Eyes: Pupils are equally reactive to light.  No evidence of scleral icterus or conjunctival pallor.  ENMT: Dry mucous membranes noted.  Posterior pharynx clear of any exudate or lesions.   Neck: normal, supple, no masses, no thyromegaly.  No evidence of jugular venous distension.   Respiratory: Notable expiratory wheezing in all  fields with slightly prolonged expiratory phase.  Normal respiratory effort. No accessory muscle use.  Cardiovascular: Regular rate and rhythm, no murmurs / rubs / gallops. No extremity edema. 2+ pedal pulses. No carotid bruits.  Chest:   Nontender without crepitus or deformity.   Back:   Nontender without crepitus or deformity. Abdomen: Abdomen is protuberant but soft and nontender.  No evidence of intra-abdominal masses.  Positive bowel sounds noted in all quadrants.   Musculoskeletal: No joint deformity upper and lower extremities. Good ROM, no contractures. Normal muscle tone.  Neurologic: CN 2-12 grossly intact. Sensation intact.  Patient moving all 4 extremities spontaneously.  Patient is following all commands.  Patient is responsive to verbal stimuli.   Psychiatric: Markedly flat affect with depressed mood.  Patient seems to possess insight as to their current situation.    Data Reviewed:  I have personally reviewed and interpreted labs, imaging.  Significant findings are:  Chemistry revealing sodium 136, potassium 4.2, chloride 98, bicarbonate 27, BUN 19, creatinine 1.44.   CBC revealing white blood cell count of 15, hemoglobin of 15.2, hematocrit 50, platelet count of 209.   Urine drug screen found to be unremarkable.   Creatinine kinase found to be 27.   Troponin 9.   BNP 13.9. Chest x-ray personally reviewed revealing no evidence of acute cardiopulmonary disease.  EKG: Personally reviewed.  Rhythm is sinus tachycardia with heart rate of 115 bpm.  No dynamic ST segment changes appreciated.   Assessment and Plan: * AKI (acute kidney injury) ([place]) Patient exhibiting evidence of acute kidney injury secondary to volume depletion Creatinine is currently 1.44 an increase compared to baseline of 0.72 Hydrating patient with intravenous isotonic fluids. Strict input and output monitoring Monitoring renal function and electrolytes with serial chemistries Avoiding nephrotoxic agents  if at all possible   COPD with acute exacerbation [facility]) Patient presenting with shortness of breath with physical exam consistent with mild COPD exacerbation Treating patient with bronchodilator therapy and systemic steroids Supplemental oxygen as needed for bouts of hypoxia with target oxygen saturations of between 89 to 92%. Obtaining VBG   Chronic diastolic heart failure (HCC) No clinical evidence of cardiogenic volume overload Strict input and output monitoring Daily weights Low-sodium diet   Type 2 diabetes mellitus  with hyperglycemia, with long-term current use of insulin (HCC) Continue home regimen of insulin therapy Patient been placed on Accu-Cheks before every meal and nightly with sliding scale insulin Holding home regimen of oral hypoglycemics Hemoglobin A1C ordered Diabetic Diet   GERD (gastroesophageal reflux disease) Continue home regimen of famotidine  Schizo affective schizophrenia ([place]) Continue home regimen of psychotropic regimen  OSA/OHS Supplemental oxygen with sleep       Code Status:  Full code  code status decision has been confirmed with: patient Family Communication: deferred   Consults: None  Severity of Illness:  The appropriate patient status for this patient is OBSERVATION. Observation status is judged to be reasonable and necessary in order to provide the required intensity of service to ensure the patient's safety. The patient's presenting symptoms, physical exam findings, and initial radiographic and laboratory data in the context of their medical condition is felt to place them at decreased risk for further clinical deterioration. Furthermore, it is anticipated that the patient will be medically stable for discharge from the hospital within 2 midnights of admission.   Author:  Vernelle Emerald MD  07/27/2021 7:50 AM

## 2021-07-27 NOTE — Assessment & Plan Note (Addendum)
   Patient presenting with shortness of breath with physical exam consistent with mild COPD exacerbation  Treating patient with bronchodilator therapy and systemic steroids  Supplemental oxygen as needed for bouts of hypoxia with target oxygen saturations of between 89 to 92%.  Obtaining VBG

## 2021-07-27 NOTE — ED Notes (Signed)
EMS also reported they found bed bugs on patient. Charge RN made aware.

## 2021-07-28 ENCOUNTER — Other Ambulatory Visit (HOSPITAL_COMMUNITY): Payer: Self-pay

## 2021-07-28 DIAGNOSIS — N179 Acute kidney failure, unspecified: Secondary | ICD-10-CM | POA: Diagnosis not present

## 2021-07-28 LAB — BASIC METABOLIC PANEL
Anion gap: 6 (ref 5–15)
BUN: 10 mg/dL (ref 6–20)
CO2: 28 mmol/L (ref 22–32)
Calcium: 8.4 mg/dL — ABNORMAL LOW (ref 8.9–10.3)
Chloride: 105 mmol/L (ref 98–111)
Creatinine, Ser: 0.84 mg/dL (ref 0.61–1.24)
GFR, Estimated: >60 mL/min (ref >60)
Glucose, Bld: 231 mg/dL — ABNORMAL HIGH (ref 70–99)
Potassium: 3.9 mmol/L (ref 3.5–5.1)
Sodium: 139 mmol/L (ref 135–145)

## 2021-07-28 LAB — GLUCOSE, CAPILLARY
Glucose-Capillary: 189 mg/dL — ABNORMAL HIGH (ref 70–99)
Glucose-Capillary: 275 mg/dL — ABNORMAL HIGH (ref 70–99)
Glucose-Capillary: 269 mg/dL — ABNORMAL HIGH (ref 70–99)
Glucose-Capillary: 295 mg/dL — ABNORMAL HIGH (ref 70–99)

## 2021-07-28 LAB — MAGNESIUM: Magnesium: 2.2 mg/dL (ref 1.7–2.4)

## 2021-07-28 MED ORDER — FUROSEMIDE 40 MG PO TABS
40.0000 mg | ORAL_TABLET | Freq: Two times a day (BID) | ORAL | Status: DC
  Administered 2021-07-28 – 2021-07-30 (×5): 40 mg via ORAL
  Filled 2021-07-28 (×5): qty 1

## 2021-07-28 NOTE — Evaluation (Signed)
Occupational Therapy Evaluation Patient Details Name: Raymond Hood MRN: [id number] DOB: [birth year] Today's Date: 07/28/2021   History of Present Illness Pt is a 51 y/o male admitted secondary to SOB and dizzines on 7/17. Pt recently discharged from hospital on 7/17 secondary to PNA. PMH includes COPD, schizoaffective disorder, DM, dCHF, and nicotine dependence.   Clinical Impression   Pt reports struggling with LB bathing and dressing and pericare prior to admission. He received a sock aid and reacher last admission, may benefit from long handled bath sponge and toilet tongs as well. Pt transferred this visit with min guard assist x 2, declined ambulation and wanted to go back to bed to sleep rather than remain up in chair. Will follow acutely.      Recommendations for follow up therapy are one component of a multi-disciplinary discharge planning process, led by the attending physician.  Recommendations may be updated based on patient status, additional functional criteria and insurance authorization.   Follow Up Recommendations  No OT follow up    Assistance Recommended at Discharge Intermittent Supervision/Assistance  Patient can return home with the following A lot of help with walking and/or transfers;A lot of help with bathing/dressing/bathroom;Assistance with cooking/housework;Assist for transportation;Help with stairs or ramp for entrance    Functional Status Assessment  Patient has had a recent decline in their functional status and demonstrates the ability to make significant improvements in function in a reasonable and predictable amount of time.  Equipment Recommendations  None recommended by OT    Recommendations for Other Services       Precautions / Restrictions Precautions Precautions: Fall      Mobility Bed Mobility Overal bed mobility: Needs Assistance Bed Mobility: Supine to Sit, Sit to Supine     Supine to sit: Min assist Sit to supine: Min assist    General bed mobility comments: assist to raise trunk and for LEs back into bed    Transfers Overall transfer level: Needs assistance Equipment used: None Transfers: Sit to/from Stand Sit to Stand: Min guard Stand pivot transfers: Min guard         General transfer comment: pt declined remaining up in chair, wanting to sleep      Balance Overall balance assessment: Mild deficits observed, not formally tested   Sitting balance-Leahy Scale: Good       Standing balance-Leahy Scale: Fair                             ADL either performed or assessed with clinical judgement   ADL Overall ADL's : Needs assistance/impaired Eating/Feeding: Independent   Grooming: Wash/dry hands;Wash/dry face;Sitting;Set up   Upper Body Bathing: Set up;Sitting   Lower Body Bathing: Sit to/from stand;Moderate assistance Lower Body Bathing Details (indicate cue type and reason): could benefit from long handled bath sponge Upper Body Dressing : Set up;Sitting   Lower Body Dressing: Moderate assistance;Sit to/from stand   Toilet Transfer: Minimal Cabin crew Details (indicate cue type and reason): simulated to and from chair Toileting- Clothing Manipulation and Hygiene: Moderate assistance;Sitting/lateral lean;Sit to/from stand Toileting - Clothing Manipulation Details (indicate cue type and reason): reports difficulty with posterior pericare             Vision Patient Visual Report: No change from baseline       Perception     Praxis      Pertinent Vitals/Pain Pain Assessment Pain Assessment: Faces Pain Location: low back Pain Descriptors /  Indicators: Discomfort     Hand Dominance Right   Extremity/Trunk Assessment Upper Extremity Assessment Upper Extremity Assessment: Generalized weakness (full ROM)   Lower Extremity Assessment Lower Extremity Assessment: Defer to PT evaluation   Cervical / Trunk Assessment Cervical / Trunk  Assessment: Other exceptions Cervical / Trunk Exceptions: increased body habitus, back pain   Communication Communication Communication: No difficulties   Cognition Arousal/Alertness: Awake/alert Behavior During Therapy: Flat affect Overall Cognitive Status: No family/caregiver present to determine baseline cognitive functioning                                 General Comments: hx of schizoaffective/bipolar disorder. flat affect but follows directions consistently, endorses memory deficits     General Comments       Exercises     Shoulder Instructions      Home Living Family/patient expects to be discharged to:: Shelter/Homeless                                 Additional Comments: Currently at [facility] homeless shelter      Prior Functioning/Environment Prior Level of Function : Independent/Modified Independent             Mobility Comments: Uses cane for mobility for short distances. ADLs Comments: Pt received reacher and sock aid last admission and there is a seat he can use at the shelter to shower, he has not had an opportunity to use any of them.        OT Problem List: Impaired balance (sitting and/or standing);Decreased activity tolerance;Decreased strength;Decreased cognition;Obesity      OT Treatment/Interventions: Self-care/ADL training;Therapeutic exercise;DME and/or AE instruction;Therapeutic activities;Patient/family education    OT Goals(Current goals can be found in the care plan section) Acute Rehab OT Goals OT Goal Formulation: With patient Time For Goal Achievement: 08/11/21 Potential to Achieve Goals: Good ADL Goals Pt Will Perform Grooming: with modified independence;standing Pt Will Perform Lower Body Bathing: with modified independence;with adaptive equipment;sit to/from stand Pt Will Perform Lower Body Dressing: with modified independence;with adaptive equipment;sit to/from stand Pt Will Transfer to  Toilet: with modified independence;ambulating;regular height toilet Pt Will Perform Toileting - Clothing Manipulation and hygiene: with modified independence;with adaptive equipment;sit to/from stand Pt/caregiver will Perform Home Exercise Program: Increased strength;Both right and left upper extremity;With theraband;Independently;With written HEP provided  OT Frequency: Min 2X/week    Co-evaluation              AM-PAC OT "6 Clicks" Daily Activity     Outcome Measure Help from another person eating meals?: None Help from another person taking care of personal grooming?: A Little Help from another person toileting, which includes using toliet, bedpan, or urinal?: A Little Help from another person bathing (including washing, rinsing, drying)?: A Lot Help from another person to put on and taking off regular upper body clothing?: A Little   6 Click Score: 15   End of Session Equipment Utilized During Treatment: Gait belt  Activity Tolerance: Patient limited by fatigue Patient left: in bed;with call bell/phone within reach;with bed alarm set  OT Visit Diagnosis: Unsteadiness on feet (R26.81);Other abnormalities of gait and mobility (R26.89);Pain;Muscle weakness (generalized) (M62.81);Other symptoms and signs involving cognitive function                Time: 1050-1110 OT Time Calculation (min): 20 min Charges:  OT General Charges $OT  Visit: 1 Visit OT Evaluation $OT Eval Moderate Complexity: 1 Mod  Berna Spare, OTR/L Acute Rehabilitation Services Office: [phone]   Evern Bio 07/28/2021, 11:16 AM

## 2021-07-28 NOTE — Consult Note (Addendum)
Cardiology Consultation:   Patient ID: Raymond Hood MRN: [id number]; DOB: [birth year]  Admit date: 07/26/2021 Date of Consult: 07/28/2021  PCP:  [name], MD   [place] Providers Cardiologist:  New to Dr Margaretann Loveless   Patient Profile:   Raymond Hood is a 51 y.o. male with a hx of COPD, OSA, HTN, type 2 DM,  schizoaffective disorder, tobacco abuse, diastolic heart failure, who is being seen 07/28/2021 for the evaluation of CHF at the request of Dr Reesa Chew.  History of Present Illness:   Mr. Lagace with above past medical history presented to the ER 07/27/2021 for shortness of breath.  He is homeless and lives in a shelter.   He was hospitalized here 07/14/2021 to 07/26/2021 for acute hypoxic respiratory failure due to pneumonia, COPD exacerbation, and decompensated diastolic heart failure.  He was managed by internal medicine service, treated with oxygen therapy, bronchodilator, steroids, antibiotic, and diuretic.  He was diuresed with IV Lasix and had Net -24,210ml on 07/26/21 per discharge summary.  He was maintained Farxiga 10 mg daily for diabetes historically. Echo from 07/15/2021 with LVEF 55 to 60%, no regional motion abnormality, grade 1 DD, normal RV, no significant valvular disease.  He does not follow-up with cardiology regularly.  He has no prior history of diastolic heart failure documented before hospitalization mentioned above.  He follows Dr. Gladys Damme for COPD, reportedly baseline pulse ox 92 to 93% on room air with ambulation, uses BiPAP at night.  He was given Lasix 40 mg twice daily by PCP team for swelling.   Patient states he is unaware of the diagnosis of CHF.  He reports chronic shortness of breath, activity intolerance, leg swelling from time to time over the past several years.  He states his doctor gave him Lasix to get rid of extra water weight.  He states his average weight is around 390 pounds currently.  He reports intermittent chest tightness over the past  several years, sometimes triggered by exertional activity such as walking.  He denied history of MI, reports his dad had MI at the age of 57s.  He continues smokes cigarettes daily, difficult to quit.  He is unaware of what medication he takes at home, states the nurse at homeless shelter administers meds.  He denies any alcohol use.  Diagnostic work-up here revealed compensated VBG.  Initial BMP with elevated creatinine 1.44, initial CBC with leukocytosis 15,000.  UDS negative.  UA positive glucose.  TSH WNL, HsTroponin negative x2.  BNP 13.9.  Repeat labs revealed resolved AKI today.  He has been persistently hyperglycemic >200 with glucose.     Past Medical History:  Diagnosis Date   Bipolar 1 disorder, depressed ([place])    COPD (chronic obstructive pulmonary disease) ([place])    Depression with anxiety    Diabetes mellitus type II, non insulin dependent ([place]) 11/2020   A1c 7.3   Fall at home, initial encounter 05/28/2020   Last Assessment & Plan:  Formatting of this note might be different from the original. Acute onset, occurred few days ago.  Reports having a mechanical fall, fell forward injuring his left side including arm, knee.  Denies any vision changes, head trauma or loss of consciousness.  Patient is being referred to the emergency room for lower GI bleed, can also get evaluated for his injuries.   Gastric ulcer    when on NSAIDs   GERD (gastroesophageal reflux disease)    Helicobacter pylori antibody positive 07/04/2018   Formatting of this note  might be different from the original. Started triple therapy 06/30/18.   Needs HP stool antigen in 8 weeks,  Off PPI for 10 days.   HTN (hypertension)    OSA (obstructive sleep apnea)    not currently on CPAP   Rheumatoid arthritis ([place])    Right kidney mass, 2.8 cm 11/2020   Tobacco abuse     Past Surgical History:  Procedure Laterality Date   left index finger surgery Left      Home Medications:  Prior to Admission medications    Medication Sig Start Date End Date Taking? Authorizing Provider  albuterol (VENTOLIN HFA) 108 (90 Base) MCG/ACT inhaler Inhale 2 puffs into the lungs every 6  hours as needed for wheezing or shortness of breath. 01/20/21  Yes [name], MD  atorvastatin (LIPITOR) 10 MG tablet Take 1 tablet (10 mg total) by mouth once daily. 06/24/21  Yes [name], MD  budesonide-formoterol [facility]) 160-4.5 MCG/ACT inhaler Inhale 2 puffs into the lungs 2 (two) times daily. 03/24/21  Yes Elodia Florence., MD  ciprofloxacin (CILOXAN) 0.3 % ophthalmic solution Place 1 drop into the right eye every 4 (four) hours while awake. 06/23/21  Yes [name], MD  dapagliflozin propanediol (FARXIGA) 10 MG TABS tablet Take 1 tablet (10 mg total) by mouth daily. 07/09/21  Yes [name], MD  DULoxetine (CYMBALTA) 60 MG capsule Take 1 capsule (60 mg total) by mouth daily. 06/24/21  Yes [name], MD  famotidine (PEPCID) 20 MG tablet Take 1 tablet (20 mg total) by mouth 2 (two) times daily. 06/24/21  Yes [name], MD  fluticasone (FLONASE) 50 MCG/ACT nasal spray Place 2 sprays into both nostrils daily. 01/13/21  Yes [name], MD  furosemide (LASIX) 40 MG tablet Take 1 tablet (40 mg total) by mouth 2 (two) times daily. 07/26/21 08/25/21 Yes Antonieta Pert, MD  gabapentin (NEURONTIN) 300 MG capsule Take 2 capsules in the morning AND 1 capsule midday AND 2 capsules every evening. 06/10/21  Yes [name], MD  hydrOXYzine (ATARAX) 25 MG tablet Take 1 tablet (25 mg total) by mouth 3 (three) times daily as needed. 06/24/21  Yes [name], MD  insulin aspart (NOVOLOG) 100 UNIT/ML FlexPen Inject 12 Units into the skin 3 (three) times daily with meals. 07/26/21  Yes Kc, Maren Beach, MD  insulin glargine (LANTUS) 100 UNIT/ML Solostar Pen Inject 35 Units into the skin daily. 07/26/21 10/20/21 Yes Antonieta Pert, MD  linagliptin (TRADJENTA) 5 MG TABS tablet Take 1 tablet (5 mg total) by mouth  daily. 07/27/21 08/26/21 Yes Antonieta Pert, MD  metFORMIN (GLUCOPHAGE) 500 MG tablet Take 2 tablets (1,000 mg total) by mouth 2 (two) times daily with a meal. 06/10/21  Yes [name], MD  Multiple Vitamins-Minerals (PRESERVISION AREDS 2) CAPS Take 1 capsule by mouth 2 (two) times daily with food. Take 1 in the morning and 1 in the evening. 04/29/21  Yes [name], MD  olopatadine (PATANOL) 0.1 % ophthalmic solution Place 1 drop into the right eye 2 (two) times daily. 03/25/21  Yes [name], MD  risperidone (RISPERDAL) 4 MG tablet Take 1 tablet (4 mg total) by mouth once daily. 06/10/21  Yes [name], MD  tamsulosin (FLOMAX) 0.4 MG CAPS capsule Take 1 capsule (0.4 mg total) by mouth daily. 06/10/21  Yes [name], MD  Blood Glucose Monitoring Suppl (ACCU-CHEK GUIDE) w/Device KIT Use up to four times daily  as directed. 07/26/21   Antonieta Pert, MD  Fingerstix Lancets MISC Use as directed up to 4 times daily. 07/26/21   Antonieta Pert, MD  glucose blood test strip Use four times daily as directed. 07/26/21   Antonieta Pert, MD  Insulin Pen Needle 32G X 4 MM MISC Use with insulin pens. 07/26/21 08/25/21  Antonieta Pert, MD    Inpatient Medications: Scheduled Meds:  atorvastatin  10 mg Oral Daily   ciprofloxacin  1 drop Right Eye Q4H while awake   DULoxetine  60 mg Oral Daily   enoxaparin (LOVENOX) injection  80 mg Subcutaneous Q24H   famotidine  20 mg Oral BID   fluticasone  2 spray Each Nare Daily   fluticasone furoate-vilanterol  1 puff Inhalation Daily   gabapentin  300 mg Oral Q lunch   gabapentin  600 mg Oral q AM   gabapentin  600 mg Oral QHS   insulin aspart  0-15 Units Subcutaneous TID AC & HS   insulin aspart  12 Units Subcutaneous TID WC   insulin glargine-yfgn  35 Units Subcutaneous Daily   ipratropium-albuterol  3 mL Nebulization Q6H   olopatadine  1 drop Right Eye BID   predniSONE  50 mg Oral Q breakfast   risperidone  4 mg Oral Daily   tamsulosin  0.4 mg Oral Daily    Continuous Infusions:  PRN Meds: acetaminophen **OR** acetaminophen, guaiFENesin, hydrALAZINE, hydrOXYzine, ipratropium-albuterol, metoprolol tartrate, ondansetron **OR** ondansetron (ZOFRAN) IV, polyethylene glycol, senna-docusate, traZODone  Allergies:   No Known Allergies  Social History:   Social History   Socioeconomic History   Marital status: Single    Spouse name: Not on file   Number of children: Not on file   Years of education: Not on file   Highest education level: Not on file  Occupational History   Not on file  Tobacco Use   Smoking status: Every Day    Packs/day: 1.00    Types: Cigarettes   Smokeless tobacco: Never  Vaping Use   Vaping Use: Never used  Substance and Sexual Activity   Alcohol use: Not Currently   Drug use: Never   Sexual activity: Not on file  Other Topics Concern   Not on file  Social History Narrative   Not on file   Social Determinants of Health   Financial Resource Strain: Not on file  Food Insecurity: Not on file  Transportation Needs: Not on file  Physical Activity: Not on file  Stress: Not on file  Social Connections: Not on file  Intimate Partner Violence: Not on file    Family History:    Family History  Problem Relation Age of Onset   Schizophrenia Mother    Diabetes Mellitus II Father      ROS:  Constitutional: Denied fever, chills, malaise, night sweats Eyes: Denied vision change or loss Ears/Nose/Mouth/Throat: Denied ear ache, sore throat, coughing, sinus pain Cardiovascular: See HPI Respiratory: See HPI  gastrointestinal: Denied nausea, vomiting, abdominal pain, diarrhea Genital/Urinary: Denied dysuria, hematuria, urinary frequency/urgency Musculoskeletal: Denied muscle ache, joint pain, weakness Skin: Denied rash, wound Neuro: Denied headache, dizziness, syncope Psych: history of depression/anxiety  Endocrine: history of diabetes    Physical Exam/Data:   Vitals:   07/28/21 0235 07/28/21 0450  07/28/21 0826 07/28/21 0919  BP:  111/81  139/78  Pulse:  (!) 104  (!) 103  Resp:  20  18  Temp:  98 F (36.7 C)  98.3 F (36.8 C)  TempSrc:  SpO2: 95% 97% 99% 95%  Weight:      Height:        Intake/Output Summary (Last 24 hours) at 07/28/2021 1204 Last data filed at 07/28/2021 1018 Gross per 24 hour  Intake 1135 ml  Output 900 ml  Net 235 ml      07/26/2021    8:50 PM 07/26/2021    2:07 AM 07/24/2021    4:25 AM  Last 3 Weights  Weight (lbs) 385 lb 5.8 oz 358 lb 11 oz 353 lb 2.8 oz  Weight (kg) 174.8 kg 162.7 kg 160.2 kg     Body mass index is 58.59 kg/m.   Vitals:  Vitals:   07/28/21 0826 07/28/21 0919  BP:  139/78  Pulse:  (!) 103  Resp:  18  Temp:  98.3 F (36.8 C)  SpO2: 99% 95%   General Appearance: In no apparent distress, laying in bed, poor hygiene, morbidly obese HEENT: Normocephalic, atraumatic.  Neck: Short and thick neck, difficult to assess JVD Cardiovascular: Regular rate and rhythm, normal S1-S2,  no murmur Respiratory: Resting breathing unlabored, lungs sounds with scattered rales to auscultation bilaterally, no use of accessory muscles. On room air.   Gastrointestinal: Bowel sounds positive, abdomen soft Extremities: Able to move all extremities in bed without difficulty, 1 - edema bilateral lower extremity  genitourinary: Foley catheter with amber-colored urine Musculoskeletal: Normal muscle bulk and tone Skin: Intact, warm, dry. No rashes or petechiae noted in exposed areas.  Neurologic: Alert, oriented to person, place and time.no cognitive deficit, limited insight Psychiatric: Normal affect. Mood is appropriate.      EKG:  The EKG was personally reviewed and demonstrates:    EKG from 07/26/2021 and 07/27/2021 revealed sinus tachycardia 110s    Relevant CV Studies:   Echocardiogram from 07/15/2021 revealed:   1. Left ventricular ejection fraction, by estimation, is 55 to 60%. The  left ventricle has normal function. The left  ventricle has no regional  wall motion abnormalities. Left ventricular diastolic parameters are  consistent with Grade I diastolic  dysfunction (impaired relaxation).   2. Right ventricular systolic function is normal. The right ventricular  size is normal.   3. The mitral valve is normal in structure. No evidence of mitral valve  regurgitation. No evidence of mitral stenosis.   4. The aortic valve is tricuspid. Aortic valve regurgitation is not  visualized. No aortic stenosis is present.   5. The inferior vena cava is normal in size with greater than 50%  respiratory variability, suggesting right atrial pressure of 3 mmHg.   Comparison(s): No significant change from prior study. Prior images  reviewed side by side.   Laboratory Data:  High Sensitivity Troponin:   Recent Labs  Lab 07/14/21 1550 07/14/21 1724 07/26/21 2237 07/27/21 0825  TROPONINIHS 24* 22* 9 9     Chemistry Recent Labs  Lab 07/26/21 2237 07/27/21 1033 07/28/21 0517  NA 136 140 139  K 4.2 4.2 3.9  CL 98 103 105  CO2 _0 GLUCOSE 227* 202* 231*  BUN _1 CREATININE 1.44* 0.95 0.84  CALCIUM 8.5* 8.2* 8.4*  MG  --   --  2.2  GFRNONAA 60* >60 >60  ANIONGAP _2 Recent Labs  Lab 07/27/21 1033  PROT 5.7*  ALBUMIN 2.7*  AST 16  ALT 22  ALKPHOS 60  BILITOT 0.6   Lipids No results for input(s): "CHOL", "TRIG", "HDL", "LABVLDL", "LDLCALC", "CHOLHDL" in the last  168 hours.  Hematology Recent Labs  Lab 07/26/21 2237 07/27/21 0829  WBC 15.0* 12.4*  RBC 5.81 5.98*  HGB 15.2 16.1  HCT 50.0 50.8  MCV 86.1 84.9  MCH 26.2 26.9  MCHC 30.4 31.7  RDW 15.8* 16.2*  PLT 209 202   Thyroid  Recent Labs  Lab 07/27/21 0829  TSH 1.888    BNP Recent Labs  Lab 07/26/21 2237  BNP 13.9    DDimer No results for input(s): "DDIMER" in the last 168 hours.   Radiology/Studies:  DG Chest Portable 1 View  Result Date: 07/26/2021 CLINICAL DATA:  Dyspnea EXAM: PORTABLE CHEST 1 VIEW  COMPARISON:  07/15/2021 FINDINGS: Lungs are clear. No pneumothorax or pleural effusion. Cardiac size is mildly enlarged, stable since prior examination. Widening of the right paratracheal soft tissues relates to vascular shadow and prominent mediastinal fat better seen on CT examination of 10/20/2020. Pulmonary vascularity is normal. No acute bone abnormality. IMPRESSION: No active disease. Electronically Signed   By: Fidela Salisbury M.D.   On: 07/26/2021 22:18     Assessment and Plan:    Chronic diastolic heart failure -Reports years of intermittent shortness of breath, activity intolerance, leg edema -Echo from 07/15/2021 with LVEF 55 to 60%, grade 1 DD, normal RV, no significant valvular disease -On Lasix 40 mg twice daily historically for edema -Clinically mildly hypervolemic today AKI has resolved, no evidence of rhabdo, will restart p.o. Lasix 3m BID -GDMT: Continue Farxiga -Discussed weight loss, sodium limitation -Suspect intermittent chest discomfort is due to volume overload, potential for ischemic evaluation if chest pain becomes persistent, can be done outpatient   Hypertension - BP is controlled, on Lasix only historically  Sinus tachycardia -Likely mediated by underlying lung disease + bronchodilator use  Resolved AKI COPD with exacerbation Schizoaffective disorder Type 2 diabetes Morbid obesity - per primary team    Risk Assessment/Risk Scores:  {  New York Heart Association (NYHA) Functional Class NYHA Class III    For questions or updates, please contact [facility] Please consult [email or web address] for contact info under    Signed, XMargie Billet NP  07/28/2021 12:04 PM   Patient seen and examined with XMargie BilletNP.  Agree as above, with the following exceptions and changes as noted below.  Patient with a long recent hospitalization with nearly 25 L of diuresis with prompt bounce back for shortness of breath.  Initially noted to have mild renal insufficiency  responsive to volume resuscitation.  Denies dietary indiscretion upon leaving the hospital.  [facility] and does not find them to be excessively salty.  Patient is obese making volume assessment very challenging, though appears euvolemic by physical exam at this time.  Gen: NAD, CV: RRR, no murmurs, Lungs: clear, Abd: soft, Extrem: Warm, well perfused, trace edema, Neuro/Psych: alert and oriented x 3, normal mood and affect. All available labs, radiology testing, previous records reviewed.  Needs close primary care follow-up to avoid repeat admission.  I would be happy to see the patient in follow-up in cardiology if that is feasible for the patient otherwise diuretics can be managed by primary care.  Minimal troponin elevation at prior hospital stay with normal troponin today.  He does have possible cardiac chest discomfort though this is most likely related to volume overload.  May be reasonable to consider an outpatient nuclear stress test [place] the [place] or  [place].  If he has recurrent heart failure, would consider right heart catheterization to better understand the nature of his heart failure and goals for diuresis.  Okay to resume home oral diuretic, primary service plans to dismiss today if no other active issues.  Elouise Munroe, MD 07/28/21 2:47 PM

## 2021-07-28 NOTE — Progress Notes (Signed)
Pt placed on cpap and is tolerating it well at this time. Rt will continue to monitor.  

## 2021-07-28 NOTE — TOC Initial Note (Signed)
Transition of Care [facility]) - Initial/Assessment Note    Patient Details  Name: Raymond Hood MRN: [id number] Date of Birth: 07-23-1970  Transition of Care [facility]) CM/SW Contact:    Milinda Antis, [place] Phone Number: 07/28/2021, 3:58 PM  Clinical Narrative:                 CSW met with the patient at bedside.  CSW explained to the patient that per Dr. Reesa Chew, [facility] is under renovations until Friday.  The patient is not familiar with the [facility] and is a frequent readmit.  The patient will not have a safe discharge until he can return to [facility] and stay in the shelter during the day.  The attending spoke with [name], who sees the patient at the shelter, and was informed that the patient may be able to stay in the shelter during the day with approval by [name].  D/C pending [facility] renovations that should be completed this Friday.    Expected Discharge Plan: Homeless Shelter Barriers to Discharge: Homeless with medical needs   Patient Goals and CMS Choice Patient states their goals for this hospitalization and ongoing recovery are:: Discharge back to [facility]      Expected Discharge Plan and Services Expected Discharge Plan: Homeless Shelter       Living arrangements for the past 2 months: Homeless Shelter                                      Prior Living Arrangements/Services Living arrangements for the past 2 months: Homeless Shelter Lives with:: Other (Comment) (shelter residents) Patient language and need for interpreter reviewed:: Yes        Need for Family Participation in Patient Care: Yes (Comment) Care giver support system in place?: No (comment)   Criminal Activity/Legal Involvement Pertinent to Current Situation/Hospitalization: No - Comment as needed  Activities of Daily Living Home Assistive Devices/Equipment: Cane (specify quad or straight) ADL Screening (condition at time of admission) Patient's cognitive  ability adequate to safely complete daily activities?: Yes Is the patient deaf or have difficulty hearing?: No Does the patient have difficulty seeing, even when wearing glasses/contacts?: No Does the patient have difficulty concentrating, remembering, or making decisions?: No Patient able to express need for assistance with ADLs?: Yes Does the patient have difficulty dressing or bathing?: Yes Independently performs ADLs?: No Does the patient have difficulty walking or climbing stairs?: Yes Weakness of Legs: Both Weakness of Arms/Hands: None  Permission Sought/Granted                  Emotional Assessment Appearance:: Appears stated age Attitude/Demeanor/Rapport: Apprehensive Affect (typically observed): Stoic Orientation: : Oriented to Self, Oriented to Place, Oriented to Situation Alcohol / Substance Use: Tobacco Use Psych Involvement: No (comment)  Admission diagnosis:  Shortness of breath [R06.02] AKI (acute kidney injury) ([place]) [N17.9] Patient Active Problem List   Diagnosis Date Noted   AKI (acute kidney injury) ([place]) 07/27/2021   Anxiety and depression 07/14/2021   Essential hypertension 07/14/2021   CAP (community acquired pneumonia) 07/14/2021   Need for hepatitis C screening test 01/25/2021   Chronic diastolic heart failure ([place]) 01/14/2021   Type 2 diabetes mellitus with hyperglycemia, with long-term current use of insulin ([place]) 12/14/2020   GERD (gastroesophageal reflux disease) 10/15/2020   Tobacco abuse 10/15/2020   Hypertension associated with diabetes ([place]) 10/15/2020   Renal  mass, right 10/15/2020   COPD with acute exacerbation ([place]) 10/15/2020   Morbid obesity with BMI of 50.0-59.9, adult ([place]) 10/15/2020   Homeless 10/15/2020   MDD (major depressive disorder), recurrent episode ([place]) 09/28/2020   Anxiety 09/28/2020   Schizo affective schizophrenia ([place]) 09/28/2020   Numbness and tingling in both hands 05/28/2020   Adrenal hyperplasia ([place])  06/05/2019   Low back pain 08/29/2018   Multiple joint pain 08/29/2018   OSA/OHS 08/17/2018   Exocrine pancreatic insufficiency 07/02/2018   Asthma without status asthmaticus 03/27/2018   Epidermal inclusion cyst 07/20/2012   Psoriasis, unspecified 06/28/2012   Erectile dysfunction 06/28/2012   ADHD 11/17/1981   PCP:  [name], MD Pharmacy:   [facility] [address] [place] [address] Phone: [phone] Fax: [facility] [address] [place] [address] Phone: [phone] Fax: [facility] [address] [place] [address] Phone: [phone] Fax: [phone]     Social Determinants of Health (SDOH) Interventions    Readmission Risk Interventions     No data to display

## 2021-07-28 NOTE — Consult Note (Signed)
NAME:  Raymond Hood, MRN:  [id number], DOB:  09/12/1970, LOS: 0 ADMISSION DATE:  07/26/2021, CONSULTATION DATE: 07/28/2021 REFERRING MD: [facility], CHIEF COMPLAINT: Rhabdomyolysis  History of Present Illness:  51 year old male with a stents of past medical history that includes but not limited to bipolar disorder this rendered him unemployable and homeless.  COPD with continued tobacco abuse.  Diabetes type 2.  Obstructive sleep sleep apnea psoriasis right kidney mass of 2.8 cm.  He is recently discharged from [facility] likely treated for acute exacerbation of COPD and was scheduled to be followed up over [facility] state outside for prolonged.  Time readmitted to [facility] with mild rhabdomyolysis CK20 7 and mild creatinine elevation 1.42.  Pulmonary critical care was asked to pay a courtesy call on 07/28/2021.  Pertinent  Medical History   Past Medical History:  Diagnosis Date   Bipolar 1 disorder, depressed ([place])    COPD (chronic obstructive pulmonary disease) ([place])    Depression with anxiety    Diabetes mellitus type II, non insulin dependent ([place]) 11/2020   A1c 7.3   Fall at home, initial encounter 05/28/2020   Last Assessment & Plan:  Formatting of this note might be different from the original. Acute onset, occurred few days ago.  Reports having a mechanical fall, fell forward injuring his left side including arm, knee.  Denies any vision changes, head trauma or loss of consciousness.  Patient is being referred to the emergency room for lower GI bleed, can also get evaluated for his injuries.   Gastric ulcer    when on NSAIDs   GERD (gastroesophageal reflux disease)    Helicobacter pylori antibody positive 07/04/2018   Formatting of this note might be different from the original. Started triple therapy 06/30/18.   Needs HP stool antigen in 8 weeks,  Off PPI for 10 days.   HTN (hypertension)    OSA (obstructive sleep apnea)    not currently on CPAP   Rheumatoid arthritis ([place])     Right kidney mass, 2.8 cm 11/2020   Tobacco abuse      Significant Hospital Events: Including procedures, antibiotic start and stop dates in addition to other pertinent events   07/28/2021 pulmonary consult  Interim History / Subjective:  Admitted with mild elevated creatinine  Objective   Blood pressure 111/81, pulse (!) 104, temperature 98 F (36.7 C), resp. rate 20, height _0  (1.727 m), weight (!) 174.8 kg, SpO2 99 %.        Intake/Output Summary (Last 24 hours) at 07/28/2021 0855 Last data filed at 07/28/2021 4665 Gross per 24 hour  Intake 480 ml  Output 400 ml  Net 80 ml   Filed Weights   07/26/21 2050  Weight: (!) 174.8 kg    Examination: General: Morbidly obese 385 pound male who is extremely dull affect HENT: Heavy beard dry oral mucosa Lungs: Diminished throughout without wheezes Cardiovascular: Heart sounds are regular Abdomen: Obese soft nontender Extremities: Changes of psoriasis Neuro: Extremely dull a fact he is on multiple mood altering medications GU: [employer] Hospital Problem list     Assessment & Plan:  Mild rhabdomyolysis in the setting of acute exposure hypokalemia Acute kidney injury Lab Results  Component Value Date   CREATININE 0.84 07/28/2021   CREATININE 0.95 07/27/2021   CREATININE 1.44 (H) 07/26/2021   Hydration Avoid nephrotoxins He had been aggressively diuresed in the past admission therefore hold diuresis at this time Monitor creatinine CK was mildly elevated  COPD,  ongoing tobacco abuse.  OSA Bronchodilators Consider discontinuing steroids at this time Nocturnal as needed CPAP order placed  Severe schizoaffective with depression that is now made him unemployable and homeless. Continue current medications Continue to utilize off campus resources  Type 2 diabetes CBG (last 3)  Recent Labs    07/27/21 1636 07/27/21 2339 07/28/21 0742  GLUCAP 239* 188* 189*   Per primary Steroid exacerbation    Best  Practice (right click and "Reselect all SmartList Selections" daily)   Diet/type: Regular consistency (see orders) DVT prophylaxis: LMWH GI prophylaxis: PPI Lines: N/A Foley:  N/A Code Status:  full code Last date of multidisciplinary goals of care discussion [tbd]  Labs   CBC: Recent Labs  Lab 07/26/21 2237 07/27/21 0829  WBC 15.0* 12.4*  NEUTROABS 11.9* 9.4*  HGB 15.2 16.1  HCT 50.0 50.8  MCV 86.1 84.9  PLT 209 409    Basic Metabolic Panel: Recent Labs  Lab 07/23/21 0234 07/24/21 0201 07/26/21 2237 07/27/21 1033 07/28/21 0517  NA 137 138 136 140 139  K 3.5 3.8 4.2 4.2 3.9  CL 96* 97* 98 103 105  CO2 _0 GLUCOSE 158* 142* 227* 202* 231*  BUN 28* 23* _1 CREATININE 0.92 0.72 1.44* 0.95 0.84  CALCIUM 8.4* 8.6* 8.5* 8.2* 8.4*  MG  --   --   --   --  2.2   GFR: Estimated Creatinine Clearance: 168.8 mL/min (by C-G formula based on SCr of 0.84 mg/dL). Recent Labs  Lab 07/26/21 2237 07/27/21 0829  WBC 15.0* 12.4*    Liver Function Tests: Recent Labs  Lab 07/27/21 1033  AST 16  ALT 22  ALKPHOS 60  BILITOT 0.6  PROT 5.7*  ALBUMIN 2.7*   No results for input(s): "LIPASE", "AMYLASE" in the last 168 hours. No results for input(s): "AMMONIA" in the last 168 hours.  ABG    Component Value Date/Time   PHART 7.284 (L) 07/14/2021 1557   PCO2ART 80.5 (HH) 07/14/2021 1557   PO2ART 65 (L) 07/14/2021 1557   HCO3 33.0 (H) 07/27/2021 0503   TCO2 45 (H) 07/15/2021 0147   O2SAT 78.5 07/27/2021 0503     Coagulation Profile: No results for input(s): "INR", "PROTIME" in the last 168 hours.  Cardiac Enzymes: Recent Labs  Lab 07/26/21 2237  CKTOTAL 27*    HbA1C: HbA1c, POC (controlled diabetic range)  Date/Time Value Ref Range Status  06/01/2021 10:27 AM 8.3 (A) 0.0 - 7.0 % Final  01/25/2021 10:34 AM 7.7 (A) 0.0 - 7.0 % Final   Hgb A1c MFr Bld  Date/Time Value Ref Range Status  07/27/2021 06:25 AM 8.9 (H) 4.8 - 5.6 % Final     Comment:    (NOTE) Pre diabetes:          5.7%-6.4%  Diabetes:              >6.4%  Glycemic control for   <7.0% adults with diabetes     CBG: Recent Labs  Lab 07/27/21 0920 07/27/21 1129 07/27/21 1636 07/27/21 2339 07/28/21 0742  GLUCAP 198* 195* 239* 188* 189*    Review of Systems:   10 point review of system taken, please see HPI for positives and negatives. Limited due to patient's mental status.  Past Medical History:  He,  has a past medical history of Bipolar 1 disorder, depressed ([place]), COPD (chronic obstructive pulmonary disease) ([place]), Depression with anxiety, Diabetes mellitus type II, non insulin dependent ([place]) (11/2020),  Fall at home, initial encounter (05/28/2020), Gastric ulcer, GERD (gastroesophageal reflux disease), Helicobacter pylori antibody positive (07/04/2018), HTN (hypertension), OSA (obstructive sleep apnea), Rheumatoid arthritis ([place]), Right kidney mass, 2.8 cm (11/2020), and Tobacco abuse.   Surgical History:   Past Surgical History:  Procedure Laterality Date   left index finger surgery Left      Social History:   reports that he has been smoking cigarettes. He has been smoking an average of 1 pack per day. He has never used smokeless tobacco. He reports that he does not currently use alcohol. He reports that he does not use drugs.   Family History:  His family history includes Diabetes Mellitus II in his father; Schizophrenia in his mother.   Allergies No Known Allergies   Home Medications  Prior to Admission medications   Medication Sig Start Date End Date Taking? Authorizing Provider  albuterol (VENTOLIN HFA) 108 (90 Base) MCG/ACT inhaler Inhale 2 puffs into the lungs every 6  hours as needed for wheezing or shortness of breath. 01/20/21  Yes [name], MD  atorvastatin (LIPITOR) 10 MG tablet Take 1 tablet (10 mg total) by mouth once daily. 06/24/21  Yes [name], MD  budesonide-formoterol [facility]) 160-4.5 MCG/ACT  inhaler Inhale 2 puffs into the lungs 2 (two) times daily. 03/24/21  Yes Elodia Florence., MD  ciprofloxacin (CILOXAN) 0.3 % ophthalmic solution Place 1 drop into the right eye every 4 (four) hours while awake. 06/23/21  Yes [name], MD  dapagliflozin propanediol (FARXIGA) 10 MG TABS tablet Take 1 tablet (10 mg total) by mouth daily. 07/09/21  Yes [name], MD  DULoxetine (CYMBALTA) 60 MG capsule Take 1 capsule (60 mg total) by mouth daily. 06/24/21  Yes [name], MD  famotidine (PEPCID) 20 MG tablet Take 1 tablet (20 mg total) by mouth 2 (two) times daily. 06/24/21  Yes [name], MD  fluticasone (FLONASE) 50 MCG/ACT nasal spray Place 2 sprays into both nostrils daily. 01/13/21  Yes [name], MD  furosemide (LASIX) 40 MG tablet Take 1 tablet (40 mg total) by mouth 2 (two) times daily. 07/26/21 08/25/21 Yes Antonieta Pert, MD  gabapentin (NEURONTIN) 300 MG capsule Take 2 capsules in the morning AND 1 capsule midday AND 2 capsules every evening. 06/10/21  Yes [name], MD  hydrOXYzine (ATARAX) 25 MG tablet Take 1 tablet (25 mg total) by mouth 3 (three) times daily as needed. 06/24/21  Yes [name], MD  insulin aspart (NOVOLOG) 100 UNIT/ML FlexPen Inject 12 Units into the skin 3 (three) times daily with meals. 07/26/21  Yes Kc, Maren Beach, MD  insulin glargine (LANTUS) 100 UNIT/ML Solostar Pen Inject 35 Units into the skin daily. 07/26/21 10/20/21 Yes Antonieta Pert, MD  linagliptin (TRADJENTA) 5 MG TABS tablet Take 1 tablet (5 mg total) by mouth daily. 07/27/21 08/26/21 Yes Antonieta Pert, MD  metFORMIN (GLUCOPHAGE) 500 MG tablet Take 2 tablets (1,000 mg total) by mouth 2 (two) times daily with a meal. 06/10/21  Yes [name], MD  Multiple Vitamins-Minerals (PRESERVISION AREDS 2) CAPS Take 1 capsule by mouth 2 (two) times daily with food. Take 1 in the morning and 1 in the evening. 04/29/21  Yes [name], MD  olopatadine (PATANOL) 0.1 % ophthalmic  solution Place 1 drop into the right eye 2 (two) times daily. 03/25/21  Yes [name], MD  risperidone (RISPERDAL) 4 MG tablet Take 1 tablet (4 mg total) by  mouth once daily. 06/10/21  Yes [name], MD  tamsulosin (FLOMAX) 0.4 MG CAPS capsule Take 1 capsule (0.4 mg total) by mouth daily. 06/10/21  Yes [name], MD  Blood Glucose Monitoring Suppl (ACCU-CHEK GUIDE) w/Device KIT Use up to four times daily as directed. 07/26/21   Antonieta Pert, MD  Fingerstix Lancets MISC Use as directed up to 4 times daily. 07/26/21   Antonieta Pert, MD  glucose blood test strip Use four times daily as directed. 07/26/21   Antonieta Pert, MD  Insulin Pen Needle 32G X 4 MM MISC Use with insulin pens. 07/26/21 08/25/21  Antonieta Pert, MD     Critical care time: Ferol Luz Halia Franey ACNP Acute Care Nurse Practitioner [place] Please consult Amion 07/28/2021, 8:55 AM

## 2021-07-28 NOTE — Progress Notes (Signed)
PROGRESS NOTE    Raymond Hood  MPN:[id number] DOB: 08-08-1970 DOA: 07/26/2021 PCP: Storm Frisk, MD   Brief Narrative:  51 year old with history of COPD, OSA, diastolic CHF, HTN, DM2, schizoaffective disorder, anxiety, nicotine dependence came to the hospital with shortness of breath and dizziness.  Patient was admitted at [facility] from 7/5 - 7/17 for acute hypoxia of from pneumonia, COPD and CHF exacerbation which was treated with bronchodilators, steroids and diuretics.  About 25 L of fluid was taken off.  After being discharged patient went to [facility] where he was outside in the heat for several hours thereafter came to the hospital.  Upon admission noted to have acute kidney injury with creatinine 1.44, baseline 0.7.   Assessment & Plan:  Principal Problem:   AKI (acute kidney injury) (HCC) Active Problems:   COPD with acute exacerbation (HCC)   Chronic diastolic heart failure (HCC)   Type 2 diabetes mellitus with hyperglycemia, with long-term current use of insulin (HCC)   GERD (gastroesophageal reflux disease)   Schizo affective schizophrenia (HCC)   OSA/OHS     Assessment and Plan: * AKI (acute kidney injury) (HCC) - Volume depletion, creatinine peaked at 1.44, approaching baseline of 0.8.  COPD with acute exacerbation (HCC) -Bronchodilators, prednisone- will taper rapidly.  Seen by Pulm.   Chronic diastolic heart failure (HCC) -BNP is normal  Cardiology consulted  Type 2 diabetes mellitus with hyperglycemia, with long-term current use of insulin (HCC) Peripheral neuropathy -A1c 8.9.  Continue Farxiga and metformin.  He is having trouble taking basal bolus dosing therefore will transition to 70/30 25 units twice daily  -Gabapentin  GERD (gastroesophageal reflux disease) -Pepcid  Schizo affective schizophrenia (HCC) -On home meds-Cymbalta, risperidone  OSA/OHS -Supplemental oxygen    DVT prophylaxis: Lovenox Code Status: Full code Family  Communication:    Currently awaiting cardiology evaluation, in the meantime pending TOC eval for safe disposition planning.    Subjective: Feels ok, overall weak.   I spoke with Dr Delford Field earlier today, who was concerned about his safe discharge planning as [facility] under [facility] for another couple of days, but he is able to go to a [employer] after hours. Pulm and Cardiology were also consulted to help optimize his medications.    Examination: Constitutional: Not in acute distress; poor hygiene, Morbidly obese.  Respiratory: Clear to auscultation bilaterally Cardiovascular: Normal sinus rhythm, no rubs Abdomen: Nontender nondistended good bowel sounds Musculoskeletal: No edema noted Skin: some skin abrasion/scabbing over b/l knees.  Neurologic: CN 2-12 grossly intact.  And nonfocal Psychiatric: Normal judgment and insight. Alert and oriented x 3. Normal mood.    Objective: Vitals:   07/28/21 0450 07/28/21 0826 07/28/21 0919 07/28/21 1302  BP: 111/81  139/78   Pulse: (!) 104  (!) 103   Resp: 20  18   Temp: 98 F (36.7 C)  98.3 F (36.8 C)   TempSrc:      SpO2: 97% 99% 95% 97%  Weight:      Height:        Intake/Output Summary (Last 24 hours) at 07/28/2021 1352 Last data filed at 07/28/2021 1018 Gross per 24 hour  Intake 1135 ml  Output 900 ml  Net 235 ml   Filed Weights   07/26/21 2050  Weight: (!) 174.8 kg     Data Reviewed:   CBC: Recent Labs  Lab 07/26/21 2237 07/27/21 0829  WBC 15.0* 12.4*  NEUTROABS 11.9* 9.4*  HGB 15.2 16.1  HCT 50.0  50.8  MCV 86.1 84.9  PLT 209 202   Basic Metabolic Panel: Recent Labs  Lab 07/23/21 0234 07/24/21 0201 07/26/21 2237 07/27/21 1033 07/28/21 0517  NA 137 138 136 140 139  K 3.5 3.8 4.2 4.2 3.9  CL 96* 97* 98 103 105  CO2 GLUCOSE 158* 142* 227* 202* 231*  BUN 28* 23* CREATININE 0.92 0.72 1.44* 0.95 0.84  CALCIUM 8.4* 8.6* 8.5* 8.2* 8.4*  MG  --   --   --    --  2.2   GFR: Estimated Creatinine Clearance: 168.8 mL/min (by C-G formula based on SCr of 0.84 mg/dL). Liver Function Tests: Recent Labs  Lab 07/27/21 1033  AST 16  ALT 22  ALKPHOS 60  BILITOT 0.6  PROT 5.7*  ALBUMIN 2.7*   No results for input(s): "LIPASE", "AMYLASE" in the last 168 hours. No results for input(s): "AMMONIA" in the last 168 hours. Coagulation Profile: No results for input(s): "INR", "PROTIME" in the last 168 hours. Cardiac Enzymes: Recent Labs  Lab 07/26/21 2237  CKTOTAL 27*   BNP (last 3 results) No results for input(s): "PROBNP" in the last 8760 hours. HbA1C: Recent Labs    07/27/21 0625  HGBA1C 8.9*   CBG: Recent Labs  Lab 07/27/21 1129 07/27/21 1636 07/27/21 2339 07/28/21 0742 07/28/21 1130  GLUCAP 195* 239* 188* 189* 275*   Lipid Profile: No results for input(s): "CHOL", "HDL", "LDLCALC", "TRIG", "CHOLHDL", "LDLDIRECT" in the last 72 hours. Thyroid Function Tests: Recent Labs    07/27/21 0829  TSH 1.888   Anemia Panel: No results for input(s): "VITAMINB12", "FOLATE", "FERRITIN", "TIBC", "IRON", "RETICCTPCT" in the last 72 hours. Sepsis Labs: No results for input(s): "PROCALCITON", "LATICACIDVEN" in the last 168 hours.  No results found for this or any previous visit (from the past 240 hour(s)).       Radiology Studies: DG Chest Portable 1 View  Result Date: 07/26/2021 CLINICAL DATA:  Dyspnea EXAM: PORTABLE CHEST 1 VIEW COMPARISON:  07/15/2021 FINDINGS: Lungs are clear. No pneumothorax or pleural effusion. Cardiac size is mildly enlarged, stable since prior examination. Widening of the right paratracheal soft tissues relates to vascular shadow and prominent mediastinal fat better seen on CT examination of 10/20/2020. Pulmonary vascularity is normal. No acute bone abnormality. IMPRESSION: No active disease. Electronically Signed   By: Helyn Numbers M.D.   On: 07/26/2021 22:18        Scheduled Meds:  atorvastatin  10 mg  Oral Daily   ciprofloxacin  1 drop Right Eye Q4H while awake   DULoxetine  60 mg Oral Daily   enoxaparin (LOVENOX) injection  80 mg Subcutaneous Q24H   famotidine  20 mg Oral BID   fluticasone  2 spray Each Nare Daily   fluticasone furoate-vilanterol  1 puff Inhalation Daily   furosemide  40 mg Oral BID   gabapentin  300 mg Oral Q lunch   gabapentin  600 mg Oral q AM   gabapentin  600 mg Oral QHS   insulin aspart  0-15 Units Subcutaneous TID AC & HS   insulin aspart  12 Units Subcutaneous TID WC   insulin glargine-yfgn  35 Units Subcutaneous Daily   ipratropium-albuterol  3 mL Nebulization Q6H   olopatadine  1 drop Right Eye BID   predniSONE  50 mg Oral Q breakfast   risperidone  4 mg Oral Daily   tamsulosin  0.4 mg Oral Daily   Continuous Infusions:  LOS: 0 days   Time spent= 35 mins    Johni Narine Joline Maxcy, MD [facility]  If 7PM-7AM, please contact night-coverage  07/28/2021, 1:52 PM

## 2021-07-28 NOTE — Progress Notes (Addendum)
Inpatient Diabetes Program Recommendations  AACE/ADA: New Consensus Statement on Inpatient Glycemic Control (2015)  Target Ranges:  Prepandial:   less than 140 mg/dL      Peak postprandial:   less than 180 mg/dL (1-2 hours)      Critically ill patients:  140 - 180 mg/dL   Lab Results  Component Value Date   GLUCAP 275 (H) 07/28/2021   HGBA1C 8.9 (H) 07/27/2021    Review of Glycemic Control  Latest Reference Range & Units 07/27/21 09:20 07/27/21 11:29 07/27/21 16:36 07/27/21 23:39 07/28/21 07:42 07/28/21 11:30  Glucose-Capillary 70 - 99 mg/dL 818 (H) 299 (H) 371 (H) 188 (H) 189 (H) 275 (H)  (H): Data is abnormally high  Diabetes history: DM2 Outpatient Diabetes medications: Farxiga 10 mg QD, Metformin 1000 mg BID Current orders for Inpatient glycemic control: Semglee 35 units QAM, Novolog 12 units TID, Novolog 0-20 units TID and 0-5 units QHS, Prednisone 50 mg daily  Inpatient Diabetes Program Recommendations:    Postprandial glucose elevated today at 1130 (275 mg/dL).  If PP glucose consistently elevated please consider  increasing Novolog meal coverage to 15 units TID with meals if consumes at least 50%.    Receiving prednisone 50 mg daily.  Addendum @ 1321:  Dr. Nelson Chimes reached out to me about consolidating his insulin regimen as he currently resides at [facility].  He was started on basal bolus at discharge on 7/17 (Lantus QD and Novolog TID).  He has received Lantus 35 units this morning.  Please consider:  70/30 25 units BID starting tomorrow morning with breakfast.  This will provide 35 units of basal insulin and 7.5 units of meal coverage BID.  Spoke with patient at bedside regarding 70/30 insulin, administration and to take with BF and supper.  He states the physician at the shelter will be helping him with his insulin.    Will continue to follow while inpatient.  Thank you, Dulce Sellar, MSN, CDCES Diabetes Coordinator Inpatient Diabetes Program [phone] (team  pager from 8a-5p)

## 2021-07-28 NOTE — Plan of Care (Signed)
  Problem: Coping: Goal: Ability to adjust to condition or change in health will improve Outcome: Completed/Met   Problem: Fluid Volume: Goal: Ability to maintain a balanced intake and output will improve Outcome: Completed/Met   Problem: Health Behavior/Discharge Planning: Goal: Ability to identify and utilize available resources and services will improve Outcome: Completed/Met

## 2021-07-29 DIAGNOSIS — N179 Acute kidney failure, unspecified: Secondary | ICD-10-CM | POA: Diagnosis not present

## 2021-07-29 LAB — BASIC METABOLIC PANEL
Anion gap: 11 (ref 5–15)
BUN: 15 mg/dL (ref 6–20)
CO2: 27 mmol/L (ref 22–32)
Calcium: 8.5 mg/dL — ABNORMAL LOW (ref 8.9–10.3)
Chloride: 104 mmol/L (ref 98–111)
Creatinine, Ser: 0.87 mg/dL (ref 0.61–1.24)
GFR, Estimated: >60 mL/min (ref >60)
Glucose, Bld: 164 mg/dL — ABNORMAL HIGH (ref 70–99)
Potassium: 3.9 mmol/L (ref 3.5–5.1)
Sodium: 142 mmol/L (ref 135–145)

## 2021-07-29 LAB — GLUCOSE, CAPILLARY
Glucose-Capillary: 162 mg/dL — ABNORMAL HIGH (ref 70–99)
Glucose-Capillary: 170 mg/dL — ABNORMAL HIGH (ref 70–99)
Glucose-Capillary: 335 mg/dL — ABNORMAL HIGH (ref 70–99)
Glucose-Capillary: 328 mg/dL — ABNORMAL HIGH (ref 70–99)

## 2021-07-29 LAB — MAGNESIUM: Magnesium: 2.1 mg/dL (ref 1.7–2.4)

## 2021-07-29 MED ORDER — INSULIN ASPART PROT & ASPART (70-30 MIX) 100 UNIT/ML ~~LOC~~ SUSP
25.0000 [IU] | Freq: Two times a day (BID) | SUBCUTANEOUS | Status: DC
  Administered 2021-07-29 – 2021-07-30 (×3): 25 [IU] via SUBCUTANEOUS
  Filled 2021-07-29 (×2): qty 10

## 2021-07-29 NOTE — Progress Notes (Signed)
Inpatient Diabetes Program Recommendations  AACE/ADA: New Consensus Statement on Inpatient Glycemic Control (2015)  Target Ranges:  Prepandial:   less than 140 mg/dL      Peak postprandial:   less than 180 mg/dL (1-2 hours)      Critically ill patients:  140 - 180 mg/dL   Lab Results  Component Value Date   GLUCAP 162 (H) 07/29/2021   HGBA1C 8.9 (H) 07/27/2021    Review of Glycemic Control  Latest Reference Range & Units 07/28/21 07:42 07/28/21 11:30 07/28/21 16:22 07/28/21 20:36 07/29/21 07:21  Glucose-Capillary 70 - 99 mg/dL 371 (H) 696 (H) 789 (H) 295 (H) 162 (H)   Diabetes history: DM2 Outpatient Diabetes medications: Farxiga 10 mg QD, Metformin 1000 mg BID Current orders for Inpatient glycemic control: Semglee 35 units QAM, Novolog 12 units TID, Novolog 0-20 units TID and 0-5 units QHS, Prednisone 50 mg daily  Inpatient Diabetes Program Recommendations:    -  Increase Novolog meal coverage to 16 units tid if eating >50% of meals   Will continue to follow while inpatient.  Thanks, Christena Deem RN, MSN, BC-ADM Inpatient Diabetes Coordinator Team Pager [phone] (8a-5p)

## 2021-07-29 NOTE — Progress Notes (Addendum)
Physical Therapy Treatment Patient Details Name: Raymond Hood MRN: [id number] DOB: Jul 20, 1970 Today's Date: 07/29/2021   History of Present Illness Pt is a 51 y/o male admitted secondary to SOB and dizziness on 7/17. Pt recently discharged from hospital on 7/17 secondary to PNA. PMH includes COPD, schizoaffective disorder, DM, dCHF, and nicotine dependence.    PT Comments    Patient seen for OOB and ambulation with orthostatic BPs assessed (they were negative). Patient did report dizziness upon initial supine to sit with duration >1 minute and reported feeling was lightheadedness. Denied dizziness upon standing. Was able to ambulate 60 ft with single point cane with no imbalance noted. Will continue to follow to achieve modified independent goals.   Supine BP  115/82 (94) HR 101 Sit BP         135/101 (112) HR 96 Stand BP    130/84 (95)  HR 98    Recommendations for follow up therapy are one component of a multi-disciplinary discharge planning process, led by the attending physician.  Recommendations may be updated based on patient status, additional functional criteria and insurance authorization.  Follow Up Recommendations  No PT follow up Can patient physically be transported by private vehicle: Yes   Assistance Recommended at Discharge PRN  Patient can return home with the following A little help with bathing/dressing/bathroom;Assist for transportation   Equipment Recommendations  None recommended by PT    Recommendations for Other Services       Precautions / Restrictions Precautions Precautions: Fall Precaution Comments: watch O2 Restrictions Weight Bearing Restrictions: No     Mobility  Bed Mobility Overal bed mobility: Needs Assistance Bed Mobility: Supine to Sit, Sit to Supine     Supine to sit: Min assist Sit to supine: Modified independent (Device/Increase time)   General bed mobility comments: assist to raise trunk to come to sit    Transfers Overall  transfer level: Needs assistance Equipment used: Straight cane Transfers: Sit to/from Stand Sit to Stand: Min guard           General transfer comment: minguard for safety due to reported dizziness    Ambulation/Gait Ambulation/Gait assistance: Supervision Gait Distance (Feet): 60 Feet Assistive device: Straight cane Gait Pattern/deviations: Step-through pattern, Trunk flexed Gait velocity: decreased     General Gait Details: slow steady gait   Stairs             Wheelchair Mobility    Modified Rankin (Stroke Patients Only)       Balance   Sitting-balance support: No upper extremity supported, Feet supported Sitting balance-Leahy Scale: Good     Standing balance support: During functional activity, Single extremity supported Standing balance-Leahy Scale: Fair                              Cognition Arousal/Alertness: Awake/alert Behavior During Therapy: Flat affect Overall Cognitive Status: No family/caregiver present to determine baseline cognitive functioning                                 General Comments: hx of schizoaffective/bipolar disorder. flat affect but follows directions consistently, endorses memory deficits        Exercises      General Comments General comments (skin integrity, edema, etc.): patient sleeping on arrival and irritable after awakened but agreed to activity. Not orthostatic      Pertinent Vitals/Pain Pain Assessment Pain  Assessment: No/denies pain    Home Living                          Prior Function            PT Goals (current goals can now be found in the care plan section) Acute Rehab PT Goals Patient Stated Goal: to get better Time For Goal Achievement: 08/10/21 Potential to Achieve Goals: Good Progress towards PT goals: Progressing toward goals    Frequency    Min 3X/week      PT Plan Discharge plan needs to be updated    Co-evaluation               AM-PAC PT "6 Clicks" Mobility   Outcome Measure  Help needed turning from your back to your side while in a flat bed without using bedrails?: None Help needed moving from lying on your back to sitting on the side of a flat bed without using bedrails?: A Little Help needed moving to and from a bed to a chair (including a wheelchair)?: A Little Help needed standing up from a chair using your arms (e.g., wheelchair or bedside chair)?: A Little Help needed to walk in hospital room?: A Little Help needed climbing 3-5 steps with a railing? : A Little 6 Click Score: 19    End of Session   Activity Tolerance: Patient limited by fatigue Patient left: in bed;with call bell/phone within reach (on stretcher in ED) Nurse Communication: Mobility status PT Visit Diagnosis: Other abnormalities of gait and mobility (R26.89);Unsteadiness on feet (R26.81);Muscle weakness (generalized) (M62.81)     Time: 1660-6301 PT Time Calculation (min) (ACUTE ONLY): 13 min  Charges:  $Gait Training: 8-22 mins                      Jerolyn Center, PT Acute Rehabilitation Services  Office [phone]    Zena Amos 07/29/2021, 2:59 PM

## 2021-07-29 NOTE — Progress Notes (Signed)
Occupational Therapy Treatment Patient Details Name: Raymond Hood MRN: [id number] DOB: [birth year] Today's Date: 07/29/2021   History of present illness Pt is a 51 y/o male admitted secondary to SOB and dizziness on 7/17. Pt recently discharged from hospital on 7/17 secondary to PNA. PMH includes COPD, schizoaffective disorder, DM, dCHF, and nicotine dependence.   OT comments  This 51 yo male admitted with above seen today to go over use of long handled sponge for bathing and toilet aid to A him in being able to more readily reach his back peri area. Pt practiced with toilet aid. Both pieces of AE were given to him.   Recommendations for follow up therapy are one component of a multi-disciplinary discharge planning process, led by the attending physician.  Recommendations may be updated based on patient status, additional functional criteria and insurance authorization.    Follow Up Recommendations  No OT follow up    Assistance Recommended at Discharge Intermittent Supervision/Assistance  Patient can return home with the following  A little help with walking and/or transfers;A little help with bathing/dressing/bathroom;Assistance with cooking/housework;Assist for transportation;Help with stairs or ramp for entrance   Equipment Recommendations  None recommended by OT       Precautions / Restrictions Precautions Precautions: Fall Precaution Comments: watch O2 Restrictions Weight Bearing Restrictions: No       Mobility Bed Mobility Overal bed mobility: Needs Assistance Bed Mobility: Supine to Sit, Sit to Supine     Supine to sit: Min assist (for trunk) Sit to supine: Modified independent (Device/Increase time) (increased time)        Transfers Overall transfer level: Needs assistance Equipment used: None Transfers: Sit to/from Stand Sit to Stand: Min guard                 Balance Overall balance assessment: Needs assistance Sitting-balance support: No upper  extremity supported, Feet supported Sitting balance-Leahy Scale: Good     Standing balance support: During functional activity, Single extremity supported Standing balance-Leahy Scale: Fair                             ADL either performed or assessed with clinical judgement   ADL Overall ADL's : Needs assistance/impaired                                       General ADL Comments: Issued pt a long handled sponge and toilet aid. Pt was able to put wash cloth on toilet aid and use it to simulate posterior hygiene care.    Extremity/Trunk Assessment Upper Extremity Assessment Upper Extremity Assessment: Overall WFL for tasks assessed            Vision Patient Visual Report: No change from baseline            Cognition Arousal/Alertness: Awake/alert Behavior During Therapy: Flat affect Overall Cognitive Status: No family/caregiver present to determine baseline cognitive functioning                                 General Comments: hx of schizoaffective/bipolar disorder. flat affect but follows directions consistently                   Pertinent Vitals/ Pain       Pain Assessment Pain Assessment: No/denies pain  Frequency  Min 2X/week        Progress Toward Goals  OT Goals(current goals can now be found in the care plan section)  Progress towards OT goals: Progressing toward goals  Acute Rehab OT Goals Patient Stated Goal: to go to ALF OT Goal Formulation: With patient Time For Goal Achievement: 08/11/21  Plan Discharge plan remains appropriate       AM-PAC OT "6 Clicks" Daily Activity     Outcome Measure   Help from another person eating meals?: None Help from another person taking care of personal grooming?: A Little Help from another person toileting, which includes using toliet, bedpan, or urinal?: A Little Help from another person bathing (including washing, rinsing, drying)?: A Little Help  from another person to put on and taking off regular upper body clothing?: A Little Help from another person to put on and taking off regular lower body clothing?: A Little 6 Click Score: 19    End of Session    OT Visit Diagnosis: Unsteadiness on feet (R26.81);Other abnormalities of gait and mobility (R26.89);Muscle weakness (generalized) (M62.81);Other symptoms and signs involving cognitive function   Activity Tolerance Patient tolerated treatment well   Patient Left in bed;with call bell/phone within reach           Time: 1215-1231 OT Time Calculation (min): 16 min  Charges: OT General Charges $OT Visit: 1 Visit OT Treatments $Self Care/Home Management : 8-22 mins  Ignacia Palma, OTR/L Acute Rehab Services [facility] [phone] Office [phone]    Evette Georges 07/29/2021, 10:43 PM

## 2021-07-29 NOTE — Progress Notes (Signed)
PROGRESS NOTE    Raymond Hood  VPX:[id number] DOB: [birth year] DOA: 07/26/2021 PCP: Storm Frisk, MD   Brief Narrative:  51 year old with history of COPD, OSA, diastolic CHF, HTN, DM2, schizoaffective disorder, anxiety, nicotine dependence came to the hospital with shortness of breath and dizziness.  Patient was admitted at [facility] from 7/5 - 7/17 for acute hypoxia of from pneumonia, COPD and CHF exacerbation which was treated with bronchodilators, steroids and diuretics.  About 25 L of fluid was taken off.  After being discharged patient went to [facility] where he was outside in the heat for several hours thereafter came to the hospital.  Upon admission noted to have acute kidney injury with creatinine 1.44, baseline 0.7.   Assessment & Plan:  Principal Problem:   AKI (acute kidney injury) (HCC) Active Problems:   COPD with acute exacerbation (HCC)   Chronic diastolic heart failure (HCC)   Type 2 diabetes mellitus with hyperglycemia, with long-term current use of insulin (HCC)   GERD (gastroesophageal reflux disease)   Schizo affective schizophrenia (HCC)   OSA/OHS     Assessment and Plan: * AKI (acute kidney injury) (HCC) - Volume depletion, creatinine peaked at 1.44, approaching baseline of 0.8.  COPD with acute exacerbation (HCC) -Bronchodilators, prednisone- will taper rapidly.  Seen by Pulm.   Chronic diastolic heart failure (HCC) -BNP is normal  Cardiology consulted. Resume home oral diuretics upon dc.   Type 2 diabetes mellitus with hyperglycemia, with long-term current use of insulin (HCC) Peripheral neuropathy -A1c 8.9.  Continue Farxiga and metformin.  He is having trouble taking basal bolus dosing therefore will transition to 70/30 25 units twice daily  -Gabapentin  GERD (gastroesophageal reflux disease) -Pepcid  Schizo affective schizophrenia (HCC) -On home meds-Cymbalta, risperidone  OSA/OHS -Supplemental oxygen    DVT prophylaxis: Lovenox Code  Status: Full code Family Communication:    Currently awaiting cardiology evaluation, in the meantime pending TOC eval for safe disposition planning.    Subjective: Feels little better. No complaints.   Examination: Constitutional: Not in acute distress; morbidly obese Respiratory: Clear to auscultation bilaterally Cardiovascular: Normal sinus rhythm, no rubs Abdomen: Nontender nondistended good bowel sounds Musculoskeletal: No edema noted Skin: No rashes seen Neurologic: CN 2-12 grossly intact.  And nonfocal Psychiatric: Normal judgment and insight. Alert and oriented x 3. Normal mood.     Objective: Vitals:   07/29/21 0208 07/29/21 0510 07/29/21 0833 07/29/21 0859  BP:  110/75 115/79   Pulse:  89 93 (!) 103  Resp:  18 18 18   Temp:  98.9 F (37.2 C) 98.2 F (36.8 C)   TempSrc:  Oral    SpO2: 97% 92% 95% 91%  Weight:      Height:        Intake/Output Summary (Last 24 hours) at 07/29/2021 1252 Last data filed at 07/29/2021 1211 Gross per 24 hour  Intake 2175 ml  Output 3250 ml  Net -1075 ml   Filed Weights   07/26/21 2050  Weight: (!) 174.8 kg     Data Reviewed:   CBC: Recent Labs  Lab 07/26/21 2237 07/27/21 0829  WBC 15.0* 12.4*  NEUTROABS 11.9* 9.4*  HGB 15.2 16.1  HCT 50.0 50.8  MCV 86.1 84.9  PLT 209 202   Basic Metabolic Panel: Recent Labs  Lab 07/24/21 0201 07/26/21 2237 07/27/21 1033 07/28/21 0517 07/29/21 0529  NA 138 136 140 139 142  K 3.8 4.2 4.2 3.9 3.9  CL 97* 98 103 105 104  CO2 30  27 28 28 27   GLUCOSE 142* 227* 202* 231* 164*  BUN 23* 19 13 10 15   CREATININE 0.72 1.44* 0.95 0.84 0.87  CALCIUM 8.6* 8.5* 8.2* 8.4* 8.5*  MG  --   --   --  2.2 2.1   GFR: Estimated Creatinine Clearance: 163 mL/min (by C-G formula based on SCr of 0.87 mg/dL). Liver Function Tests: Recent Labs  Lab 07/27/21 1033  AST 16  ALT 22  ALKPHOS 60  BILITOT 0.6  PROT 5.7*  ALBUMIN 2.7*   No results for input(s): "LIPASE", "AMYLASE" in the last  168 hours. No results for input(s): "AMMONIA" in the last 168 hours. Coagulation Profile: No results for input(s): "INR", "PROTIME" in the last 168 hours. Cardiac Enzymes: Recent Labs  Lab 07/26/21 2237  CKTOTAL 27*   BNP (last 3 results) No results for input(s): "PROBNP" in the last 8760 hours. HbA1C: Recent Labs    07/27/21 0625  HGBA1C 8.9*   CBG: Recent Labs  Lab 07/28/21 1130 07/28/21 1622 07/28/21 2036 07/29/21 0721 07/29/21 1133  GLUCAP 275* 269* 295* 162* 170*   Lipid Profile: No results for input(s): "CHOL", "HDL", "LDLCALC", "TRIG", "CHOLHDL", "LDLDIRECT" in the last 72 hours. Thyroid Function Tests: Recent Labs    07/27/21 0829  TSH 1.888   Anemia Panel: No results for input(s): "VITAMINB12", "FOLATE", "FERRITIN", "TIBC", "IRON", "RETICCTPCT" in the last 72 hours. Sepsis Labs: No results for input(s): "PROCALCITON", "LATICACIDVEN" in the last 168 hours.  No results found for this or any previous visit (from the past 240 hour(s)).       Radiology Studies: No results found.      Scheduled Meds:  atorvastatin  10 mg Oral Daily   DULoxetine  60 mg Oral Daily   enoxaparin (LOVENOX) injection  80 mg Subcutaneous Q24H   famotidine  20 mg Oral BID   fluticasone  2 spray Each Nare Daily   fluticasone furoate-vilanterol  1 puff Inhalation Daily   furosemide  40 mg Oral BID   gabapentin  300 mg Oral Q lunch   gabapentin  600 mg Oral q AM   gabapentin  600 mg Oral QHS   insulin aspart  0-15 Units Subcutaneous TID AC & HS   insulin aspart  12 Units Subcutaneous TID WC   insulin glargine-yfgn  35 Units Subcutaneous Daily   ipratropium-albuterol  3 mL Nebulization Q6H   predniSONE  50 mg Oral Q breakfast   risperidone  4 mg Oral Daily   tamsulosin  0.4 mg Oral Daily   Continuous Infusions:     LOS: 0 days   Time spent= 35 mins    Annalie Wenner 07/31/21, MD [facility]  If 7PM-7AM, please contact night-coverage  07/29/2021, 12:52 PM

## 2021-07-30 ENCOUNTER — Other Ambulatory Visit (HOSPITAL_COMMUNITY): Payer: Self-pay

## 2021-07-30 DIAGNOSIS — N179 Acute kidney failure, unspecified: Secondary | ICD-10-CM | POA: Diagnosis not present

## 2021-07-30 LAB — BASIC METABOLIC PANEL
Anion gap: 7 (ref 5–15)
BUN: 14 mg/dL (ref 6–20)
CO2: 30 mmol/L (ref 22–32)
Calcium: 8.4 mg/dL — ABNORMAL LOW (ref 8.9–10.3)
Chloride: 101 mmol/L (ref 98–111)
Creatinine, Ser: 0.9 mg/dL (ref 0.61–1.24)
GFR, Estimated: >60 mL/min (ref >60)
Glucose, Bld: 235 mg/dL — ABNORMAL HIGH (ref 70–99)
Potassium: 3.8 mmol/L (ref 3.5–5.1)
Sodium: 138 mmol/L (ref 135–145)

## 2021-07-30 LAB — GLUCOSE, CAPILLARY
Glucose-Capillary: 180 mg/dL — ABNORMAL HIGH (ref 70–99)
Glucose-Capillary: 126 mg/dL — ABNORMAL HIGH (ref 70–99)
Glucose-Capillary: 344 mg/dL — ABNORMAL HIGH (ref 70–99)

## 2021-07-30 LAB — MAGNESIUM: Magnesium: 2.3 mg/dL (ref 1.7–2.4)

## 2021-07-30 MED ORDER — INSULIN ASPART PROT & ASPART (70-30 MIX) 100 UNIT/ML PEN
30.0000 [IU] | PEN_INJECTOR | Freq: Two times a day (BID) | SUBCUTANEOUS | 1 refills | Status: DC
  Filled 2021-07-30: qty 12, 20d supply, fill #0

## 2021-07-30 MED ORDER — PREDNISONE 50 MG PO TABS
50.0000 mg | ORAL_TABLET | Freq: Every day | ORAL | 0 refills | Status: AC
  Filled 2021-07-30: qty 2, 2d supply, fill #0

## 2021-07-30 MED ORDER — POLYETHYLENE GLYCOL 3350 17 GM/SCOOP PO POWD
17.0000 g | Freq: Every day | ORAL | 0 refills | Status: DC | PRN
  Filled 2021-07-30: qty 238, 14d supply, fill #0

## 2021-07-30 NOTE — Discharge Summary (Signed)
Physician Discharge Summary  Raymond Hood NTZ:[id number] DOB: [birth year] DOA: 07/26/2021  PCP: [name], MD  Admit date: 07/26/2021 Discharge date: 07/30/2021  Admitted From: [place] Disposition:  [facility]  Recommendations for Outpatient Follow-up:  Follow up with PCP in 1-2 weeks Please obtain BMP/CBC in one week your next doctors visit.  Change Lantus and Premeal Novolog to 70/30 Insulin 30U BID. Continue home PO medications  2 more days of Oral Prednisone Bowel Regimen prn prescribed Continue home cardiac regimen. Follow up outpatient with Cardiology in 3-4 weeks.    Discharge Condition: Stable CODE STATUS: Full  Diet recommendation: Heart Healthy/Diabetic.   Brief/Interim Summary: 51 year old with history of COPD, OSA, diastolic CHF, HTN, DM2, schizoaffective disorder, anxiety, nicotine dependence came to the hospital with shortness of breath and dizziness.  Patient was admitted at [facility] from 7/5 - 7/17 for acute hypoxia of from pneumonia, COPD and CHF exacerbation which was treated with bronchodilators, steroids and diuretics.  About 25 L of fluid was taken off.  After being discharged patient went to [facility] where he was outside in the heat for several hours thereafter came to the hospital.  Upon admission noted to have acute kidney injury with creatinine 1.44, baseline 0.7.  Slowly with IV fluids his renal function returned back to baseline, he was seen by cardiology and pulmonary.  He no longer needed IV fluids and his renal function stabilized.  He was eventually resumed on his outpatient Lasix.  PT recommended no further follow-up necessary therefore he was cleared for discharge.     Assessment & Plan:  Principal Problem:   AKI (acute kidney injury) ([place]) Active Problems:   COPD with acute exacerbation (HCC)   Chronic diastolic heart failure (HCC)   Type 2 diabetes mellitus with hyperglycemia, with long-term current use of insulin (HCC)   GERD  (gastroesophageal reflux disease)   Schizo affective schizophrenia ([place])   OSA/OHS       Assessment and Plan: * AKI (acute kidney injury) ([place]) - Volume depletion, creatinine peaked at 1.44, approaching baseline of 0.8.   COPD with acute exacerbation (HCC) -Bronchodilators, prednisone- 2 more days Seen by Pulm.    Chronic diastolic heart failure (HCC) -BNP is normal  Cardiology consulted. Resume home oral diuretics upon dc.    Type 2 diabetes mellitus with hyperglycemia, with long-term current use of insulin (HCC) Peripheral neuropathy -A1c 8.9.  Continue Farxiga and metformin.  He is having trouble taking basal bolus dosing therefore will transition to 70/30 30 units twice daily. Seen by diabetic coordinator -Gabapentin   GERD (gastroesophageal reflux disease) -Pepcid   Schizo affective schizophrenia ([place]) -On home meds-Cymbalta, risperidone   OSA/OHS -Supplemental oxygen             Discharge Diagnoses:  Principal Problem:   AKI (acute kidney injury) ([place]) Active Problems:   COPD with acute exacerbation (HCC)   Chronic diastolic heart failure ([place])   Type 2 diabetes mellitus with hyperglycemia, with long-term current use of insulin (HCC)   GERD (gastroesophageal reflux disease)   Schizo affective schizophrenia ([place])   OSA/OHS      Consultations: [facility].   Subjective: No complaints, doing ok.   Discharge Exam: Vitals:   07/30/21 0012 07/30/21 0433  BP:  118/78  Pulse: 96 79  Resp: 18 18  Temp:  98.4 F (36.9 C)  SpO2: 96% 97%   Vitals:   07/29/21 1653 07/29/21 2045 07/30/21 0012 07/30/21 0433  BP: 128/79 123/79  118/78  Pulse:  96 94 96 79  Resp: 17 18 18 18   Temp: 98.2 F (36.8 C) 97.9 F (36.6 C)  98.4 F (36.9 C)  TempSrc:  Oral  Oral  SpO2: 96% 97% 96% 97%  Weight:      Height:        General: Pt is alert, awake, not in acute distress Cardiovascular: RRR, S1/S2 +, no rubs, no gallops Respiratory: CTA bilaterally, no  wheezing, no rhonchi Abdominal: Soft, NT, ND, bowel sounds + Extremities: no edema, no cyanosis  Discharge Instructions   Allergies as of 07/30/2021   No Known Allergies      Medication List     STOP taking these medications    Lantus SoloStar 100 UNIT/ML Solostar Pen Generic drug: insulin glargine   NovoLOG FlexPen 100 UNIT/ML FlexPen Generic drug: insulin aspart       TAKE these medications    Accu-Chek Guide test strip Generic drug: glucose blood Use four times daily as directed.   Accu-Chek Guide w/Device Kit Use up to four times daily as directed.   Accu-Chek Softclix Lancets lancets Use as directed up to 4 times daily.   albuterol 108 (90 Base) MCG/ACT inhaler Commonly known as: VENTOLIN HFA Inhale 2 puffs into the lungs every 6  hours as needed for wheezing or shortness of breath.   atorvastatin 10 MG tablet Commonly known as: LIPITOR Take 1 tablet (10 mg total) by mouth once daily.   BD Pen Needle Nano U/F 32G X 4 MM Misc Generic drug: Insulin Pen Needle Use with insulin pens.   ciprofloxacin 0.3 % ophthalmic solution Commonly known as: Ciloxan Place 1 drop into the right eye every 4 (four) hours while awake.   dapagliflozin propanediol 10 MG Tabs tablet Commonly known as: FARXIGA Take 1 tablet (10 mg total) by mouth daily.   DULoxetine 60 MG capsule Commonly known as: CYMBALTA Take 1 capsule (60 mg total) by mouth daily.   famotidine 20 MG tablet Commonly known as: PEPCID Take 1 tablet (20 mg total) by mouth 2 (two) times daily.   fluticasone 50 MCG/ACT nasal spray Commonly known as: FLONASE Place 2 sprays into both nostrils daily.   furosemide 40 MG tablet Commonly known as: LASIX Take 1 tablet (40 mg total) by mouth 2 (two) times daily.   gabapentin 300 MG capsule Commonly known as: NEURONTIN Take 2 capsules in the morning AND 1 capsule midday AND 2 capsules every evening.   hydrOXYzine 25 MG tablet Commonly known as:  ATARAX Take 1 tablet (25 mg total) by mouth 3 (three) times daily as needed.   insulin aspart protamine - aspart (70-30) 100 UNIT/ML FlexPen Commonly known as: NOVOLOG 70/30 MIX Inject 30 Units into the skin 2 (two) times daily with a meal.   metFORMIN 500 MG tablet Commonly known as: GLUCOPHAGE Take 2 tablets (1,000 mg total) by mouth 2 (two) times daily with a meal.   olopatadine 0.1 % ophthalmic solution Commonly known as: Patanol Place 1 drop into the right eye 2 (two) times daily.   polyethylene glycol 17 g packet Commonly known as: MIRALAX / GLYCOLAX Take 17 g by mouth daily as needed for moderate constipation or severe constipation.   predniSONE 50 MG tablet Commonly known as: DELTASONE Take 1 tablet (50 mg total) by mouth daily with breakfast for 2 days.   PreserVision AREDS 2 Caps Take 1 capsule by mouth 2 (two) times daily with food. Take 1 in the morning and 1 in the evening.  risperidone 4 MG tablet Commonly known as: RISPERDAL Take 1 tablet (4 mg total) by mouth once daily.   Symbicort 160-4.5 MCG/ACT inhaler Generic drug: budesonide-formoterol Inhale 2 puffs into the lungs 2 (two) times daily.   tamsulosin 0.4 MG Caps capsule Commonly known as: FLOMAX Take 1 capsule (0.4 mg total) by mouth daily.   Tradjenta 5 MG Tabs tablet Generic drug: linagliptin Take 1 tablet (5 mg total) by mouth daily.        No Known Allergies  You were cared for by a hospitalist during your hospital stay. If you have any questions about your discharge medications or the care you received while you were in the hospital after you are discharged, you can call the unit and asked to speak with the hospitalist on call if the hospitalist that took care of you is not available. Once you are discharged, your primary care physician will handle any further medical issues. Please note that no refills for any discharge medications will be authorized once you are discharged, as it is  imperative that you return to your primary care physician (or establish a relationship with a primary care physician if you do not have one) for your aftercare needs so that they can reassess your need for medications and monitor your lab values.   Procedures/Studies: DG Chest Portable 1 View  Result Date: 07/26/2021 CLINICAL DATA:  Dyspnea EXAM: PORTABLE CHEST 1 VIEW COMPARISON:  07/15/2021 FINDINGS: Lungs are clear. No pneumothorax or pleural effusion. Cardiac size is mildly enlarged, stable since prior examination. Widening of the right paratracheal soft tissues relates to vascular shadow and prominent mediastinal fat better seen on CT examination of 10/20/2020. Pulmonary vascularity is normal. No acute bone abnormality. IMPRESSION: No active disease. Electronically Signed   By: Fidela Salisbury M.D.   On: 07/26/2021 22:18   VAS [place] LOWER EXTREMITY VENOUS (DVT)  Result Date: 07/20/2021  Lower Venous DVT Study Patient Name:  Raymond Hood  Date of Exam:   07/20/2021 Medical Rec #: [id number]      Accession #:    [id number] Date of Birth: December 04, 1970      Patient Gender: M Patient Age:   64 years Exam Location:  [facility] Procedure:      VAS [place] LOWER EXTREMITY VENOUS (DVT) Referring Phys: Oren Binet --------------------------------------------------------------------------------  Indications: Swelling.  Risk Factors: Obesity. Limitations: Body habitus, poor ultrasound/tissue interface and patient cooperation. Comparison Study: Previous exam on 10/20/20 was negative for DVT Performing Technologist: Rogelia Rohrer RVT, RDMS  Examination Guidelines: A complete evaluation includes B-mode imaging, spectral Doppler, color Doppler, and power Doppler as needed of all accessible portions of each vessel. Bilateral testing is considered an integral part of a complete examination. Limited examinations for reoccurring indications may be performed as noted. The reflux portion of the exam is performed with the  patient in reverse Trendelenburg.  +---------+---------------+---------+-----------+----------+--------------+ RIGHT    CompressibilityPhasicitySpontaneityPropertiesThrombus Aging +---------+---------------+---------+-----------+----------+--------------+ CFV      Full           Yes      Yes                                 +---------+---------------+---------+-----------+----------+--------------+ SFJ      Full                                                        +---------+---------------+---------+-----------+----------+--------------+  FV Prox  Full           Yes      Yes                                 +---------+---------------+---------+-----------+----------+--------------+ FV Mid   Full           Yes      Yes                                 +---------+---------------+---------+-----------+----------+--------------+ FV DistalFull           Yes      Yes                                 +---------+---------------+---------+-----------+----------+--------------+ PFV      Full                                                        +---------+---------------+---------+-----------+----------+--------------+ POP      Full           Yes      Yes                                 +---------+---------------+---------+-----------+----------+--------------+ PTV      Full                                                        +---------+---------------+---------+-----------+----------+--------------+ PERO     Full                                                        +---------+---------------+---------+-----------+----------+--------------+   +---------+---------------+---------+-----------+----------+-------------------+ LEFT     CompressibilityPhasicitySpontaneityPropertiesThrombus Aging      +---------+---------------+---------+-----------+----------+-------------------+ CFV      Full           Yes      Yes                                       +---------+---------------+---------+-----------+----------+-------------------+ SFJ      Full                                                             +---------+---------------+---------+-----------+----------+-------------------+ FV Prox  Full           Yes      Yes                                      +---------+---------------+---------+-----------+----------+-------------------+  FV Mid   Full           Yes      Yes                                      +---------+---------------+---------+-----------+----------+-------------------+ FV DistalFull           Yes      Yes                                      +---------+---------------+---------+-----------+----------+-------------------+ PFV      Full                                                             +---------+---------------+---------+-----------+----------+-------------------+ POP      Full           Yes      Yes                                      +---------+---------------+---------+-----------+----------+-------------------+ PTV      Full                                                             +---------+---------------+---------+-----------+----------+-------------------+ PERO                                                  Not well visualized +---------+---------------+---------+-----------+----------+-------------------+     Summary: BILATERAL: - No evidence of deep vein thrombosis seen in the lower extremities, bilaterally. -No evidence of popliteal cyst, bilaterally.   *See table(s) above for measurements and observations. Electronically signed by Servando Snare MD on 07/20/2021 at 2:59:37 PM.    Final    ECHOCARDIOGRAM COMPLETE  Result Date: 07/15/2021    ECHOCARDIOGRAM REPORT   Patient Name:   Raymond Hood Date of Exam: 07/15/2021 Medical Rec #:  [id number]     Height:       68.0 in Accession #:    [id number]    Weight:       374.3 lb Date of Birth:  1970/04/30     BSA:           2.668 m Patient Age:    25 years      BP:           127/82 mmHg Patient Gender: M             HR:           92 bpm. Exam Location:  Inpatient Procedure: 2D Echo, Cardiac Doppler, Color Doppler and Intracardiac            Opacification Agent Indications:    CHF  History:        Patient has prior history of  Echocardiogram examinations. COPD;                 Risk Factors:Hypertension, Diabetes, Sleep Apnea and Current                 Smoker.  Sonographer:    Jyl Heinz Referring Phys: Orma Flaming  Sonographer Comments: Patient is morbidly obese. Image acquisition challenging due to patient body habitus. Pt sitting up, on BiPAP IMPRESSIONS  1. Left ventricular ejection fraction, by estimation, is 55 to 60%. The left ventricle has normal function. The left ventricle has no regional wall motion abnormalities. Left ventricular diastolic parameters are consistent with Grade I diastolic dysfunction (impaired relaxation).  2. Right ventricular systolic function is normal. The right ventricular size is normal.  3. The mitral valve is normal in structure. No evidence of mitral valve regurgitation. No evidence of mitral stenosis.  4. The aortic valve is tricuspid. Aortic valve regurgitation is not visualized. No aortic stenosis is present.  5. The inferior vena cava is normal in size with greater than 50% respiratory variability, suggesting right atrial pressure of 3 mmHg. Comparison(s): No significant change from prior study. Prior images reviewed side by side. FINDINGS  Left Ventricle: Left ventricular ejection fraction, by estimation, is 55 to 60%. The left ventricle has normal function. The left ventricle has no regional wall motion abnormalities. Definity contrast agent was given IV to delineate the left ventricular  endocardial borders. The left ventricular internal cavity size was normal in size. There is no left ventricular hypertrophy. Left ventricular diastolic parameters are consistent with Grade I diastolic  dysfunction (impaired relaxation). Right Ventricle: The right ventricular size is normal. No increase in right ventricular wall thickness. Right ventricular systolic function is normal. Left Atrium: Left atrial size was normal in size. Right Atrium: Right atrial size was normal in size. Pericardium: There is no evidence of pericardial effusion. Mitral Valve: The mitral valve is normal in structure. No evidence of mitral valve regurgitation. No evidence of mitral valve stenosis. Tricuspid Valve: The tricuspid valve is normal in structure. Tricuspid valve regurgitation is trivial. No evidence of tricuspid stenosis. Aortic Valve: The aortic valve is tricuspid. Aortic valve regurgitation is not visualized. No aortic stenosis is present. Aortic valve peak gradient measures 7.3 mmHg. Pulmonic Valve: The pulmonic valve was normal in structure. Pulmonic valve regurgitation is not visualized. No evidence of pulmonic stenosis. Aorta: The aortic root is normal in size and structure. Venous: The inferior vena cava is normal in size with greater than 50% respiratory variability, suggesting right atrial pressure of 3 mmHg. IAS/Shunts: No atrial level shunt detected by color flow Doppler.  LEFT VENTRICLE PLAX 2D LVIDd:         5.30 cm      Diastology LVIDs:         4.00 cm      LV e' medial:    5.77 cm/s LV PW:         1.10 cm      LV E/e' medial:  12.5 LV IVS:        1.10 cm      LV e' lateral:   9.46 cm/s LVOT diam:     2.00 cm      LV E/e' lateral: 7.6 LV SV:         58 LV SV Index:   22 LVOT Area:     3.14 cm  LV Volumes (MOD) LV vol d, MOD A2C: 114.0 ml LV vol d, MOD A4C: 124.0  ml LV vol s, MOD A2C: 48.8 ml LV vol s, MOD A4C: 51.7 ml LV SV MOD A2C:     65.2 ml LV SV MOD A4C:     124.0 ml LV SV MOD BP:      67.5 ml RIGHT VENTRICLE             IVC RV Basal diam:  3.80 cm     IVC diam: 2.80 cm RV Mid diam:    3.90 cm RV S prime:     10.10 cm/s TAPSE (M-mode): 1.8 cm LEFT ATRIUM             Index        RIGHT ATRIUM            Index LA diam:        3.30 cm 1.24 cm/m   RA Area:     19.00 cm LA Vol (A2C):   53.7 ml 20.13 ml/m  RA Volume:   54.60 ml  20.47 ml/m LA Vol (A4C):   39.7 ml 14.88 ml/m LA Biplane Vol: 48.5 ml 18.18 ml/m  AORTIC VALVE AV Area (Vmax): 2.89 cm AV Vmax:        135.00 cm/s AV Peak Grad:   7.3 mmHg LVOT Vmax:      124.00 cm/s LVOT Vmean:     89.200 cm/s LVOT VTI:       0.185 m  AORTA Ao Root diam: 3.40 cm Ao Asc diam:  3.30 cm MITRAL VALVE MV Area (PHT): 3.95 cm    SHUNTS MV Decel Time: 192 msec    Systemic VTI:  0.18 m MV E velocity: 72.00 cm/s  Systemic Diam: 2.00 cm MV A velocity: 70.20 cm/s MV E/A ratio:  1.03 Candee Furbish MD Electronically signed by Candee Furbish MD Signature Date/Time: 07/15/2021/11:31:48 AM    Final    DG Chest Port 1V same Day  Result Date: 07/15/2021 CLINICAL DATA:  Shortness of breath, COPD, smoker EXAM: PORTABLE CHEST 1 VIEW COMPARISON:  Portable exam 0852 hours compared to 07/14/2021 FINDINGS: Enlargement of cardiac silhouette. Mediastinal contours normal. Low lung volumes with RIGHT basilar atelectasis and question small RIGHT pleural effusion. Upper lungs clear. No pneumothorax or acute osseous findings. IMPRESSION: Enlargement of cardiac silhouette. Low lung volumes with RIGHT basilar atelectasis and question small RIGHT pleural effusion. Electronically Signed   By: Lavonia Dana M.D.   On: 07/15/2021 09:08   DG Chest Portable 1 View  Result Date: 07/14/2021 CLINICAL DATA:  Shortness of breath, history COPD, smoker, hypertension diabetes EXAM: PORTABLE CHEST 1 VIEW COMPARISON:  01/13/2021 FINDINGS: Limited exam because of body habitus and portable technique. Patchy bibasilar opacities worse on the right, obscuring the right hemidiaphragm concerning for basilar pneumonia. Small effusion on the right not excluded. Heart is enlarged. No pneumothorax. Trachea midline. IMPRESSION: Bibasilar opacities, worse on the right with associated small right effusion concerning for basilar  pneumonia. Stable heart size and vascularity. Electronically Signed   By: Jerilynn Mages.  Shick M.D.   On: 07/14/2021 15:26     The results of significant diagnostics from this hospitalization (including imaging, microbiology, ancillary and laboratory) are listed below for reference.     Microbiology: No results found for this or any previous visit (from the past 240 hour(s)).   Labs: BNP (last 3 results) Recent Labs    01/13/21 1656 07/14/21 1550 07/26/21 2237  BNP 184.6* 132.1* 27.0   Basic Metabolic Panel: Recent Labs  Lab 07/26/21 2237 07/27/21 1033 07/28/21 0517 07/29/21  0529 07/30/21 0401  NA 136 140 139 142 138  K 4.2 4.2 3.9 3.9 3.8  CL 98 103 105 104 101  CO2 27 28 28 27 30   GLUCOSE 227* 202* 231* 164* 235*  BUN 19 13 10 15 14   CREATININE 1.44* 0.95 0.84 0.87 0.90  CALCIUM 8.5* 8.2* 8.4* 8.5* 8.4*  MG  --   --  2.2 2.1 2.3   Liver Function Tests: Recent Labs  Lab 07/27/21 1033  AST 16  ALT 22  ALKPHOS 60  BILITOT 0.6  PROT 5.7*  ALBUMIN 2.7*   No results for input(s): "LIPASE", "AMYLASE" in the last 168 hours. No results for input(s): "AMMONIA" in the last 168 hours. CBC: Recent Labs  Lab 07/26/21 2237 07/27/21 0829  WBC 15.0* 12.4*  NEUTROABS 11.9* 9.4*  HGB 15.2 16.1  HCT 50.0 50.8  MCV 86.1 84.9  PLT 209 202   Cardiac Enzymes: Recent Labs  Lab 07/26/21 2237  CKTOTAL 27*   BNP: Invalid input(s): "POCBNP" CBG: Recent Labs  Lab 07/29/21 0721 07/29/21 1133 07/29/21 1627 07/29/21 2047 07/30/21 0738  GLUCAP 162* 170* 335* 328* 180*   D-Dimer No results for input(s): "DDIMER" in the last 72 hours. Hgb A1c No results for input(s): "HGBA1C" in the last 72 hours. Lipid Profile No results for input(s): "CHOL", "HDL", "LDLCALC", "TRIG", "CHOLHDL", "LDLDIRECT" in the last 72 hours. Thyroid function studies Recent Labs    07/27/21 0829  TSH 1.888   Anemia work up No results for input(s): "VITAMINB12", "FOLATE", "FERRITIN", "TIBC", "IRON",  "RETICCTPCT" in the last 72 hours. Urinalysis    Component Value Date/Time   COLORURINE STRAW (A) 07/27/2021 0234   APPEARANCEUR CLEAR 07/27/2021 0234   LABSPEC 1.009 07/27/2021 0234   PHURINE 6.0 07/27/2021 0234   GLUCOSEU >=500 (A) 07/27/2021 0234   HGBUR NEGATIVE 07/27/2021 0234   BILIRUBINUR NEGATIVE 07/27/2021 0234   KETONESUR NEGATIVE 07/27/2021 0234   PROTEINUR NEGATIVE 07/27/2021 0234   NITRITE NEGATIVE 07/27/2021 0234   LEUKOCYTESUR NEGATIVE 07/27/2021 0234   Sepsis Labs Recent Labs  Lab 07/26/21 2237 07/27/21 0829  WBC 15.0* 12.4*   Microbiology No results found for this or any previous visit (from the past 240 hour(s)).   Time coordinating discharge:  I have spent 35 minutes face to face with the patient and on the ward discussing the patients care, assessment, plan and disposition with other care givers. >50% of the time was devoted counseling the patient about the risks and benefits of treatment/Discharge disposition and coordinating care.   SIGNED:   Damita Lack, MD  [facility] 07/30/2021, 8:15 AM   If 7PM-7AM, please contact night-coverage

## 2021-07-30 NOTE — Progress Notes (Signed)
DISCHARGE NOTE HOME Raymond Hood to be discharged Home per MD order. Discussed prescriptions and follow up appointments with the patient. Prescriptions given to patient; medication list explained in detail. Patient verbalized understanding.  Skin clean, dry and intact without evidence of skin break down, no evidence of skin tears noted. IV catheter discontinued intact. Site without signs and symptoms of complications. Dressing and pressure applied. Pt denies pain at the site currently. No complaints noted.  Patient free of lines, drains, and wounds.   An After Visit Summary (AVS) was printed and given to the patient. Patient escorted via wheelchair, and discharged home via taxi.  Malorie Bigford S Pamalee Marcoe, RN

## 2021-08-04 ENCOUNTER — Encounter: Payer: Self-pay | Admitting: Physician Assistant

## 2021-08-04 ENCOUNTER — Other Ambulatory Visit: Payer: Self-pay | Admitting: *Deleted

## 2021-08-04 ENCOUNTER — Other Ambulatory Visit (HOSPITAL_COMMUNITY): Payer: Self-pay

## 2021-08-04 MED ORDER — GABAPENTIN 300 MG PO CAPS
ORAL_CAPSULE | ORAL | 0 refills | Status: DC
  Filled 2021-08-04: qty 150, 30d supply, fill #0

## 2021-08-04 MED ORDER — ATORVASTATIN CALCIUM 10 MG PO TABS
10.0000 mg | ORAL_TABLET | Freq: Every day | ORAL | 3 refills | Status: DC
  Filled 2021-08-04: qty 30, 30d supply, fill #0

## 2021-08-04 MED ORDER — PRESERVISION AREDS 2 PO CAPS
1.0000 | ORAL_CAPSULE | Freq: Two times a day (BID) | ORAL | 6 refills | Status: DC
  Filled 2021-08-04 (×2): qty 120, 60d supply, fill #0

## 2021-08-04 MED ORDER — FAMOTIDINE 20 MG PO TABS
20.0000 mg | ORAL_TABLET | Freq: Two times a day (BID) | ORAL | 0 refills | Status: DC
  Filled 2021-08-04: qty 60, 30d supply, fill #0

## 2021-08-04 MED ORDER — RISPERIDONE 4 MG PO TABS
4.0000 mg | ORAL_TABLET | Freq: Every day | ORAL | 0 refills | Status: DC
  Filled 2021-08-04: qty 30, 30d supply, fill #0

## 2021-08-04 MED ORDER — METFORMIN HCL 500 MG PO TABS
1000.0000 mg | ORAL_TABLET | Freq: Two times a day (BID) | ORAL | 0 refills | Status: DC
  Filled 2021-08-04: qty 120, 30d supply, fill #0

## 2021-08-04 MED ORDER — TAMSULOSIN HCL 0.4 MG PO CAPS
0.4000 mg | ORAL_CAPSULE | Freq: Every day | ORAL | 3 refills | Status: DC
  Filled 2021-08-04: qty 30, 30d supply, fill #0

## 2021-08-04 MED ORDER — HYDROXYZINE HCL 25 MG PO TABS
25.0000 mg | ORAL_TABLET | Freq: Three times a day (TID) | ORAL | 2 refills | Status: DC | PRN
  Filled 2021-08-04: qty 90, 30d supply, fill #0

## 2021-08-04 MED ORDER — INSULIN ASPART PROT & ASPART (70-30 MIX) 100 UNIT/ML PEN
30.0000 [IU] | PEN_INJECTOR | Freq: Two times a day (BID) | SUBCUTANEOUS | 1 refills | Status: DC
  Filled 2021-08-04: qty 15, 25d supply, fill #0

## 2021-08-04 MED ORDER — FUROSEMIDE 40 MG PO TABS
40.0000 mg | ORAL_TABLET | Freq: Two times a day (BID) | ORAL | 0 refills | Status: DC
  Filled 2021-08-04: qty 30, 15d supply, fill #0

## 2021-08-04 MED ORDER — LINAGLIPTIN 5 MG PO TABS
5.0000 mg | ORAL_TABLET | Freq: Every day | ORAL | 0 refills | Status: DC
  Filled 2021-08-04: qty 30, 30d supply, fill #0

## 2021-08-04 NOTE — Progress Notes (Cosign Needed)
Pt seen by Dr Delford Field.   Med box had not been filled, so he was not taking meds since released from the hospital 07/21  Med box has been reloaded. Some things are out, [name] will pick them up tomorrow.   Today's Vitals   08/04/21 1515  BP: (!) 178/106  Pulse: 80  Resp: 20  SpO2: 97%  Weight: (!) 372 lb 9.6 oz (169 kg)   Body mass index is 56.65 kg/m.  CBG 212  Wt Readings from Last 3 Encounters:  08/04/21 (!) 372 lb 9.6 oz (169 kg)  07/26/21 (!) 385 lb 5.8 oz (174.8 kg)  07/26/21 (!) 358 lb 11 oz (162.7 kg)   He was to be on Prednisone 50 mg x 2 days, but still has a tablet left. He was given the prednisone in the office as well as all his evening meds (since BP was very high).   Lasix is now 40 mg bid.  He is not able to get out of the heat during the day, cannot use to bus to get to [employer].   Another resident, Mr Renae Fickle, says he will help him get to the library to keep cool during the day.   He needs to see the foot doctor again >> Dr Delford Field and [name] to arrange.  He needs a f/u appt in [facility] with labs >> [facility] will arrange.   [name], PA-C 08/04/2021 3:32 PM'   I agree with the above note

## 2021-08-05 ENCOUNTER — Other Ambulatory Visit: Payer: Self-pay | Admitting: Critical Care Medicine

## 2021-08-05 ENCOUNTER — Other Ambulatory Visit: Payer: Self-pay

## 2021-08-05 ENCOUNTER — Encounter: Payer: Self-pay | Admitting: *Deleted

## 2021-08-05 DIAGNOSIS — B351 Tinea unguium: Secondary | ICD-10-CM

## 2021-08-05 NOTE — [facility]reg[facility] Nurse Program (Signed)
08/04/2021 Pt will be seen for hosp f/u. States he has been back from hosp for 3 days and has not been taking his meds except for insulin is not checking CBG. Pill box organized today and refills ordered. Dr Delford Field will make an appt for foot care. BP elevated, CBG 212 eating chips and snack food this afternoon.  Will see dr Delford Field for office HF/U 7/31. Encouraged to ride bus to [employer] and drink more water

## 2021-08-08 NOTE — Progress Notes (Signed)
Patient  Transition of care patient Office Visit  Subjective:  Patient ID: Raymond Hood, male    DOB: 05/01/1970  Age: 51 y.o. MRN: [id number]  CC:  Chief Complaint  Patient presents with   Hospitalization Follow-up   Medication Refill    HPI 01/25/21 Transition of care visit Raymond Hood presents for follow-up after hospitalization for COPD exacerbation. He was discharged on 01/19/21. Since then, he had a transition of care follow-up call with Raymond Lathe, RN on 01/20/21, and was seen at the shelter by Dr. Joya Hood on 01/20/21.   He reports that he is feeling better since then, but that he still has some trouble taking deep breaths, dyspnea on exertion, and some coughing. He denies any rhinorrhea or congestion. He reports some feelings of lightheadedness upon standing or sitting quickly after activity. His initial SpO2 reading was at 84% after walking from waiting room to exam room. After rest, it measured 96%.   The patient states that he is using all the medications he was discharged with as directed, including the oral steroid taper. He says he has not been able to use the BiPAP machine that he was given, because he does not have a bedside table and there is not room for it in his bed at the shelter. He still admits to smoking cigarettes daily.   The patient says he has been experiencing symptoms of increased urinary frequency. He denies dysuria and hematuria. He states that his sleep has been greatly disturbed because he awakes up to 5 times at night to urinate, just as he feels like he is getting good sleep. He is very fatigued as a result of this. He tries to take naps when he can, but this is not always possible at the shelter due to rules in place there. He reports that he has been experiencing greater hunger and thirst recently. Of note, a POC blood sugar was done today, that measured 199 mg/dL. His A1C at today's visit was 7.7%.  The patient also admits to foot pain, mainly in his  ankles. He experiences some feelings of numbness in his feet, especially on the soles. He states that he has had the pain and numbness for a long time, and that it does not seem to be getting better or worse.  The patient states that his mood remains depressed and his anxiety high. He experiences thoughts of hurting himself, but he has no plans to do so. He has not had a chance yet to follow up with mental health.   The following is the discharge summary from the patient's hospital course on 01/13/2021:  Admit date:     01/13/2021  Discharge date: 01/19/21  Discharge Physician: [name]    PCP: [name], MD    Recommendations at discharge:    Follow-up COPD Follow-up schizoaffective disorder Diastolic CHF, see below   Discharge Diagnoses Active Problems:   COPD with acute exacerbation ([place])   Schizo affective schizophrenia ([place])   Homeless   Type 2 diabetes mellitus with hyperglycemia (HCC)   OSA (obstructive sleep apnea)   Acute on chronic diastolic CHF (congestive heart failure) ([place])   Hypertension associated with diabetes ([place])   Morbid obesity with BMI of 50.0-59.9, adult (HCC)   Obesity hypoventilation syndrome (HCC)   Cigarette smoker   Principal Problem (Resolved):   Acute respiratory failure with hypoxia and hypercapnia [facility])     Hospital Course   51 year old male PMH COPD, OSA, on BiPAP, noncompliant secondary to homeless  status, schizoaffective disorder bipolar type, presented with shortness of breath and hypoxia.  Admitted for acute hypoxic respiratory failure secondary to COPD exacerbation complicated by OSA and probable OHS.  Slowly improving with standard therapy, hypoxia resolved, stable for discharge.     * Acute respiratory failure with hypoxia and hypercapnia (HCC)-resolved as of 01/18/2021, (present on admission) -- Resolved.  COPD exacerbation resolved.   COPD with acute exacerbation ([place])- (present on admission) -- Resolved with standard  treatment.  Discharged on oral steroids. Continue BiPAP at night as able although the patient reports that he has not been able to use it at [facility] for many months now.   Acute on chronic diastolic CHF (congestive heart failure) ([place]) -- Echocardiogram October 2020 again showed normal LVEF, grade 1 diastolic dysfunction.  Significant volume overload now appears resolved.  Outpatient follow-up with PCP.  Consider diuretic in future if needed.   OSA (obstructive sleep apnea)- (present on admission) -- BiPAP nightly ideally   Type 2 diabetes mellitus with hyperglycemia ([place]) -- Hyperglycemic complicated by steroids, overall stable.  Apparently only on metformin as an outpatient.  Expect blood sugars to continue to improve as steroids are decreased. -- Resume metformin on discharge   Homeless -- Appreciate TOC, likely to return to [facility] where he has been living for several months despite the inability to use his BiPAP nightly.      Schizo affective schizophrenia ([place])- (present on admission) -- Patient not yet established with psychiatry in the area.  Continue Resporal and duloxetine as recommended by inpatient psychiatry consultation.  Outpatient establishment with psychiatric care recommended.   Obesity hypoventilation syndrome (HCC) -- Suspected diagnosis based on body habitus.  Weight loss as an outpatient recommended.   Hypertension associated with diabetes ([place])- (present on admission) -- Appears stable, continue amlodipine.   Cigarette smoker- (present on admission) -- Recommend cessation    03/24/21 Patient returns in follow-up complains of some right eye irritation that persist and increased edema in the lower extremity and also increased low back pain.  Patient with hypertension diabetes COPD severe sleep apnea.  He still smoking half pack a day of cigarettes.  On arrival blood pressure is good 110/76 blood sugar 208.  The patient was seen in the shelter clinic  yesterday and his amlodipine was stopped because of lower extremity edema which is worsened.  He was placed on Lasix twice to 3 times weekly and this prescription is being filled at this time.  He is on some topical eyedrops without much improvement. Patient continues to score high on his suicide screen however does not have specific plans to end his life he is on the rrisperdal  06/01/2021 The patient was seen in return follow-up Patient's blood pressure 116/76 blood sugar 194 weight is down to 382.  Patient notes decreased cough is wearing CPAP has less shortness of breath does have right eye irritation.  He has very poor dentition is in need of dental care. Patient is still living in the homeless shelter.  Patient has chronic suicidal ideation but no specific plans.  7/31 this is a 7-day transition of care post hospital visit This patient seen in post hospital follow-up.  He was hospitalized yet again for the second time in July.  Above is documentation from both hospitalizations with discharge summaries. After the first hospitalization he was only in the shelter a few days when he had to return for a readmission as noted with the second discharge summary.  We did see  him last week after his second admission and it was after the first admission that we saw him and we had to send him back to the hospital because of hypoxemia and weakness  The patient has diuresed well since his second discharge his breathing is better oxygen saturation is 95% today on room air.  He is wheezing and coughing less.  He is still smoking a pack a day of cigarettes and has no interest in quitting.  He needs a refill on his albuterol inhaler.  His edema is less.  Weight is down to 369 pounds which is the lowest its been.  On arrival blood pressure 109/80 but blood sugar is 182.  He now is on 70/30 insulin 30 units twice daily to improve compliance.  He maintains his blood pressure medications as before.  He is off all  antibiotics and prednisone.  He is using a pill organizer for his medications and we did a complete and total medication reconciliation last week at the shelter on Wednesday.  Note he was discharged on Monday and did not take any of his oral medications for 2 days and was quite edematous.  Today he is in markedly improved condition after we got his medications restarted by way of the pill organizer.  The patient is trying to stay out of the heat even though he has to go outside some during the day because they are working on the lobby HVAC system of the shelter he is staying in.  The patient still working on disability no word yet on this.  Patient needs follow-up labs.  Below are the discharge summary documentations.  Admit date: 07/14/2021 Discharge date: 07/26/2021 Recommendations for Outpatient Follow-up:  Follow up with PCP in 1 weeks-call for appointment Please obtain BMP/CBC in one week   Discharge Dispo: homeless sshelter Discharge Condition: Stable Code Status:   Code Status: Full Code Diet recommendation:  Diet Order                  Diet heart healthy/carb modified Room service appropriate? Yes; Fluid consistency: Thin; Fluid restriction: 1200 mL Fluid  Diet effective now                     Brief/Interim Summary: 51 year old male with multiple medical problems including COPD/OSA/OHS, HF PEF, HTN, T2DM, schizoaffective/BPD/homelessness, ongoing tobacco use presented with several days history of worsening shortness of breath (seen by PCP recently-started on steroids/cefdinir/Lasix dose escalated)-found to have acute hypoxic respiratory failure due to PNA and decompensated heart failure.  Patient was admitted and treated for pneumonia, CHF exacerbation, hypoxia, hypercapnia initially needing BiPAP> then to HFNC.  Blood culture 7/5/6 no growth, respiratory virus panel COVID-19 negative, bilateral lower extremity no DVT on 7/11 echo with EF 55 to 60%, G1 DD.  Patient managed  antibiotic steroids diuretics.  This time respiratory status improved he is now on room air. He is doing overall well he is medically stable for discharge back to homeless/shelter as discussed with his PCP last week.   Discharge Diagnoses:  Principal Problem:   Acute on chronic respiratory failure with hypoxia (HCC) Active Problems:   Sepsis from pneumonia    Acute on chronic diastolic CHF (congestive heart failure) (HCC)   COPD with chronic bronchitis (HCC)   Conjunctivitis   Prolonged QT interval   Type 2 diabetes mellitus with hyperglycemia (HCC)   Essential hypertension   GERD (gastroesophageal reflux disease)   Schizo affective schizophrenia (HCC)   Anxiety and depression  OSA/OHS   Tobacco abuse   Homeless   CAP (community acquired pneumonia)   Acute on chronic respiratory failure with hypoxia  CAP Sepsis from pneumonia  Acute COPD exacerbation with chronic bronchitis: Shortness of breath/hypoxic hypercapnic respiratory failure multifactorial due to sepsis due to pneumonia, COPD and CHF exacerbation.Able to wean off oxygen,completed antibiotics/steroid. Continue with scheduled albuterol every 6 hours along with other home bronchodilators,incentive spirometry flutter valve, antitussives.   Acute on chronic diastolic CHF: Patient has been diuresed with IV Lasix transitioned to p.o., monitor electrolytes volume status monitor closely along with intake output.  Continue fluid restriction diet, focusing on education as he is noncompliant. cont [place] [employer] Since Admission: -24,295 mL [07/26/21 1401]  OSA/OHS:Uses home cpap- cont same there   Conjunctivitis bilateral, treated with erythromycin ointment.  Improved   T2DM with uncontrolled hyperglycemia: Currently well controlled  on metformin/Tradjenta, Premeal insulin 12 units TID and Semglee 35 u. Insulin education done and he is comfortable doing inj and knew abt signs and symptoms of hypoglycemia and hyperglycemia.   Insulin pne is provided  for him to return to [place]  Lab 07/25/21 1242 07/25/21 1649 07/25/21 2053 07/26/21 0550 07/26/21 1148  GLUCAP 164* 157* 121* 163* 137*      Essential hypertension: Well-controlled on Lasix HLD on statin GERD: Continue Pepcid   Homeless Schizo affective schizophrenia Anxiety and depression: Mood stable continue Risperdal, Neurontin Cymbalta   Tobacco abuse-cessation advised continue nicotine patch Debility deconditioning encourage ambulation PT OT   Morbid Obesity:Patient's Body mass index is 53.7 kg/m. : Will benefit with PCP follow-up, weight loss  healthy lifestyle and cont home NIV   I communicated with Dr. Saralyn Pilar Mattheo Swindle's PCP to follow-up on our recommendation and monitor his sugar at facility.   Admit date: 07/26/2021 Discharge date: 07/30/2021   Admitted From: [facility] Dis[facility]   Recommendations for Outpatient Follow-up:  Follow up with PCP in 1-2 weeks Please obtain BMP/CBC in one week your next doctors visit.  Change Lantus and Premeal Novolog to 70/30 Insulin 30U BID. Continue home PO medications  2 more days of Oral Prednisone Bowel Regimen prn prescribed Continue home cardiac regimen. Follow up outpatient with Cardiology in 3-4 weeks.      Discharge Condition: Stable CODE STATUS: Full  Diet recommendation: Heart Healthy/Diabetic.    Brief/Interim Summary: 51 year old with history of COPD, OSA, diastolic CHF, HTN, DM2, schizoaffective disorder, anxiety, nicotine dependence came to the hospital with shortness of breath and dizziness.  Patient was admitted at [facility] from 7/5 - 7/17 for acute hypoxia of from pneumonia, COPD and CHF exacerbation which was treated with bronchodilators, steroids and diuretics.  About 25 L of fluid was taken off.  After being discharged patient went to [facility] where he was outside in the heat for several hours thereafter came to the hospital.   Upon admission noted to have acute kidney injury with creatinine 1.44, baseline 0.7.  Slowly with IV fluids his renal function returned back to baseline, he was seen by cardiology and pulmonary.  He no longer needed IV fluids and his renal function stabilized.  He was eventually resumed on his outpatient Lasix.  PT recommended no further follow-up necessary therefore he was cleared for discharge.     Assessment & Plan:  Principal Problem:   AKI (acute kidney injury) ([place]) Active Problems:   COPD with acute exacerbation (HCC)   Chronic diastolic heart  failure (HCC)   Type 2 diabetes mellitus with hyperglycemia, with long-term current use of insulin (HCC)   GERD (gastroesophageal reflux disease)   Schizo affective schizophrenia ([place])   OSA/OHS       Assessment and Plan: * AKI (acute kidney injury) ([place]) - Volume depletion, creatinine peaked at 1.44, approaching baseline of 0.8.   COPD with acute exacerbation (HCC) -Bronchodilators, prednisone- 2 more days Seen by Pulm.    Chronic diastolic heart failure (HCC) -BNP is normal  Cardiology consulted. Resume home oral diuretics upon dc.    Type 2 diabetes mellitus with hyperglycemia, with long-term current use of insulin (HCC) Peripheral neuropathy -A1c 8.9.  Continue Farxiga and metformin.  He is having trouble taking basal bolus dosing therefore will transition to 70/30 30 units twice daily. Seen by diabetic coordinator -Gabapentin   GERD (gastroesophageal reflux disease) -Pepcid   Schizo affective schizophrenia ([place]) -On home meds-Cymbalta, risperidone   OSA/OHS -Supplemental oxygen                   Discharge Diagnoses:  Principal Problem:   AKI (acute kidney injury) ([place]) Active Problems:   COPD with acute exacerbation (HCC)   Chronic diastolic heart failure ([place])   Type 2 diabetes mellitus with hyperglycemia, with long-term current use of insulin (HCC)   GERD (gastroesophageal reflux disease)   Schizo affective  schizophrenia ([place])   OSA/OHS           Consultations: [facility].    Subjective: No complaints, doing ok.    Discharge Exam:     Vitals:    07/30/21 0012 07/30/21 0433  BP:   118/78  Pulse: 96 79  Resp: 18 18  Temp:   98.4 F (36.9 C)  SpO2: 96% 97%          Vitals:    07/29/21 1653 07/29/21 2045 07/30/21 0012 07/30/21 0433  BP: 128/79 123/79   118/78  Pulse: 96 94 96 79  Resp: 17 18 18 18   Temp: 98.2 F (36.8 C) 97.9 F (36.6 C)   98.4 F (36.9 C)  TempSrc:   Oral   Oral  SpO2: 96% 97% 96% 97%     Past Medical History:  Diagnosis Date   Bipolar 1 disorder, depressed (HCC)    COPD (chronic obstructive pulmonary disease) ([place])    Depression with anxiety    Diabetes mellitus type II, non insulin dependent ([place]) 11/2020   A1c 7.3   Fall at home, initial encounter 05/28/2020   Last Assessment & Plan:  Formatting of this note might be different from the original. Acute onset, occurred few days ago.  Reports having a mechanical fall, fell forward injuring his left side including arm, knee.  Denies any vision changes, head trauma or loss of consciousness.  Patient is being referred to the emergency room for lower GI bleed, can also get evaluated for his injuries.   Gastric ulcer    when on NSAIDs   GERD (gastroesophageal reflux disease)    Helicobacter pylori antibody positive 07/04/2018   Formatting of this note might be different from the original. Started triple therapy 06/30/18.   Needs HP stool antigen in 8 weeks,  Off PPI for 10 days.   HTN (hypertension)    OSA (obstructive sleep apnea)    not currently on CPAP   Rheumatoid arthritis ([place])    Right kidney mass, 2.8 cm 11/2020   Tobacco abuse     Past Surgical History:  Procedure Laterality Date  left index finger surgery Left     Family History  Problem Relation Age of Onset   Schizophrenia Mother    Diabetes Mellitus II Father     Social History   Socioeconomic History   Marital status:  Single    Spouse name: Not on file   Number of children: Not on file   Years of education: Not on file   Highest education level: Not on file  Occupational History   Not on file  Tobacco Use   Smoking status: Every Day    Packs/day: 1.00    Types: Cigarettes   Smokeless tobacco: Never  Vaping Use   Vaping Use: Never used  Substance and Sexual Activity   Alcohol use: Not Currently   Drug use: Never   Sexual activity: Not on file  Other Topics Concern   Not on file  Social History Narrative   Not on file   Social Determinants of Health   Financial Resource Strain: Not on file  Food Insecurity: Not on file  Transportation Needs: Not on file  Physical Activity: Not on file  Stress: Not on file  Social Connections: Not on file  Intimate Partner Violence: Not on file    Outpatient Medications Prior to Visit  Medication Sig Dispense Refill   atorvastatin (LIPITOR) 10 MG tablet Take 1 tablet (10 mg total) by mouth once daily. 30 tablet 3   Blood Glucose Monitoring Suppl (ACCU-CHEK GUIDE) w/Device KIT Use up to four times daily as directed. 1 kit 0   budesonide-formoterol (SYMBICORT) 160-4.5 MCG/ACT inhaler Inhale 2 puffs into the lungs 2 (two) times daily. 10.2 g 12   ciprofloxacin (CILOXAN) 0.3 % ophthalmic solution Place 1 drop into the right eye every 4 (four) hours while awake. 5 mL 0   dapagliflozin propanediol (FARXIGA) 10 MG TABS tablet Take 1 tablet (10 mg total) by mouth daily. 30 tablet 3   DULoxetine (CYMBALTA) 60 MG capsule Take 1 capsule (60 mg total) by mouth daily. 30 capsule 3   famotidine (PEPCID) 20 MG tablet Take 1 tablet (20 mg total) by mouth 2 (two) times daily. 60 tablet 0   Fingerstix Lancets MISC Use as directed up to 4 times daily. 100 each 0   fluticasone (FLONASE) 50 MCG/ACT nasal spray Place 2 sprays into both nostrils daily. 16 g 6   gabapentin (NEURONTIN) 300 MG capsule Take 2 capsules in the morning AND 1 capsule midday AND 2 capsules every  evening. 150 capsule 0   glucose blood test strip Use four times daily as directed. 100 each 0   hydrOXYzine (ATARAX) 25 MG tablet Take 1 tablet (25 mg total) by mouth 3 (three) times daily as needed. 90 tablet 2   insulin aspart protamine - aspart (NOVOLOG 70/30 MIX) (70-30) 100 UNIT/ML FlexPen Inject 30 Units into the skin 2 (two) times daily with a meal. 15 mL 1   Insulin Pen Needle 32G X 4 MM MISC Use with insulin pens. 100 each 0   linagliptin (TRADJENTA) 5 MG TABS tablet Take 1 tablet (5 mg total) by mouth daily. 30 tablet 0   metFORMIN (GLUCOPHAGE) 500 MG tablet Take 2 tablets (1,000 mg total) by mouth 2 (two) times daily with a meal. 120 tablet 0   Multiple Vitamins-Minerals (PRESERVISION AREDS 2) CAPS Take 1 capsule by mouth 2 (two) times daily with food. Take 1 in the morning and 1 in the evening. 120 capsule 6   olopatadine (PATANOL) 0.1 % ophthalmic solution Place  1 drop into the right eye 2 (two) times daily. 5 mL 12   polyethylene glycol powder (GLYCOLAX/MIRALAX) 17 GM/SCOOP powder Take 17 g by mouth daily as needed for moderate constipation or severe constipation. 238 g 0   risperidone (RISPERDAL) 4 MG tablet Take 1 tablet (4 mg total) by mouth once daily. 30 tablet 0   tamsulosin (FLOMAX) 0.4 MG CAPS capsule Take 1 capsule (0.4 mg total) by mouth daily. 30 capsule 3   albuterol (VENTOLIN HFA) 108 (90 Base) MCG/ACT inhaler Inhale 2 puffs into the lungs every 6  hours as needed for wheezing or shortness of breath. 8.5 g 2   furosemide (LASIX) 40 MG tablet Take 1 tablet (40 mg total) by mouth 2 (two) times daily. 30 tablet 0   No facility-administered medications prior to visit.    No Known Allergies  ROS Review of Systems  Constitutional: Negative.  Negative for fever.  HENT: Negative.  Negative for congestion, ear pain, hearing loss, postnasal drip, rhinorrhea, sinus pressure, sore throat, trouble swallowing and voice change.   Eyes:  Negative for discharge and visual  disturbance.  Respiratory:  Negative for apnea, cough, choking, chest tightness, shortness of breath, wheezing and stridor.   Cardiovascular:  Positive for leg swelling. Negative for chest pain and palpitations.  Gastrointestinal: Negative.  Negative for abdominal distention, abdominal pain, diarrhea, nausea and vomiting.  Endocrine: Negative for polydipsia, polyphagia and polyuria.  Genitourinary:  Positive for frequency. Negative for dysuria, hematuria, scrotal swelling and urgency.  Musculoskeletal:  Positive for back pain and myalgias. Negative for arthralgias.  Skin: Negative.  Negative for rash.  Allergic/Immunologic: Negative.  Negative for environmental allergies and food allergies.  Neurological: Negative.  Negative for dizziness, syncope, weakness, light-headedness (with standing or sitting down quickly) and headaches.  Hematological: Negative.  Negative for adenopathy. Does not bruise/bleed easily.  Psychiatric/Behavioral:  Positive for dysphoric mood, sleep disturbance and suicidal ideas. Negative for agitation. The patient is nervous/anxious.       Objective:    Physical Exam Vitals reviewed.  Constitutional:      Appearance: Normal appearance. He is well-developed. He is obese. He is not diaphoretic.  HENT:     Head: Normocephalic and atraumatic.     Nose: Nose normal. No nasal deformity, septal deviation, mucosal edema or rhinorrhea.     Right Sinus: No maxillary sinus tenderness or frontal sinus tenderness.     Left Sinus: No maxillary sinus tenderness or frontal sinus tenderness.     Mouth/Throat:     Mouth: Mucous membranes are moist.     Pharynx: Oropharynx is clear. No oropharyngeal exudate.  Eyes:     General: Lids are normal. Lids are everted, no foreign bodies appreciated. No scleral icterus.       Right eye: No discharge.        Left eye: No discharge.     Conjunctiva/sclera:     Right eye: Right conjunctiva is injected.     Pupils: Pupils are equal, round,  and reactive to light.     Comments: Conjunctivitis right eye resolved  Neck:     Thyroid: No thyromegaly.     Vascular: No carotid bruit or JVD.     Trachea: Trachea normal. No tracheal tenderness or tracheal deviation.  Cardiovascular:     Rate and Rhythm: Normal rate and regular rhythm.     Chest Wall: PMI is not displaced.     Pulses: Normal pulses. No decreased pulses.     Heart sounds:  S1 normal and S2 normal. Heart sounds are distant. No murmur heard.    No systolic murmur is present.     No diastolic murmur is present.     No friction rub. No gallop. No S3 or S4 sounds.  Pulmonary:     Effort: Pulmonary effort is normal. No tachypnea, accessory muscle usage or respiratory distress.     Breath sounds: Normal breath sounds. No stridor. No decreased breath sounds, wheezing, rhonchi or rales.     Comments: Markedly improved breath sounds no wheezes or rales or rhonchi Chest:     Chest wall: No tenderness.  Abdominal:     General: Bowel sounds are normal. There is no distension.     Palpations: Abdomen is soft. Abdomen is not rigid.     Tenderness: There is no abdominal tenderness. There is no guarding or rebound.  Musculoskeletal:        General: Normal range of motion.     Cervical back: Normal range of motion and neck supple. No edema, erythema or rigidity. No muscular tenderness. Normal range of motion.     Right lower leg: Edema present.     Left lower leg: Edema present.     Comments: Edema in both lower extremities markedly improved  Lymphadenopathy:     Head:     Right side of head: No submental or submandibular adenopathy.     Left side of head: No submental or submandibular adenopathy.     Cervical: No cervical adenopathy.  Skin:    General: Skin is warm and dry.     Coloration: Skin is not pale.     Findings: No rash.     Nails: There is no clubbing.  Neurological:     Mental Status: He is alert and oriented to person, place, and time.     Sensory: No sensory  deficit.  Psychiatric:        Attention and Perception: Attention normal. He does not perceive auditory hallucinations.        Mood and Affect: Mood is depressed.        Speech: Speech normal. Speech is not delayed.        Behavior: Behavior normal. Behavior is not slowed or withdrawn. Behavior is cooperative.        Thought Content: Thought content is not paranoid or delusional. Thought content includes suicidal ideation. Thought content does not include homicidal ideation. Thought content does not include homicidal or suicidal plan.        Cognition and Memory: Cognition is impaired. He exhibits impaired recent memory.        Judgment: Judgment is not impulsive or inappropriate.     BP 109/80   Pulse (!) 108   Ht 5' 8"  (1.727 m)   Wt (!) 369 lb (167.4 kg)   SpO2 95%   BMI 56.11 kg/m  Wt Readings from Last 3 Encounters:  08/09/21 (!) 369 lb (167.4 kg)  08/04/21 (!) 372 lb 9.6 oz (169 kg)  07/26/21 (!) 385 lb 5.8 oz (174.8 kg)     Health Maintenance Due  Topic Date Due   COVID-19 Vaccine (3 - Moderna series) 09/10/2019    There are no preventive care reminders to display for this patient.  Lab Results  Component Value Date   TSH 1.888 07/27/2021   Lab Results  Component Value Date   WBC 12.4 (H) 07/27/2021   HGB 16.1 07/27/2021   HCT 50.8 07/27/2021   MCV 84.9 07/27/2021  PLT 202 07/27/2021   Lab Results  Component Value Date   NA 138 07/30/2021   K 3.8 07/30/2021   CO2 30 07/30/2021   GLUCOSE 235 (H) 07/30/2021   BUN 14 07/30/2021   CREATININE 0.90 07/30/2021   BILITOT 0.6 07/27/2021   ALKPHOS 60 07/27/2021   AST 16 07/27/2021   ALT 22 07/27/2021   PROT 5.7 (L) 07/27/2021   ALBUMIN 2.7 (L) 07/27/2021   CALCIUM 8.4 (L) 07/30/2021   ANIONGAP 7 07/30/2021   EGFR 108 03/25/2021   Lab Results  Component Value Date   CHOL 186 01/25/2021   Lab Results  Component Value Date   HDL 42 01/25/2021   Lab Results  Component Value Date   LDLCALC 99  01/25/2021   Lab Results  Component Value Date   TRIG 268 (H) 01/25/2021   Lab Results  Component Value Date   CHOLHDL 4.4 01/25/2021   Lab Results  Component Value Date   HGBA1C 8.9 (H) 07/27/2021      Assessment & Plan:   Problem List Items Addressed This Visit       Cardiovascular and Mediastinum   Hypertension associated with diabetes ([place])   Relevant Medications   furosemide (LASIX) 40 MG tablet   Other Relevant Orders   Comprehensive metabolic panel   CBC with Differential/Platelet     Genitourinary   AKI (acute kidney injury) ([place])   Relevant Orders   Comprehensive metabolic panel   Other Visit Diagnoses     Type 2 diabetes mellitus with hyperglycemia, without long-term current use of insulin (HCC)    -  Primary   Relevant Orders   POCT glucose (manual entry) (Completed)   Comprehensive metabolic panel      Meds ordered this encounter  Medications   albuterol (VENTOLIN HFA) 108 (90 Base) MCG/ACT inhaler    Sig: Inhale 2 puffs into the lungs every 6  hours as needed for wheezing or shortness of breath.    Dispense:  8.5 g    Refill:  2    Cong nurse fund  dr Raymond Hood to pick up   furosemide (LASIX) 40 MG tablet    Sig: One tablet daily    Dispense:  30 tablet    Refill:  0  38 minutes spent complex decision making  Follow-up: Follow up with Dr. Joya Hood in 2 months.    Asencion Noble, MD

## 2021-08-09 ENCOUNTER — Encounter: Payer: Self-pay | Admitting: Critical Care Medicine

## 2021-08-09 ENCOUNTER — Other Ambulatory Visit (HOSPITAL_COMMUNITY): Payer: Self-pay

## 2021-08-09 ENCOUNTER — Ambulatory Visit: Payer: MEDICAID | Attending: Critical Care Medicine | Admitting: Critical Care Medicine

## 2021-08-09 VITALS — BP 109/80 | HR 108 | Ht 68.0 in | Wt 369.0 lb

## 2021-08-09 DIAGNOSIS — I152 Hypertension secondary to endocrine disorders: Secondary | ICD-10-CM

## 2021-08-09 DIAGNOSIS — F419 Anxiety disorder, unspecified: Secondary | ICD-10-CM

## 2021-08-09 DIAGNOSIS — G4733 Obstructive sleep apnea (adult) (pediatric): Secondary | ICD-10-CM

## 2021-08-09 DIAGNOSIS — Z5901 Sheltered homelessness: Secondary | ICD-10-CM

## 2021-08-09 DIAGNOSIS — N179 Acute kidney failure, unspecified: Secondary | ICD-10-CM | POA: Diagnosis not present

## 2021-08-09 DIAGNOSIS — Z794 Long term (current) use of insulin: Secondary | ICD-10-CM | POA: Diagnosis not present

## 2021-08-09 DIAGNOSIS — J449 Chronic obstructive pulmonary disease, unspecified: Secondary | ICD-10-CM

## 2021-08-09 DIAGNOSIS — I5032 Chronic diastolic (congestive) heart failure: Secondary | ICD-10-CM

## 2021-08-09 DIAGNOSIS — Z5900 Homelessness unspecified: Secondary | ICD-10-CM

## 2021-08-09 DIAGNOSIS — F32A Depression, unspecified: Secondary | ICD-10-CM

## 2021-08-09 DIAGNOSIS — E1165 Type 2 diabetes mellitus with hyperglycemia: Secondary | ICD-10-CM | POA: Diagnosis not present

## 2021-08-09 DIAGNOSIS — J189 Pneumonia, unspecified organism: Secondary | ICD-10-CM

## 2021-08-09 DIAGNOSIS — Z6841 Body Mass Index (BMI) 40.0 and over, adult: Secondary | ICD-10-CM

## 2021-08-09 DIAGNOSIS — F259 Schizoaffective disorder, unspecified: Secondary | ICD-10-CM

## 2021-08-09 DIAGNOSIS — E1159 Type 2 diabetes mellitus with other circulatory complications: Secondary | ICD-10-CM | POA: Diagnosis not present

## 2021-08-09 DIAGNOSIS — F17210 Nicotine dependence, cigarettes, uncomplicated: Secondary | ICD-10-CM

## 2021-08-09 DIAGNOSIS — I10 Essential (primary) hypertension: Secondary | ICD-10-CM

## 2021-08-09 DIAGNOSIS — Z720 Tobacco use: Secondary | ICD-10-CM

## 2021-08-09 LAB — GLUCOSE, POCT (MANUAL RESULT ENTRY): POC Glucose: 182 mg/dl — AB (ref 70–99)

## 2021-08-09 MED ORDER — ALBUTEROL SULFATE HFA 108 (90 BASE) MCG/ACT IN AERS
2.0000 | INHALATION_SPRAY | Freq: Four times a day (QID) | RESPIRATORY_TRACT | 2 refills | Status: DC | PRN
  Filled 2021-08-09: qty 8.5, 25d supply, fill #0

## 2021-08-09 MED ORDER — FUROSEMIDE 40 MG PO TABS: ORAL_TABLET | ORAL | 0 refills | Status: DC

## 2021-08-09 NOTE — Assessment & Plan Note (Signed)
Remains in homeless shelter without permanent housing

## 2021-08-09 NOTE — Assessment & Plan Note (Signed)
Continue current medications. 

## 2021-08-09 NOTE — Assessment & Plan Note (Signed)
Hypertension improved control no change in medications we will reassess renal panel and blood counts

## 2021-08-09 NOTE — Assessment & Plan Note (Signed)
Exacerbation resolved we will monitor

## 2021-08-09 NOTE — Assessment & Plan Note (Signed)
Continue with current mental health medications he is not a suicide risk

## 2021-08-09 NOTE — Patient Instructions (Signed)
Complete screening labs will be obtained at this visit  Albuterol inhaler will be refilled we will bring this to you at the shelter  Reduce your furosemide to 1 daily we will change this in your pill organizer on Wednesday  Return to see Dr. Delford Field 4 months here at the clinic but we will continue to follow you weekly at the shelter

## 2021-08-09 NOTE — Assessment & Plan Note (Signed)
Reduce furosemide to 40 mg daily and reassess renal panel

## 2021-08-09 NOTE — Assessment & Plan Note (Signed)
Continue with nocturnal CPAP

## 2021-08-09 NOTE — Assessment & Plan Note (Signed)
Compensated diastolic heart failure continue current medications including diuresis but reduce dose to 40 mg furosemide daily from twice daily

## 2021-08-09 NOTE — Assessment & Plan Note (Signed)
Pneumonia resolved.

## 2021-08-09 NOTE — Assessment & Plan Note (Signed)
  .   Current smoking consumption amount: 1PPD or more  Dicsussion on advise to quit smoking and smoking impacts: CV lung impacts  Patient's willingness to quit:  Not ready to quit completely  Methods to quit smoking discussed:  Behavioral mod and nicotine replacemtn  No prescribed  Medication management of smoking session drugs discussed: no meds prescribed  Setting quit date not established   Follow-up arranged  Weekly at shelter clinic   Time spent counseling the patient:  5min   

## 2021-08-09 NOTE — Assessment & Plan Note (Signed)
Continue with 70/30 insulin for now 30 units twice daily continue [place] daily continue with Tradjenta and metformin as well

## 2021-08-10 LAB — COMPREHENSIVE METABOLIC PANEL
ALT: 18 IU/L (ref 0–44)
AST: 11 IU/L (ref 0–40)
Albumin/Globulin Ratio: 1.5 (ref 1.2–2.2)
Albumin: 3.8 g/dL — ABNORMAL LOW (ref 4.1–5.1)
Alkaline Phosphatase: 98 IU/L (ref 44–121)
BUN/Creatinine Ratio: 16 (ref 9–20)
BUN: 14 mg/dL (ref 6–24)
Bilirubin Total: 0.2 mg/dL (ref 0.0–1.2)
CO2: 27 mmol/L (ref 20–29)
Calcium: 9 mg/dL (ref 8.7–10.2)
Chloride: 99 mmol/L (ref 96–106)
Creatinine, Ser: 0.86 mg/dL (ref 0.76–1.27)
Globulin, Total: 2.6 g/dL (ref 1.5–4.5)
Glucose: 147 mg/dL — ABNORMAL HIGH (ref 70–99)
Potassium: 4 mmol/L (ref 3.5–5.2)
Sodium: 142 mmol/L (ref 134–144)
Total Protein: 6.4 g/dL (ref 6.0–8.5)
eGFR: 107 mL/min/{1.73_m2} (ref >59)

## 2021-08-10 LAB — CBC WITH DIFFERENTIAL/PLATELET
Basophils Absolute: 0.1 10*3/uL (ref 0.0–0.2)
Basos: 1 %
EOS (ABSOLUTE): 0.3 10*3/uL (ref 0.0–0.4)
Eos: 3 %
Hematocrit: 52.5 % — ABNORMAL HIGH (ref 37.5–51.0)
Hemoglobin: 16.1 g/dL (ref 13.0–17.7)
Immature Grans (Abs): 0.1 10*3/uL (ref 0.0–0.1)
Immature Granulocytes: 1 %
Lymphocytes Absolute: 2.3 10*3/uL (ref 0.7–3.1)
Lymphs: 20 %
MCH: 25.4 pg — ABNORMAL LOW (ref 26.6–33.0)
MCHC: 30.7 g/dL — ABNORMAL LOW (ref 31.5–35.7)
MCV: 83 fL (ref 79–97)
Monocytes Absolute: 0.7 10*3/uL (ref 0.1–0.9)
Monocytes: 6 %
Neutrophils Absolute: 8.3 10*3/uL — ABNORMAL HIGH (ref 1.4–7.0)
Neutrophils: 69 %
Platelets: 228 10*3/uL (ref 150–450)
RBC: 6.34 x10E6/uL — ABNORMAL HIGH (ref 4.14–5.80)
RDW: 16.1 % — ABNORMAL HIGH (ref 11.6–15.4)
WBC: 11.7 10*3/uL — ABNORMAL HIGH (ref 3.4–10.8)

## 2021-08-11 ENCOUNTER — Other Ambulatory Visit: Payer: Self-pay | Admitting: Critical Care Medicine

## 2021-08-11 ENCOUNTER — Encounter: Payer: Self-pay | Admitting: Physician Assistant

## 2021-08-11 NOTE — Progress Notes (Cosign Needed)
Pt seen by Dr Delford Field.  PW saw pt earlier this week in the office. He drew labs. Results are below.  Renal function has improved. He was told ok to reduce Lasix to 40 mg qd.  He needs more depends  Prednisone has been completed. He still coughs, non-productive. Swelling is ok.   SBP 109 in the office, elevated here today BP (!) 175/104   Pulse (!) 101   Resp (!) 22   Wt (!) 370 lb (167.8 kg)   SpO2 97%   BMI 56.26 kg/m  CBG 215 R eye has normalized, d/c the drops.  Lungs are clear, he is using the inhaler appropriately.  R>L LE edema, but improved from previous.   He is keeping up with the insulin, the staff gets it for him from the refrig. He is on 70/30 u bid ac.     Latest Ref Rng & Units 08/09/2021   11:20 AM 07/27/2021    8:29 AM 07/26/2021   10:37 PM  CBC  WBC 3.4 - 10.8 x10E3/uL 11.7  12.4  15.0   Hemoglobin 13.0 - 17.7 g/dL 48.5  46.2  70.3   Hematocrit 37.5 - 51.0 % 52.5  50.8  50.0   Platelets 150 - 450 x10E3/uL 228  202  209        Latest Ref Rng & Units 08/09/2021   11:20 AM 07/30/2021    4:01 AM 07/29/2021    5:29 AM  BMP  Glucose 70 - 99 mg/dL 500  938  182   BUN 6 - 24 mg/dL 14  14  15    Creatinine 0.76 - 1.27 mg/dL  9.93  7.16   BUN/Creat Ratio 9 - 20 16     Sodium 134 - 144 mmol/L 142  138  142   Potassium 3.5 - 5.2 mmol/L 4.0  3.8  3.9   Chloride 96 - 106 mmol/L 99  101  104   CO2 20 - 29 mmol/L 27  30  27    Calcium 8.7 - 10.2 mg/dL 9.0  8.4  8.5     Ellianne Gowen, PA-C 08/11/2021 2:54 PM

## 2021-08-11 NOTE — Progress Notes (Signed)
Pt aware of resuls 

## 2021-08-12 ENCOUNTER — Other Ambulatory Visit (HOSPITAL_COMMUNITY): Payer: Self-pay

## 2021-08-12 ENCOUNTER — Encounter: Payer: Self-pay | Admitting: *Deleted

## 2021-08-12 ENCOUNTER — Other Ambulatory Visit: Payer: Self-pay | Admitting: *Deleted

## 2021-08-12 MED ORDER — DAPAGLIFLOZIN PROPANEDIOL 10 MG PO TABS
10.0000 mg | ORAL_TABLET | Freq: Every day | ORAL | 3 refills | Status: DC
  Filled 2021-08-12: qty 30, 30d supply, fill #0

## 2021-08-12 NOTE — Congregational Nurse Program (Signed)
Pillbox made for pt

## 2021-08-18 ENCOUNTER — Other Ambulatory Visit (HOSPITAL_COMMUNITY): Payer: Self-pay

## 2021-08-18 ENCOUNTER — Other Ambulatory Visit: Payer: Self-pay | Admitting: Critical Care Medicine

## 2021-08-18 ENCOUNTER — Encounter: Payer: Self-pay | Admitting: *Deleted

## 2021-08-18 ENCOUNTER — Encounter: Payer: Self-pay | Admitting: Physician Assistant

## 2021-08-18 MED ORDER — INSULIN ASPART PROT & ASPART (70-30 MIX) 100 UNIT/ML PEN
40.0000 [IU] | PEN_INJECTOR | Freq: Two times a day (BID) | SUBCUTANEOUS | 1 refills | Status: DC
  Filled 2021-08-18: qty 15, 19d supply, fill #0

## 2021-08-18 MED ORDER — PREDNISONE 20 MG PO TABS
20.0000 mg | ORAL_TABLET | Freq: Every day | ORAL | 0 refills | Status: AC
  Filled 2021-08-18: qty 3, 3d supply, fill #0

## 2021-08-18 MED ORDER — UNIFINE PENTIPS 31G X 5 MM MISC
1 refills | Status: DC
  Filled 2021-08-18: qty 100, 30d supply, fill #0
  Filled 2021-08-18: qty 100, fill #0
  Filled 2022-01-05: qty 100, 30d supply, fill #1

## 2021-08-18 MED ORDER — FUROSEMIDE 40 MG PO TABS
40.0000 mg | ORAL_TABLET | Freq: Two times a day (BID) | ORAL | 0 refills | Status: DC
  Filled 2021-08-18: qty 60, 30d supply, fill #0

## 2021-08-18 NOTE — Congregational Nurse Program (Unsigned)
Weight 380.6

## 2021-08-18 NOTE — Progress Notes (Cosign Needed Addendum)
Pt seen by Dr Delford Field.   Pt had a spell of weakness, SOB this am. Was wheezing some.  Not sure if swelling. Has been able to stay inside a little more, repairs are almost complete.   Says the insulin needles are bending on his skin, 32 ga is apparently too small.   Still smoking 1/2 ppd, coughing some non-productive. Does not hear himself wheeze much.   Today's Vitals   08/18/21 1428  BP: (!) 176/107  Pulse: (!) 115  Resp: (!) 22  SpO2: 95%  Weight: (!) 380 lb 9.6 oz (172.6 kg)   Body mass index is 57.87 kg/m.  CBG 112  Has exp wheeze and rales bases.  2+ edema R, 1+ L Has been SOB, has been using the alb inhaler 4-5 x day at times recently.   R eye has been puffy and a little red.   Has been taking all the pills in the organizer, currently on Lasix 40 mg qd >> 40 mg bid.   Prednisone 20 mg qd x 3 days.  70/30 insulin,  go up to 40 u bid. Larger needle sent in as well.   [name], PA-C 08/18/2021 2:36 PM

## 2021-08-19 ENCOUNTER — Other Ambulatory Visit (HOSPITAL_COMMUNITY): Payer: Self-pay

## 2021-08-19 ENCOUNTER — Other Ambulatory Visit: Payer: Self-pay

## 2021-08-23 ENCOUNTER — Other Ambulatory Visit: Payer: Self-pay

## 2021-08-25 ENCOUNTER — Other Ambulatory Visit (HOSPITAL_COMMUNITY): Payer: Self-pay

## 2021-08-25 ENCOUNTER — Other Ambulatory Visit: Payer: Self-pay | Admitting: Critical Care Medicine

## 2021-08-25 ENCOUNTER — Encounter: Payer: Self-pay | Admitting: Critical Care Medicine

## 2021-08-25 MED ORDER — LINAGLIPTIN 5 MG PO TABS
5.0000 mg | ORAL_TABLET | Freq: Every day | ORAL | 0 refills | Status: DC
  Filled 2021-08-25: qty 30, 30d supply, fill #0

## 2021-08-25 MED ORDER — FUROSEMIDE 40 MG PO TABS: 40.0000 mg | ORAL_TABLET | Freq: Every day | ORAL | 0 refills | Status: DC

## 2021-08-25 MED ORDER — INSULIN ASPART PROT & ASPART (70-30 MIX) 100 UNIT/ML PEN
45.0000 [IU] | PEN_INJECTOR | Freq: Two times a day (BID) | SUBCUTANEOUS | 1 refills | Status: DC
  Filled 2021-08-25: qty 15, 17d supply, fill #0

## 2021-08-26 ENCOUNTER — Encounter: Payer: Self-pay | Admitting: *Deleted

## 2021-08-26 NOTE — Progress Notes (Signed)
Patient seen today in the [facility] shelter clinic he states his dyspnea is improved he has had bedbugs on some of his clothing these have had to be cleaned.  He also remains incontinent of urine and his bedding has to be cleaned frequently by staff.  He has been urinating quite a bit on the high-dose Lasix.  Weight is down to 373 pounds and on arrival blood pressure 138/83 blood sugar 224 heart rate 107 saturation 91% on room air  On exam chest is improved decreased wheezing no rales skin is clear there is no evidence of any bedbug bites or rash there is less pedal edema abdomen is protuberant right eye is improved  Impression is that of diastolic heart failure, sleep apnea, COPD, type 2 diabetes not as well-controlled due to dietary noncompliance  Patient will be looking for healthier options the social worker will help [place] with this in the kitchen  Patient will increase insulin dosing to 45 units twice daily of 70/30 insulin  We will reduce Lasix dose to 40 mg daily Shan Levans

## 2021-08-26 NOTE — Congregational Nurse Program (Signed)
Filled pillbox, obtained incont bedpads for incont at night.

## 2021-09-01 ENCOUNTER — Encounter: Payer: Self-pay | Admitting: Physician Assistant

## 2021-09-01 ENCOUNTER — Other Ambulatory Visit: Payer: Self-pay | Admitting: Physician Assistant

## 2021-09-01 ENCOUNTER — Other Ambulatory Visit (HOSPITAL_COMMUNITY): Payer: Self-pay

## 2021-09-01 MED ORDER — CEFDINIR 300 MG PO CAPS
300.0000 mg | ORAL_CAPSULE | Freq: Two times a day (BID) | ORAL | 0 refills | Status: AC
Start: 2021-09-01 — End: 2022-01-18
  Filled 2021-09-01 – 2022-01-05 (×2): qty 14, 7d supply, fill #0

## 2021-09-01 NOTE — Progress Notes (Signed)
Pt seen by Dr Delford Field.  Pt c/o worsening SOB.  He denies increasing LE edema.   Today's Vitals   09/01/21 1509  BP: (!) 144/94  Pulse: (!) 107  Temp: (!) 97 F (36.1 C)  Weight: (!) 374 lb (169.6 kg)   Body mass index is 56.87 kg/m.  Pt w/ insp and exp wheeze on exam.  He is compliant w/ CPAP qhs  Pt has purulent secretion on nasal exam. Sx sinus infection. Cefdinir sent in, pt given nasal rinse and instructions on use.   He got an apartment!!!  It is between [place] and [place]. 1 bedroom w/ [address]. Move date not set.  He will have wraparound services to help him.   [name], PA-C 09/01/2021 3:19 PM

## 2021-09-02 ENCOUNTER — Encounter: Payer: Self-pay | Admitting: *Deleted

## 2021-09-02 ENCOUNTER — Other Ambulatory Visit: Payer: Self-pay

## 2021-09-02 NOTE — Congregational Nurse Program (Signed)
Pillbox filled 

## 2021-09-07 ENCOUNTER — Ambulatory Visit (INDEPENDENT_AMBULATORY_CARE_PROVIDER_SITE_OTHER): Payer: Self-pay | Admitting: Podiatry

## 2021-09-07 DIAGNOSIS — M79676 Pain in unspecified toe(s): Secondary | ICD-10-CM

## 2021-09-07 DIAGNOSIS — M7751 Other enthesopathy of right foot: Secondary | ICD-10-CM

## 2021-09-07 DIAGNOSIS — E1142 Type 2 diabetes mellitus with diabetic polyneuropathy: Secondary | ICD-10-CM

## 2021-09-07 DIAGNOSIS — B351 Tinea unguium: Secondary | ICD-10-CM

## 2021-09-07 MED ORDER — TRIAMCINOLONE ACETONIDE 40 MG/ML IJ SUSP
20.0000 mg | Freq: Once | INTRAMUSCULAR | Status: AC
  Administered 2021-09-07: 20 mg

## 2021-09-08 ENCOUNTER — Encounter: Payer: Self-pay | Admitting: Physician Assistant

## 2021-09-08 NOTE — Progress Notes (Signed)
Pt seen by Dr Delford Field.  Breathing is ok, not coughing up much.   Tingling pain in center chest.   Edema is about the same.   O2 sats 88% on the machine, 92% on pulse Ox. Sx more c/w 92%  Mild exp wheeze on exam. 1+ LE edema. Slight edema below R eye but no signs infection.   Smoking cessation and diet compliance encouraged.   [name], PA-C 09/08/2021 2:51 PM

## 2021-09-08 NOTE — Progress Notes (Addendum)
He presents today chief complaint of painful nails bilateral.  He is also complaining of pain to the anterior lower right ankle.  Objective: Toenails are long thick yellow dystrophic-like mycotic painful palpation as well as debridement.  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  He does have pain on palpation of the sinus tarsi of the right foot.  Large overlying lipoma.  He also has pain on end range of motion of the subtalar joint.  Assessment: Pain limb secondary to onychomycosis.  Subtalar joint capsulitis right foot.  Plan: Debridement of toenails 1 through 5 bilateral covered service secondary to pain.  Injected the subtalar joint today 20 mg Kenalog 5 mg Marcaine point maximal tenderness.  Tolerated procedure well.

## 2021-09-09 ENCOUNTER — Other Ambulatory Visit (HOSPITAL_COMMUNITY): Payer: Self-pay

## 2021-09-10 ENCOUNTER — Other Ambulatory Visit (HOSPITAL_COMMUNITY): Payer: Self-pay

## 2021-09-10 ENCOUNTER — Other Ambulatory Visit: Payer: Self-pay | Admitting: *Deleted

## 2021-09-10 MED ORDER — TAMSULOSIN HCL 0.4 MG PO CAPS
0.4000 mg | ORAL_CAPSULE | Freq: Every day | ORAL | 3 refills | Status: DC
  Filled 2021-09-10: qty 30, 30d supply, fill #0

## 2021-09-10 MED ORDER — METFORMIN HCL 500 MG PO TABS
1000.0000 mg | ORAL_TABLET | Freq: Two times a day (BID) | ORAL | 0 refills | Status: DC
  Filled 2021-09-10: qty 120, 30d supply, fill #0

## 2021-09-10 MED ORDER — RISPERIDONE 4 MG PO TABS
4.0000 mg | ORAL_TABLET | Freq: Every day | ORAL | 0 refills | Status: DC
  Filled 2021-09-10: qty 30, 30d supply, fill #0

## 2021-09-10 MED ORDER — GABAPENTIN 300 MG PO CAPS
ORAL_CAPSULE | ORAL | 0 refills | Status: DC
  Filled 2021-09-10: qty 150, 30d supply, fill #0

## 2021-09-15 ENCOUNTER — Encounter: Payer: Self-pay | Admitting: *Deleted

## 2021-09-16 ENCOUNTER — Other Ambulatory Visit: Payer: Self-pay | Admitting: *Deleted

## 2021-09-16 MED ORDER — FAMOTIDINE 20 MG PO TABS
20.0000 mg | ORAL_TABLET | Freq: Two times a day (BID) | ORAL | 0 refills | Status: DC
  Filled 2021-09-16: qty 60, 30d supply, fill #0

## 2021-09-16 MED ORDER — DULOXETINE HCL 60 MG PO CPEP
60.0000 mg | ORAL_CAPSULE | Freq: Every day | ORAL | 3 refills | Status: DC
  Filled 2021-09-16: qty 30, 30d supply, fill #0

## 2021-09-16 MED ORDER — DAPAGLIFLOZIN PROPANEDIOL 10 MG PO TABS
10.0000 mg | ORAL_TABLET | Freq: Every day | ORAL | 3 refills | Status: DC
  Filled 2021-09-16: qty 30, 30d supply, fill #0

## 2021-09-16 MED ORDER — ATORVASTATIN CALCIUM 10 MG PO TABS
10.0000 mg | ORAL_TABLET | Freq: Every day | ORAL | 3 refills | Status: DC
  Filled 2021-09-16: qty 30, 30d supply, fill #0

## 2021-09-17 ENCOUNTER — Other Ambulatory Visit (HOSPITAL_COMMUNITY): Payer: Self-pay

## 2021-09-20 ENCOUNTER — Other Ambulatory Visit (HOSPITAL_COMMUNITY): Payer: Self-pay

## 2021-09-22 ENCOUNTER — Other Ambulatory Visit (HOSPITAL_COMMUNITY): Payer: Self-pay

## 2021-09-22 ENCOUNTER — Other Ambulatory Visit: Payer: Self-pay | Admitting: Critical Care Medicine

## 2021-09-22 ENCOUNTER — Encounter: Payer: Self-pay | Admitting: Critical Care Medicine

## 2021-09-22 MED ORDER — PREDNISONE 10 MG PO TABS
40.0000 mg | ORAL_TABLET | Freq: Every day | ORAL | 0 refills | Status: DC
  Filled 2021-09-22: qty 12, 3d supply, fill #0

## 2021-09-22 MED ORDER — CEFDINIR 300 MG PO CAPS
300.0000 mg | ORAL_CAPSULE | Freq: Two times a day (BID) | ORAL | 0 refills | Status: AC
  Filled 2021-09-22: qty 10, 5d supply, fill #0

## 2021-09-23 ENCOUNTER — Encounter: Payer: Self-pay | Admitting: Critical Care Medicine

## 2021-09-23 NOTE — Progress Notes (Signed)
Patient seen at the [place] [facility] shelter clinic he now has an apartment lease he will be at [employer]  Patient notes increased cough increased wheezing shortness of breath no change in edema no chest pain.  On exam blood pressure 125/75 oxygenation is good 95%  Chest shows inspiratory and expiratory wheeze poor airflow  Cardiac exam unremarkable  2+ pedal edema  Abdomen protuberant  Impression is that of COPD with mild exacerbation plan is to receive prednisone 40 mg a day for 3 days and cefdinir 300 mg twice daily for 5 days  No other medication changes  A letter will be prepared so he can have his service animal in his apartment  Raymond Hood

## 2021-09-29 ENCOUNTER — Other Ambulatory Visit: Payer: Self-pay | Admitting: Critical Care Medicine

## 2021-09-29 ENCOUNTER — Other Ambulatory Visit (HOSPITAL_COMMUNITY): Payer: Self-pay

## 2021-09-29 ENCOUNTER — Encounter: Payer: Self-pay | Admitting: Critical Care Medicine

## 2021-09-29 MED ORDER — FLUTICASONE PROPIONATE 50 MCG/ACT NA SUSP
2.0000 | Freq: Every day | NASAL | 6 refills | Status: DC
  Filled 2021-09-29: qty 16, 30d supply, fill #0
  Filled 2022-01-05: qty 16, 30d supply, fill #1

## 2021-09-29 MED ORDER — LEVOFLOXACIN 750 MG PO TABS
750.0000 mg | ORAL_TABLET | Freq: Every day | ORAL | 0 refills | Status: AC
  Filled 2021-09-29: qty 5, 5d supply, fill #0

## 2021-09-30 ENCOUNTER — Encounter: Payer: Self-pay | Admitting: *Deleted

## 2021-09-30 ENCOUNTER — Other Ambulatory Visit: Payer: Self-pay | Admitting: *Deleted

## 2021-09-30 MED ORDER — HYDROXYZINE HCL 25 MG PO TABS
25.0000 mg | ORAL_TABLET | Freq: Three times a day (TID) | ORAL | 2 refills | Status: DC | PRN
  Filled 2021-09-30: qty 90, 30d supply, fill #0

## 2021-09-30 NOTE — Congregational Nurse Program (Signed)
Pt seen weekly. Continues to have some resp. Concern, levaquin prescribed. Will pick up with usual meds and start 9/21

## 2021-09-30 NOTE — Progress Notes (Signed)
This a 51 year old male primary care patient of mine seen at the [facility] clinic he is having increased cough and congestion for the past several days on exam weight is 370 pounds blood pressure 160/100 pulse 108 saturation 89% room air   Chest exam shows expiratory next Tory wheeze nares show bilateral purulence  Impression is that of progressive acute sinusitis bronchitis plan will be to receive for the patient Flonase 2 sprays each nostril daily and Levaquin 750 mg daily for 5 days

## 2021-10-01 ENCOUNTER — Other Ambulatory Visit (HOSPITAL_COMMUNITY): Payer: Self-pay

## 2021-10-06 ENCOUNTER — Other Ambulatory Visit: Payer: Self-pay | Admitting: Family Medicine

## 2021-10-06 ENCOUNTER — Encounter: Payer: Self-pay | Admitting: Physician Assistant

## 2021-10-06 ENCOUNTER — Other Ambulatory Visit (HOSPITAL_COMMUNITY): Payer: Self-pay

## 2021-10-06 MED ORDER — DOXYCYCLINE HYCLATE 100 MG PO CAPS
100.0000 mg | ORAL_CAPSULE | Freq: Two times a day (BID) | ORAL | 0 refills | Status: AC
  Filled 2021-10-06: qty 10, 5d supply, fill #0

## 2021-10-06 MED ORDER — PREDNISONE 20 MG PO TABS
40.0000 mg | ORAL_TABLET | Freq: Every day | ORAL | 0 refills | Status: AC
  Filled 2021-10-06: qty 10, 5d supply, fill #0

## 2021-10-06 NOTE — Progress Notes (Addendum)
51 yo with COPD, HFpEF, obesity, T2DM, and numerous other medical issues following up after treatment of a sinusitis and bronchitis.  Was feeling better until about 2 days ago.  Progressive SOB, cough with brown sputum.  No fevers, chills, chest pain.  No PND or orthopnea.  He's been treated for resp issues since 9/13.  Today's Vitals   10/06/21 1537  BP: (!) 148/88  Pulse: (!) 116  SpO2: (!) 83%  Weight: (!) 169.6 kg   Body mass index is 56.87 kg/m.  Walks into clinic with normal effort (actually moving well compared to what I've noted previously), sitting down with mildly increased resp effort.  Non toxic on exam. RRR, tachycardic Diffuse expiratory wheezing throughout all lung fields.  Mildly increased wob.  O2 sats at lowest 83% briefly, but they rebound and he maintains mostly between 87 low 90's with prolonged monitoring. Edema to bilateral LE's   COPD exacerbation with diffuse wheezing on exam, purulent cough.  Ddx includes pneumonia.  Recently treated with levaquin.  Will start doxycycline and prednisone given wheezing.  His O2 sats fluctuated during the visit, but mostly between 87 to low 90's.  He's non toxic appearing, will attempt outpatient treatment.  Given his LE edema and weight gain, double dose of lasix for 3 days.  Hospital precautions given.  Will have nurse [name] check in tmrw.  Blurriness to vision has been going on over a year, maybe related to BG?  Needs ophtho follow up, but this is less acute than his current respiratory issues.  Addendum 9/29 - updated vitals from 9/28 RR 22, HR 93, BP 155/97, 95% on RA.   [name], MD  ----------------------  Pt seen by Dr [name].  He got some relief from the ABX.  Smoking 1-1/2 pack/day.  Is SOB. Is on Lasix 40 mg po daily.  No fevers or chills. Cough +brownish chunky sputum.  Completed 5 days of Levaquin this week. He will take another round of Levaquin and doxycycline, plus steroids.   The Lasix really works on  him.   Wt Readings from Last 3 Encounters:  10/06/21 (!) 374 lb (169.6 kg)  09/30/21 (!) 370 lb 3.2 oz (167.9 kg)  09/01/21 (!) 374 lb (169.6 kg)   Has exp wheezing throughout his lungs. O2 sats were initially 83% but improved to 90% with deep inspiration for lung exam.   MD recommended he use the albuterol 4 x day for the next few days.   He has not been checking his blood sugars. He is taking his insulin most of the time. CBG 148.   His vision is acting up. Some days it is fuzzy and some days it is ok. Not aware of any association between this and his blood sugar.   If he starts running fevers or her otherwise gets worse >> ER.   [name], PA-C 10/06/2021 3:54 PM

## 2021-10-07 ENCOUNTER — Other Ambulatory Visit (HOSPITAL_COMMUNITY): Payer: Self-pay

## 2021-10-07 ENCOUNTER — Other Ambulatory Visit: Payer: Self-pay | Admitting: *Deleted

## 2021-10-07 MED ORDER — LINAGLIPTIN 5 MG PO TABS
5.0000 mg | ORAL_TABLET | Freq: Every day | ORAL | 0 refills | Status: DC
  Filled 2021-10-07: qty 30, 30d supply, fill #0

## 2021-10-07 MED ORDER — PRESERVISION AREDS 2 PO CAPS
1.0000 | ORAL_CAPSULE | Freq: Two times a day (BID) | ORAL | 6 refills | Status: DC
  Filled 2021-10-07: qty 120, 60d supply, fill #0
  Filled 2022-01-05: qty 120, 60d supply, fill #1

## 2021-10-07 NOTE — Congregational Nurse Program (Signed)
Pt feels better today he states. Vs and weight checked. Pillbox filled. Chatted about new apartment and needs for the apartment.

## 2021-10-08 ENCOUNTER — Other Ambulatory Visit (HOSPITAL_COMMUNITY): Payer: Self-pay

## 2021-10-11 ENCOUNTER — Other Ambulatory Visit (HOSPITAL_COMMUNITY): Payer: Self-pay

## 2021-10-12 ENCOUNTER — Other Ambulatory Visit (HOSPITAL_COMMUNITY): Payer: Self-pay

## 2021-10-13 ENCOUNTER — Other Ambulatory Visit: Payer: Self-pay | Admitting: Critical Care Medicine

## 2021-10-13 ENCOUNTER — Other Ambulatory Visit (HOSPITAL_COMMUNITY): Payer: Self-pay

## 2021-10-13 ENCOUNTER — Encounter: Payer: Self-pay | Admitting: Physician Assistant

## 2021-10-13 MED ORDER — METRONIDAZOLE 500 MG PO TABS
500.0000 mg | ORAL_TABLET | Freq: Three times a day (TID) | ORAL | 0 refills | Status: AC
  Filled 2021-10-13: qty 30, 10d supply, fill #0

## 2021-10-13 NOTE — Progress Notes (Cosign Needed)
Feeling a little better, walking a little better.    Still coughing some, productive at times. ABX completed, having diarrhea, incontinent at times. 5-7/day Was given Align probiotics bid and Flagyl 500 mg tid. Needs albuterol MDI.  Today's Vitals   10/13/21 1409  BP: (!) 159/99  Pulse: (!) 101  SpO2: 93%  Weight: (!) 376 lb 12.8 oz (170.9 kg)   Body mass index is 57.29 kg/m.  No fevers or chills.   Has not been checking CBGs. Is 219 today.   Lung sounds have improved. Trace LE edema.  Abd diffusely tender no rebound or guarding   [name], PA-C 10/13/2021 2:24 PM  Asencion Noble

## 2021-10-14 ENCOUNTER — Other Ambulatory Visit: Payer: Self-pay | Admitting: Critical Care Medicine

## 2021-10-14 ENCOUNTER — Other Ambulatory Visit: Payer: Self-pay | Admitting: *Deleted

## 2021-10-14 ENCOUNTER — Encounter: Payer: Self-pay | Admitting: Critical Care Medicine

## 2021-10-14 MED ORDER — RISPERIDONE 4 MG PO TABS
4.0000 mg | ORAL_TABLET | Freq: Every day | ORAL | 0 refills | Status: DC
  Filled 2021-10-14: qty 30, 30d supply, fill #0

## 2021-10-14 MED ORDER — TAMSULOSIN HCL 0.4 MG PO CAPS
0.4000 mg | ORAL_CAPSULE | Freq: Every day | ORAL | 3 refills | Status: DC
  Filled 2021-10-14: qty 30, 30d supply, fill #0

## 2021-10-14 MED ORDER — GABAPENTIN 300 MG PO CAPS
ORAL_CAPSULE | ORAL | 0 refills | Status: DC
  Filled 2021-10-14: qty 150, 30d supply, fill #0

## 2021-10-14 MED ORDER — DAPAGLIFLOZIN PROPANEDIOL 10 MG PO TABS
10.0000 mg | ORAL_TABLET | Freq: Every day | ORAL | 3 refills | Status: DC
  Filled 2021-10-14: qty 30, 30d supply, fill #0

## 2021-10-14 MED ORDER — DAPAGLIFLOZIN PROPANEDIOL 10 MG PO TABS
10.0000 mg | ORAL_TABLET | Freq: Every day | ORAL | 3 refills | Status: DC
  Filled 2021-10-14: qty 90, 90d supply, fill #0
  Filled 2021-10-28: qty 30, 30d supply, fill #0

## 2021-10-14 MED ORDER — DULOXETINE HCL 60 MG PO CPEP
60.0000 mg | ORAL_CAPSULE | Freq: Every day | ORAL | 3 refills | Status: DC
  Filled 2021-10-14: qty 30, 30d supply, fill #0

## 2021-10-14 MED ORDER — METFORMIN HCL 500 MG PO TABS
1000.0000 mg | ORAL_TABLET | Freq: Two times a day (BID) | ORAL | 0 refills | Status: DC
  Filled 2021-10-14: qty 120, 30d supply, fill #0

## 2021-10-15 ENCOUNTER — Encounter: Payer: Self-pay | Admitting: *Deleted

## 2021-10-15 ENCOUNTER — Other Ambulatory Visit (HOSPITAL_COMMUNITY): Payer: Self-pay

## 2021-10-15 ENCOUNTER — Other Ambulatory Visit: Payer: Self-pay

## 2021-10-15 NOTE — Congregational Nurse Program (Signed)
Dr wright saw pt Pillbox complete 

## 2021-10-20 ENCOUNTER — Other Ambulatory Visit: Payer: Self-pay | Admitting: *Deleted

## 2021-10-20 ENCOUNTER — Encounter: Payer: Self-pay | Admitting: Physician Assistant

## 2021-10-20 ENCOUNTER — Other Ambulatory Visit (HOSPITAL_COMMUNITY): Payer: Self-pay

## 2021-10-20 ENCOUNTER — Other Ambulatory Visit: Payer: Self-pay | Admitting: Critical Care Medicine

## 2021-10-20 MED ORDER — ALBUTEROL SULFATE HFA 108 (90 BASE) MCG/ACT IN AERS
2.0000 | INHALATION_SPRAY | Freq: Four times a day (QID) | RESPIRATORY_TRACT | 2 refills | Status: DC | PRN
  Filled 2021-10-20: qty 6.7, 25d supply, fill #0

## 2021-10-20 MED ORDER — PREDNISONE 10 MG PO TABS
40.0000 mg | ORAL_TABLET | Freq: Every day | ORAL | 0 refills | Status: DC
  Filled 2021-10-20: qty 20, 5d supply, fill #0

## 2021-10-20 NOTE — Progress Notes (Signed)
Pt seen by Dr Joya Gaskins.  He is having trouble walking, there is pain in his back and going down both legs. He is very weak, has trouble moving self w/out assistance due to the pain.  Xray from March reviewed, has DJD that could have worsened in the last few months. 2 lidocaine patches were applied.   He requests Tylenol, will make sure it is in his med box.  He was given a rollator, but is not mobile enough to use it.   Cough is ok. Has some beige sputum. Very wheezy today, needs albuterol refill. He is to use it 4 x day for now. He will get a short-term prednisone pulse rx.   He is more short of breath than usual. Wt up 9 lbs in a week. He will increase Lasix to 80 mg qd x 3 days, then back to 40 mg qd.   Furniture is supposed to be coming on Oct 24  Not sure about swelling.   Diarrhea has resolved.   Today's Vitals   10/20/21 1540  BP: (!) 153/100  Pulse: (!) 106  SpO2: 92%  Weight: (!) 383 lb 6.4 oz (173.9 kg)   Body mass index is 58.3 kg/m. Wt Readings from Last 3 Encounters:  10/20/21 (!) 383 lb 6.4 oz (173.9 kg)  10/15/21 (!) 374 lb 8 oz (169.9 kg)  10/13/21 (!) 376 lb 12.8 oz (170.9 kg)   We talked to [place], ok for him to stay inside when residents are asked to leave during the day, for 2 days.   [name], PA-C 10/20/2021 3:58 PM

## 2021-10-21 ENCOUNTER — Encounter: Payer: Self-pay | Admitting: Critical Care Medicine

## 2021-10-21 ENCOUNTER — Other Ambulatory Visit: Payer: Self-pay | Admitting: *Deleted

## 2021-10-21 ENCOUNTER — Other Ambulatory Visit (HOSPITAL_COMMUNITY): Payer: Self-pay

## 2021-10-21 MED ORDER — ATORVASTATIN CALCIUM 10 MG PO TABS
10.0000 mg | ORAL_TABLET | Freq: Every day | ORAL | 3 refills | Status: DC
  Filled 2021-10-21: qty 30, 30d supply, fill #0

## 2021-10-21 MED ORDER — FAMOTIDINE 20 MG PO TABS
20.0000 mg | ORAL_TABLET | Freq: Two times a day (BID) | ORAL | 0 refills | Status: DC
  Filled 2021-10-21: qty 60, 30d supply, fill #0

## 2021-10-21 NOTE — Progress Notes (Signed)
Patient seen today in [facility] clinic in follow-up he is improved today he is stronger he will get a rollator  Chest still showed wheezing  Patient will start pulsed prednisone at this visit and took his albuterol inhaler

## 2021-10-22 ENCOUNTER — Other Ambulatory Visit: Payer: Self-pay

## 2021-10-27 ENCOUNTER — Encounter: Payer: Self-pay | Admitting: Physician Assistant

## 2021-10-27 NOTE — Progress Notes (Signed)
He is to get in his place on 10/24.  His birthday is [birth year], that is a Wednesday.   O2 sats 93% on RA.   He has no household stuff for his apartment.  Furniture is to be delivered on 10/24, he can move in then. Says the apartment is handicapped, has a walk-in shower.    He does not feel he has extra fluid on board.  The wheezing has improved, he is using his inhaler 4-5 x day. Was asked to use in only 4 x day, and report if he needs it more.    His R eye is fuzzier than usual, sometimes it improves w/ blinking. It has been like that x 8 months, has never completely cleared up.Marland Kitchen  His CPAP is still leaking some, the air goes into his R eye. He has redness and puffiness below his R eye, but no pain.   On exam, the eye looks normal, reacts well to light, no redness. He was given moisturizing eye drops.   He requests Tylenol, got  120-500 mg caplets. He will take it 3 x day, [name] will put it in his med box.   He would like to see the barber.   At one point, he rented a house with a stripper pole in the living room.   CBG 237, had a hot dog and chips with a salad for lunch.  He needs a nutritionist, one that will help him select easy to prepare foods that are healthy.  He will need a microwave. We will try to get him one.   He cannot have cereal, it gives him bubble guts. He does not know if he is lactose intolerant.   Today's Vitals   10/27/21 1354  BP: 137/89  Pulse: (!) 112  SpO2: 93%  Weight: (!) 379 lb 9.6 oz (172.2 kg)   Body mass index is 57.72 kg/m. Wt Readings from Last 3 Encounters:  10/27/21 (!) 379 lb 9.6 oz (172.2 kg)  10/20/21 (!) 383 lb 6.4 oz (173.9 kg)  10/15/21 (!) 374 lb 8 oz (169.9 kg)    Mahnoor Mathisen, PA-C 10/27/2021 2:15 PM

## 2021-10-28 ENCOUNTER — Other Ambulatory Visit: Payer: Self-pay

## 2021-11-02 NOTE — Congregational Nurse Program (Signed)
Dept: [phone]   Congregational Nurse Program Note  Date of Encounter: 11/02/2021  Clinic visit for complaint of back pain not relieved by one Tylenol taken this morning.  Given Tylenol 500 mg per order that was written on [facility] clinic visit on 10/27/21.   BP 130/90, pulse 98, unable to get O2 Sat had just finished a cigarette.  Is to move to an apartment this afternoon, made appointment for 11/03/21 at 3:30P with Dr. Thereasa Solo for elevated BP. Past Medical History: Past Medical History:  Diagnosis Date   Bipolar 1 disorder, depressed ([place])    CAP (community acquired pneumonia) 07/14/2021   COPD (chronic obstructive pulmonary disease) ([place])    Depression with anxiety    Diabetes mellitus type II, non insulin dependent ([place]) 11/2020   A1c 7.3   Fall at home, initial encounter 05/28/2020   Last Assessment & Plan:  Formatting of this note might be different from the original. Acute onset, occurred few days ago.  Reports having a mechanical fall, fell forward injuring his left side including arm, knee.  Denies any vision changes, head trauma or loss of consciousness.  Patient is being referred to the emergency room for lower GI bleed, can also get evaluated for his injuries.   Gastric ulcer    when on NSAIDs   GERD (gastroesophageal reflux disease)    Helicobacter pylori antibody positive 07/04/2018   Formatting of this note might be different from the original. Started triple therapy 06/30/18.   Needs HP stool antigen in 8 weeks,  Off PPI for 10 days.   HTN (hypertension)    OSA (obstructive sleep apnea)    not currently on CPAP   Rheumatoid arthritis ([place])    Right kidney mass, 2.8 cm 11/2020   Tobacco abuse     Encounter Details:  CNP Questionnaire - 11/02/21 0940       Questionnaire   Ask client: Do you give verbal consent for me to treat you today? Yes    Student Assistance N/A    Location Patient [facility]    Visit Setting with Client Organization    Patient Status  Unhoused   Will be moving to an apartment this afternoon   Insurance Medicaid    Insurance/Financial Assistance Referral N/A    Medication N/A    Medical Provider Yes    Screening Referrals Made Made Appointment for Client    Medical Referrals Made [facility]    Medical Appointment Made [facility] PCP[facility]    Recently w/o PCP, now 1st time PCP visit completed due to CNs referral or appointment made N/A    Food Have Food Insecurities    Transportation Need transportation assistance    Housing/Utilities N/A    Interpersonal Safety Do not feel safe at current residence    Interventions [employer];Advocate/Support    Abnormal to Normal Screening Since Last CN Visit N/A    Screenings CN Performed Blood Glucose;Pulse Ox    Sent Client to Lab for: N/A    Did client attend any of the following based off CNs referral or appointments made? N/A    ED Visit Averted N/A    Life-Saving Intervention Made N/A      Questionnaire   Do you give verbal consent to treat you today? Yes    Location Patient [facility] or Organization    Patient Status Homeless    Insurance [facility]    Insurance Referral N/A  Medication N/A    Screening Referrals N/A    Intervention Blood pressure;Educate;Support;Counsel

## 2021-11-03 ENCOUNTER — Ambulatory Visit: Payer: Medicaid Other | Admitting: Physician Assistant

## 2021-11-04 ENCOUNTER — Other Ambulatory Visit: Payer: Self-pay | Admitting: *Deleted

## 2021-11-04 MED ORDER — HYDROXYZINE HCL 25 MG PO TABS
25.0000 mg | ORAL_TABLET | Freq: Three times a day (TID) | ORAL | 2 refills | Status: DC | PRN
  Filled 2021-11-04: qty 90, 30d supply, fill #0

## 2021-11-04 MED ORDER — LINAGLIPTIN 5 MG PO TABS
5.0000 mg | ORAL_TABLET | Freq: Every day | ORAL | 0 refills | Status: DC
  Filled 2021-11-04: qty 30, 30d supply, fill #0

## 2021-11-05 ENCOUNTER — Other Ambulatory Visit (HOSPITAL_COMMUNITY): Payer: Self-pay

## 2021-11-09 ENCOUNTER — Telehealth: Payer: Self-pay | Admitting: Critical Care Medicine

## 2021-11-09 NOTE — Telephone Encounter (Signed)
Raymond Hood   when you return, this pt is a PCP pt of mine and [facility] resident and just moved to independent apartment.  I am wondering with his severe Copd /chf/ dm / htn  hi readmit condition if we could get him enrolled in the EMT paramedicine program  Dr Joya Gaskins

## 2021-11-11 NOTE — Telephone Encounter (Signed)
Referral sent to [facility]

## 2021-11-22 ENCOUNTER — Telehealth (HOSPITAL_COMMUNITY): Payer: Self-pay

## 2021-11-22 NOTE — Telephone Encounter (Signed)
Attempted to reach Me. [facility] for setting up paramedicine. No answer on call- left message and will continue to reach out.   Maralyn Sago, EMT-Paramedic [phone] 11/22/2021

## 2021-12-01 ENCOUNTER — Ambulatory Visit: Payer: Self-pay | Admitting: *Deleted

## 2021-12-01 NOTE — Telephone Encounter (Signed)
Summary: Pt complains of pain in the bottom of his feet   Pt friend Doug Sou requests that pt be contacted because he complains that he can not walk due to pain in the bottom of his feet. Delaney Meigs stated pt is a diabetic but she is unsure of his Rx status. Delaney Meigs asked that pt be contacted asap at [phone]       Chief Complaint: bottom of feet painful and unable to walk Symptoms: bottom of feet N/T. Pain with walking and goes up to legs. Reports being out of all of his medications x 1-2 weeks. Reports Nurse [name] has not brought him any medications.  Frequency: 1-2 days  Pertinent Negatives: Patient denies na . Patient reports he does not check his blood glucose.  Disposition: [x] ED /[] Urgent Care (no appt availability in office) / [] Appointment(In office/virtual)/ []  [facility]/ [] Home Care/ [] Refused Recommended Disposition /[] [facility]/ []  Follow-up with PCP Additional Notes:   Instructed patient to check his blood glucose . Recommended to go to ED due to reports not able to walk due to pain. Patient reports he has been out of all medications x 1-2 weeks. Asked if patient called pharmacy and patient reports has not brought his medications. Confirmed address listed in chart [address]  . Please advise regarding refills of all of medications. Needs care management assistance       Reason for Disposition  Numbness (i.e., loss of sensation) in foot or toes  (Exception: Just tingling; numbness present > 2 weeks.)  Answer Assessment - Initial Assessment Questions 1. ONSET: "When did the pain start?"      1-2 days ago  2. LOCATION: "Where is the pain located?"      Bottom of feet and legs when walking  3. PAIN: "How bad is the pain?"    (Scale 1-10; or mild, moderate, severe)  - MILD (1-3): doesn't interfere with normal activities.   - MODERATE (4-7): interferes with normal activities (e.g., work or school) or awakens from  sleep, limping.   - SEVERE (8-10): excruciating pain, unable to do any normal activities, unable to walk.      Reports unable to walk due to pain  4. WORK OR EXERCISE: "Has there been any recent work or exercise that involved this part of the body?"      Na  5. CAUSE: "What do you think is causing the foot pain?"     Patient has not been taking medication for approx. 1-2 weeks 6. OTHER SYMPTOMS: "Do you have any other symptoms?" (e.g., leg pain, rash, fever, numbness)     N/T in bottom of feet and legs when walking  7. PREGNANCY: "Is there any chance you are pregnant?" "When was your last menstrual period?"     na  Protocols used: Foot Pain-A-AH

## 2021-12-06 ENCOUNTER — Other Ambulatory Visit: Payer: Self-pay

## 2021-12-06 ENCOUNTER — Other Ambulatory Visit (HOSPITAL_COMMUNITY): Payer: Self-pay

## 2021-12-06 MED ORDER — ATORVASTATIN CALCIUM 10 MG PO TABS
10.0000 mg | ORAL_TABLET | Freq: Every day | ORAL | 3 refills | Status: DC
  Filled 2021-12-06 (×2): qty 30, 30d supply, fill #0

## 2021-12-06 MED ORDER — FAMOTIDINE 20 MG PO TABS
20.0000 mg | ORAL_TABLET | Freq: Two times a day (BID) | ORAL | 0 refills | Status: DC
  Filled 2021-12-06: qty 60, 30d supply, fill #0

## 2021-12-06 MED ORDER — METFORMIN HCL 500 MG PO TABS
1000.0000 mg | ORAL_TABLET | Freq: Two times a day (BID) | ORAL | 0 refills | Status: DC
  Filled 2021-12-06: qty 120, 30d supply, fill #0

## 2021-12-06 MED ORDER — TAMSULOSIN HCL 0.4 MG PO CAPS
0.4000 mg | ORAL_CAPSULE | Freq: Every day | ORAL | 3 refills | Status: DC
  Filled 2021-12-06: qty 30, 30d supply, fill #0
  Filled 2022-01-05: qty 30, 30d supply, fill #1

## 2021-12-06 MED ORDER — INSULIN ASPART PROT & ASPART (70-30 MIX) 100 UNIT/ML PEN
45.0000 [IU] | PEN_INJECTOR | Freq: Two times a day (BID) | SUBCUTANEOUS | 1 refills | Status: DC
  Filled 2021-12-06: qty 15, 17d supply, fill #0
  Filled 2022-01-05: qty 15, 17d supply, fill #1

## 2021-12-06 MED ORDER — GABAPENTIN 300 MG PO CAPS
ORAL_CAPSULE | ORAL | 0 refills | Status: DC
  Filled 2021-12-06: qty 150, 30d supply, fill #0

## 2021-12-06 MED ORDER — POLYETHYLENE GLYCOL 3350 17 GM/SCOOP PO POWD
17.0000 g | Freq: Every day | ORAL | 0 refills | Status: DC | PRN
  Filled 2021-12-06: qty 238, 14d supply, fill #0

## 2021-12-06 MED ORDER — LINAGLIPTIN 5 MG PO TABS
5.0000 mg | ORAL_TABLET | Freq: Every day | ORAL | 0 refills | Status: DC
  Filled 2021-12-06: qty 30, 30d supply, fill #0

## 2021-12-06 MED ORDER — DAPAGLIFLOZIN PROPANEDIOL 10 MG PO TABS
10.0000 mg | ORAL_TABLET | Freq: Every day | ORAL | 3 refills | Status: DC
  Filled 2021-12-06: qty 30, 30d supply, fill #0
  Filled 2022-01-05: qty 30, 30d supply, fill #1
  Filled 2022-01-05: qty 90, 90d supply, fill #1

## 2021-12-06 MED ORDER — BUDESONIDE-FORMOTEROL FUMARATE 160-4.5 MCG/ACT IN AERO
2.0000 | INHALATION_SPRAY | Freq: Two times a day (BID) | RESPIRATORY_TRACT | 12 refills | Status: DC
  Filled 2021-12-06: qty 10.2, 30d supply, fill #0
  Filled 2022-01-05: qty 10.2, 30d supply, fill #1

## 2021-12-06 MED ORDER — RISPERIDONE 4 MG PO TABS
4.0000 mg | ORAL_TABLET | Freq: Every day | ORAL | 0 refills | Status: DC
  Filled 2021-12-06: qty 30, 30d supply, fill #0

## 2021-12-06 MED ORDER — FUROSEMIDE 40 MG PO TABS
40.0000 mg | ORAL_TABLET | Freq: Every day | ORAL | 0 refills | Status: DC
  Filled 2021-12-06: qty 30, 30d supply, fill #0
  Filled 2022-01-05: qty 30, 30d supply, fill #1

## 2021-12-06 MED ORDER — DULOXETINE HCL 60 MG PO CPEP
60.0000 mg | ORAL_CAPSULE | Freq: Every day | ORAL | 3 refills | Status: DC
  Filled 2021-12-06: qty 30, 30d supply, fill #0

## 2021-12-06 MED ORDER — ATORVASTATIN CALCIUM 10 MG PO TABS
10.0000 mg | ORAL_TABLET | Freq: Every day | ORAL | 3 refills | Status: DC
  Filled 2021-12-09: qty 30, 30d supply, fill #0
  Filled 2022-01-05: qty 30, 30d supply, fill #1

## 2021-12-06 MED ORDER — ALBUTEROL SULFATE HFA 108 (90 BASE) MCG/ACT IN AERS
2.0000 | INHALATION_SPRAY | Freq: Four times a day (QID) | RESPIRATORY_TRACT | 2 refills | Status: DC | PRN
  Filled 2021-12-06 (×2): qty 6.7, 25d supply, fill #0
  Filled 2022-01-05: qty 6.7, 25d supply, fill #1

## 2021-12-06 MED ORDER — DULOXETINE HCL 60 MG PO CPEP
60.0000 mg | ORAL_CAPSULE | Freq: Every day | ORAL | 3 refills | Status: DC
  Filled 2021-12-06 – 2021-12-09 (×2): qty 30, 30d supply, fill #0
  Filled 2022-01-05: qty 30, 30d supply, fill #1

## 2021-12-06 NOTE — Telephone Encounter (Signed)
Called patient and he is aware medication he also stated how was he supposed to get his medication he has no transportation

## 2021-12-06 NOTE — Telephone Encounter (Signed)
Noted  

## 2021-12-06 NOTE — Telephone Encounter (Signed)
Patient is in need of some med refills on Miralax , Risperdal,Pepcid,Lasix, gabapentin, Linagliptin, metformin everything else has refills on his med list.    Patient also needs an office visit when do you want to see him?    Please advise

## 2021-12-06 NOTE — Addendum Note (Signed)
Addended by: Shan Levans E on: 12/06/2021 04:51 PM   Modules accepted: Orders

## 2021-12-06 NOTE — Telephone Encounter (Signed)
Can both [facility] and our pharmacy mail meds to his home??

## 2021-12-06 NOTE — Addendum Note (Signed)
Addended by: Shan Levans E on: 12/06/2021 01:44 PM   Modules accepted: Orders

## 2021-12-06 NOTE — Telephone Encounter (Addendum)
See him in next two weeks   remind him he has appt with podiatry Dec 4  Refils sent to [facility] as he is uninsured  except for Marcelline Deist this has to be obtained via PASS at our pharmacy

## 2021-12-09 ENCOUNTER — Telehealth (HOSPITAL_COMMUNITY): Payer: Self-pay

## 2021-12-09 ENCOUNTER — Other Ambulatory Visit (HOSPITAL_COMMUNITY): Payer: Self-pay

## 2021-12-09 ENCOUNTER — Other Ambulatory Visit: Payer: Self-pay

## 2021-12-09 MED ORDER — HYDROXYZINE HCL 25 MG PO TABS
25.0000 mg | ORAL_TABLET | Freq: Three times a day (TID) | ORAL | 2 refills | Status: DC | PRN
  Filled 2021-12-09 – 2022-01-05 (×2): qty 90, 30d supply, fill #0

## 2021-12-09 NOTE — Addendum Note (Signed)
Addended by: Shan Levans E on: 12/09/2021 02:56 PM   Modules accepted: Orders

## 2021-12-09 NOTE — Telephone Encounter (Signed)
Medications picked up and delivered to patient. Pill boxes filled and accurate.   Missing multivitamin and hydroxyzine, pharmacy did not have. Will forward update to Dr. Delford Field.   Initial paramedicine visit complete.   Maralyn Sago, EMT-Paramedic [phone] 12/09/2021

## 2021-12-09 NOTE — Telephone Encounter (Signed)
noted 

## 2021-12-09 NOTE — Progress Notes (Signed)
Paramedicine Encounter    Patient ID: Raymond Hood, male    DOB: 24-Feb-1970, 51 y.o.   MRN: [id number]   Arrived for initial home visit for Mr. Raymond Hood he greeted me at the door of his ground floor apartment alert and oriented. He walked me to the kitchen area where we sat at a table. He appeared short of breath upon ambulating. He reports he has not had his medications in a few weeks. He has no pills or inhalers at home. I contacted pharmacies and confirmed where prescriptions would be I plan to pick up same today and bring them back to fill pill box.   I obtained vitals and assessment.  No weight obtained- no at home scale.   BP- 130/82 HR- 60 RR- 16  O2- 98% CBG- 217   Lungs- wheezing throughout, he has not used an inhaler in a few weeks.   Lower leg swelling present.   He clothes on that appeared had been soiled in and not washed in several days. He reports he has some clothes but not many clothes at home to choose from. I will see about some donations for same.   He reports he has food in the home which I see. He receives food stamps monthly he says between $100-$200. He has no income. He has no transportation and refuses to use public transit.   He reports using 6-8 cigs per day.   He has a voucher for rent.   Currently un-insured.   He has a cellphone but doesn't know how the bill gets paid.   He is inquiring about a nurse aide and a approval for his dog to live with him in his apartment.   I went and picked up meds at [facility]. I filled two weeks of pill boxes. (No multivitamin or hydroxyzine) I will inquire about same.   I educated him on diet, med compliance, diabetes management and HF management.   He agreed with plans. I will get a PCP follow up scheduled with [place] and look into clothes and missing meds (hydroxyzine and multivitamin) I plan to see [place] in two weeks. He was provided with my information and he knows to reach out if needed. He agreed with  paramedicine and has no other needs at present.   Home visit complete.   [name], [place] 12/09/2021        Patient Care Team: [name], MD as PCP - General (Pulmonary Disease)  Patient Active Problem List   Diagnosis Date Noted   AKI (acute kidney injury) ([place]) 07/27/2021   Anxiety and depression 07/14/2021   Essential hypertension 07/14/2021   Chronic diastolic heart failure ([place]) 01/14/2021   Type 2 diabetes mellitus with hyperglycemia, with long-term current use of insulin ([place]) 12/14/2020   GERD (gastroesophageal reflux disease) 10/15/2020   Tobacco abuse 10/15/2020   Hypertension associated with diabetes ([place]) 10/15/2020   Renal mass, right 10/15/2020   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   Morbid obesity with BMI of 50.0-59.9, adult ([place]) 10/15/2020   Homeless 10/15/2020   MDD (major depressive disorder), recurrent episode ([place]) 09/28/2020   Anxiety 09/28/2020   Schizo affective schizophrenia ([place]) 09/28/2020   Numbness and tingling in both hands 05/28/2020   Adrenal hyperplasia ([place]) 06/05/2019   Low back pain 08/29/2018   Multiple joint pain 08/29/2018   OSA/OHS 08/17/2018   Exocrine pancreatic insufficiency 07/02/2018   Asthma without status asthmaticus 03/27/2018   Epidermal inclusion cyst 07/20/2012   Psoriasis, unspecified  06/28/2012   Erectile dysfunction 06/28/2012   ADHD 11/17/1981    Current Outpatient Medications:    albuterol (VENTOLIN HFA) 108 (90 Base) MCG/ACT inhaler, Inhale 2 puffs into the lungs every 6  hours as needed for wheezing or shortness of breath., Disp: 6.7 g, Rfl: 2   atorvastatin (LIPITOR) 10 MG tablet, Take 1 tablet (10 mg total) by mouth once daily., Disp: 30 tablet, Rfl: 3   Blood Glucose Monitoring Suppl (ACCU-CHEK GUIDE) w/Device KIT, Use up to four times daily as directed., Disp: 1 kit, Rfl: 0   budesonide-formoterol (SYMBICORT) 160-4.5 MCG/ACT inhaler, Inhale 2 puffs into the lungs 2  (two) times daily., Disp: 10.2 g, Rfl: 12   dapagliflozin propanediol (FARXIGA) 10 MG TABS tablet, Take 1 tablet (10 mg total) by mouth daily., Disp: 30 tablet, Rfl: 3   DULoxetine (CYMBALTA) 60 MG capsule, Take 1 capsule (60 mg total) by mouth daily., Disp: 30 capsule, Rfl: 3   famotidine (PEPCID) 20 MG tablet, Take 1 tablet (20 mg total) by mouth 2 (two) times daily., Disp: 60 tablet, Rfl: 0   Fingerstix Lancets MISC, Use as directed up to 4 times daily., Disp: 100 each, Rfl: 0   fluticasone (FLONASE) 50 MCG/ACT nasal spray, Place 2 sprays into both nostrils daily., Disp: 16 g, Rfl: 6   furosemide (LASIX) 40 MG tablet, Take 1 tablet (40 mg total) by mouth daily., Disp: 80 tablet, Rfl: 0   gabapentin (NEURONTIN) 300 MG capsule, Take 2 capsules in the morning AND 1 capsule midday AND 2 capsules every evening., Disp: 150 capsule, Rfl: 0   glucose blood test strip, Use four times daily as directed., Disp: 100 each, Rfl: 0   hydrOXYzine (ATARAX) 25 MG tablet, Take 1 tablet (25 mg total) by mouth 3 (three) times daily as needed., Disp: 90 tablet, Rfl: 2   insulin aspart protamine - aspart (NOVOLOG 70/30 MIX) (70-30) 100 UNIT/ML FlexPen, Inject 45 Units into the skin 2 (two) times daily with a meal., Disp: 15 mL, Rfl: 1   Insulin Pen Needle (UNIFINE PENTIPS) 31G X 5 MM MISC, use with insulin pen, Disp: 100 each, Rfl: 1   linagliptin (TRADJENTA) 5 MG TABS tablet, Take 1 tablet (5 mg total) by mouth daily., Disp: 30 tablet, Rfl: 0   metFORMIN (GLUCOPHAGE) 500 MG tablet, Take 2 tablets (1,000 mg total) by mouth 2 (two) times daily with a meal., Disp: 120 tablet, Rfl: 0   Multiple Vitamins-Minerals (PRESERVISION AREDS 2) CAPS, Take 1 capsule by mouth 2 (two) times daily with food. Take 1 in the morning and 1 in the evening., Disp: 120 capsule, Rfl: 6   olopatadine (PATANOL) 0.1 % ophthalmic solution, Place 1 drop into the right eye 2 (two) times daily., Disp: 5 mL, Rfl: 12   polyethylene glycol powder  (GLYCOLAX/MIRALAX) 17 GM/SCOOP powder, Take 17 g by mouth daily as needed for moderate constipation or severe constipation., Disp: 238 g, Rfl: 0   risperidone (RISPERDAL) 4 MG tablet, Take 1 tablet (4 mg total) by mouth once daily., Disp: 30 tablet, Rfl: 0   tamsulosin (FLOMAX) 0.4 MG CAPS capsule, Take 1 capsule (0.4 mg total) by mouth daily., Disp: 30 capsule, Rfl: 3 No Known Allergies   Social History   Socioeconomic History   Marital status: Single    Spouse name: Not on file   Number of children: Not on file   Years of education: Not on file   Highest education level: Not on file  Occupational History  Not on file  Tobacco Use   Smoking status: Every Day    Packs/day: 1.00    Types: Cigarettes   Smokeless tobacco: Never  Vaping Use   Vaping Use: Never used  Substance and Sexual Activity   Alcohol use: Not Currently   Drug use: Never   Sexual activity: Not on file  Other Topics Concern   Not on file  Social History Narrative   Not on file   Social Determinants of Health   Financial Resource Strain: Not on file  Food Insecurity: Not on file  Transportation Needs: Not on file  Physical Activity: Not on file  Stress: Not on file  Social Connections: Not on file  Intimate Partner Violence: Not on file    Physical Exam      Future Appointments  Date Time Provider [place]  01/11/2022  3:45 PM Gardiner Barefoot, DPM [facility]     ACTION: Home visit completed

## 2021-12-09 NOTE — Telephone Encounter (Signed)
Wonderful  cannot rx MVI  will rx hydroxyzine and mail to him  TY so much !!!!

## 2021-12-10 ENCOUNTER — Other Ambulatory Visit (HOSPITAL_COMMUNITY): Payer: Self-pay

## 2021-12-13 ENCOUNTER — Ambulatory Visit: Payer: Self-pay | Admitting: Podiatry

## 2021-12-14 NOTE — Telephone Encounter (Signed)
noted 

## 2021-12-22 ENCOUNTER — Other Ambulatory Visit (HOSPITAL_COMMUNITY): Payer: Self-pay

## 2021-12-23 ENCOUNTER — Other Ambulatory Visit (HOSPITAL_COMMUNITY): Payer: Self-pay

## 2021-12-23 NOTE — Progress Notes (Signed)
Paramedicine Encounter    Patient ID: Raymond Hood, male    DOB: Jan 12, 1970, 51 y.o.   MRN: [id number]   Arrived for home visit where Raymond Hood was seated at the kitchen table with his neighbor and friend Raymond Hood. Raymond Hood reports to be feeling good today with no complaints at present. He reports he has been compliant with his medications over the last two weeks except his insulin- he has not been taking it. I explained the risks of missing this medication and then took time to educate him on the medications one at a time and the insulin on how to prep it and inject it. He verbalized understanding and was able to do it himself administering 45 units after checking his vitals.   BP- 130/56 HR- 100 O2- 96% RR- 16  CBG- 240  Lungs- clear    I filled pill box for two weeks.  Refills will be needed on all meds. Raymond Hood reports he was approved for [employer] however I see family planning on his chart-  I received a call from [place] at [employer] reporting that he was approved for [employer]. I will have care navigator check for same.   If so he is interested in bubble packs and delivery pharmacy as well as a continuous glucose monitor. I will follow up on same once we confirm medicaid status.   Appointments reviewed and confirmed. Raymond Hood reports he and Raymond Hood will set up transportation using Medicaid transport which he has the number for- I gave him appointment information and he knows to reach out if help is needed for same.   I plan to meet Raymond Hood in clinic next week with Dr. Joya Hood. He agreed to bring all meds and pillbox to same.   Home visit complete.   Raymond Hood, Raymond Hood 12/23/2021     Patient Care Team: Raymond Stain, MD as PCP - General (Pulmonary Disease)  Patient Active Problem List   Diagnosis Date Noted   AKI (acute kidney injury) ([place]) 07/27/2021   Anxiety and depression 07/14/2021   Essential hypertension 07/14/2021   Chronic diastolic  heart failure ([place]) 01/14/2021   Type 2 diabetes mellitus with hyperglycemia, with long-term current use of insulin ([place]) 12/14/2020   GERD (gastroesophageal reflux disease) 10/15/2020   Tobacco abuse 10/15/2020   Hypertension associated with diabetes ([place]) 10/15/2020   Renal mass, right 10/15/2020   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   Morbid obesity with BMI of 50.0-59.9, adult ([place]) 10/15/2020   Homeless 10/15/2020   MDD (major depressive disorder), recurrent episode ([place]) 09/28/2020   Anxiety 09/28/2020   Schizo affective schizophrenia ([place]) 09/28/2020   Numbness and tingling in both hands 05/28/2020   Adrenal hyperplasia ([place]) 06/05/2019   Low back pain 08/29/2018   Multiple joint pain 08/29/2018   OSA/OHS 08/17/2018   Exocrine pancreatic insufficiency 07/02/2018   Asthma without status asthmaticus 03/27/2018   Epidermal inclusion cyst 07/20/2012   Psoriasis, unspecified 06/28/2012   Erectile dysfunction 06/28/2012   ADHD 11/17/1981    Current Outpatient Medications:    albuterol (VENTOLIN HFA) 108 (90 Base) MCG/ACT inhaler, Inhale 2 puffs into the lungs every 6  hours as needed for wheezing or shortness of breath., Disp: 6.7 g, Rfl: 2   atorvastatin (LIPITOR) 10 MG tablet, Take 1 tablet (10 mg total) by mouth once daily., Disp: 30 tablet, Rfl: 3   Blood Glucose Monitoring Suppl (ACCU-CHEK GUIDE) w/Device KIT, Use up to four times daily as directed., Disp: 1 kit, Rfl: 0  budesonide-formoterol (SYMBICORT) 160-4.5 MCG/ACT inhaler, Inhale 2 puffs into the lungs 2 (two) times daily., Disp: 10.2 g, Rfl: 12   dapagliflozin propanediol (FARXIGA) 10 MG TABS tablet, Take 1 tablet (10 mg total) by mouth daily., Disp: 30 tablet, Rfl: 3   DULoxetine (CYMBALTA) 60 MG capsule, Take 1 capsule (60 mg total) by mouth daily., Disp: 30 capsule, Rfl: 3   famotidine (PEPCID) 20 MG tablet, Take 1 tablet (20 mg total) by mouth 2 (two) times daily., Disp: 60 tablet, Rfl: 0   Fingerstix Lancets  MISC, Use as directed up to 4 times daily., Disp: 100 each, Rfl: 0   fluticasone (FLONASE) 50 MCG/ACT nasal spray, Place 2 sprays into both nostrils daily., Disp: 16 g, Rfl: 6   furosemide (LASIX) 40 MG tablet, Take 1 tablet (40 mg total) by mouth daily., Disp: 80 tablet, Rfl: 0   gabapentin (NEURONTIN) 300 MG capsule, Take 2 capsules in the morning AND 1 capsule midday AND 2 capsules every evening., Disp: 150 capsule, Rfl: 0   glucose blood test strip, Use four times daily as directed., Disp: 100 each, Rfl: 0   hydrOXYzine (ATARAX) 25 MG tablet, Take 1 tablet (25 mg total) by mouth 3 (three) times daily as needed., Disp: 90 tablet, Rfl: 2   insulin aspart protamine - aspart (NOVOLOG 70/30 MIX) (70-30) 100 UNIT/ML FlexPen, Inject 45 Units into the skin 2 (two) times daily with a meal., Disp: 15 mL, Rfl: 1   Insulin Pen Needle (UNIFINE PENTIPS) 31G X 5 MM MISC, use with insulin pen, Disp: 100 each, Rfl: 1   linagliptin (TRADJENTA) 5 MG TABS tablet, Take 1 tablet (5 mg total) by mouth daily., Disp: 30 tablet, Rfl: 0   metFORMIN (GLUCOPHAGE) 500 MG tablet, Take 2 tablets (1,000 mg total) by mouth 2 (two) times daily with a meal., Disp: 120 tablet, Rfl: 0   Multiple Vitamins-Minerals (PRESERVISION AREDS 2) CAPS, Take 1 capsule by mouth 2 (two) times daily with food. Take 1 in the morning and 1 in the evening. (Patient not taking: Reported on 12/09/2021), Disp: 120 capsule, Rfl: 6   olopatadine (PATANOL) 0.1 % ophthalmic solution, Place 1 drop into the right eye 2 (two) times daily. (Patient not taking: Reported on 12/09/2021), Disp: 5 mL, Rfl: 12   polyethylene glycol powder (GLYCOLAX/MIRALAX) 17 GM/SCOOP powder, Take 17 g by mouth daily as needed for moderate constipation or severe constipation. (Patient not taking: Reported on 12/09/2021), Disp: 238 g, Rfl: 0   risperidone (RISPERDAL) 4 MG tablet, Take 1 tablet (4 mg total) by mouth once daily., Disp: 30 tablet, Rfl: 0   tamsulosin (FLOMAX) 0.4 MG CAPS  capsule, Take 1 capsule (0.4 mg total) by mouth daily., Disp: 30 capsule, Rfl: 3 No Known Allergies   Social History   Socioeconomic History   Marital status: Single    Spouse name: Not on file   Number of children: Not on file   Years of education: Not on file   Highest education level: Not on file  Occupational History   Not on file  Tobacco Use   Smoking status: Every Day    Packs/day: 1.00    Types: Cigarettes   Smokeless tobacco: Never  Vaping Use   Vaping Use: Never used  Substance and Sexual Activity   Alcohol use: Not Currently   Drug use: Never   Sexual activity: Not on file  Other Topics Concern   Not on file  Social History Narrative   Not on file  Social Determinants of Health   Financial Resource Strain: Not on file  Food Insecurity: Not on file  Transportation Needs: Not on file  Physical Activity: Not on file  Stress: Not on file  Social Connections: Not on file  Intimate Partner Violence: Not on file    Physical Exam      Future Appointments  Date Time Provider [place]  12/30/2021  9:10 AM Raymond Stain, MD [facility] None  01/11/2022  3:45 PM Gardiner Barefoot, DPM [facility]     ACTION: Home visit completed

## 2021-12-24 ENCOUNTER — Other Ambulatory Visit: Payer: Self-pay

## 2021-12-27 ENCOUNTER — Other Ambulatory Visit: Payer: Self-pay

## 2021-12-27 ENCOUNTER — Telehealth (HOSPITAL_COMMUNITY): Payer: Self-pay

## 2021-12-27 NOTE — Telephone Encounter (Signed)
Stoy contacted me reporting that he talked with medicaid services and they confirmed his [facility] and he does not have access to transportation and will need this assistance getting to his appointment with Dr. Delford Field for Thursday 12/21 at 9:10.   I will forward to Robyne Peers for assistance with same.   He left another voicemail asking how we can assist for him to re-apply for medicaid- will see how we can assist after the expansion has been in place, as he has no income and he is of qualifying age. I will continue to follow up.   Maralyn Sago, EMT-Paramedic [phone] 12/27/2021

## 2021-12-28 ENCOUNTER — Telehealth: Payer: Self-pay

## 2021-12-28 ENCOUNTER — Other Ambulatory Visit: Payer: Self-pay

## 2021-12-28 NOTE — Telephone Encounter (Signed)
[place] MLP REFERRAL FORM  TO: Medical-Legal Partnership (MLP) Program, [employer] [facility] [place]  FAX: [phone] (no cover sheet needed)  FROM: INFORMATION ABOUT REFERRING HEALTH CARE PROFESSIONAL We need the following information to be able to confirm that we received this referral. If we are unable to contact the referred patient/caregiver, we may contact you to request additional information.   Name: Robyne Peers, RN Department: [facility] [address] [place] [address] [phone]    Direct Phone #: [phone] Email: [email or web address]  Date: 12/28/21   Reason(s) for Referral: Potential Legal Needs That May Be Addressed by [facility] SOCIAL DRIVERS OF HEALTH THAT MAY HAVE A LEGAL REMEDY  Food Insecurity, Income Security, Access to [employer] (1) []  Food Stamps/Nutrition Assistance []  Social Security Disability Benefits (SSI or SSDI) [x]  Medicaid []  Help with enrollment in insurance (2) Housing Security/Safety of Housing Conditions  []  Eviction  []  Loss of housing subsidy for public housing []  Mortgage foreclosure []  Housing repairs/unsafe conditions in rental housing []  Housing discrimination  Personal/ Family Safety and Individual Rights Other:  []  Domestic Violence Protective Order []  Please describe (3):     (1)  With the exception of helping with enrollment in , we do not assist with initial applications for public benefits. We may be able to assist those whose applications are denied or whose benefits are terminated or reduced.  (2) Federally-certified can help with applications for .  (3) We recommend contacting [facility] [place] to request a de-identified consult prior to making a referral for topics not included  here.     Deadlines/Emergencies Is there a hearing or appeal deadline within the next 10 days? [x]  No  []  Not sure []  Yes. When?   Has the patient/caregiver received court papers about this issue?  [x]  No  []  Not sure []  Yes. When?   Is there another legal emergency? [x]  No  []  Not sure []  Yes. Please describe:      3. Please provide other information to help [employer] understand the patient's/caregiver's potential legal problem(s), including any concerns about how the problem(s) might affect the patient's/caregiver's health and well-being. Please include with this referral form any relevant documents if the patient/caregiver has them, such as a termination notice, denial letter, or court document.    He has family planning Medicaid and reports that he was denied full Medicaid.  He would appreciate any assistance Legal Aid can provide.  Thank  you.         4. Information about the Patient/Caregiver Please complete and review ALL information below with the patient/caregiver prior to sending this referral.  Patient Information Name: Raymond Hood Date of birth: 08-15-1970  If patient is   under 52 Name of parent/caregiver: n/a Parent's/caregiver's date of birth:  Address of residence: [facility] and/or mailing address: [address]  [place] [employer] Environmental education officer    Can we send mail to this address? [x]  Yes []  No *Please ask, for some, this is a matter of safety.  Phone #s *Please provide as many as possible Phone#1: [phone]      Phone #2:   Can we leave messages at each of these #s? [x]  Yes []  No *Please ask, for some, this is a matter of safety. Preferred day(s) and time(s) for calls?  Email address (not required but helpful)  Best days/ times to contact   Preferred Language  [x]  English    []  Spanish   []  Other:       5. Information About Others Involved in the Potential Legal Problem Before we can offer legal assistance to the patient/caregiver, we must  determine that we have no conflicts of interest with any potential adverse party. We will not contact the adverse party without the patient's/caregiver's permission.  If potential problem is with an agency  []  of Social Services                   []  Social Security Administration []  Other agency:   If problem is related to housing Name of landlord, , owner, housing authority, or mortgage company:  Phone #:     If problem is related to family safety or violence Name, phone number, address, and date of birth for the person(s) involved:       6. Consent for Referral and Follow Up  Option 1. Written Consent       (Initials) I give permission to [facility] to refer me to Legal Aid of [place].   I give permission to [employer] [facility] [employer] to talk with [facility] about my/my family's potential legal problem(s) to help them to determine if they can help me and/or my family members to resolve these problems.  Signature of Patient or Parent/Caregiver:                                                                                                Today's date: 12/28/21    Signature of Referring Health Professional:                                                                                             Today's date: 12/28/21  Option 2. Verbal Consent - If Written Consent is Not Possible  [x]  I have read the consent language written above (in Option 1) to the patient and/or the patient's parent/caregiver and obtained verbal permission from: [facility] of patient or parent/caregiver] Raymond Hood to make this MLP referral on 12/28/21

## 2021-12-30 ENCOUNTER — Ambulatory Visit: Payer: Medicaid Other | Admitting: Critical Care Medicine

## 2022-01-05 ENCOUNTER — Telehealth (HOSPITAL_COMMUNITY): Payer: Self-pay

## 2022-01-05 ENCOUNTER — Other Ambulatory Visit: Payer: Self-pay

## 2022-01-05 ENCOUNTER — Other Ambulatory Visit: Payer: Self-pay | Admitting: Critical Care Medicine

## 2022-01-05 ENCOUNTER — Other Ambulatory Visit (HOSPITAL_COMMUNITY): Payer: Self-pay

## 2022-01-05 MED ORDER — METFORMIN HCL 500 MG PO TABS
1000.0000 mg | ORAL_TABLET | Freq: Two times a day (BID) | ORAL | 0 refills | Status: DC
  Filled 2022-01-05: qty 120, 30d supply, fill #0

## 2022-01-05 MED ORDER — RISPERIDONE 4 MG PO TABS
4.0000 mg | ORAL_TABLET | Freq: Every day | ORAL | 0 refills | Status: DC
  Filled 2022-01-05: qty 30, 30d supply, fill #0

## 2022-01-05 MED ORDER — GABAPENTIN 300 MG PO CAPS
ORAL_CAPSULE | ORAL | 0 refills | Status: DC
  Filled 2022-01-05: qty 150, 30d supply, fill #0

## 2022-01-05 MED ORDER — FAMOTIDINE 20 MG PO TABS
20.0000 mg | ORAL_TABLET | Freq: Two times a day (BID) | ORAL | 0 refills | Status: DC
  Filled 2022-01-05: qty 60, 30d supply, fill #0

## 2022-01-05 MED ORDER — LINAGLIPTIN 5 MG PO TABS
5.0000 mg | ORAL_TABLET | Freq: Every day | ORAL | 0 refills | Status: DC
  Filled 2022-01-05: qty 30, 30d supply, fill #0

## 2022-01-05 NOTE — Telephone Encounter (Signed)
The following are needing refills sent to [facility] for Raymond Hood:  Famotidine Gabapentin Tradjenta Metformin Risperidone    Will forward to Dr. Delford Field for same.   Maralyn Sago, EMT-Paramedic [phone] 01/05/2022

## 2022-01-05 NOTE — Telephone Encounter (Signed)
Rxs sent. Dr. Delford Field will sign off on the gabapentin.

## 2022-01-07 ENCOUNTER — Telehealth: Payer: Self-pay

## 2022-01-07 NOTE — Telephone Encounter (Signed)
I called the patient to arrange cab ride to his appointment at [facility] on 01/11/2022 @ 1545. He confirmed his address and stated that he uses a cane with ambulation. He then said that he has loose stools and has been having them for years. He said Dr Delford Field is aware and has instructed him to wear pull ups but he still wants to be close to a toilet.  He was not sure that he wanted to go to the podiatrist on Tuesday, 1/2 but said his right foot/ankle has been hurting for quite awhile.  He stated that he can't get up and go like he used to.  He was very hesitant about scheduling the ride and said he would have to see how he feels.  I told him that I can call him on Tuesday 1/2 and if he is able to go to the appointment, I can arrange a cab ride then.  He was agreeable with that plan.  Transportation can be arranged the same day of an appointment because he does not need specialized transportation

## 2022-01-11 ENCOUNTER — Other Ambulatory Visit: Payer: Self-pay

## 2022-01-11 ENCOUNTER — Ambulatory Visit: Payer: Self-pay | Admitting: Podiatry

## 2022-01-11 NOTE — Telephone Encounter (Signed)
Thanks Raymond Hood also need to try somehow to get him in with me at [facility] for PCP OV

## 2022-01-11 NOTE — Telephone Encounter (Signed)
I called the patient to schedule transportation to his podiatry appointment today and he said he isn't going because his bowels are bothering him and he has not been able to get out of bed.  He reports this is not a new problem.  He said that he will need to cancel his appointment at [facility] and will re-schedule.  I spoke to [name], EMT and she is planning to see the patient this afternoon

## 2022-01-11 NOTE — Progress Notes (Signed)
Paramedicine Encounter    Patient ID: Raymond Hood, male    DOB: 25-Aug-1970, 52 y.o.   MRN: [id number]   Arrived for home visit for [place] to find him outside of his apartment smoking a cigarette while seated on a bench. He reports feeling bad over the last few weeks with pain in his legs and feet. He also complains of abdominal pain with diarrhea and moments of incontinence of his bowels.   He is wearing the same clothes he was wearing on 12/14 and 11/30. I asked him if he needed clothes and he said yes- he has no underwear. He only has one shirt and one pair of pants. He has a washer and dryer and detergent for same and says he knows how to use them but has no clothes to change in to while clothes are washing. He also has no sheets or blankets on his queen size bed. He is sleeping on the couch where there are signs of incontinence on same. He has not showered in several weeks- he says this is because he is afraid he will fall. He has a handicap accessible shower and has bathing supplies but states he thinks he needs sponges on handles to help reach certain places. His apartment is notably unkept and cleaning supplies are noted in the apartment but he does not use them. I asked Lowell Guitar, RN at [facility] if [name] could assist with clothing and bedding and she reports that the case manager at [facility] and [name] will intervene with this. I will continue to follow up on same.   He reports he has been short of breath, pain in feet and legs. He says he wants to quit smoking and is requesting some nicotene patches. I advised [place] we will ask when we see Dr.Wright later this month. He agreed.   I obtained all meds at pharmacies prior to visit and verified same. I filled pill box for two weeks. I gave him his morning meds for today during our visit.   Vitals obtained: BP- 136/70 HR- 88 O2-95% RR-18 CBG- 153 Lungs- clear  Swelling- present bilaterally   I reviewed upcoming appointments  and he provided a letter of approval for [place] Adult Medicaid. I sent same to Opal Sidles B at [facility] for verification.   I provided education on DM and overall health care goals with Bernarr during visit and he verbalized understanding.   Home visit complete. I will see Grant in two weeks.       Patient Care Team: [name], MD as PCP - General (Pulmonary Disease)  Patient Active Problem List   Diagnosis Date Noted   AKI (acute kidney injury) ([place]) 07/27/2021   Anxiety and depression 07/14/2021   Essential hypertension 07/14/2021   Chronic diastolic heart failure ([place]) 01/14/2021   Type 2 diabetes mellitus with hyperglycemia, with long-term current use of insulin ([place]) 12/14/2020   GERD (gastroesophageal reflux disease) 10/15/2020   Tobacco abuse 10/15/2020   Hypertension associated with diabetes ([place]) 10/15/2020   Renal mass, right 10/15/2020   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   Morbid obesity with BMI of 50.0-59.9, adult ([place]) 10/15/2020   Homeless 10/15/2020   MDD (major depressive disorder), recurrent episode ([place]) 09/28/2020   Anxiety 09/28/2020   Schizo affective schizophrenia ([place]) 09/28/2020   Numbness and tingling in both hands 05/28/2020   Adrenal hyperplasia ([place]) 06/05/2019   Low back pain 08/29/2018   Multiple joint pain 08/29/2018   OSA/OHS 08/17/2018  Exocrine pancreatic insufficiency 07/02/2018   Asthma without status asthmaticus 03/27/2018   Epidermal inclusion cyst 07/20/2012   Psoriasis, unspecified 06/28/2012   Erectile dysfunction 06/28/2012   ADHD 11/17/1981    Current Outpatient Medications:    albuterol (VENTOLIN HFA) 108 (90 Base) MCG/ACT inhaler, Inhale 2 puffs into the lungs every 6  hours as needed for wheezing or shortness of breath., Disp: 6.7 g, Rfl: 2   atorvastatin (LIPITOR) 10 MG tablet, Take 1 tablet (10 mg total) by mouth once daily., Disp: 30 tablet, Rfl: 3   Blood Glucose Monitoring Suppl (ACCU-CHEK GUIDE) w/Device KIT, Use up  to four times daily as directed., Disp: 1 kit, Rfl: 0   budesonide-formoterol (SYMBICORT) 160-4.5 MCG/ACT inhaler, Inhale 2 puffs into the lungs 2 (two) times daily., Disp: 10.2 g, Rfl: 12   cefdinir (OMNICEF) 300 MG capsule, Take 1 capsule (300 mg total) by mouth 2 (two) times daily for 7 days., Disp: 14 capsule, Rfl: 0   dapagliflozin propanediol (FARXIGA) 10 MG TABS tablet, Take 1 tablet (10 mg total) by mouth daily., Disp: 30 tablet, Rfl: 3   DULoxetine (CYMBALTA) 60 MG capsule, Take 1 capsule (60 mg total) by mouth daily., Disp: 30 capsule, Rfl: 3   famotidine (PEPCID) 20 MG tablet, Take 1 tablet (20 mg total) by mouth 2 (two) times daily., Disp: 60 tablet, Rfl: 0   Fingerstix Lancets MISC, Use as directed up to 4 times daily., Disp: 100 each, Rfl: 0   fluticasone (FLONASE) 50 MCG/ACT nasal spray, Place 2 sprays into both nostrils daily., Disp: 16 g, Rfl: 6   furosemide (LASIX) 40 MG tablet, Take 1 tablet (40 mg total) by mouth daily., Disp: 80 tablet, Rfl: 0   gabapentin (NEURONTIN) 300 MG capsule, Take 2 capsules in the morning AND 1 capsule midday AND 2 capsules every evening., Disp: 150 capsule, Rfl: 0   glucose blood test strip, Use four times daily as directed., Disp: 100 each, Rfl: 0   hydrOXYzine (ATARAX) 25 MG tablet, Take 1 tablet (25 mg total) by mouth 3 (three) times daily as needed., Disp: 90 tablet, Rfl: 2   insulin aspart protamine - aspart (NOVOLOG 70/30 MIX) (70-30) 100 UNIT/ML FlexPen, Inject 45 Units into the skin 2 (two) times daily with a meal., Disp: 15 mL, Rfl: 1   Insulin Pen Needle (UNIFINE PENTIPS) 31G X 5 MM MISC, use with insulin pen, Disp: 100 each, Rfl: 1   linagliptin (TRADJENTA) 5 MG TABS tablet, Take 1 tablet (5 mg total) by mouth daily., Disp: 30 tablet, Rfl: 0   metFORMIN (GLUCOPHAGE) 500 MG tablet, Take 2 tablets (1,000 mg total) by mouth 2 (two) times daily with a meal., Disp: 120 tablet, Rfl: 0   Multiple Vitamins-Minerals (PRESERVISION AREDS 2) CAPS, Take 1  capsule by mouth 2 (two) times daily with food. Take 1 in the morning and 1 in the evening. (Patient not taking: Reported on 12/09/2021), Disp: 120 capsule, Rfl: 6   olopatadine (PATANOL) 0.1 % ophthalmic solution, Place 1 drop into the right eye 2 (two) times daily. (Patient not taking: Reported on 12/09/2021), Disp: 5 mL, Rfl: 12   polyethylene glycol powder (GLYCOLAX/MIRALAX) 17 GM/SCOOP powder, Take 17 g by mouth daily as needed for moderate constipation or severe constipation. (Patient not taking: Reported on 12/09/2021), Disp: 238 g, Rfl: 0   risperidone (RISPERDAL) 4 MG tablet, Take 1 tablet (4 mg total) by mouth once daily., Disp: 30 tablet, Rfl: 0   tamsulosin (FLOMAX) 0.4 MG CAPS capsule,  Take 1 capsule (0.4 mg total) by mouth daily., Disp: 30 capsule, Rfl: 3 No Known Allergies   Social History   Socioeconomic History   Marital status: Single    Spouse name: Not on file   Number of children: Not on file   Years of education: Not on file   Highest education level: Not on file  Occupational History   Not on file  Tobacco Use   Smoking status: Every Day    Packs/day: 1.00    Types: Cigarettes   Smokeless tobacco: Never  Vaping Use   Vaping Use: Never used  Substance and Sexual Activity   Alcohol use: Not Currently   Drug use: Never   Sexual activity: Not on file  Other Topics Concern   Not on file  Social History Narrative   Not on file   Social Determinants of Health   Financial Resource Strain: Not on file  Food Insecurity: Not on file  Transportation Needs: Not on file  Physical Activity: Not on file  Stress: Not on file  Social Connections: Not on file  Intimate Partner Violence: Not on file    Physical Exam      Future Appointments  Date Time Provider Emma  02/03/2022 10:10 AM [name], MD [facility] None  03/14/2022  3:15 PM Gardiner Barefoot, DPM [facility]     ACTION: Home visit completed

## 2022-01-13 ENCOUNTER — Other Ambulatory Visit: Payer: Self-pay

## 2022-01-13 ENCOUNTER — Telehealth: Payer: Self-pay | Admitting: Critical Care Medicine

## 2022-01-13 DIAGNOSIS — K8681 Exocrine pancreatic insufficiency: Secondary | ICD-10-CM

## 2022-01-13 DIAGNOSIS — K529 Noninfective gastroenteritis and colitis, unspecified: Secondary | ICD-10-CM

## 2022-01-13 MED ORDER — METRONIDAZOLE 500 MG PO TABS
500.0000 mg | ORAL_TABLET | Freq: Three times a day (TID) | ORAL | 0 refills | Status: DC
  Filled 2022-01-13: qty 42, 14d supply, fill #0

## 2022-01-13 MED ORDER — DIPHENOXYLATE-ATROPINE 2.5-0.025 MG PO TABS
1.0000 | ORAL_TABLET | Freq: Four times a day (QID) | ORAL | 0 refills | Status: DC | PRN
  Filled 2022-01-13: qty 30, 8d supply, fill #0

## 2022-01-13 NOTE — Telephone Encounter (Signed)
Call placed to patient , he answered. I tried to visit him yesterday, noone home  He is having diarrhea   I plan:  GI consult will need transportation,   Rx lomotil and Flagyl    sent to our pharmacy: heather can you pick up tomorrow or Monday and take to him??  We are getting him a lift chair, more clothes , more shoes, more sheets  He knows he needs to see me next OV in january

## 2022-01-14 ENCOUNTER — Other Ambulatory Visit: Payer: Self-pay

## 2022-01-14 MED ORDER — DIPHENOXYLATE-ATROPINE 2.5-0.025 MG PO TABS: 1.0000 | ORAL_TABLET | Freq: Four times a day (QID) | ORAL | 0 refills | Status: DC | PRN

## 2022-01-14 MED ORDER — METRONIDAZOLE 500 MG PO TABS: 500.0000 mg | ORAL_TABLET | Freq: Three times a day (TID) | ORAL | 0 refills | Status: AC

## 2022-01-14 NOTE — Telephone Encounter (Signed)
[facility]

## 2022-01-14 NOTE — Addendum Note (Signed)
Addended by: [name] on: 01/14/2022 07:22 AM   Modules accepted: Orders

## 2022-01-20 ENCOUNTER — Telehealth (HOSPITAL_COMMUNITY): Payer: Self-pay

## 2022-01-20 NOTE — Telephone Encounter (Signed)
Spoke to [employer] who received his medications from [employer] and we discussed how and when to take same. He is aware. I also talked with him about his other medications which he says he is taking and reports he has been checking his sugar and using his insulin. We talked about our home visit for next week and he is in agreement with same. Call complete.   [name], [place] 01/20/2022

## 2022-01-26 ENCOUNTER — Other Ambulatory Visit (HOSPITAL_COMMUNITY): Payer: Self-pay

## 2022-01-26 NOTE — Progress Notes (Signed)
Paramedicine Encounter    Patient ID: Raymond Hood, male    DOB: [birth year], 52 y.o.   MRN: [id number]   Arrived for home visit for Raymond Hood who was outside sitting on a bench smoking. He reports feeling okay just in some general pain with some shortness of breath.   We walked inside. Noted some shortness of breath while ambulating.   Inside the apartment I noted it still being unkept. He appeared as if he had showered, he did have a clean shirt on, but wearing the same sweatpants with fecal stains on them. He reports the sweatpants he was given fit but he isn't able to keep them up due to them not having elastic waistband so they fall down despite him trying to tie them.   He was 100% compliant with his medications over the last two weeks.   I brought him some hygiene supplies and some non perishable food items. I also helped him order groceries on [employer] using his EBT card.    Lungs noted some wheezing, he also has a productive cough. He reports he is using his inhalers daily.   Legs swollen, he reports urinating often. He reports improvement with bowel movements since using lomotil.   Denied chest pain or dizziness.   I reviewed meds and confirmed same filling pill box for two weeks.  Refills as noted: -Gabapentin -Metformin -Flomax -Lasix -Rispieridone -Famotidine -Atorvastatin   He wants bubble packs at [place] now that he has [facility]. I will help set this up.  I reminded him of his appointment for next week with [name]. I also provided him the number to call for [facility] transportation. He was given info about appointment to set this up.   We reviewed care goals and diagnosis management.   I am going to inquire about meals on wheels for Raymond Hood as he lacks transportation to get groceries. Last night for dinner he reports eating green beans, I saw a can of green beans empty with a fork in the can noting he ate canned beans for dinner. In his  grocery order we tried to get some low sodium meals, however we also had to order cost effective meals due to his limited income and EBT amount.   He knows to reach out if needed- he denied any other needs at this time.   Home visit complete. I plan to see him in one week at Dr. Ileana Roup visit in the office.   [name], [place] 01/26/2022     Patient Care Team: [name], MD as PCP - General (Pulmonary Disease)  Patient Active Problem List   Diagnosis Date Noted   AKI (acute kidney injury) ([place]) 07/27/2021   Anxiety and depression 07/14/2021   Essential hypertension 07/14/2021   Chronic diastolic heart failure ([place]) 01/14/2021   Type 2 diabetes mellitus with hyperglycemia, with long-term current use of insulin ([place]) 12/14/2020   GERD (gastroesophageal reflux disease) 10/15/2020   Tobacco abuse 10/15/2020   Hypertension associated with diabetes ([place]) 10/15/2020   Renal mass, right 10/15/2020   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   Morbid obesity with BMI of 50.0-59.9, adult ([place]) 10/15/2020   Homeless 10/15/2020   MDD (major depressive disorder), recurrent episode ([place]) 09/28/2020   Anxiety 09/28/2020   Schizo affective schizophrenia ([place]) 09/28/2020   Numbness and tingling in both hands 05/28/2020   Adrenal hyperplasia ([place]) 06/05/2019   Low back pain 08/29/2018   Multiple joint pain 08/29/2018   OSA/OHS 08/17/2018  Exocrine pancreatic insufficiency 07/02/2018   Asthma without status asthmaticus 03/27/2018   Epidermal inclusion cyst 07/20/2012   Psoriasis, unspecified 06/28/2012   Erectile dysfunction 06/28/2012   ADHD 11/17/1981    Current Outpatient Medications:    albuterol (VENTOLIN HFA) 108 (90 Base) MCG/ACT inhaler, Inhale 2 puffs into the lungs every 6  hours as needed for wheezing or shortness of breath., Disp: 6.7 g, Rfl: 2   atorvastatin (LIPITOR) 10 MG tablet, Take 1 tablet (10 mg total) by mouth once daily., Disp: 30  tablet, Rfl: 3   Blood Glucose Monitoring Suppl (ACCU-CHEK GUIDE) w/Device KIT, Use up to four times daily as directed., Disp: 1 kit, Rfl: 0   budesonide-formoterol (SYMBICORT) 160-4.5 MCG/ACT inhaler, Inhale 2 puffs into the lungs 2 (two) times daily., Disp: 10.2 g, Rfl: 12   dapagliflozin propanediol (FARXIGA) 10 MG TABS tablet, Take 1 tablet (10 mg total) by mouth daily., Disp: 30 tablet, Rfl: 3   diphenoxylate-atropine (LOMOTIL) 2.5-0.025 MG tablet, Take 1 tablet by mouth 4 (four) times daily as needed for diarrhea or loose stools., Disp: 30 tablet, Rfl: 0   DULoxetine (CYMBALTA) 60 MG capsule, Take 1 capsule (60 mg total) by mouth daily., Disp: 30 capsule, Rfl: 3   famotidine (PEPCID) 20 MG tablet, Take 1 tablet (20 mg total) by mouth 2 (two) times daily., Disp: 60 tablet, Rfl: 0   Fingerstix Lancets MISC, Use as directed up to 4 times daily., Disp: 100 each, Rfl: 0   fluticasone (FLONASE) 50 MCG/ACT nasal spray, Place 2 sprays into both nostrils daily., Disp: 16 g, Rfl: 6   furosemide (LASIX) 40 MG tablet, Take 1 tablet (40 mg total) by mouth daily., Disp: 80 tablet, Rfl: 0   gabapentin (NEURONTIN) 300 MG capsule, Take 2 capsules in the morning AND 1 capsule midday AND 2 capsules every evening., Disp: 150 capsule, Rfl: 0   glucose blood test strip, Use four times daily as directed., Disp: 100 each, Rfl: 0   hydrOXYzine (ATARAX) 25 MG tablet, Take 1 tablet (25 mg total) by mouth 3 (three) times daily as needed., Disp: 90 tablet, Rfl: 2   insulin aspart protamine - aspart (NOVOLOG 70/30 MIX) (70-30) 100 UNIT/ML FlexPen, Inject 45 Units into the skin 2 (two) times daily with a meal., Disp: 15 mL, Rfl: 1   Insulin Pen Needle (UNIFINE PENTIPS) 31G X 5 MM MISC, use with insulin pen, Disp: 100 each, Rfl: 1   linagliptin (TRADJENTA) 5 MG TABS tablet, Take 1 tablet (5 mg total) by mouth daily., Disp: 30 tablet, Rfl: 0   metFORMIN (GLUCOPHAGE) 500 MG tablet, Take 2 tablets (1,000 mg total) by mouth 2  (two) times daily with a meal., Disp: 120 tablet, Rfl: 0   metroNIDAZOLE (FLAGYL) 500 MG tablet, Take 1 tablet (500 mg total) by mouth 3 (three) times daily for 14 days., Disp: 42 tablet, Rfl: 0   Multiple Vitamins-Minerals (PRESERVISION AREDS 2) CAPS, Take 1 capsule by mouth 2 (two) times daily with food. Take 1 in the morning and 1 in the evening., Disp: 120 capsule, Rfl: 6   olopatadine (PATANOL) 0.1 % ophthalmic solution, Place 1 drop into the right eye 2 (two) times daily. (Patient not taking: Reported on 12/09/2021), Disp: 5 mL, Rfl: 12   polyethylene glycol powder (GLYCOLAX/MIRALAX) 17 GM/SCOOP powder, Take 17 g by mouth daily as needed for moderate constipation or severe constipation. (Patient not taking: Reported on 01/11/2022), Disp: 238 g, Rfl: 0   risperidone (RISPERDAL) 4 MG tablet, Take  1 tablet (4 mg total) by mouth once daily., Disp: 30 tablet, Rfl: 0   tamsulosin (FLOMAX) 0.4 MG CAPS capsule, Take 1 capsule (0.4 mg total) by mouth daily., Disp: 30 capsule, Rfl: 3 No Known Allergies   Social History   Socioeconomic History   Marital status: Single    Spouse name: Not on file   Number of children: Not on file   Years of education: Not on file   Highest education level: Not on file  Occupational History   Not on file  Tobacco Use   Smoking status: Every Day    Packs/day: 1.00    Types: Cigarettes   Smokeless tobacco: Never  Vaping Use   Vaping Use: Never used  Substance and Sexual Activity   Alcohol use: Not Currently   Drug use: Never   Sexual activity: Not on file  Other Topics Concern   Not on file  Social History Narrative   Not on file   Social Determinants of Health   Financial Resource Strain: Not on file  Food Insecurity: Not on file  Transportation Needs: Not on file  Physical Activity: Not on file  Stress: Not on file  Social Connections: Not on file  Intimate Partner Violence: Not on file    Physical Exam      Future Appointments  Date Time  Provider Department Center  02/03/2022 10:10 AM Storm Frisk, MD [facility] None  03/14/2022  3:15 PM Helane Gunther, DPM [facility]     ACTION: Home visit completed

## 2022-01-27 ENCOUNTER — Telehealth (HOSPITAL_COMMUNITY): Payer: Self-pay

## 2022-01-27 MED ORDER — RISPERIDONE 4 MG PO TABS: 4.0000 mg | ORAL_TABLET | Freq: Every day | ORAL | 0 refills | Status: DC

## 2022-01-27 MED ORDER — ATORVASTATIN CALCIUM 10 MG PO TABS: 10.0000 mg | ORAL_TABLET | Freq: Every day | ORAL | 0 refills | Status: DC

## 2022-01-27 MED ORDER — DULOXETINE HCL 60 MG PO CPEP: 60.0000 mg | ORAL_CAPSULE | Freq: Every day | ORAL | 0 refills | Status: DC

## 2022-01-27 MED ORDER — BUDESONIDE-FORMOTEROL FUMARATE 160-4.5 MCG/ACT IN AERO: 2.0000 | INHALATION_SPRAY | Freq: Two times a day (BID) | RESPIRATORY_TRACT | 0 refills | Status: DC

## 2022-01-27 MED ORDER — FUROSEMIDE 40 MG PO TABS: 40.0000 mg | ORAL_TABLET | Freq: Every day | ORAL | 0 refills | Status: DC

## 2022-01-27 MED ORDER — INSULIN ASPART PROT & ASPART (70-30 MIX) 100 UNIT/ML PEN: 45.0000 [IU] | PEN_INJECTOR | Freq: Two times a day (BID) | SUBCUTANEOUS | 0 refills | Status: DC

## 2022-01-27 MED ORDER — HYDROXYZINE HCL 25 MG PO TABS: 25.0000 mg | ORAL_TABLET | Freq: Three times a day (TID) | ORAL | 0 refills | Status: DC | PRN

## 2022-01-27 MED ORDER — TAMSULOSIN HCL 0.4 MG PO CAPS: 0.4000 mg | ORAL_CAPSULE | Freq: Every day | ORAL | 0 refills | Status: DC

## 2022-01-27 MED ORDER — ALBUTEROL SULFATE HFA 108 (90 BASE) MCG/ACT IN AERS: 2.0000 | INHALATION_SPRAY | Freq: Four times a day (QID) | RESPIRATORY_TRACT | 0 refills | Status: DC | PRN

## 2022-01-27 MED ORDER — UNIFINE PENTIPS 31G X 5 MM MISC: 1 refills | Status: DC

## 2022-01-27 MED ORDER — ACCU-CHEK GUIDE W/DEVICE KIT: PACK | 0 refills | Status: DC

## 2022-01-27 MED ORDER — LINAGLIPTIN 5 MG PO TABS: 5.0000 mg | ORAL_TABLET | Freq: Every day | ORAL | 0 refills | Status: DC

## 2022-01-27 MED ORDER — GLUCOSE BLOOD VI STRP: ORAL_STRIP | 0 refills | Status: DC

## 2022-01-27 MED ORDER — METFORMIN HCL 500 MG PO TABS: 1000.0000 mg | ORAL_TABLET | Freq: Two times a day (BID) | ORAL | 0 refills | Status: DC

## 2022-01-27 MED ORDER — FLUTICASONE PROPIONATE 50 MCG/ACT NA SUSP: 2.0000 | Freq: Every day | NASAL | 0 refills | Status: DC

## 2022-01-27 MED ORDER — DAPAGLIFLOZIN PROPANEDIOL 10 MG PO TABS: 10.0000 mg | ORAL_TABLET | Freq: Every day | ORAL | 0 refills | Status: DC

## 2022-01-27 NOTE — Telephone Encounter (Signed)
Rxs sent to [facility]!

## 2022-01-27 NOTE — Addendum Note (Signed)
Addended by: Daisy Blossom, Annie Main L on: 01/27/2022 03:53 PM   Modules accepted: Orders

## 2022-01-27 NOTE — Telephone Encounter (Signed)
Raymond Hood and I discussed moving medications to [facility] since he recently was approved for [facility]. This will help with improvement of accessing medication as they will deliver to his home and waive copays.   Please send all meds and supplies to [facility] and I will coordinate with pharmacist for patient delivery.   Call routed to [name] and Acquanetta Belling Ausdall RPH-CPP.   Updated [employer] Card Info: MEMBER ID: [id number] Plan Name: [facility] Effective Date: 12/10/2021 Medicaid #[id number] Raymond Hood, [place] 01/27/2022

## 2022-02-01 ENCOUNTER — Other Ambulatory Visit: Payer: Self-pay | Admitting: Critical Care Medicine

## 2022-02-01 MED ORDER — GABAPENTIN 300 MG PO CAPS: ORAL_CAPSULE | ORAL | 0 refills | Status: DC

## 2022-02-01 MED ORDER — PRESERVISION AREDS 2 PO CAPS: 1.0000 | ORAL_CAPSULE | Freq: Two times a day (BID) | ORAL | 6 refills | Status: DC

## 2022-02-01 MED ORDER — FINGERSTIX LANCETS MISC: 0 refills | Status: DC

## 2022-02-01 MED ORDER — FAMOTIDINE 20 MG PO TABS: 20.0000 mg | ORAL_TABLET | Freq: Two times a day (BID) | ORAL | 0 refills | Status: DC

## 2022-02-01 MED ORDER — GLUCOSE BLOOD VI STRP: ORAL_STRIP | 0 refills | Status: DC

## 2022-02-01 NOTE — Progress Notes (Signed)
The following meds are needing to be sent to [facility] so they can arrange his bubble packs.   -Preservision Multivitamin  -Gabapentin  -Famotidine    Accucheck Diabetic Supplies  -lancets  -test strips

## 2022-02-03 ENCOUNTER — Encounter: Payer: Self-pay | Admitting: Critical Care Medicine

## 2022-02-03 ENCOUNTER — Ambulatory Visit: Payer: Medicaid Other | Attending: Critical Care Medicine | Admitting: Critical Care Medicine

## 2022-02-03 ENCOUNTER — Other Ambulatory Visit: Payer: Self-pay

## 2022-02-03 VITALS — BP 136/92 | Wt 386.0 lb

## 2022-02-03 DIAGNOSIS — E1159 Type 2 diabetes mellitus with other circulatory complications: Secondary | ICD-10-CM

## 2022-02-03 DIAGNOSIS — G4733 Obstructive sleep apnea (adult) (pediatric): Secondary | ICD-10-CM | POA: Insufficient documentation

## 2022-02-03 DIAGNOSIS — F17210 Nicotine dependence, cigarettes, uncomplicated: Secondary | ICD-10-CM | POA: Insufficient documentation

## 2022-02-03 DIAGNOSIS — E1165 Type 2 diabetes mellitus with hyperglycemia: Secondary | ICD-10-CM | POA: Diagnosis not present

## 2022-02-03 DIAGNOSIS — K8689 Other specified diseases of pancreas: Secondary | ICD-10-CM

## 2022-02-03 DIAGNOSIS — E278 Other specified disorders of adrenal gland: Secondary | ICD-10-CM

## 2022-02-03 DIAGNOSIS — Z794 Long term (current) use of insulin: Secondary | ICD-10-CM | POA: Diagnosis not present

## 2022-02-03 DIAGNOSIS — J4489 Other specified chronic obstructive pulmonary disease: Secondary | ICD-10-CM | POA: Diagnosis not present

## 2022-02-03 DIAGNOSIS — Z720 Tobacco use: Secondary | ICD-10-CM

## 2022-02-03 DIAGNOSIS — E119 Type 2 diabetes mellitus without complications: Secondary | ICD-10-CM | POA: Insufficient documentation

## 2022-02-03 DIAGNOSIS — J42 Unspecified chronic bronchitis: Secondary | ICD-10-CM | POA: Insufficient documentation

## 2022-02-03 DIAGNOSIS — I5032 Chronic diastolic (congestive) heart failure: Secondary | ICD-10-CM

## 2022-02-03 DIAGNOSIS — K8681 Exocrine pancreatic insufficiency: Secondary | ICD-10-CM

## 2022-02-03 DIAGNOSIS — I10 Essential (primary) hypertension: Secondary | ICD-10-CM | POA: Insufficient documentation

## 2022-02-03 DIAGNOSIS — Z716 Tobacco abuse counseling: Secondary | ICD-10-CM | POA: Insufficient documentation

## 2022-02-03 DIAGNOSIS — Z5901 Sheltered homelessness: Secondary | ICD-10-CM

## 2022-02-03 DIAGNOSIS — F259 Schizoaffective disorder, unspecified: Secondary | ICD-10-CM | POA: Insufficient documentation

## 2022-02-03 DIAGNOSIS — N179 Acute kidney failure, unspecified: Secondary | ICD-10-CM

## 2022-02-03 DIAGNOSIS — Z6841 Body Mass Index (BMI) 40.0 and over, adult: Secondary | ICD-10-CM

## 2022-02-03 DIAGNOSIS — I152 Hypertension secondary to endocrine disorders: Secondary | ICD-10-CM

## 2022-02-03 DIAGNOSIS — E662 Morbid (severe) obesity with alveolar hypoventilation: Secondary | ICD-10-CM

## 2022-02-03 LAB — POCT GLYCOSYLATED HEMOGLOBIN (HGB A1C): Hemoglobin A1C: 8.4 % — AB (ref 4.0–5.6)

## 2022-02-03 LAB — GLUCOSE, POCT (MANUAL RESULT ENTRY): POC Glucose: 167 mg/dl — AB (ref 70–99)

## 2022-02-03 MED ORDER — GABAPENTIN 300 MG PO CAPS: ORAL_CAPSULE | ORAL | 1 refills | Status: DC

## 2022-02-03 MED ORDER — RISPERIDONE 4 MG PO TABS: 4.0000 mg | ORAL_TABLET | Freq: Every day | ORAL | 2 refills | Status: DC

## 2022-02-03 MED ORDER — BUDESONIDE-FORMOTEROL FUMARATE 160-4.5 MCG/ACT IN AERO: 2.0000 | INHALATION_SPRAY | Freq: Two times a day (BID) | RESPIRATORY_TRACT | 0 refills | Status: DC

## 2022-02-03 MED ORDER — LINAGLIPTIN 5 MG PO TABS: 5.0000 mg | ORAL_TABLET | Freq: Every day | ORAL | 2 refills | Status: DC

## 2022-02-03 MED ORDER — HYDROXYZINE HCL 25 MG PO TABS: 25.0000 mg | ORAL_TABLET | Freq: Three times a day (TID) | ORAL | 1 refills | Status: DC | PRN

## 2022-02-03 MED ORDER — TAMSULOSIN HCL 0.4 MG PO CAPS: 0.4000 mg | ORAL_CAPSULE | Freq: Every day | ORAL | 2 refills | Status: DC

## 2022-02-03 MED ORDER — DULOXETINE HCL 60 MG PO CPEP: 60.0000 mg | ORAL_CAPSULE | Freq: Every day | ORAL | 2 refills | Status: DC

## 2022-02-03 MED ORDER — METFORMIN HCL 500 MG PO TABS: 1000.0000 mg | ORAL_TABLET | Freq: Two times a day (BID) | ORAL | 2 refills | Status: DC

## 2022-02-03 MED ORDER — PANCRELIPASE (LIP-PROT-AMYL) 12000-38000 UNITS PO CPEP: 12000.0000 [IU] | ORAL_CAPSULE | Freq: Three times a day (TID) | ORAL | 1 refills | Status: AC

## 2022-02-03 MED ORDER — DAPAGLIFLOZIN PROPANEDIOL 10 MG PO TABS: 10.0000 mg | ORAL_TABLET | Freq: Every day | ORAL | 2 refills | Status: DC

## 2022-02-03 MED ORDER — INSULIN ASPART PROT & ASPART (70-30 MIX) 100 UNIT/ML PEN: 45.0000 [IU] | PEN_INJECTOR | Freq: Two times a day (BID) | SUBCUTANEOUS | 2 refills | Status: DC

## 2022-02-03 MED ORDER — FUROSEMIDE 40 MG PO TABS: 40.0000 mg | ORAL_TABLET | Freq: Every day | ORAL | 2 refills | Status: DC

## 2022-02-03 NOTE — Assessment & Plan Note (Signed)
Continue with mental health medication

## 2022-02-03 NOTE — Assessment & Plan Note (Signed)
Continue with furosemide

## 2022-02-03 NOTE — Assessment & Plan Note (Signed)
Continue with CPAP therapy  

## 2022-02-03 NOTE — Assessment & Plan Note (Signed)
Will resume Creon he has elevated elastase in the stool this is likely because of diarrhea

## 2022-02-03 NOTE — Assessment & Plan Note (Signed)
Continue with CPAP
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
## 2022-02-03 NOTE — Progress Notes (Signed)
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 guarding or rebound.  Musculoskeletal:        General: Normal range of motion.     Cervical back: Normal range of motion and neck supple. No edema, erythema or rigidity. No muscular tenderness. Normal range of motion.     Right lower leg: Edema present.     Left lower leg: Edema present.     Comments: Edema in both lower extremities markedly improved  Lymphadenopathy:     Head:     Right side of head: No submental or submandibular adenopathy.     Left side of head: No submental or submandibular adenopathy.     Cervical: No cervical adenopathy.  Skin:    General: Skin is warm and dry.     Coloration: Skin is not pale.     Findings: No rash.     Nails: There is no clubbing.  Neurological:     Mental Status: He is alert and oriented to person, place, and time.     Sensory: No sensory deficit.  Psychiatric:        Attention and Perception: Attention normal. He does not perceive  auditory hallucinations.        Mood and Affect: Mood is depressed.        Speech: Speech normal. Speech is not delayed.        Behavior: Behavior normal. Behavior is not slowed or withdrawn. Behavior is cooperative.        Thought Content: Thought content normal. Thought content is not paranoid or delusional. Thought content does not include homicidal or suicidal ideation. Thought content does not include homicidal or suicidal plan.        Cognition and Memory: Cognition is impaired. He exhibits impaired recent memory.        Judgment: Judgment is not impulsive or inappropriate.     There were no vitals taken for this visit. Wt Readings from Last 3 Encounters:  10/27/21 (!) 379 lb 9.6 oz (172.2 kg)  10/20/21 (!) 383 lb 6.4 oz (173.9 kg)  10/15/21 (!) 374 lb 8 oz (169.9 kg)     Health Maintenance Due  Topic Date Due   COVID-19 Vaccine (3 - 2023-24 season) 09/10/2021   Diabetic kidney evaluation - Urine ACR  01/25/2022    There are no preventive care reminders to display for this patient.  Lab Results  Component Value Date   TSH 1.888 07/27/2021   Lab Results  Component Value Date   WBC 11.7 (H) 08/09/2021   HGB 16.1 08/09/2021   HCT 52.5 (H) 08/09/2021   MCV 83 08/09/2021   PLT 228 08/09/2021   Lab Results  Component Value Date   NA 142 08/09/2021   K 4.0 08/09/2021   CO2 27 08/09/2021   GLUCOSE 147 (H) 08/09/2021   BUN 14 08/09/2021   CREATININE 0.86 08/09/2021  BILITOT 0.2 08/09/2021   ALKPHOS 98 08/09/2021   AST 11 08/09/2021   ALT 18 08/09/2021   PROT 6.4 08/09/2021   ALBUMIN 3.8 (L) 08/09/2021   CALCIUM 9.0 08/09/2021   ANIONGAP 7 07/30/2021   EGFR 107 08/09/2021   Lab Results  Component Value Date   CHOL 186 01/25/2021   Lab Results  Component Value Date   HDL 42 01/25/2021   Lab Results  Component Value Date   LDLCALC 99 01/25/2021   Lab Results  Component Value Date   TRIG 268 (H) 01/25/2021   Lab Results  Component Value Date    CHOLHDL 4.4 01/25/2021   Lab Results  Component Value Date   HGBA1C 8.4 (A) 02/03/2022      Assessment & Plan:   Problem List Items Addressed This Visit       Cardiovascular and Mediastinum   Hypertension associated with diabetes (HCC)   Relevant Medications   dapagliflozin propanediol (FARXIGA) 10 MG TABS tablet   furosemide (LASIX) 40 MG tablet   insulin aspart protamine - aspart (NOVOLOG 70/30 MIX) (70-30) 100 UNIT/ML FlexPen   linagliptin (TRADJENTA) 5 MG TABS tablet   metFORMIN (GLUCOPHAGE) 500 MG tablet   Other Relevant Orders   CBC with Differential/Platelet   Chronic diastolic heart failure (HCC)    Continue with furosemide      Relevant Medications   furosemide (LASIX) 40 MG tablet   Essential hypertension    Hypertension currently under reasonable control we will continue with observation as his blood pressure is not being treated currently      Relevant Medications   furosemide (LASIX) 40 MG tablet     Respiratory   COPD with chronic bronchitis    Not having a current flare continue inhaled medications      Relevant Medications   budesonide-formoterol (SYMBICORT) 160-4.5 MCG/ACT inhaler   OSA/OHS    Continue with CPAP therapy        Digestive   Exocrine pancreatic insufficiency    Will resume Creon he has elevated elastase in the stool this is likely because of diarrhea        Endocrine   Type 2 diabetes mellitus with hyperglycemia, with long-term current use of insulin (HCC)    Continue current insulin program and oral agents program      Relevant Medications   dapagliflozin propanediol (FARXIGA) 10 MG TABS tablet   insulin aspart protamine - aspart (NOVOLOG 70/30 MIX) (70-30) 100 UNIT/ML FlexPen   linagliptin (TRADJENTA) 5 MG TABS tablet   metFORMIN (GLUCOPHAGE) 500 MG tablet   Adrenal hyperplasia (HCC)    Monitor        Genitourinary   RESOLVED: AKI (acute kidney injury) (HCC)    Resolved        Other   Schizo affective  schizophrenia (HCC)    Continue with mental health medication      Tobacco abuse       Current smoking consumption amount: 1PPD or more  Dicsussion on advise to quit smoking and smoking impacts: CV lung impacts  Patient's willingness to quit:  Not ready to quit completely  Methods to quit smoking discussed:  Behavioral mod and nicotine replacemtn  No prescribed  Medication management of smoking session drugs discussed: no meds prescribed  Setting quit date not established   Follow-up arranged  Weekly at shelter clinic   Time spent counseling the patient:         Morbid obesity with BMI of 50.0-59.9, adult (  [place])    Continue with CPAP      Relevant Medications   dapagliflozin propanediol (FARXIGA) 10 MG TABS tablet   insulin aspart protamine - aspart (NOVOLOG 70/30 MIX) (70-30) 100 UNIT/ML FlexPen   linagliptin (TRADJENTA) 5 MG TABS tablet   metFORMIN (GLUCOPHAGE) 500 MG tablet   Sheltered homelessness    Was living in shelter now has an apartment on his own but needs lots of support to stay there      Other Visit Diagnoses     Type 2 diabetes mellitus with hyperglycemia, without long-term current use of insulin (HCC)    -  Primary   Relevant Medications   dapagliflozin propanediol (FARXIGA) 10 MG TABS tablet   insulin aspart protamine - aspart (NOVOLOG 70/30 MIX) (70-30) 100 UNIT/ML FlexPen   linagliptin (TRADJENTA) 5 MG TABS tablet   metFORMIN (GLUCOPHAGE) 500 MG tablet   Other Relevant Orders   Urine microalbumin-creatinine with uACR   Comprehensive metabolic panel with eGFR (no eGFR if sent to [facility])   POCT glycosylated hemoglobin (Hb A1C) (Completed)   POCT glucose (manual entry) (Completed)   Lipid panel   Pancreatic insufficiency       Relevant Orders   Lipase      Meds ordered this encounter  Medications   budesonide-formoterol (SYMBICORT) 160-4.5 MCG/ACT inhaler    Sig: Inhale 2 puffs into the lungs 2 (two) times daily.    Dispense:  10.2  g    Refill:  0   dapagliflozin propanediol (FARXIGA) 10 MG TABS tablet    Sig: Take 1 tablet (10 mg total) by mouth daily.    Dispense:  60 tablet    Refill:  2   DULoxetine (CYMBALTA) 60 MG capsule    Sig: Take 1 capsule (60 mg total) by mouth daily.    Dispense:  60 capsule    Refill:  2   furosemide (LASIX) 40 MG tablet    Sig: Take 1 tablet (40 mg total) by mouth daily.    Dispense:  60 tablet    Refill:  2   gabapentin (NEURONTIN) 300 MG capsule    Sig: Take 2 capsules in the morning AND 1 capsule midday AND 2 capsules every evening.    Dispense:  150 capsule    Refill:  1   hydrOXYzine (ATARAX) 25 MG tablet    Sig: Take 1 tablet (25 mg total) by mouth 3 (three) times daily as needed.    Dispense:  90 tablet    Refill:  1   insulin aspart protamine - aspart (NOVOLOG 70/30 MIX) (70-30) 100 UNIT/ML FlexPen    Sig: Inject 45 Units into the skin 2 (two) times daily with a meal.    Dispense:  15 mL    Refill:  2   linagliptin (TRADJENTA) 5 MG TABS tablet    Sig: Take 1 tablet (5 mg total) by mouth daily.    Dispense:  30 tablet    Refill:  2   metFORMIN (GLUCOPHAGE) 500 MG tablet    Sig: Take 2 tablets (1,000 mg total) by mouth 2 (two) times daily with a meal.    Dispense:  120 tablet    Refill:  2   risperidone (RISPERDAL) 4 MG tablet    Sig: Take 1 tablet (4 mg total) by mouth once daily.    Dispense:  60 tablet    Refill:  2   tamsulosin (FLOMAX) 0.4 MG CAPS capsule    Sig: Take 1 capsule (0.4  mg total) by mouth daily.    Dispense:  60 capsule    Refill:  2   lipase/protease/amylase (CREON) 12000-38000 units CPEP capsule    Sig: Take 1 capsule (12,000 Units total) by mouth 3 (three) times daily before meals.    Dispense:  270 capsule    Refill:  1  38 minutes spent complex decision making  Follow-up: Follow up with Dr. Delford FieldWright in 4 months.    Shan LevansPatrick Dagon Budai, MD

## 2022-02-03 NOTE — Assessment & Plan Note (Signed)
Not having a current flare continue inhaled medications

## 2022-02-03 NOTE — Assessment & Plan Note (Signed)
Hypertension currently under reasonable control we will continue with observation as his blood pressure is not being treated currently

## 2022-02-03 NOTE — Patient Instructions (Signed)
Medications have been refilled and you will receive bubble packs Start Creon 1 capsule before each meal 3 times a day  Complete screening labs obtained at this visit  Return to [name] 4 months

## 2022-02-03 NOTE — Assessment & Plan Note (Addendum)
Resolved

## 2022-02-03 NOTE — Assessment & Plan Note (Signed)
Monitor

## 2022-02-03 NOTE — Assessment & Plan Note (Signed)
    Current smoking consumption amount: 1PPD or more  Dicsussion on advise to quit smoking and smoking impacts: CV lung impacts  Patient's willingness to quit:  Not ready to quit completely  Methods to quit smoking discussed:  Behavioral mod and nicotine replacemtn  No prescribed  Medication management of smoking session drugs discussed: no meds prescribed  Setting quit date not established   Follow-up arranged  Weekly at shelter clinic   Time spent counseling the patient:  31min

## 2022-02-03 NOTE — Assessment & Plan Note (Signed)
Continue current insulin program and oral agents program

## 2022-02-04 LAB — COMPREHENSIVE METABOLIC PANEL
ALT: 9 IU/L (ref 0–44)
AST: 10 IU/L (ref 0–40)
Albumin/Globulin Ratio: 1.4 (ref 1.2–2.2)
Albumin: 3.9 g/dL — ABNORMAL LOW (ref 4.1–5.1)
Alkaline Phosphatase: 105 IU/L (ref 44–121)
BUN/Creatinine Ratio: 15 (ref 9–20)
BUN: 14 mg/dL (ref 6–24)
Bilirubin Total: 0.3 mg/dL (ref 0.0–1.2)
CO2: 25 mmol/L (ref 20–29)
Calcium: 8.7 mg/dL (ref 8.7–10.2)
Chloride: 98 mmol/L (ref 96–106)
Creatinine, Ser: 0.93 mg/dL (ref 0.76–1.27)
Globulin, Total: 2.8 g/dL (ref 1.5–4.5)
Glucose: 134 mg/dL — ABNORMAL HIGH (ref 70–99)
Potassium: 4.8 mmol/L (ref 3.5–5.2)
Sodium: 143 mmol/L (ref 134–144)
Total Protein: 6.7 g/dL (ref 6.0–8.5)
eGFR: 101 mL/min/{1.73_m2} (ref >59)

## 2022-02-04 LAB — CBC WITH DIFFERENTIAL/PLATELET
Basophils Absolute: 0.1 10*3/uL (ref 0.0–0.2)
Basos: 1 %
EOS (ABSOLUTE): 0.2 10*3/uL (ref 0.0–0.4)
Eos: 2 %
Hematocrit: 54.3 % — ABNORMAL HIGH (ref 37.5–51.0)
Hemoglobin: 16.8 g/dL (ref 13.0–17.7)
Immature Grans (Abs): 0.1 10*3/uL (ref 0.0–0.1)
Immature Granulocytes: 1 %
Lymphocytes Absolute: 2.2 10*3/uL (ref 0.7–3.1)
Lymphs: 18 %
MCH: 25.9 pg — ABNORMAL LOW (ref 26.6–33.0)
MCHC: 30.9 g/dL — ABNORMAL LOW (ref 31.5–35.7)
MCV: 84 fL (ref 79–97)
Monocytes Absolute: 0.9 10*3/uL (ref 0.1–0.9)
Monocytes: 7 %
Neutrophils Absolute: 9.2 10*3/uL — ABNORMAL HIGH (ref 1.4–7.0)
Neutrophils: 71 %
Platelets: 232 10*3/uL (ref 150–450)
RBC: 6.48 x10E6/uL — ABNORMAL HIGH (ref 4.14–5.80)
RDW: 17.2 % — ABNORMAL HIGH (ref 11.6–15.4)
WBC: 12.7 10*3/uL — ABNORMAL HIGH (ref 3.4–10.8)

## 2022-02-04 LAB — LIPID PANEL
Chol/HDL Ratio: 3.2 ratio (ref 0.0–5.0)
Cholesterol, Total: 120 mg/dL (ref 100–199)
HDL: 37 mg/dL — ABNORMAL LOW (ref >39)
LDL Chol Calc (NIH): 63 mg/dL (ref 0–99)
Triglycerides: 107 mg/dL (ref 0–149)
VLDL Cholesterol Cal: 20 mg/dL (ref 5–40)

## 2022-02-04 LAB — LIPASE: Lipase: 33 U/L (ref 13–78)

## 2022-02-05 LAB — MICROALBUMIN / CREATININE URINE RATIO
Creatinine, Urine: 127.2 mg/dL
Microalb/Creat Ratio: 8 mg/g creat (ref 0–29)
Microalbumin, Urine: 9.9 ug/mL

## 2022-02-17 ENCOUNTER — Telehealth (HOSPITAL_COMMUNITY): Payer: Medicaid Other

## 2022-02-17 NOTE — Telephone Encounter (Signed)
Thanks so much for your care heather  has his diarrhea checked up any?  Opal Sidles any ideas of healthy food access for this client?

## 2022-02-17 NOTE — Telephone Encounter (Signed)
Spoke to [employer] via his neighbors phone number [name]) [phone]  I advised him I applied for a free phone for him using [employer] as he could not qualify for the safety link phone. The application was approved now just pending shipment of phone. I made him aware. He reports needing food and PCS services to help with cleaning and getting food. I advised him I could bring by some food pantry items next week during our visit. He does not have access to transportation to get to a grocery store but does have food stamps.   I also created him an email address [email or web address]   He did advise he got his bubble packs and is using them with no issues. I will see Mel[place] next week. Call complete.   [name], [place] 02/17/2022

## 2022-02-22 ENCOUNTER — Telehealth (HOSPITAL_COMMUNITY): Payer: Self-pay
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
## 2022-02-24 ENCOUNTER — Other Ambulatory Visit: Payer: Self-pay | Admitting: Critical Care Medicine

## 2022-02-24 MED ORDER — NICOTINE POLACRILEX 4 MG MT LOZG: LOZENGE | OROMUCOSAL | 4 refills | Status: DC

## 2022-02-28 ENCOUNTER — Other Ambulatory Visit: Payer: Self-pay

## 2022-02-28 ENCOUNTER — Telehealth: Payer: Self-pay

## 2022-02-28 DIAGNOSIS — I5032 Chronic diastolic (congestive) heart failure: Secondary | ICD-10-CM

## 2022-02-28 NOTE — Progress Notes (Signed)
Referred to [employer] and Managed Medicaid Care Management

## 2022-02-28 NOTE — Telephone Encounter (Addendum)
I spoke to [name], EMT and informed her that I would place a referral to Managed Medicaid Care Management for assistance with accessing nutrition resources and his benefits with [facility]

## 2022-02-28 NOTE — Progress Notes (Signed)
Paramedicine Encounter    Patient ID: Raymond Hood, male    DOB: 05-08-1970, 52 y.o.   MRN: [id number]   Complaints- general body aches, shortness of breath while ambulating or exertion, limited energy, decreased mobility.   Assessment- seated outside on bench smoking a cigarette. Ambulatory inside his apartment. Apartment is unkept with smell of urine and feces. Dirt and leaves on the floor tracked in from outdoors. Bathroom has feces on floor, toilet and wall. Dishes unwashed on the table, bacon grease accumulated in glasses on the stove top, stuck on food left in the sink, cigarette butts all over counter tops, clothes unwashed, hair long and dry skin noted. He reports he has a hard time bathing himself due to his size. I provided him with a rope loofa but he says it isn't long enough. I also gave him finger nail clippers as his nails are very long and he said painful.   Lungs noted to have some slight wheezing with a non productive cough.   Legs swollen and red- afebrile.    Compliance with meds- yes no missed doses of meds in bubble packs and insulin appears to be being used as prescribed. (Several empty pens in the fridge he has over 6 pens left with medication)   Pill box filled- using bubble packs- has 3 doses in current and will begin final week on Thursday.   Refills needed- bubble packs   Meds changes since last visit- NONE     Social changes- PCS services called and have a scheduled appointment to come out on 2/26.   I provided him several food pantry items today. He has ham, sausage,eggs, cheese, coffee, bread in the home in addition to what I brought today.    BP 130/82   Pulse (!) 110   Resp 18   SpO2 92%  Weight yesterday-didn't weigh Last visit weight-386lbs   Arrived for home visit for Raymond Hood, assessment, vitals as noted above. Meds reviewed. Bubble packs confirmed. I reviewed diabetic management, he is checking his sugar daily sometimes twice daily according to  his glucometer. Today was 184 at 12:05.   He is taking meds as prescribed.   PCS comes out on 2/26 for home eval.   He has a hearing for disability in April.   I gave him his appointment for [facility] in March and wrote down details so he can call and set up a ride.   Raymond Hood denied any other needs at present. He struggles with transportation to grocery store and is unable to physically walk long distances. He has no income to use public transportation at present. Lowell Guitar at [facility] placed referral to managed medicaid for same.   Home visit complete. I will see Raymond Hood in 2-4 weeks. He agrees with plan.    [name], [place]  ACTION: Home visit completed    Patient Care Team: [name], MD as PCP - General (Pulmonary Disease)  Patient Active Problem List   Diagnosis Date Noted   Anxiety and depression 07/14/2021   Essential hypertension 07/14/2021   Chronic diastolic heart failure ([place]) 01/14/2021   Type 2 diabetes mellitus with hyperglycemia, with long-term current use of insulin ([place]) 12/14/2020   GERD (gastroesophageal reflux disease) 10/15/2020   Tobacco abuse 10/15/2020   Hypertension associated with diabetes ([place]) 10/15/2020   Renal mass, right 10/15/2020   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   Morbid obesity with BMI of 50.0-59.9, adult ([place]) 10/15/2020   Sheltered homelessness 10/15/2020  MDD (major depressive disorder), recurrent episode ([place]) 09/28/2020   Anxiety 09/28/2020   Schizo affective schizophrenia ([place]) 09/28/2020   Numbness and tingling in both hands 05/28/2020   Adrenal hyperplasia ([place]) 06/05/2019   Low back pain 08/29/2018   Multiple joint pain 08/29/2018   OSA/OHS 08/17/2018   Exocrine pancreatic insufficiency 07/02/2018   Asthma without status asthmaticus 03/27/2018   Epidermal inclusion cyst 07/20/2012   Psoriasis, unspecified 06/28/2012   Erectile dysfunction 06/28/2012   ADHD 11/17/1981    Current  Outpatient Medications:    albuterol (VENTOLIN HFA) 108 (90 Base) MCG/ACT inhaler, Inhale 2 puffs into the lungs every 6  hours as needed for wheezing or shortness of breath., Disp: 6.7 g, Rfl: 0   atorvastatin (LIPITOR) 10 MG tablet, Take 1 tablet (10 mg total) by mouth once daily., Disp: 30 tablet, Rfl: 0   Blood Glucose Monitoring Suppl (ACCU-CHEK GUIDE) w/Device KIT, Use up to four times daily as directed., Disp: 1 kit, Rfl: 0   budesonide-formoterol (SYMBICORT) 160-4.5 MCG/ACT inhaler, Inhale 2 puffs into the lungs 2 (two) times daily., Disp: 10.2 g, Rfl: 0   dapagliflozin propanediol (FARXIGA) 10 MG TABS tablet, Take 1 tablet (10 mg total) by mouth daily., Disp: 60 tablet, Rfl: 2   diphenoxylate-atropine (LOMOTIL) 2.5-0.025 MG tablet, Take 1 tablet by mouth 4 (four) times daily as needed for diarrhea or loose stools., Disp: 30 tablet, Rfl: 0   DULoxetine (CYMBALTA) 60 MG capsule, Take 1 capsule (60 mg total) by mouth daily., Disp: 60 capsule, Rfl: 2   famotidine (PEPCID) 20 MG tablet, Take 1 tablet (20 mg total) by mouth 2 (two) times daily., Disp: 60 tablet, Rfl: 0   Fingerstix Lancets MISC, Use as directed up to 4 times daily., Disp: 100 each, Rfl: 0   fluticasone (FLONASE) 50 MCG/ACT nasal spray, Place 2 sprays into both nostrils daily., Disp: 16 g, Rfl: 0   furosemide (LASIX) 40 MG tablet, Take 1 tablet (40 mg total) by mouth daily., Disp: 60 tablet, Rfl: 2   gabapentin (NEURONTIN) 300 MG capsule, Take 2 capsules in the morning AND 1 capsule midday AND 2 capsules every evening., Disp: 150 capsule, Rfl: 1   glucose blood test strip, Use four times daily as directed., Disp: 100 each, Rfl: 0   hydrOXYzine (ATARAX) 25 MG tablet, Take 1 tablet (25 mg total) by mouth 3 (three) times daily as needed., Disp: 90 tablet, Rfl: 1   insulin aspart protamine - aspart (NOVOLOG 70/30 MIX) (70-30) 100 UNIT/ML FlexPen, Inject 45 Units into the skin 2 (two) times daily with a meal., Disp: 15 mL, Rfl: 2    Insulin Pen Needle (UNIFINE PENTIPS) 31G X 5 MM MISC, use with insulin pen, Disp: 100 each, Rfl: 1   linagliptin (TRADJENTA) 5 MG TABS tablet, Take 1 tablet (5 mg total) by mouth daily., Disp: 30 tablet, Rfl: 2   lipase/protease/amylase (CREON) 12000-38000 units CPEP capsule, Take 1 capsule (12,000 Units total) by mouth 3 (three) times daily before meals., Disp: 270 capsule, Rfl: 1   metFORMIN (GLUCOPHAGE) 500 MG tablet, Take 2 tablets (1,000 mg total) by mouth 2 (two) times daily with a meal., Disp: 120 tablet, Rfl: 2   Multiple Vitamins-Minerals (PRESERVISION AREDS 2) CAPS, Take 1 capsule by mouth 2 (two) times daily with food. Take 1 in the morning and 1 in the evening., Disp: 120 capsule, Rfl: 6   nicotine polacrilex (NICORETTE MINI) 4 MG lozenge, Use 3 times a day to quit smoking, Disp: 100 tablet,  Rfl: 4   olopatadine (PATANOL) 0.1 % ophthalmic solution, Place 1 drop into the right eye 2 (two) times daily., Disp: 5 mL, Rfl: 12   polyethylene glycol powder (GLYCOLAX/MIRALAX) 17 GM/SCOOP powder, Take 17 g by mouth daily as needed for moderate constipation or severe constipation., Disp: 238 g, Rfl: 0   risperidone (RISPERDAL) 4 MG tablet, Take 1 tablet (4 mg total) by mouth once daily., Disp: 60 tablet, Rfl: 2   tamsulosin (FLOMAX) 0.4 MG CAPS capsule, Take 1 capsule (0.4 mg total) by mouth daily., Disp: 60 capsule, Rfl: 2 No Known Allergies   Social History   Socioeconomic History   Marital status: Single    Spouse name: Not on file   Number of children: Not on file   Years of education: Not on file   Highest education level: Not on file  Occupational History   Not on file  Tobacco Use   Smoking status: Every Day    Packs/day: 1.00    Types: Cigarettes   Smokeless tobacco: Never  Vaping Use   Vaping Use: Never used  Substance and Sexual Activity   Alcohol use: Not Currently   Drug use: Never   Sexual activity: Not on file  Other Topics Concern   Not on file  Social History  Narrative   Not on file   Social Determinants of Health   Financial Resource Strain: Not on file  Food Insecurity: Food Insecurity Present (02/28/2022)   Hunger Vital Sign    Worried About Running Out of Food in the Last Year: Often true    Ran Out of Food in the Last Year: Often true  Transportation Needs: Not on file  Physical Activity: Not on file  Stress: Not on file  Social Connections: Not on file  Intimate Partner Violence: Not on file    Physical Exam      Future Appointments  Date Time Provider [place]  03/14/2022  3:15 PM Gardiner Barefoot, DPM [facility]

## 2022-03-04 ENCOUNTER — Other Ambulatory Visit: Payer: Medicaid Other

## 2022-03-04 NOTE — Patient Outreach (Signed)
Medicaid Managed Care Social Work Note  03/04/2022 Name:  Raymond Hood MRN:  [id number] DOB:  December 14, 1970  Raymond Hood is an 52 y.o. year old male who is a primary patient of Raymond Hood, Raymond Harry, MD.  The Medicaid Managed Care Coordination team was consulted for assistance with:  Transportation Needs  Food Insecurity  Raymond Hood was given information about Medicaid Managed Care Coordination team services today. Raymond Hood Patient agreed to services and verbal consent obtained.  Engaged with patient  for by telephone forinitial visit in response to referral for case management and/or care coordination services.   Assessments/Interventions:  Review of past medical history, allergies, medications, health status, including review of consultants reports, laboratory and other test data, was performed as part of comprehensive evaluation and provision of chronic care management services.  SDOH: (Social Determinant of Health) assessments and interventions performed: SDOH Interventions    Flowsheet Row Telephone from 02/28/2022 in [place] Office Visit from 02/03/2022 in [place] Office Visit from 06/01/2021 in [place] Office Visit from 01/25/2021 in [place]  SDOH Interventions      Food Insecurity Interventions Other (Comment) -- -- --  Depression Interventions/Treatment  -- Counseling, Currently on Treatment Currently on Treatment, Medication Medication, Currently on Treatment, Counseling, Referral to Psychiatry     BSW completed a telephone outreach with patient. He states he recently got his own place in [place]. He does not have any income, has applied for disability and is waiting on a hearing date. He does receive a little over 200 in foodstamps each month but does not have transportation. Patient states he sometimes has neighbors or friends  take him to the grocery. He receieves foodstamps on the 17th of each month and does have some food in the home now. Patient states he has a voucher for his rent and the homeless shelter is paying his utilities. After researching there are no resources that will take patient to the store for free. [facility] only provides transportation for medical visits, [facility] does not have any volunteers for transportation at this time, patient does not qualify for MOW and will have to pay for scat services since he is not receiving disability. BSW will reach out to EMT to see if they can take patient food for food pantries.  Advanced Directives Status:  Not addressed in this encounter.  Care Plan                 No Known Allergies  Medications Reviewed Today     Reviewed by Raymond Hood, Paramedic (Certified Medical Assistant) on 02/28/22 at [place] List Status: <None>   Medication Order Taking? Sig Documenting Provider Last Dose Status Informant  albuterol (VENTOLIN HFA) 108 (90 Base) MCG/ACT inhaler [id number] Yes Inhale 2 puffs into the lungs every 6  hours as needed for wheezing or shortness of breath. Raymond Stain, MD Taking Active   atorvastatin (LIPITOR) 10 MG tablet [id number] Yes Take 1 tablet (10 mg total) by mouth once daily. Raymond Stain, MD Taking Active   Blood Glucose Monitoring Suppl (ACCU-CHEK GUIDE) w/Device [id number] [id number] Yes Use up to four times daily as directed. Raymond Stain, MD Taking Active   budesonide-formoterol [facility] 160-4.5 MCG/ACT inhaler [id number] Yes Inhale 2 puffs into the lungs 2 (two) times daily. Raymond Stain, MD Taking Active   dapagliflozin propanediol (FARXIGA) 10 MG  TABS tablet [id number] Yes Take 1 tablet (10 mg total) by mouth daily. Raymond Stain, MD Taking Active   diphenoxylate-atropine (LOMOTIL) 2.5-0.025 MG tablet [id number] Yes Take 1 tablet by mouth 4 (four) times daily as needed for diarrhea or loose stools. Raymond Stain, MD Taking Active   DULoxetine (CYMBALTA) 60 MG capsule [id number] Yes Take 1 capsule (60 mg total) by mouth daily. Raymond Stain, MD Taking Active   famotidine (PEPCID) 20 MG tablet [id number] Yes Take 1 tablet (20 mg total) by mouth 2 (two) times daily. Raymond Stain, MD Taking Active   Fingerstix Lancets MISC [id number] Yes Use as directed up to 4 times daily. Raymond Stain, MD Taking Active   fluticasone [facility]) 50 MCG/ACT nasal spray [id number] Yes Place 2 sprays into both nostrils daily. Raymond Stain, MD Taking Active   furosemide (LASIX) 40 MG tablet [id number] Yes Take 1 tablet (40 mg total) by mouth daily. Raymond Stain, MD Taking Active   gabapentin (NEURONTIN) 300 MG capsule [id number] Yes Take 2 capsules in the morning AND 1 capsule midday AND 2 capsules every evening. Raymond Stain, MD Taking Active   glucose blood test strip [id number] Yes Use four times daily as directed. Raymond Stain, MD Taking Active   hydrOXYzine (ATARAX) 25 MG tablet [id number] Yes Take 1 tablet (25 mg total) by mouth 3 (three) times daily as needed. Raymond Stain, MD Taking Active   insulin aspart protamine - aspart (NOVOLOG 70/30 MIX) (70-30) 100 UNIT/ML FlexPen [id number] Yes Inject 45 Units into the skin 2 (two) times daily with a meal. Raymond Stain, MD Taking Active   Insulin Pen Needle (UNIFINE PENTIPS) 31G X 5 MM MISC [id number] Yes use with insulin pen Raymond Stain, MD Taking Active   linagliptin (TRADJENTA) 5 MG TABS tablet [id number] Yes Take 1 tablet (5 mg total) by mouth daily. Raymond Stain, MD Taking Active   lipase/protease/amylase (CREON) 12000-38000 units CPEP capsule [id number] Yes Take 1 capsule (12,000 Units total) by mouth 3 (three) times daily before meals. Raymond Stain, MD Taking Active   metFORMIN (GLUCOPHAGE) 500 MG tablet [id number] Yes Take 2 tablets (1,000 mg total) by mouth 2 (two) times daily with a meal. Raymond Stain, MD Taking Active    Multiple Vitamins-Minerals (PRESERVISION AREDS 2) CAPS [id number] Yes Take 1 capsule by mouth 2 (two) times daily with food. Take 1 in the morning and 1 in the evening. Raymond Stain, MD Taking Active   nicotine polacrilex (NICORETTE MINI) 4 MG lozenge [id number] Yes Use 3 times a day to quit smoking Raymond Stain, MD Taking Active   olopatadine (PATANOL) 0.1 % ophthalmic solution [id number] Yes Place 1 drop into the right eye 2 (two) times daily. Raymond Stain, MD Taking Active   polyethylene glycol powder [facility]) 17 GM/SCOOP powder [id number] Yes Take 17 g by mouth daily as needed for moderate constipation or severe constipation. Raymond Stain, MD Taking Active   risperidone (RISPERDAL) 4 MG tablet [id number] Yes Take 1 tablet (4 mg total) by mouth once daily. Raymond Stain, MD Taking Active   tamsulosin [facility]) 0.4 MG CAPS capsule [id number] Yes Take 1 capsule (0.4 mg total) by mouth daily. Raymond Stain, MD Taking Active             Patient Active Problem List   Diagnosis Date Noted   Anxiety and depression 07/14/2021   Essential hypertension 07/14/2021   Chronic diastolic  heart failure ([place]) 01/14/2021   Type 2 diabetes mellitus with hyperglycemia, with long-term current use of insulin ([place]) 12/14/2020   GERD (gastroesophageal reflux disease) 10/15/2020   Tobacco abuse 10/15/2020   Hypertension associated with diabetes ([place]) 10/15/2020   Renal mass, right 10/15/2020   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   Morbid obesity with BMI of 50.0-59.9, adult ([place]) 10/15/2020   Sheltered homelessness 10/15/2020   MDD (major depressive disorder), recurrent episode ([place]) 09/28/2020   Anxiety 09/28/2020   Schizo affective schizophrenia ([place]) 09/28/2020   Numbness and tingling in both hands 05/28/2020   Adrenal hyperplasia ([place]) 06/05/2019   Low back pain 08/29/2018   Multiple joint pain 08/29/2018   OSA/OHS 08/17/2018   Exocrine pancreatic insufficiency  07/02/2018   Asthma without status asthmaticus 03/27/2018   Epidermal inclusion cyst 07/20/2012   Psoriasis, unspecified 06/28/2012   Erectile dysfunction 06/28/2012   ADHD 11/17/1981    Conditions to be addressed/monitored per PCP order:   community resources  There are no care plans that you recently modified to display for this patient.   Follow up:  Patient agrees to Care Plan and Follow-up.  Plan: The Managed Medicaid care management team will reach out to the patient again over the next 10 days.  Date/time of next scheduled Social Work care management/care coordination outreach:  03/18/22 Mickel Fuchs, Arita Miss, [place] Managed Medicaid Team  [phone]

## 2022-03-04 NOTE — Patient Instructions (Signed)
Visit Information  Raymond Hood was given information about Medicaid Managed Care team care coordination services as a part of their [facility] Medicaid benefit. Raymond Hood verbally consented to engagement with the [facility] Managed Care team.   If you are experiencing a medical emergency, please call 911 or report to your local emergency department or urgent care.   If you have a non-emergency medical problem during routine business hours, please contact your provider's office and ask to speak with a nurse.   For questions related to your [facility] health plan, please call: [phone] or go here:[email or web address]  If you would like to schedule transportation through your [facility] plan, please call the following number at least 2 days in advance of your appointment: [phone].  You can also use the MTM portal or MTM mobile app to manage your rides. For the portal, please go to [email or web address].  Call the [place] at [phone], at any time, 24 hours a day, 7 days a week. If you are in danger or need immediate medical attention call 911.  If you would like help to quit smoking, call 1-800-QUIT-NOW (984) 056-6811) OR Espaol: 1-855-Djelo-Ya HD:1601594) o para ms informacin haga clic aqu or Text READY to 200-400 to register via text  [name] - following are the goals we discussed in your visit today:   Goals Addressed   None       Social Worker will follow up in 10 days.   Raymond Hood, BSW, [place] Managed Medicaid Team  [phone]   Following is a copy of your plan of care:  There are no care plans that you recently modified to display for this patient.

## 2022-03-08 ENCOUNTER — Other Ambulatory Visit: Payer: Self-pay | Admitting: Critical Care Medicine

## 2022-03-08 ENCOUNTER — Telehealth (HOSPITAL_COMMUNITY): Payer: Self-pay

## 2022-03-08 ENCOUNTER — Other Ambulatory Visit (HOSPITAL_COMMUNITY): Payer: Self-pay | Admitting: Critical Care Medicine

## 2022-03-08 MED ORDER — FAMOTIDINE 20 MG PO TABS: 20.0000 mg | ORAL_TABLET | Freq: Two times a day (BID) | ORAL | 2 refills | Status: DC

## 2022-03-08 MED ORDER — NICOTINE 21 MG/24HR TD PT24: 21.0000 mg | MEDICATED_PATCH | Freq: Every day | TRANSDERMAL | 0 refills | Status: DC

## 2022-03-08 MED ORDER — ATORVASTATIN CALCIUM 10 MG PO TABS: 10.0000 mg | ORAL_TABLET | Freq: Every day | ORAL | 1 refills | Status: DC

## 2022-03-08 NOTE — Telephone Encounter (Signed)
[facility] reached out to me requesting refills for Mr. Bruegger for Atorvastatin and Famotidine. Forwarded to PCP -Please send refills so they can fulfill his bubble packs ASAP.   [name], [place] 03/08/2022

## 2022-03-08 NOTE — Addendum Note (Signed)
Addended by: [name] on: 03/08/2022 05:01 PM   Modules accepted: Orders

## 2022-03-08 NOTE — Telephone Encounter (Signed)
Refills sent

## 2022-03-14 ENCOUNTER — Ambulatory Visit: Payer: Medicaid Other | Admitting: Podiatry

## 2022-03-16 ENCOUNTER — Ambulatory Visit (INDEPENDENT_AMBULATORY_CARE_PROVIDER_SITE_OTHER): Payer: Medicaid Other | Admitting: Podiatry

## 2022-03-16 DIAGNOSIS — E1142 Type 2 diabetes mellitus with diabetic polyneuropathy: Secondary | ICD-10-CM | POA: Diagnosis not present

## 2022-03-16 DIAGNOSIS — B351 Tinea unguium: Secondary | ICD-10-CM

## 2022-03-16 DIAGNOSIS — E1165 Type 2 diabetes mellitus with hyperglycemia: Secondary | ICD-10-CM

## 2022-03-16 DIAGNOSIS — Z794 Long term (current) use of insulin: Secondary | ICD-10-CM | POA: Diagnosis not present

## 2022-03-16 DIAGNOSIS — M79676 Pain in unspecified toe(s): Secondary | ICD-10-CM

## 2022-03-17 ENCOUNTER — Encounter: Payer: Self-pay | Admitting: Podiatry

## 2022-03-17 ENCOUNTER — Telehealth: Payer: Self-pay | Admitting: Emergency Medicine

## 2022-03-17 NOTE — Telephone Encounter (Signed)
Tried Calling [employer] back at number given below but unable to make contact or leave voicemail   I need to know information is need for correction

## 2022-03-17 NOTE — Telephone Encounter (Signed)
Copied from [place] [phone]. Topic: General - Other >> Mar 17, 2022 11:31 AM Leone Payor F wrote: Reason for CRM: [employer] is calling regarding some paperwork they received back. [employer] says there is information missing. Please advise

## 2022-03-17 NOTE — Progress Notes (Signed)
This patient presents to the office for nail care.  He presents for at risk foot care due to diabetic neuropathy.  He presents to the office with stained brown feet with long thick nails.  His nails are neglected as well as his feet.  Raymond Hood brought him into the treatment room and immediately threw out his urine smelled socks planning on dispensing him a new pair of socks. .  He has been diagnosed with type 2 diabetes, morbid obesity homelessness and schizophrenia.  He presents to the office for routine foot care.  General Appearance  Alert, conversant and in no acute stress.  Vascular  Dorsalis pedis and posterior tibial  pulses are  not palpable  due to swollen feet. bilaterally.  Capillary return is within normal limits  bilaterally. Temperature is within normal limits  bilaterally.  Neurologic  Senn-Weinstein monofilament wire test within normal limits  bilaterally. Muscle power within normal limits bilaterally.  Nails Thick disfigured discolored nails with subungual debris  from hallux to fifth toes bilaterally. No evidence of bacterial infection or drainage bilaterally.There is significant brown toe jam on his digits and interdigitally.    Orthopedic  No limitations of motion  feet .  No crepitus or effusions noted.  No bony pathology or digital deformities noted.  Skin  normotropic skin with no porokeratosis noted bilaterally.  No signs of infections or ulcers noted.    There is significant brown discoloration on his rearfoot and  forefoot  both feet.    Onychomycosis  Diabetes.  ROV.  Generalized foot care and evaluation was performed.  Debridement of nails was performed with nail nipper.  Upon entering the room I saw his feet were greatly neglected.  He says he soaks his feet in water but wears his socks since he is unable to remove his socks himself.  He has significant brown stained on his feet .  The first thing I did was soak his feet in soapy water to cleanse his feet.    I then  provided him with powerstep insoles since I threw away his shoe insoles which were causing his stained skin.    I dried his feet with towels and then debrided his nails.  I kept cleansing his toes from significant toe jam. I gave him a new pair of socks but had difficulty putting his socks on due to his swelling.     RTC 10 weeks   Raymond Hood DPM

## 2022-03-18 ENCOUNTER — Other Ambulatory Visit: Payer: Medicaid Other

## 2022-03-18 NOTE — Telephone Encounter (Signed)
[employer] called and stated that the form Needs to mention the quantity to dispense and sign and date

## 2022-03-18 NOTE — Telephone Encounter (Signed)
FYI

## 2022-03-18 NOTE — Patient Outreach (Signed)
  Medicaid Managed Care   Unsuccessful Outreach Note  03/18/2022 Name: Raymond Hood MRN: [id number] DOB: 02/04/1970  Referred by: Wright, Patrick E, MD Reason for referral : High Risk Managed Medicaid (MM social work unsuccessful telephone outreach )   An unsuccessful telephone outreach was attempted today. The patient was referred to the case management team for assistance with care management and care coordination.   Follow Up Plan: A HIPAA compliant phone message was left for the patient providing contact information and requesting a return call.   Josha Weekley, BSW, MHA [facility]  [place]  High Risk Managed Medicaid Team  [phone]  

## 2022-03-18 NOTE — Patient Instructions (Signed)
  Medicaid Managed Care   Unsuccessful Outreach Note  03/18/2022 Name: Raymond Hood MRN: [id number] DOB: 1970/09/06  Referred by: [name], MD Reason for referral : High Risk Managed Medicaid (MM social work unsuccessful telephone outreach )   An unsuccessful telephone outreach was attempted today. The patient was referred to the case management team for assistance with care management and care coordination.   Follow Up Plan: A HIPAA compliant phone message was left for the patient providing contact information and requesting a return call.   Mickel Fuchs, BSW, [place] Managed Medicaid Team  [phone]

## 2022-03-24 ENCOUNTER — Telehealth (HOSPITAL_COMMUNITY): Payer: Self-pay

## 2022-03-24 NOTE — Telephone Encounter (Signed)
Spoke to [employer] who reports he had a successful PCS interview and they should be sending an aide out four days a week starting this week but no one has come out yet. I advised him to reach out to [facility] office to follow up- he agreed with plan.   He agreed with Paramedicine visit on Monday.   Call complete.   [name], [place] 03/24/2022

## 2022-03-24 NOTE — Telephone Encounter (Signed)
[employer] called back asking for the quantity on all the items listed on the order  CB#  [phone]  [facility]

## 2022-03-24 NOTE — Telephone Encounter (Signed)
Attempted to reach Mr. Gaby to complete a home paramedicine visit- no answer. Message left- will continue to attempt to reach out.   [name], [place] 03/24/2022

## 2022-03-25 ENCOUNTER — Other Ambulatory Visit: Payer: Self-pay

## 2022-03-25 NOTE — Telephone Encounter (Signed)
Called [employer] and was walked through both orders and what needed to be change. I did let them know that provider is not in office( They said that ok)   Joya Gaskins : I show you what's need to be corrected once you return to the office

## 2022-03-28 NOTE — Telephone Encounter (Signed)
noted 

## 2022-03-29 ENCOUNTER — Other Ambulatory Visit (HOSPITAL_COMMUNITY): Payer: Self-pay

## 2022-03-29 ENCOUNTER — Telehealth (HOSPITAL_COMMUNITY): Payer: Self-pay

## 2022-03-29 NOTE — Progress Notes (Signed)
Paramedicine Encounter    Patient ID: Raymond Hood, male    DOB: 09-12-1970, 52 y.o.   MRN: [id number]   Complaints- short of breath on exertion, general aches and pains.   Assessment- CAOX4, warm and dry seated at his kitchen table. Wearing same soiled clothing he was wearing last visit. No shortness of breath at rest, lung sound noting wheezing with some ronchi. Has not used inhalers yet today and admits to smoking cigarettes. Mild lower leg swelling. -PCA on scene, apartment is much cleaner with no fecal matter on floors, bathroom clean and smelling fresh. No dirty dishes in sink. Counters clear and clean of any food debris. PCA actively washing clothes for patient.   Compliance with meds- taking meds from bubble packs daily, using inhalers daily, using glucose monitor and insulin daily.   Pill box filled- using bubble packs   Refills needed- Albuterol, Symbicort, Novolog  Meds changes since last visit- NONE     Social changes- Has PCA Aide in the home now her name is  Rhonda Maddox [phone]    BP (!) 140/82   Pulse (!) 104   Resp 18   SpO2 94%  Weight yesterday-didn't weigh  Last visit weight-386lbs   Arrived for home visit for Raymond Hood who reports feeling okay. He says he is feeling short of breath on exertion which is normal for him as well some general aches and pains. He reports still having some diarrhea here and there but not near as bad it was prior to starting Creon. I obtained vitals and assessment as noted. I reviewed bubble packs. He is on his 3rd week about to start his 4th. He has two insulin pens left and is almost out of Albuterol and Symbicort. I will request refills for same. Lungs noted some wheezing throughout as well as some rhonchi. I stressed the importance of using his nicotine patches and trying to reduce use of cigarettes. He verbalized understanding. We discussed overall care goals and education. He agreed to home visit in one month. I will call in  refills to [place]. I will also reach out to Charlott Rakes LCSW at [facility] to make her aware of findings. Visit complete.    [name], [place]  ACTION: Home visit completed    Patient Care Team: [name], MD as PCP - General (Pulmonary Disease)  Patient Active Problem List   Diagnosis Date Noted   Anxiety and depression 07/14/2021   Essential hypertension 07/14/2021   Chronic diastolic heart failure ([place]) 01/14/2021   Type 2 diabetes mellitus with hyperglycemia, with long-term current use of insulin ([place]) 12/14/2020   GERD (gastroesophageal reflux disease) 10/15/2020   Tobacco abuse 10/15/2020   Hypertension associated with diabetes ([place]) 10/15/2020   Renal mass, right 10/15/2020   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   Morbid obesity with BMI of 50.0-59.9, adult ([place]) 10/15/2020   Sheltered homelessness 10/15/2020   MDD (major depressive disorder), recurrent episode ([place]) 09/28/2020   Anxiety 09/28/2020   Schizo affective schizophrenia ([place]) 09/28/2020   Numbness and tingling in both hands 05/28/2020   Adrenal hyperplasia ([place]) 06/05/2019   Low back pain 08/29/2018   Multiple joint pain 08/29/2018   OSA/OHS 08/17/2018   Exocrine pancreatic insufficiency 07/02/2018   Asthma without status asthmaticus 03/27/2018   Epidermal inclusion cyst 07/20/2012   Psoriasis, unspecified 06/28/2012   Erectile dysfunction 06/28/2012   ADHD 11/17/1981    Current Outpatient Medications:    albuterol (VENTOLIN HFA) 108 (90  Base) MCG/ACT inhaler, Inhale 2 puffs into the lungs every 6  hours as needed for wheezing or shortness of breath., Disp: 6.7 g, Rfl: 0   atorvastatin (LIPITOR) 10 MG tablet, Take 1 tablet (10 mg total) by mouth once daily., Disp: 90 tablet, Rfl: 1   Blood Glucose Monitoring Suppl (ACCU-CHEK GUIDE) w/Device KIT, Use up to four times daily as directed., Disp: 1 kit, Rfl: 0   budesonide-formoterol (SYMBICORT) 160-4.5  MCG/ACT inhaler, Inhale 2 puffs into the lungs 2 (two) times daily., Disp: 10.2 g, Rfl: 0   dapagliflozin propanediol (FARXIGA) 10 MG TABS tablet, Take 1 tablet (10 mg total) by mouth daily., Disp: 60 tablet, Rfl: 2   diphenoxylate-atropine (LOMOTIL) 2.5-0.025 MG tablet, Take 1 tablet by mouth 4 (four) times daily as needed for diarrhea or loose stools., Disp: 30 tablet, Rfl: 0   DULoxetine (CYMBALTA) 60 MG capsule, Take 1 capsule (60 mg total) by mouth daily., Disp: 60 capsule, Rfl: 2   famotidine (PEPCID) 20 MG tablet, Take 1 tablet (20 mg total) by mouth 2 (two) times daily., Disp: 120 tablet, Rfl: 2   Fingerstix Lancets MISC, Use as directed up to 4 times daily., Disp: 100 each, Rfl: 0   fluticasone (FLONASE) 50 MCG/ACT nasal spray, Place 2 sprays into both nostrils daily., Disp: 16 g, Rfl: 0   furosemide (LASIX) 40 MG tablet, Take 1 tablet (40 mg total) by mouth daily., Disp: 60 tablet, Rfl: 2   gabapentin (NEURONTIN) 300 MG capsule, Take 2 capsules in the morning AND 1 capsule midday AND 2 capsules every evening., Disp: 150 capsule, Rfl: 1   glucose blood test strip, Use four times daily as directed., Disp: 100 each, Rfl: 0   hydrOXYzine (ATARAX) 25 MG tablet, Take 1 tablet (25 mg total) by mouth 3 (three) times daily as needed., Disp: 90 tablet, Rfl: 1   insulin aspart protamine - aspart (NOVOLOG 70/30 MIX) (70-30) 100 UNIT/ML FlexPen, Inject 45 Units into the skin 2 (two) times daily with a meal., Disp: 15 mL, Rfl: 2   Insulin Pen Needle (UNIFINE PENTIPS) 31G X 5 MM MISC, use with insulin pen, Disp: 100 each, Rfl: 1   linagliptin (TRADJENTA) 5 MG TABS tablet, Take 1 tablet (5 mg total) by mouth daily., Disp: 30 tablet, Rfl: 2   lipase/protease/amylase (CREON) 12000-38000 units CPEP capsule, Take 1 capsule (12,000 Units total) by mouth 3 (three) times daily before meals., Disp: 270 capsule, Rfl: 1   metFORMIN (GLUCOPHAGE) 500 MG tablet, Take 2 tablets (1,000 mg total) by mouth 2 (two) times  daily with a meal., Disp: 120 tablet, Rfl: 2   Multiple Vitamins-Minerals (PRESERVISION AREDS 2) CAPS, Take 1 capsule by mouth 2 (two) times daily with food. Take 1 in the morning and 1 in the evening., Disp: 120 capsule, Rfl: 6   nicotine (NICODERM CQ - DOSED IN MG/24 HOURS) 21 mg/24hr patch, Place 1 patch (21 mg total) onto the skin daily., Disp: 28 patch, Rfl: 0   olopatadine (PATANOL) 0.1 % ophthalmic solution, Place 1 drop into the right eye 2 (two) times daily., Disp: 5 mL, Rfl: 12   risperidone (RISPERDAL) 4 MG tablet, Take 1 tablet (4 mg total) by mouth once daily., Disp: 60 tablet, Rfl: 2   tamsulosin (FLOMAX) 0.4 MG CAPS capsule, Take 1 capsule (0.4 mg total) by mouth daily., Disp: 60 capsule, Rfl: 2   nicotine polacrilex (NICORETTE MINI) 4 MG lozenge, Use 3 times a day to quit smoking (Patient not taking: Reported  on 03/29/2022), Disp: 100 tablet, Rfl: 4   polyethylene glycol powder (GLYCOLAX/MIRALAX) 17 GM/SCOOP powder, Take 17 g by mouth daily as needed for moderate constipation or severe constipation. (Patient not taking: Reported on 03/29/2022), Disp: 238 g, Rfl: 0 No Known Allergies   Social History   Socioeconomic History   Marital status: Single    Spouse name: Not on file   Number of children: Not on file   Years of education: Not on file   Highest education level: Not on file  Occupational History   Not on file  Tobacco Use   Smoking status: Every Day    Packs/day: 1    Types: Cigarettes   Smokeless tobacco: Never  Vaping Use   Vaping Use: Never used  Substance and Sexual Activity   Alcohol use: Not Currently   Drug use: Never   Sexual activity: Not on file  Other Topics Concern   Not on file  Social History Narrative   Not on file   Social Determinants of Health   Financial Resource Strain: Not on file  Food Insecurity: Food Insecurity Present (02/28/2022)   Hunger Vital Sign    Worried About Running Out of Food in the Last Year: Often true    Ran Out of  Food in the Last Year: Often true  Transportation Needs: Not on file  Physical Activity: Not on file  Stress: Not on file  Social Connections: Not on file  Intimate Partner Violence: Not on file    Physical Exam      No future appointments.

## 2022-03-29 NOTE — Telephone Encounter (Signed)
Raymond Hood is needing refills for Albuterol and Symbicort sent into [facility] please.   [name], [place] 03/29/2022

## 2022-03-30 ENCOUNTER — Other Ambulatory Visit: Payer: Self-pay | Admitting: Pharmacist

## 2022-03-30 MED ORDER — ALBUTEROL SULFATE HFA 108 (90 BASE) MCG/ACT IN AERS: 2.0000 | INHALATION_SPRAY | Freq: Four times a day (QID) | RESPIRATORY_TRACT | 2 refills | Status: DC | PRN

## 2022-03-30 MED ORDER — BUDESONIDE-FORMOTEROL FUMARATE 160-4.5 MCG/ACT IN AERO: 2.0000 | INHALATION_SPRAY | Freq: Two times a day (BID) | RESPIRATORY_TRACT | 2 refills | Status: DC

## 2022-04-05 ENCOUNTER — Other Ambulatory Visit: Payer: Self-pay | Admitting: Critical Care Medicine

## 2022-04-11 ENCOUNTER — Telehealth: Payer: Self-pay

## 2022-04-11 NOTE — Telephone Encounter (Signed)
I received a message from Dr Joya Gaskins noting that the patient is concerned that his PCS services have stopped.   I called [employer]: [phone]  and was able to speak with Lyric, the patient's care manager.  She explained that the patient has been transferred from [facility] to [facility] based on his diagnosis of schizoaffective disorder.  She was not exactly sure why/how the transfer was made. She stated she was instructed to close the patient's records. I explained patient's concern about the loss of PCS.  She said he should not be losing services but would check on that and call me back.  I received a call back from [place]: [phone] and she explained that with [facility], he will still be eligible for his current services, health insurance and additional behavioral health benefits as well as PCS.  However, there may be a gap in PCS  because the current agency providing his services is not in network with [facility], so he will need to have a new PCS provider.  She said that nothing is needed from the PCP at this time.  The current referral for PCS is still active.  Lyric said that she called and explained all of this to the patient and also told him that he could refuse to transfer to [facility] and stay with [facility].  He said he wants to go with [facility] because he could benefit from seeing a psychiatrist.   Brunetta Genera: [phone] . Patient will have the same member ID as [facility] Medicaid.

## 2022-04-11 NOTE — Telephone Encounter (Signed)
Thank you Jane! 

## 2022-04-19 ENCOUNTER — Telehealth (HOSPITAL_COMMUNITY): Payer: Self-pay

## 2022-04-19 NOTE — Telephone Encounter (Signed)
Called to attempt to schedule home visit for Jon[place] with no answer and no option to leave a message. I will continue to reach out.   Maralyn Sago, EMT-Paramedic [phone] 04/19/2022

## 2022-04-27 ENCOUNTER — Telehealth: Payer: Self-pay | Admitting: Critical Care Medicine

## 2022-04-27 ENCOUNTER — Telehealth (HOSPITAL_COMMUNITY): Payer: Self-pay

## 2022-04-27 NOTE — Telephone Encounter (Signed)
I spoke to the patient and obtained verbal authorization to submit the medical report for [facility] and [employer].  The completed form was then faxed to DSS: [phone]

## 2022-04-27 NOTE — Telephone Encounter (Signed)
Spoke to [place] and confirmed a home visit for next week- he agreed to meet Monday or Tuesday next week. I advised him I would call to schedule by Thursday this week. He agreed. Call complete.  Maralyn Sago, EMT-Paramedic [phone] 04/27/2022

## 2022-04-27 NOTE — Telephone Encounter (Signed)
This pt called me to say his SNAP was cancelled and I have to write a letter stating he is no able to work  Is this true, can you help me with this??

## 2022-05-03 ENCOUNTER — Other Ambulatory Visit: Payer: Self-pay | Admitting: Critical Care Medicine

## 2022-05-03 NOTE — Telephone Encounter (Signed)
Requested Prescriptions  Pending Prescriptions Disp Refills   metFORMIN (GLUCOPHAGE) 500 MG tablet [Pharmacy Med Name: METFORMIN HCL 500 MG ORAL TABLET] 120 tablet 2    Sig: TAKE 2 TABLETS (1,000 MG TOTAL) BY MOUTH 2 (TWO) TIMES DAILY WITH A MEAL.     Endocrinology:  Diabetes - Biguanides Failed - 05/03/2022  1:26 PM      Failed - HBA1C is between 0 and 7.9 and within 180 days    Hemoglobin A1C  Date Value Ref Range Status  02/03/2022 8.4 (A) 4.0 - 5.6 % Final   HbA1c, POC (controlled diabetic range)  Date Value Ref Range Status  06/01/2021 8.3 (A) 0.0 - 7.0 % Final   Hgb A1c MFr Bld  Date Value Ref Range Status  07/27/2021 8.9 (H) 4.8 - 5.6 % Final    Comment:    (NOTE) Pre diabetes:          5.7%-6.4%  Diabetes:              >6.4%  Glycemic control for   <7.0% adults with diabetes          Failed - B12 Level in normal range and within 720 days    No results found for: "VITAMINB12"       Passed - Cr in normal range and within 360 days    Creatinine, Ser  Date Value Ref Range Status  02/03/2022 0.93 0.76 - 1.27 mg/dL Final         Passed - eGFR in normal range and within 360 days    GFR, Estimated  Date Value Ref Range Status  07/30/2021 >60 >60 mL/min Final    Comment:    (NOTE) Calculated using the CKD-EPI Creatinine Equation (2021)    eGFR  Date Value Ref Range Status  02/03/2022 101 >59 mL/min/1.73 Final         Passed - Valid encounter within last 6 months    Recent Outpatient Visits           2 months ago Type 2 diabetes mellitus with hyperglycemia, without long-term current use of insulin (HCC)   [place] [facility] Storm Frisk, MD   8 months ago AKI (acute kidney injury) [facility])   [place] [facility] & [facility] Storm Frisk, MD   11 months ago Type 2 diabetes mellitus with hyperglycemia, without long-term current use of insulin [facility])   [place] Storm Frisk, MD   1 year ago Type 2 diabetes mellitus with hyperglycemia, without long-term current use of insulin [facility])   [place] [facility] Storm Frisk, MD   1 year ago Hypertension associated with diabetes [facility])   [place] [facility] Storm Frisk, MD              Passed - CBC within normal limits and completed in the last 12 months    WBC  Date Value Ref Range Status  02/03/2022 12.7 (H) 3.4 - 10.8 x10E3/uL Final  07/27/2021 12.4 (H) 4.0 - 10.5 K/uL Final   RBC  Date Value Ref Range Status  02/03/2022 6.48 (H) 4.14 - 5.80 x10E6/uL Final  07/27/2021 5.98 (H) 4.22 - 5.81 MIL/uL Final   Hemoglobin  Date Value Ref Range Status  02/03/2022 16.8 13.0 - 17.7 g/dL Final   Hematocrit  Date Value Ref Range Status  02/03/2022 54.3 (H) 37.5 - 51.0 %  Final   MCHC  Date Value Ref Range Status  02/03/2022 30.9 (L) 31.5 - 35.7 g/dL Final  16/10/9602 54.0 30.0 - 36.0 g/dL Final   [facility]  Date Value Ref Range Status  02/03/2022 25.9 (L) 26.6 - 33.0 pg Final  07/27/2021 26.9 26.0 - 34.0 pg Final   MCV  Date Value Ref Range Status  02/03/2022 84 79 - 97 fL Final   No results found for: "PLTCOUNTKUC", "LABPLAT", "POCPLA" RDW  Date Value Ref Range Status  02/03/2022 17.2 (H) 11.6 - 15.4 % Final          TRADJENTA 5 MG TABS tablet [Pharmacy Med Name: TRADJENTA 5 MG ORAL TABLET] 30 tablet 2    Sig: TAKE 1 TABLET (5 MG TOTAL) BY MOUTH DAILY.     Endocrinology:  Diabetes - DPP-4 Inhibitors - linagliptin Failed - 05/03/2022  1:26 PM      Failed - HBA1C is between 0 and 7.9 and within 180 days    Hemoglobin A1C  Date Value Ref Range Status  02/03/2022 8.4 (A) 4.0 - 5.6 % Final   HbA1c, POC (controlled diabetic range)  Date Value Ref Range Status  06/01/2021 8.3 (A) 0.0 - 7.0 % Final   Hgb A1c MFr Bld  Date Value Ref Range Status  07/27/2021 8.9 (H) 4.8 - 5.6 % Final    Comment:    (NOTE) Pre diabetes:           5.7%-6.4%  Diabetes:              >6.4%  Glycemic control for   <7.0% adults with diabetes          Passed - Valid encounter within last 6 months    Recent Outpatient Visits           2 months ago Type 2 diabetes mellitus with hyperglycemia, without long-term current use of insulin [facility])   [facility] Storm Frisk, MD   8 months ago AKI (acute kidney injury) [facility])   [place] [facility] Storm Frisk, MD   11 months ago Type 2 diabetes mellitus with hyperglycemia, without long-term current use of insulin [facility])   [place] [facility] Storm Frisk, MD   1 year ago Type 2 diabetes mellitus with hyperglycemia, without long-term current use of insulin [facility])   [place] [facility] Storm Frisk, MD   1 year ago Hypertension associated with diabetes [facility])    [place] [facility] Storm Frisk, MD

## 2022-05-05 ENCOUNTER — Other Ambulatory Visit: Payer: Self-pay

## 2022-05-05 ENCOUNTER — Telehealth: Payer: Self-pay

## 2022-05-05 NOTE — Progress Notes (Signed)
Paramedicine Encounter    Patient ID: Raymond Hood, male    DOB: [birth year], 52 y.o.   MRN: [id number]   Complaints- foot and lower back pain, shortness of breath on exertion, trouble cleaning and getting around, food insecurity   Assessment- CAOX4, warm and dry seated at kitchen table, lungs wheezing in all lobes, skin dry and cracking, appears he hasn't bathed recently.   Compliance with meds- some missed doses in bubble packs   Pill box filled- bubble packs   Refills needed- needs test strips and bubble packs   Meds changes since last visit- none     Social update-Seated at kitchen table which was covered in open cans of food, chicken wings, empty soda bottles, ants crawling on table. Raymond Hood was on phone with section [employer] case worker, dirty clothes, hair ungroomed, nails ungroomed, skin dry and cracking. Apartment dirty with dished in the sink. Floors noted to have dirt, dried fluids, bathroom had feces noted on the toilet and floors. He does not appear to be bathing or washing his clothes. He does not have a PCS aide at present. He does have ample cleaning supplies in the home but refuses to clean stating "it's hard". I was able to email his [employer] bill to [employer] at [employer]. According to Raymond Hood she spoke to [place] at [facility] and she reports [place] is supposed to be coming in to sign a paper and turn in the bill to them monthly and he has not done so. I made Raymond Hood aware of this and that he should be doing this and has to use the bus to get there- he is upset because he doesn't like to use the bus but I advised him he has to do so or they have said they will stop paying for his utility bill.    BP 138/70   Pulse 96   Resp 18   SpO2 92%  Weight yesterday-- didn't weigh  Last visit weight-- 386lbs    Raymond Hood, EMT-Paramedic [phone]  ACTION: Home visit completed    Patient Care Team: Storm Frisk, MD as PCP - General (Pulmonary Disease)  Patient  Active Problem List   Diagnosis Date Noted   Anxiety and depression 07/14/2021   Essential hypertension 07/14/2021   Chronic diastolic heart failure 01/14/2021   Type 2 diabetes mellitus with hyperglycemia, with long-term current use of insulin 12/14/2020   GERD (gastroesophageal reflux disease) 10/15/2020   Tobacco abuse 10/15/2020   Hypertension associated with diabetes 10/15/2020   Renal mass, right 10/15/2020   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   Morbid obesity with BMI of 50.0-59.9, adult 10/15/2020   Sheltered homelessness 10/15/2020   MDD (major depressive disorder), recurrent episode 09/28/2020   Anxiety 09/28/2020   Schizo affective schizophrenia 09/28/2020   Numbness and tingling in both hands 05/28/2020   Adrenal hyperplasia 06/05/2019   Low back pain 08/29/2018   Multiple joint pain 08/29/2018   OSA/OHS 08/17/2018   Exocrine pancreatic insufficiency 07/02/2018   Asthma without status asthmaticus 03/27/2018   Epidermal inclusion cyst 07/20/2012   Psoriasis, unspecified 06/28/2012   Erectile dysfunction 06/28/2012   ADHD 11/17/1981    Current Outpatient Medications:    albuterol (VENTOLIN HFA) 108 (90 Base) MCG/ACT inhaler, Inhale 2 puffs into the lungs every 6  hours as needed for wheezing or shortness of breath., Disp: 6.7 g, Rfl: 2   atorvastatin (LIPITOR) 10 MG tablet, Take 1 tablet (10 mg total) by mouth once daily., Disp: 90 tablet,  Rfl: 1   Blood Glucose Monitoring Suppl (ACCU-CHEK GUIDE) w/Device KIT, Use up to four times daily as directed., Disp: 1 kit, Rfl: 0   budesonide-formoterol (SYMBICORT) 160-4.5 MCG/ACT inhaler, Inhale 2 puffs into the lungs 2 (two) times daily., Disp: 10.2 g, Rfl: 2   dapagliflozin propanediol (FARXIGA) 10 MG TABS tablet, Take 1 tablet (10 mg total) by mouth daily., Disp: 60 tablet, Rfl: 2   diphenoxylate-atropine (LOMOTIL) 2.5-0.025 MG tablet, Take 1 tablet by mouth 4 (four) times daily as needed for diarrhea or loose stools.,  Disp: 30 tablet, Rfl: 0   DULoxetine (CYMBALTA) 60 MG capsule, Take 1 capsule (60 mg total) by mouth daily., Disp: 60 capsule, Rfl: 2   famotidine (PEPCID) 20 MG tablet, Take 1 tablet (20 mg total) by mouth 2 (two) times daily., Disp: 120 tablet, Rfl: 2   Fingerstix Lancets MISC, Use as directed up to 4 times daily., Disp: 100 each, Rfl: 0   fluticasone (FLONASE) 50 MCG/ACT nasal spray, Place 2 sprays into both nostrils daily., Disp: 16 g, Rfl: 0   furosemide (LASIX) 40 MG tablet, Take 1 tablet (40 mg total) by mouth daily., Disp: 60 tablet, Rfl: 2   gabapentin (NEURONTIN) 300 MG capsule, TAKE 2 CAPSULES IN THE MORNING AND 1 CAPSULE MIDDAY AND 2 CAPSULES EVERY EVENING., Disp: 150 capsule, Rfl: 1   glucose blood test strip, Use four times daily as directed., Disp: 100 each, Rfl: 0   hydrOXYzine (ATARAX) 25 MG tablet, TAKE 1 TABLET (25 MG TOTAL) BY MOUTH 3 (THREE) TIMES DAILY AS NEEDED. (AM+NOON+BEDTIME), Disp: 90 tablet, Rfl: 0   insulin aspart protamine - aspart (NOVOLOG 70/30 MIX) (70-30) 100 UNIT/ML FlexPen, Inject 45 Units into the skin 2 (two) times daily with a meal., Disp: 15 mL, Rfl: 2   Insulin Pen Needle (UNIFINE PENTIPS) 31G X 5 MM MISC, use with insulin pen, Disp: 100 each, Rfl: 1   metFORMIN (GLUCOPHAGE) 500 MG tablet, TAKE 2 TABLETS (1,000 MG TOTAL) BY MOUTH 2 (TWO) TIMES DAILY WITH A MEAL., Disp: 120 tablet, Rfl: 2   Multiple Vitamins-Minerals (PRESERVISION AREDS 2) CAPS, Take 1 capsule by mouth 2 (two) times daily with food. Take 1 in the morning and 1 in the evening., Disp: 120 capsule, Rfl: 6   nicotine (NICODERM CQ - DOSED IN MG/24 HOURS) 21 mg/24hr patch, Place 1 patch (21 mg total) onto the skin daily., Disp: 28 patch, Rfl: 0   nicotine polacrilex (NICORETTE MINI) 4 MG lozenge, Use 3 times a day to quit smoking, Disp: 100 tablet, Rfl: 4   olopatadine (PATANOL) 0.1 % ophthalmic solution, Place 1 drop into the right eye 2 (two) times daily., Disp: 5 mL, Rfl: 12   polyethylene glycol  powder (GLYCOLAX/MIRALAX) 17 GM/SCOOP powder, Take 17 g by mouth daily as needed for moderate constipation or severe constipation., Disp: 238 g, Rfl: 0   risperidone (RISPERDAL) 4 MG tablet, Take 1 tablet (4 mg total) by mouth once daily., Disp: 60 tablet, Rfl: 2   tamsulosin (FLOMAX) 0.4 MG CAPS capsule, Take 1 capsule (0.4 mg total) by mouth daily., Disp: 60 capsule, Rfl: 2   TRADJENTA 5 MG TABS tablet, TAKE 1 TABLET (5 MG TOTAL) BY MOUTH DAILY., Disp: 30 tablet, Rfl: 2 No Known Allergies   Social History   Socioeconomic History   Marital status: Single    Spouse name: Not on file   Number of children: Not on file   Years of education: Not on file   Highest education  level: Not on file  Occupational History   Not on file  Tobacco Use   Smoking status: Every Day    Packs/day: 1    Types: Cigarettes   Smokeless tobacco: Never  Vaping Use   Vaping Use: Never used  Substance and Sexual Activity   Alcohol use: Not Currently   Drug use: Never   Sexual activity: Not on file  Other Topics Concern   Not on file  Social History Narrative   Not on file   Social Determinants of Health   Financial Resource Strain: Not on file  Food Insecurity: Food Insecurity Present (05/05/2022)   Hunger Vital Sign    Worried About Running Out of Food in the Last Year: Often true    Ran Out of Food in the Last Year: Often true  Transportation Needs: Not on file  Physical Activity: Not on file  Stress: Not on file  Social Connections: Not on file  Intimate Partner Violence: Not on file    Physical Exam      No future appointments.

## 2022-05-05 NOTE — Telephone Encounter (Signed)
Message received from Maralyn Sago, EMT stating that the patient called DSS and they did not receive the [facility] document stating he is not able to work. I refaxed the document to : [phone] and also mailed it to [facility].   Herbert Seta also informed me that the patient has a Risk analyst that needs to be paid and [facility] has been paying it for him.  I spoke to [employer]- [employer]  and she said that they will pay the bill for the patient; but he  needs to come to the see her and sign the financial assistance form.  She said he can take the bus.  He is supposed to bring the bill to her monthly to sign the document.   I shared this information with Herbert Seta and she will encourage the patient to go to [facility].  Herbert Seta will also email a copy of the bill to [facility] in the event that the patient is not able to get there.   Email: [email or web address]

## 2022-05-26 ENCOUNTER — Telehealth (HOSPITAL_COMMUNITY): Payer: Self-pay

## 2022-05-26 ENCOUNTER — Other Ambulatory Visit: Payer: Self-pay | Admitting: Critical Care Medicine

## 2022-05-26 NOTE — Telephone Encounter (Signed)
Pt had left VM yesterday evening regarding refills needed on his inhalers and test strips. I reached out to [facility] to refill those and to be sent out.  He also mentioned that he had a light bill that needed to be taken to [facility] to be paid-I tried calling him back but he didn't answer.   According to heathers last note he needs to be taking the bus to get that paper down there and he needed to sign some paperwork also.   When he calls back I will remind him of that plan.   Kerry Hough, EMT-Paramedic  [phone] 05/26/2022

## 2022-06-02 ENCOUNTER — Other Ambulatory Visit (HOSPITAL_COMMUNITY): Payer: Self-pay

## 2022-06-02 NOTE — Progress Notes (Signed)
Paramedicine Encounter    Patient ID: Raymond Hood, male    DOB: 13-Sep-1970, 52 y.o.   MRN: [id number]   Arrived for home visit for Raymond Hood to find him outside on the benches by his apartment smoking a cigarette. He reports feeling "alright". He ambulated inside the apartment. Noting he is wearing same clothes he was in at our last home visit a month ago. When walking into the apartment it isn't as unclean as our last visit when I asked who cleaned up he said he had been doing some cleaning. I congratulated him on this. I obtained vitals and assessment. He had just taken his morning meds prior to me coming out but not his insulin.   BP- 130/86 HR- 105 RR- 20 O2- 90% CBG- 147  Leg swelling noted.  Lungs wheezing noted.   He reports he is using his inhalers.  I reviewed bubble packs and confirmed same. He has 3 weeks left.   He has [employer] due June 1 and has not completed. I will attempt to assist with same.   He is asking about a continuous glucose monitor- I will ask Dr. Delford Field.   He also called [facility] and they report he needs a PCS order sent from his PCP. I will inform Ersk[facility] [employer] [facility] of same.   Home visit complete. I will see Finnick in one month.   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   Morbid obesity with BMI of 50.0-59.9, adult (HCC) 10/15/2020   Sheltered  homelessness 10/15/2020   MDD (major depressive disorder), recurrent episode (HCC) 09/28/2020   Anxiety 09/28/2020   Schizo affective schizophrenia (HCC) 09/28/2020   Numbness and tingling in both hands 05/28/2020   Adrenal hyperplasia (HCC) 06/05/2019   Low back pain 08/29/2018   Multiple joint pain 08/29/2018   OSA/OHS 08/17/2018   Exocrine pancreatic insufficiency 07/02/2018   Asthma without status asthmaticus 03/27/2018   Epidermal inclusion cyst 07/20/2012   Psoriasis, unspecified 06/28/2012   Erectile dysfunction 06/28/2012   ADHD 11/17/1981    Current Outpatient Medications:    atorvastatin (LIPITOR) 10 MG tablet, Take 1 tablet (10 mg total) by mouth once daily., Disp: 90 tablet, Rfl: 1   Blood Glucose Monitoring Suppl (ACCU-CHEK GUIDE) w/Device KIT, Use up to four times daily as directed., Disp: 1 kit, Rfl: 0   dapagliflozin propanediol (FARXIGA) 10 MG TABS tablet, Take 1 tablet (10 mg total) by mouth daily., Disp: 60 tablet, Rfl: 2   diphenoxylate-atropine (LOMOTIL) 2.5-0.025 MG tablet, Take 1 tablet by mouth 4 (four) times daily as needed for diarrhea or loose stools., Disp: 30 tablet, Rfl: 0   DULoxetine (CYMBALTA) 60 MG capsule, Take 1 capsule (60 mg total) by mouth daily., Disp: 60 capsule, Rfl: 2   famotidine (PEPCID) 20 MG tablet, Take 1 tablet (20 mg total) by mouth 2 (two) times daily., Disp: 120 tablet, Rfl: 2   Fingerstix Lancets MISC, Use as directed  up to 4 times daily., Disp: 100 each, Rfl: 0   fluticasone (FLONASE) 50 MCG/ACT nasal spray, Place 2 sprays into both nostrils daily., Disp: 16 g, Rfl: 0   furosemide (LASIX) 40 MG tablet, Take 1 tablet (40 mg total) by mouth daily., Disp: 60 tablet, Rfl: 2   gabapentin (NEURONTIN) 300 MG capsule, TAKE 2 CAPSULES IN THE MORNING AND 1 CAPSULE MIDDAY AND 2 CAPSULES EVERY EVENING., Disp: 150 capsule, Rfl: 1   glucose blood test strip, Use four times daily as directed., Disp: 100 each, Rfl: 0   hydrOXYzine (ATARAX) 25 MG  tablet, TAKE 1 TABLET (25 MG TOTAL) BY MOUTH 3 (THREE) TIMES DAILY AS NEEDED. (AM+NOON+BEDTIME), Disp: 90 tablet, Rfl: 0   insulin aspart protamine - aspart (NOVOLOG MIX 70/30 FLEXPEN) (70-30) 100 UNIT/ML FlexPen, INJECT 45 UNITS INTO THE SKIN 2 (TWO) TIMES DAILY WITH A MEAL., Disp: 30 mL, Rfl: 0   Insulin Pen Needle (UNIFINE PENTIPS) 31G X 5 MM MISC, use with insulin pen, Disp: 100 each, Rfl: 1   metFORMIN (GLUCOPHAGE) 500 MG tablet, TAKE 2 TABLETS (1,000 MG TOTAL) BY MOUTH 2 (TWO) TIMES DAILY WITH A MEAL., Disp: 120 tablet, Rfl: 2   Multiple Vitamins-Minerals (PRESERVISION AREDS 2) CAPS, Take 1 capsule by mouth 2 (two) times daily with food. Take 1 in the morning and 1 in the evening., Disp: 120 capsule, Rfl: 6   nicotine (NICODERM CQ - DOSED IN MG/24 HOURS) 21 mg/24hr patch, Place 1 patch (21 mg total) onto the skin daily., Disp: 28 patch, Rfl: 0   nicotine polacrilex (NICORETTE MINI) 4 MG lozenge, Use 3 times a day to quit smoking, Disp: 100 tablet, Rfl: 4   olopatadine (PATANOL) 0.1 % ophthalmic solution, Place 1 drop into the right eye 2 (two) times daily., Disp: 5 mL, Rfl: 12   polyethylene glycol powder (GLYCOLAX/MIRALAX) 17 GM/SCOOP powder, Take 17 g by mouth daily as needed for moderate constipation or severe constipation., Disp: 238 g, Rfl: 0   risperidone (RISPERDAL) 4 MG tablet, Take 1 tablet (4 mg total) by mouth once daily., Disp: 60 tablet, Rfl: 2   SYMBICORT 160-4.5 MCG/ACT inhaler, INHALE 2 PUFFS INTO THE LUNGS 2 (TWO) TIMES DAILY., Disp: 10.2 g, Rfl: 0   tamsulosin (FLOMAX) 0.4 MG CAPS capsule, Take 1 capsule (0.4 mg total) by mouth daily., Disp: 60 capsule, Rfl: 2   TRADJENTA 5 MG TABS tablet, TAKE 1 TABLET (5 MG TOTAL) BY MOUTH DAILY., Disp: 30 tablet, Rfl: 2   VENTOLIN HFA 108 (90 Base) MCG/ACT inhaler, INHALE 2 PUFFS INTO THE LUNGS EVERY 6 HOURS AS NEEDED FOR WHEEZING OR SHORTNESS OF BREATH., Disp: 18 g, Rfl: 0 No Known Allergies   Social History   Socioeconomic History    Marital status: Single    Spouse name: Not on file   Number of children: Not on file   Years of education: Not on file   Highest education level: Not on file  Occupational History   Not on file  Tobacco Use   Smoking status: Every Day    Packs/day: 1    Types: Cigarettes   Smokeless tobacco: Never  Vaping Use   Vaping Use: Never used  Substance and Sexual Activity   Alcohol use: Not Currently   Drug use: Never   Sexual activity: Not on file  Other Topics Concern   Not on file  Social History Narrative   Not on file   Social Determinants of Health   Financial Resource Strain: Not  on file  Food Insecurity: Food Insecurity Present (05/05/2022)   Hunger Vital Sign    Worried About Running Out of Food in the Last Year: Often true    Ran Out of Food in the Last Year: Often true  Transportation Needs: Not on file  Physical Activity: Not on file  Stress: Not on file  Social Connections: Not on file  Intimate Partner Violence: Not on file    Physical Exam      No future appointments.   ACTION: Home visit completed

## 2022-06-03 ENCOUNTER — Telehealth: Payer: Self-pay | Admitting: Pharmacist

## 2022-06-03 ENCOUNTER — Other Ambulatory Visit: Payer: Self-pay

## 2022-06-03 MED ORDER — DEXCOM G7 SENSOR MISC
6 refills | Status: DC
  Filled 2022-06-03: qty 3, 30d supply, fill #0

## 2022-06-03 MED ORDER — DEXCOM G7 RECEIVER DEVI
0 refills | Status: DC
  Filled 2022-06-03: qty 1, 90d supply, fill #0

## 2022-06-03 NOTE — Telephone Encounter (Signed)
This patient is not on two different insulin but is on BID Novolog mix. Do you think we could submit a PA for approval? Rxns sent to our pharmacy.

## 2022-06-08 ENCOUNTER — Telehealth: Payer: Self-pay

## 2022-06-08 NOTE — Telephone Encounter (Signed)
Patient no longer has [facility]

## 2022-06-08 NOTE — Telephone Encounter (Signed)
Herbert Seta-  I spoke to [place], customer service/  [employer] regarding PCS.  She confirmed that he has [facility] effective 04/11/2022 and will need a new PCS referral.  She said that the [facility] referral did not automatically carry over.  I will need to schedule him for an appt with Dr Delford Field because he has not been seen in the past 90 days.  We also need his [facility] ID number, [email or web address] is not permitted to give that information to me.

## 2022-06-10 ENCOUNTER — Other Ambulatory Visit: Payer: Self-pay

## 2022-06-20 NOTE — Progress Notes (Signed)
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     Pharynx: Oropharynx is clear. No oropharyngeal exudate.  Eyes:     General: Lids are normal. Lids are everted, no foreign bodies appreciated. No scleral icterus.       Right eye: No discharge.        Left eye: No discharge.     Conjunctiva/sclera:     Right eye: Right conjunctiva is injected.     Pupils: Pupils are equal, round, and reactive to light.     Comments: Conjunctivitis right eye resolved  Neck:     Thyroid: No thyromegaly.     Vascular: No carotid bruit or JVD.     Trachea: Trachea normal. No tracheal tenderness or tracheal deviation.  Cardiovascular:     Rate and Rhythm: Normal rate and regular rhythm.     Chest Wall: PMI is not displaced.     Pulses: Normal pulses. No decreased pulses.     Heart sounds: S1 normal and S2 normal. Heart sounds are distant. No murmur heard.    No systolic murmur is present.     No diastolic murmur is present.     No friction rub. No gallop. No S3 or  S4 sounds.  Pulmonary:     Effort: Pulmonary effort is normal. No tachypnea, accessory muscle usage or respiratory distress.     Breath sounds: Normal breath sounds. No stridor. No decreased breath sounds, wheezing, rhonchi or rales.     Comments: Markedly improved breath sounds no wheezes or rales or rhonchi Chest:     Chest wall: No tenderness.  Abdominal:     General: Bowel sounds are normal. There is no distension.     Palpations: Abdomen is soft. Abdomen is not rigid.     Tenderness: There is abdominal tenderness. There is no guarding or rebound.     Comments: Mild tenderness epigastric area  Musculoskeletal:        General: Normal range of motion.     Cervical back: Normal range of motion and neck supple. No edema, erythema or rigidity. No muscular tenderness. Normal range of motion.     Right lower leg: No edema.     Left lower leg: No edema.     Comments: Edema in both lower extremities markedly improved  Lymphadenopathy:     Head:     Right side of head: No submental or submandibular adenopathy.     Left side of head: No submental or submandibular adenopathy.     Cervical: No cervical adenopathy.  Skin:    General: Skin is warm and dry.     Coloration: Skin is not pale.     Findings: No rash.     Nails: There is no clubbing.  Neurological:     Mental Status: He is alert and oriented to person, place, and time.     Sensory: No sensory deficit.  Psychiatric:        Attention and Perception: Attention normal. He does not perceive auditory hallucinations.  Mood and Affect: Mood is depressed.        Speech: Speech normal. Speech is not delayed.        Behavior: Behavior normal. Behavior is not slowed or withdrawn. Behavior is cooperative.        Thought Content: Thought content normal. Thought content is not paranoid or delusional. Thought content does not include homicidal or suicidal ideation. Thought content does not include homicidal or suicidal plan.         Cognition and Memory: Cognition is impaired. He exhibits impaired recent memory.        Judgment: Judgment is not impulsive or inappropriate.     BP 116/82 (BP Location: Left Arm, Patient Position: Sitting, Cuff Size: Normal)   Pulse 93   Temp 98 F (36.7 C) (Oral)   Ht 5\' 8"  (1.727 m)   Wt (!) 369 lb (167.4 kg)   SpO2 95%   BMI 56.11 kg/m  Wt Readings from Last 3 Encounters:  06/23/22 (!) 369 lb (167.4 kg)  02/03/22 (!) 386 lb (175.1 kg)  10/27/21 (!) 379 lb 9.6 oz (172.2 kg)     Health Maintenance Due  Topic Date Due   COVID-19 Vaccine (3 - 2023-24 season) 09/10/2021   OPHTHALMOLOGY EXAM  02/17/2022   FOOT EXAM  03/24/2022    There are no preventive care reminders to display for this patient.  Lab Results  Component Value Date   TSH 1.888 07/27/2021   Lab Results  Component Value Date   WBC 13.3 (H) 06/23/2022   HGB 16.9 06/23/2022   HCT 52.2 (H) 06/23/2022   MCV 84 06/23/2022   PLT 211 06/23/2022   Lab Results  Component Value Date   NA 144 06/23/2022   K 4.5 06/23/2022   CO2 26 06/23/2022   GLUCOSE 108 (H) 06/23/2022   BUN 12 06/23/2022   CREATININE 0.91 06/23/2022   BILITOT 0.5 06/23/2022   ALKPHOS 93 06/23/2022   AST 10 06/23/2022   ALT 9 06/23/2022   PROT 6.7 06/23/2022   ALBUMIN 3.9 (L) 06/23/2022   CALCIUM 8.8 06/23/2022   ANIONGAP 7 07/30/2021   EGFR 103 06/23/2022   Lab Results  Component Value Date   CHOL 114 06/23/2022   Lab Results  Component Value Date   HDL 29 (L) 06/23/2022   Lab Results  Component Value Date   LDLCALC 59 06/23/2022   Lab Results  Component Value Date   TRIG 150 (H) 06/23/2022   Lab Results  Component Value Date   CHOLHDL 3.9 06/23/2022   Lab Results  Component Value Date   HGBA1C 7.3 (A) 06/23/2022      Assessment & Plan:   Problem List Items Addressed This Visit       Cardiovascular and Mediastinum   Chronic diastolic heart failure (HCC)    Compensated diastolic heart failure electrolytes  obtained today are normal renal function normal  No change in medications continue with furosemide daily      Relevant Medications   atorvastatin (LIPITOR) 10 MG tablet   furosemide (LASIX) 40 MG tablet   Essential hypertension    Hypertension under good control with use of Farxiga and furosemide no additional changes made electrolytes normal      Relevant Medications   atorvastatin (LIPITOR) 10 MG tablet   furosemide (LASIX) 40 MG tablet   RESOLVED: Hypertension associated with diabetes (HCC)   Relevant Medications   atorvastatin (LIPITOR) 10 MG tablet   dapagliflozin propanediol (FARXIGA) 10 MG TABS tablet  furosemide (LASIX) 40 MG tablet   insulin aspart protamine - aspart (NOVOLOG MIX 70/30 FLEXPEN) (70-30) 100 UNIT/ML FlexPen   metFORMIN (GLUCOPHAGE) 500 MG tablet   linagliptin (TRADJENTA) 5 MG TABS tablet   Other Relevant Orders   CBC with Differential/Platelet (Completed)     Respiratory   COPD with chronic bronchitis    Continue inhaled medications      Relevant Medications   SYMBICORT 160-4.5 MCG/ACT inhaler   VENTOLIN HFA 108 (90 Base) MCG/ACT inhaler   OSA/OHS    Continues to use CPAP at night        Digestive   GERD (gastroesophageal reflux disease)    Stable      Relevant Medications   CREON 12000-38000 units CPEP capsule   famotidine (PEPCID) 20 MG tablet   Exocrine pancreatic insufficiency    Continue with Creon        Endocrine   Type 2 diabetes mellitus with hyperglycemia, with long-term current use of insulin (HCC)    A1c now at goal on metformin and Farxiga and insulin continue same      Relevant Medications   atorvastatin (LIPITOR) 10 MG tablet   dapagliflozin propanediol (FARXIGA) 10 MG TABS tablet   insulin aspart protamine - aspart (NOVOLOG MIX 70/30 FLEXPEN) (70-30) 100 UNIT/ML FlexPen   metFORMIN (GLUCOPHAGE) 500 MG tablet   linagliptin (TRADJENTA) 5 MG TABS tablet     Other   Anxiety and depression    Continue with  duloxetine      Relevant Medications   DULoxetine (CYMBALTA) 60 MG capsule   hydrOXYzine (ATARAX) 25 MG tablet   Other Visit Diagnoses     Type 2 diabetes mellitus with hyperglycemia, without long-term current use of insulin (HCC)    -  Primary   Relevant Medications   atorvastatin (LIPITOR) 10 MG tablet   dapagliflozin propanediol (FARXIGA) 10 MG TABS tablet   insulin aspart protamine - aspart (NOVOLOG MIX 70/30 FLEXPEN) (70-30) 100 UNIT/ML FlexPen   metFORMIN (GLUCOPHAGE) 500 MG tablet   linagliptin (TRADJENTA) 5 MG TABS tablet   Other Relevant Orders   POCT glucose (manual entry) (Completed)   POCT glycosylated hemoglobin (Hb A1C) (Completed)   Comprehensive metabolic panel (Completed)   Lipid panel (Completed)      Meds ordered this encounter  Medications   atorvastatin (LIPITOR) 10 MG tablet    Sig: Take 1 tablet (10 mg total) by mouth once daily.    Dispense:  90 tablet    Refill:  1   CREON 12000-38000 units CPEP capsule    Sig: Take 1 capsule (12,000 Units total) by mouth 3 (three) times daily.    Dispense:  270 capsule    Refill:  2   dapagliflozin propanediol (FARXIGA) 10 MG TABS tablet    Sig: Take 1 tablet (10 mg total) by mouth daily.    Dispense:  60 tablet    Refill:  2   DULoxetine (CYMBALTA) 60 MG capsule    Sig: Take 1 capsule (60 mg total) by mouth daily.    Dispense:  60 capsule    Refill:  2   famotidine (PEPCID) 20 MG tablet    Sig: Take 1 tablet (20 mg total) by mouth 2 (two) times daily.    Dispense:  120 tablet    Refill:  2   furosemide (LASIX) 40 MG tablet    Sig: Take 1 tablet (40 mg total) by mouth daily.    Dispense:  60 tablet  Refill:  2   gabapentin (NEURONTIN) 300 MG capsule    Sig: TAKE 2 CAPSULES IN THE MORNING AND 1 CAPSULE MIDDAY AND 2 CAPSULES EVERY EVENING. (2AM+1N+2PM)    Dispense:  150 capsule    Refill:  1   hydrOXYzine (ATARAX) 25 MG tablet    Sig: TAKE 1 TABLET (25 MG TOTAL) BY MOUTH 3 (THREE) TIMES DAILY AS NEEDED.  (AM+NOON+BEDTIME)    Dispense:  90 tablet    Refill:  0    Must have office visit for refills   insulin aspart protamine - aspart (NOVOLOG MIX 70/30 FLEXPEN) (70-30) 100 UNIT/ML FlexPen    Sig: INJECT 45 UNITS INTO THE SKIN 2 (TWO) TIMES DAILY WITH A MEAL.    Dispense:  30 mL    Refill:  2   metFORMIN (GLUCOPHAGE) 500 MG tablet    Sig: Take 2 tablets (1,000 mg total) by mouth 2 (two) times daily with a meal.    Dispense:  120 tablet    Refill:  2   risperidone (RISPERDAL) 4 MG tablet    Sig: Take 1 tablet (4 mg total) by mouth once daily.    Dispense:  60 tablet    Refill:  2   SYMBICORT 160-4.5 MCG/ACT inhaler    Sig: Inhale 2 puffs into the lungs 2 (two) times daily.    Dispense:  10.2 g    Refill:  0   tamsulosin (FLOMAX) 0.4 MG CAPS capsule    Sig: Take 1 capsule (0.4 mg total) by mouth daily.    Dispense:  60 capsule    Refill:  2   linagliptin (TRADJENTA) 5 MG TABS tablet    Sig: Take 1 tablet (5 mg total) by mouth daily.    Dispense:  30 tablet    Refill:  2   VENTOLIN HFA 108 (90 Base) MCG/ACT inhaler    Sig: Inhale 2 puffs into the lungs every 6 (six) hours as needed for wheezing or shortness of breath.    Dispense:  18 g    Refill:  0  38 minutes spent complex decision making  Follow-up: Follow up with Dr. Delford Field in 4 months.    Shan Levans, MD

## 2022-06-21 ENCOUNTER — Telehealth (HOSPITAL_COMMUNITY): Payer: Self-pay

## 2022-06-21 ENCOUNTER — Other Ambulatory Visit: Payer: Self-pay | Admitting: Critical Care Medicine

## 2022-06-21 MED ORDER — HYDROXYZINE HCL 25 MG PO TABS: ORAL_TABLET | ORAL | 0 refills | Status: DC

## 2022-06-21 NOTE — Telephone Encounter (Signed)
[facility] is needing refill for Hydroxyzine for his bubble packs to be completed. Please send if possible- thanks!   Maralyn Sago, EMT-Paramedic [phone] 06/21/2022

## 2022-06-21 NOTE — Addendum Note (Signed)
Addended by: Lois Huxley, Jeannett Senior L on: 06/21/2022 12:03 PM   Modules accepted: Orders

## 2022-06-22 ENCOUNTER — Other Ambulatory Visit (HOSPITAL_COMMUNITY): Payer: Self-pay | Admitting: Critical Care Medicine

## 2022-06-22 ENCOUNTER — Telehealth (HOSPITAL_COMMUNITY): Payer: Self-pay

## 2022-06-22 NOTE — Telephone Encounter (Signed)
Called and confirmed appointment with Rudell Cobb for tomorrows clinic visit. He has a ride through [facility] and knows where to report to. Call complete.   Maralyn Sago, EMT-Paramedic [phone] 06/22/2022

## 2022-06-23 ENCOUNTER — Encounter: Payer: Self-pay | Admitting: Critical Care Medicine

## 2022-06-23 ENCOUNTER — Other Ambulatory Visit (HOSPITAL_COMMUNITY): Payer: Self-pay

## 2022-06-23 ENCOUNTER — Ambulatory Visit: Payer: Medicaid Other | Attending: Critical Care Medicine | Admitting: Critical Care Medicine

## 2022-06-23 VITALS — BP 116/82 | HR 93 | Temp 98.0°F | Ht 68.0 in | Wt 369.0 lb

## 2022-06-23 DIAGNOSIS — I10 Essential (primary) hypertension: Secondary | ICD-10-CM | POA: Diagnosis not present

## 2022-06-23 DIAGNOSIS — K219 Gastro-esophageal reflux disease without esophagitis: Secondary | ICD-10-CM

## 2022-06-23 DIAGNOSIS — H109 Unspecified conjunctivitis: Secondary | ICD-10-CM | POA: Insufficient documentation

## 2022-06-23 DIAGNOSIS — I5033 Acute on chronic diastolic (congestive) heart failure: Secondary | ICD-10-CM | POA: Insufficient documentation

## 2022-06-23 DIAGNOSIS — A4189 Other specified sepsis: Secondary | ICD-10-CM | POA: Diagnosis not present

## 2022-06-23 DIAGNOSIS — F259 Schizoaffective disorder, unspecified: Secondary | ICD-10-CM | POA: Insufficient documentation

## 2022-06-23 DIAGNOSIS — I152 Hypertension secondary to endocrine disorders: Secondary | ICD-10-CM

## 2022-06-23 DIAGNOSIS — J42 Unspecified chronic bronchitis: Secondary | ICD-10-CM | POA: Insufficient documentation

## 2022-06-23 DIAGNOSIS — Z720 Tobacco use: Secondary | ICD-10-CM | POA: Insufficient documentation

## 2022-06-23 DIAGNOSIS — Z794 Long term (current) use of insulin: Secondary | ICD-10-CM | POA: Insufficient documentation

## 2022-06-23 DIAGNOSIS — Z5900 Homelessness unspecified: Secondary | ICD-10-CM | POA: Insufficient documentation

## 2022-06-23 DIAGNOSIS — K8681 Exocrine pancreatic insufficiency: Secondary | ICD-10-CM

## 2022-06-23 DIAGNOSIS — I5032 Chronic diastolic (congestive) heart failure: Secondary | ICD-10-CM

## 2022-06-23 DIAGNOSIS — E1159 Type 2 diabetes mellitus with other circulatory complications: Secondary | ICD-10-CM | POA: Diagnosis not present

## 2022-06-23 DIAGNOSIS — G4733 Obstructive sleep apnea (adult) (pediatric): Secondary | ICD-10-CM

## 2022-06-23 DIAGNOSIS — E1165 Type 2 diabetes mellitus with hyperglycemia: Secondary | ICD-10-CM | POA: Diagnosis not present

## 2022-06-23 DIAGNOSIS — I110 Hypertensive heart disease with heart failure: Secondary | ICD-10-CM | POA: Diagnosis not present

## 2022-06-23 DIAGNOSIS — F32A Depression, unspecified: Secondary | ICD-10-CM

## 2022-06-23 DIAGNOSIS — F419 Anxiety disorder, unspecified: Secondary | ICD-10-CM

## 2022-06-23 DIAGNOSIS — R9431 Abnormal electrocardiogram [ECG] [EKG]: Secondary | ICD-10-CM | POA: Insufficient documentation

## 2022-06-23 DIAGNOSIS — J189 Pneumonia, unspecified organism: Secondary | ICD-10-CM | POA: Diagnosis not present

## 2022-06-23 DIAGNOSIS — J4489 Other specified chronic obstructive pulmonary disease: Secondary | ICD-10-CM

## 2022-06-23 LAB — POCT GLYCOSYLATED HEMOGLOBIN (HGB A1C): HbA1c, POC (controlled diabetic range): 7.3 % — AB (ref 0.0–7.0)

## 2022-06-23 LAB — GLUCOSE, POCT (MANUAL RESULT ENTRY): POC Glucose: 146 mg/dl — AB (ref 70–99)

## 2022-06-23 MED ORDER — CREON 12000-38000 UNITS PO CPEP: 12000.0000 [IU] | ORAL_CAPSULE | Freq: Three times a day (TID) | ORAL | 2 refills | Status: DC

## 2022-06-23 MED ORDER — DULOXETINE HCL 60 MG PO CPEP: 60.0000 mg | ORAL_CAPSULE | Freq: Every day | ORAL | 2 refills | Status: DC

## 2022-06-23 MED ORDER — METFORMIN HCL 500 MG PO TABS: 1000.0000 mg | ORAL_TABLET | Freq: Two times a day (BID) | ORAL | 2 refills | Status: DC

## 2022-06-23 MED ORDER — DAPAGLIFLOZIN PROPANEDIOL 10 MG PO TABS: 10.0000 mg | ORAL_TABLET | Freq: Every day | ORAL | 2 refills | Status: DC

## 2022-06-23 MED ORDER — FUROSEMIDE 40 MG PO TABS: 40.0000 mg | ORAL_TABLET | Freq: Every day | ORAL | 2 refills | Status: DC

## 2022-06-23 MED ORDER — ATORVASTATIN CALCIUM 10 MG PO TABS: 10.0000 mg | ORAL_TABLET | Freq: Every day | ORAL | 1 refills | Status: DC

## 2022-06-23 MED ORDER — LINAGLIPTIN 5 MG PO TABS: 5.0000 mg | ORAL_TABLET | Freq: Every day | ORAL | 2 refills | Status: DC

## 2022-06-23 MED ORDER — RISPERIDONE 4 MG PO TABS: 4.0000 mg | ORAL_TABLET | Freq: Every day | ORAL | 2 refills | Status: DC

## 2022-06-23 MED ORDER — NOVOLOG MIX 70/30 FLEXPEN (70-30) 100 UNIT/ML ~~LOC~~ SUPN: PEN_INJECTOR | SUBCUTANEOUS | 2 refills | Status: DC

## 2022-06-23 MED ORDER — VENTOLIN HFA 108 (90 BASE) MCG/ACT IN AERS: 2.0000 | INHALATION_SPRAY | Freq: Four times a day (QID) | RESPIRATORY_TRACT | 0 refills | Status: DC | PRN

## 2022-06-23 MED ORDER — HYDROXYZINE HCL 25 MG PO TABS: ORAL_TABLET | ORAL | 0 refills | Status: DC

## 2022-06-23 MED ORDER — SYMBICORT 160-4.5 MCG/ACT IN AERO: 2.0000 | INHALATION_SPRAY | Freq: Two times a day (BID) | RESPIRATORY_TRACT | 0 refills | Status: DC

## 2022-06-23 MED ORDER — FAMOTIDINE 20 MG PO TABS: 20.0000 mg | ORAL_TABLET | Freq: Two times a day (BID) | ORAL | 2 refills | Status: DC

## 2022-06-23 MED ORDER — TAMSULOSIN HCL 0.4 MG PO CAPS: 0.4000 mg | ORAL_CAPSULE | Freq: Every day | ORAL | 2 refills | Status: DC

## 2022-06-23 MED ORDER — GABAPENTIN 300 MG PO CAPS: ORAL_CAPSULE | ORAL | 1 refills | Status: DC

## 2022-06-23 NOTE — Patient Instructions (Signed)
No change in medications refill sent to [facility] pharmacy  Complete set of labs obtained at this visit  Excellent job at improving your blood pressure and diabetes  Dr. Delford Field will get you the extra-large depends sent to your home   We will try to get the personal care services resumed  Return to Dr. Delford Field 4 months

## 2022-06-23 NOTE — Progress Notes (Signed)
Paramedicine Encounter  Patient ID: Tome Wilson, male, DOB: 1970/09/15, 52 y.o.,  MRN: [id number]  Met patient in clinic today with Dr. Delford Field- no med changes made today. Dr. Delford Field is impressed with improvements in BP and A1c. Ruhan has been compliant with meds/ bubble packs and insulin. He is checking sugar at home. May benefit from dexcom or freestyle.   Abubakr given food [employer] today. Erskine Squibb is updating PCS referral.  I will see him in two weeks in the home.   Weight @ clinic-369lbs B/P-116/82 P-93 SP02-95%   Med changes: Peyton Bottoms, EMT-Paramedic [phone] 06/23/2022

## 2022-06-24 LAB — COMPREHENSIVE METABOLIC PANEL
ALT: 9 IU/L (ref 0–44)
AST: 10 IU/L (ref 0–40)
Albumin/Globulin Ratio: 1.4
Albumin: 3.9 g/dL — ABNORMAL LOW (ref 4.1–5.1)
Alkaline Phosphatase: 93 IU/L (ref 44–121)
BUN/Creatinine Ratio: 13 (ref 9–20)
BUN: 12 mg/dL (ref 6–24)
Bilirubin Total: 0.5 mg/dL (ref 0.0–1.2)
CO2: 26 mmol/L (ref 20–29)
Calcium: 8.8 mg/dL (ref 8.7–10.2)
Chloride: 103 mmol/L (ref 96–106)
Creatinine, Ser: 0.91 mg/dL (ref 0.76–1.27)
Globulin, Total: 2.8 g/dL (ref 1.5–4.5)
Glucose: 108 mg/dL — ABNORMAL HIGH (ref 70–99)
Potassium: 4.5 mmol/L (ref 3.5–5.2)
Sodium: 144 mmol/L (ref 134–144)
Total Protein: 6.7 g/dL (ref 6.0–8.5)
eGFR: 103 mL/min/{1.73_m2} (ref >59)

## 2022-06-24 LAB — CBC WITH DIFFERENTIAL/PLATELET
Basophils Absolute: 0.1 10*3/uL (ref 0.0–0.2)
Basos: 1 %
EOS (ABSOLUTE): 0.3 10*3/uL (ref 0.0–0.4)
Eos: 2 %
Hematocrit: 52.2 % — ABNORMAL HIGH (ref 37.5–51.0)
Hemoglobin: 16.9 g/dL (ref 13.0–17.7)
Immature Grans (Abs): 0.1 10*3/uL (ref 0.0–0.1)
Immature Granulocytes: 1 %
Lymphocytes Absolute: 2.4 10*3/uL (ref 0.7–3.1)
Lymphs: 18 %
MCH: 27.1 pg (ref 26.6–33.0)
MCHC: 32.4 g/dL (ref 31.5–35.7)
MCV: 84 fL (ref 79–97)
Monocytes Absolute: 0.9 10*3/uL (ref 0.1–0.9)
Monocytes: 7 %
Neutrophils Absolute: 9.5 10*3/uL — ABNORMAL HIGH (ref 1.4–7.0)
Neutrophils: 71 %
Platelets: 211 10*3/uL (ref 150–450)
RBC: 6.24 x10E6/uL — ABNORMAL HIGH (ref 4.14–5.80)
RDW: 17.2 % — ABNORMAL HIGH (ref 11.6–15.4)
WBC: 13.3 10*3/uL — ABNORMAL HIGH (ref 3.4–10.8)

## 2022-06-24 LAB — LIPID PANEL
Chol/HDL Ratio: 3.9 ratio (ref 0.0–5.0)
Cholesterol, Total: 114 mg/dL (ref 100–199)
HDL: 29 mg/dL — ABNORMAL LOW (ref >39)
LDL Chol Calc (NIH): 59 mg/dL (ref 0–99)
Triglycerides: 150 mg/dL — ABNORMAL HIGH (ref 0–149)
VLDL Cholesterol Cal: 26 mg/dL (ref 5–40)

## 2022-06-24 NOTE — Assessment & Plan Note (Signed)
Stable

## 2022-06-24 NOTE — Assessment & Plan Note (Signed)
Continue with duloxetine.  

## 2022-06-24 NOTE — Assessment & Plan Note (Signed)
Hypertension under good control with use of Farxiga and furosemide no additional changes made electrolytes normal

## 2022-06-24 NOTE — Assessment & Plan Note (Signed)
A1c now at goal on metformin and Farxiga and insulin continue same

## 2022-06-24 NOTE — Assessment & Plan Note (Signed)
Continue inhaled medications 

## 2022-06-24 NOTE — Assessment & Plan Note (Signed)
Continue with Creon

## 2022-06-24 NOTE — Assessment & Plan Note (Signed)
Compensated diastolic heart failure electrolytes obtained today are normal renal function normal  No change in medications continue with furosemide daily

## 2022-06-24 NOTE — Assessment & Plan Note (Signed)
Continues to use CPAP at night

## 2022-06-29 ENCOUNTER — Telehealth: Payer: Self-pay

## 2022-06-29 NOTE — Telephone Encounter (Addendum)
I spoke to [place], Provider Support - [facility]: [phone] who informed me that PCS requests are to be faxed to Utilization Management : [phone].  Referral then faxed to [facility]

## 2022-07-21 ENCOUNTER — Other Ambulatory Visit (HOSPITAL_COMMUNITY): Payer: Self-pay

## 2022-07-21 NOTE — Progress Notes (Signed)
Paramedicine Encounter    Patient ID: Raymond Hood, male    DOB: 02-03-1970, 52 y.o.   MRN: [id number]   Complaints - none   Assessment - CAOx4 ambulatory on scene. Pt had clear lung sounds. Chronic exertional shortness of breath, no recent increase. Pt appeared to be unbathed, unclean clothing, and unkempt appearance.   Compliance with meds - has been obviously compliant with bubble pack medications.  Pill box filled - 1x month worth of bubble packs delivered from [employer].   Refills needed - [facility] contacted.   Meds changes since last visit - None    Social changes - Pending disability.   Housing - House appeared to be infested with flies, trash can full, open food containers noted on the counter, dirty dishes in the sink.    BP 122/84   Pulse 100   Resp 18   SpO2 92%  CBG - 134  Patient Care Team: Storm Frisk, MD as PCP - General (Pulmonary Disease)  Patient Active Problem List   Diagnosis Date Noted   Anxiety and depression 07/14/2021   Essential hypertension 07/14/2021   Chronic diastolic heart failure (HCC) 01/14/2021   Type 2 diabetes mellitus with hyperglycemia, with long-term current use of insulin (HCC) 12/14/2020   GERD (gastroesophageal reflux disease) 10/15/2020   Tobacco abuse 10/15/2020   Renal mass, right 10/15/2020   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   Morbid obesity with BMI of 50.0-59.9, adult (HCC) 10/15/2020   Sheltered homelessness 10/15/2020   MDD (major depressive disorder), recurrent episode (HCC) 09/28/2020   Anxiety 09/28/2020   Schizo affective schizophrenia (HCC) 09/28/2020   Numbness and tingling in both hands 05/28/2020   Adrenal hyperplasia (HCC) 06/05/2019   Low back pain 08/29/2018   Multiple joint pain 08/29/2018   OSA/OHS 08/17/2018   Exocrine pancreatic insufficiency 07/02/2018   Asthma without status asthmaticus 03/27/2018   Epidermal inclusion cyst 07/20/2012   Psoriasis, unspecified  06/28/2012   Erectile dysfunction 06/28/2012   ADHD 11/17/1981    Current Outpatient Medications:    atorvastatin (LIPITOR) 10 MG tablet, Take 1 tablet (10 mg total) by mouth once daily., Disp: 90 tablet, Rfl: 1   Blood Glucose Monitoring Suppl (ACCU-CHEK GUIDE) w/Device KIT, Use up to four times daily as directed., Disp: 1 kit, Rfl: 0   Continuous Glucose Receiver (DEXCOM G7 RECEIVER) DEVI, Use to check blood sugar throughout the day., Disp: 1 each, Rfl: 0   Continuous Glucose Sensor (DEXCOM G7 SENSOR) MISC, Use to check blood sugar throughout the day., Disp: 3 each, Rfl: 6   CREON 12000-38000 units CPEP capsule, Take 1 capsule (12,000 Units total) by mouth 3 (three) times daily., Disp: 270 capsule, Rfl: 2   dapagliflozin propanediol (FARXIGA) 10 MG TABS tablet, Take 1 tablet (10 mg total) by mouth daily., Disp: 60 tablet, Rfl: 2   diphenoxylate-atropine (LOMOTIL) 2.5-0.025 MG tablet, Take 1 tablet by mouth 4 (four) times daily as needed for diarrhea or loose stools., Disp: 30 tablet, Rfl: 0   DULoxetine (CYMBALTA) 60 MG capsule, Take 1 capsule (60 mg total) by mouth daily., Disp: 60 capsule, Rfl: 2   EASY COMFORT PEN NEEDLES 31G X 5 MM MISC, USE AS DIRECTED WITH INSULIN PEN, Disp: 100 each, Rfl: 1   famotidine (PEPCID) 20 MG tablet, Take 1 tablet (20 mg total) by mouth 2 (two) times daily., Disp: 120 tablet, Rfl: 2   Fingerstix Lancets MISC, Use as directed up to 4 times daily., Disp: 100 each, Rfl: 0  fluticasone (FLONASE) 50 MCG/ACT nasal spray, Place 2 sprays into both nostrils daily., Disp: 16 g, Rfl: 0   furosemide (LASIX) 40 MG tablet, Take 1 tablet (40 mg total) by mouth daily., Disp: 60 tablet, Rfl: 2   gabapentin (NEURONTIN) 300 MG capsule, TAKE 2 CAPSULES IN THE MORNING AND 1 CAPSULE MIDDAY AND 2 CAPSULES EVERY EVENING. (2AM+1N+2PM), Disp: 150 capsule, Rfl: 1   glucose blood test strip, Use four times daily as directed., Disp: 100 each, Rfl: 0   hydrOXYzine (ATARAX) 25 MG tablet,  TAKE 1 TABLET (25 MG TOTAL) BY MOUTH 3 (THREE) TIMES DAILY AS NEEDED. (AM+NOON+BEDTIME), Disp: 90 tablet, Rfl: 0   insulin aspart protamine - aspart (NOVOLOG MIX 70/30 FLEXPEN) (70-30) 100 UNIT/ML FlexPen, INJECT 45 UNITS INTO THE SKIN 2 (TWO) TIMES DAILY WITH A MEAL., Disp: 30 mL, Rfl: 2   linagliptin (TRADJENTA) 5 MG TABS tablet, Take 1 tablet (5 mg total) by mouth daily., Disp: 30 tablet, Rfl: 2   metFORMIN (GLUCOPHAGE) 500 MG tablet, Take 2 tablets (1,000 mg total) by mouth 2 (two) times daily with a meal., Disp: 120 tablet, Rfl: 2   Multiple Vitamins-Minerals (PRESERVISION AREDS 2) CAPS, Take 1 capsule by mouth 2 (two) times daily with food. Take 1 in the morning and 1 in the evening., Disp: 120 capsule, Rfl: 6   polyethylene glycol powder (GLYCOLAX/MIRALAX) 17 GM/SCOOP powder, Take 17 g by mouth daily as needed for moderate constipation or severe constipation., Disp: 238 g, Rfl: 0   risperidone (RISPERDAL) 4 MG tablet, Take 1 tablet (4 mg total) by mouth once daily., Disp: 60 tablet, Rfl: 2   SYMBICORT 160-4.5 MCG/ACT inhaler, Inhale 2 puffs into the lungs 2 (two) times daily., Disp: 10.2 g, Rfl: 0   tamsulosin (FLOMAX) 0.4 MG CAPS capsule, Take 1 capsule (0.4 mg total) by mouth daily., Disp: 60 capsule, Rfl: 2   VENTOLIN HFA 108 (90 Base) MCG/ACT inhaler, Inhale 2 puffs into the lungs every 6 (six) hours as needed for wheezing or shortness of breath., Disp: 18 g, Rfl: 0   nicotine (NICODERM CQ - DOSED IN MG/24 HOURS) 21 mg/24hr patch, Place 1 patch (21 mg total) onto the skin daily. (Patient not taking: Reported on 07/21/2022), Disp: 28 patch, Rfl: 0   nicotine polacrilex (NICORETTE MINI) 4 MG lozenge, Use 3 times a day to quit smoking (Patient not taking: Reported on 07/21/2022), Disp: 100 tablet, Rfl: 4   olopatadine (PATANOL) 0.1 % ophthalmic solution, Place 1 drop into the right eye 2 (two) times daily. (Patient not taking: Reported on 07/21/2022), Disp: 5 mL, Rfl: 12 No Known  Allergies   Social History   Socioeconomic History   Marital status: Single    Spouse name: Not on file   Number of children: Not on file   Years of education: Not on file   Highest education level: Not on file  Occupational History   Not on file  Tobacco Use   Smoking status: Every Day    Current packs/day: 1.00    Types: Cigarettes   Smokeless tobacco: Never  Vaping Use   Vaping status: Never Used  Substance and Sexual Activity   Alcohol use: Not Currently   Drug use: Never   Sexual activity: Not on file  Other Topics Concern   Not on file  Social History Narrative   Not on file   Social Determinants of Health   Financial Resource Strain: High Risk (11/30/2020)   Received from [facility]  Overall Financial Resource Strain (CARDIA)    Difficulty of Paying Living Expenses: Very hard  Food Insecurity: Food Insecurity Present (05/05/2022)   Hunger Vital Sign    Worried About Running Out of Food in the Last Year: Often true    Ran Out of Food in the Last Year: Often true  Transportation Needs: Unmet Transportation Needs (11/30/2020)   Received from [facility] - Transportation    In the past 12 months, has lack of transportation kept you from medical appointments or from getting medications?: Yes    Lack of Transportation (Non-Medical): Yes  Physical Activity: Inactive (12/01/2020)   Received from [facility]   Exercise Vital Sign    Days of Exercise per Week: 0 days    Minutes of Exercise per Session: 0 min  Stress: Stress Concern Present (11/30/2020)   Received from [facility] - Occupational Stress Questionnaire    Feeling of Stress : Very much  Social Connections: Unknown (05/23/2021)   [facility] from [facility]   Social Network    Social Network: Not on file  Intimate Partner Violence: Unknown (04/14/2021)   Received from [facility]   HITS    Physically Hurt: Not on file    Insult or Talk Down To: Not on file    Threaten Physical Harm: Not on file    Scream or Curse: Not on file    Physical Exam I met with Raymond Hood in the home today. Akiva presented with chronic exertional shortness of breath, but denied any complaints, reporting that it has not worsened. Pt denied any chest pain or dizzy spells. PT was assessed and vitals obtained. I inspected his bubble packs to note that preservision was missing and contacted the pharmacy regarding same.Pt requested that we read his mail and noted a letter from the [employer] stating that they had received his application for disability. Pt reported repeated compliance with insulin. We provided pt with food pantry items. Home visit complete, will follow up in 1x month.      Phone appointment on Monday, July 22nd at 10am from [employer], pt aware of same.      ACTION: Home visit completed  Maralyn Sago, EMT-Paramedic [phone] 07/21/2022

## 2022-07-27 ENCOUNTER — Telehealth: Payer: Self-pay

## 2022-07-27 NOTE — Telephone Encounter (Signed)
Call received from Maralyn Sago, EMT stating that the patient informed her that his residence failed a home inspection today and he has been given until Tuesday, 08/02/2022 to to clean up the house or he will be issued an eviction notice.    Heather and I called the patient and I explained that I can refer him to [facility] [place] for assistance with his housing issues/ possible eviction.  I explained that there is no guarantee that they will be able to assist but we can try. He was in agreement to placing that referral. He confirmed that the best number to reach his is [phone].   His friend, Ree Kida, was in the background and said that they have a friend coming on Friday, 07/29/2022 to help clean up the house   Referral placed to Legal Aid.

## 2022-07-29 ENCOUNTER — Telehealth: Payer: Self-pay | Admitting: Critical Care Medicine

## 2022-07-29 NOTE — Telephone Encounter (Signed)
Copied from CRM [id number]. Topic: General - Other >> Jul 29, 2022  1:50 PM Phill Myron wrote: Eviction notice.. unable to reach Mr Gregory, [place]  [phone]   do you have a better number. Please call as soon as possible. Eviction starts Tuesday.

## 2022-08-02 NOTE — Telephone Encounter (Signed)
Email sent to Raymond Hood informing her that I just tried calling: [phone] and the voicemail is full.  This is the phone number that the patient provided.  I also called [phone] and was able to leave a message for the patient requesting he call me back.  I shared this phone number with [facility].  Voicemail message left for Maralyn Sago, EMT inquiring it there is another phone number to reach the patient.

## 2022-08-03 ENCOUNTER — Telehealth (HOSPITAL_COMMUNITY): Payer: Self-pay | Admitting: Emergency Medicine

## 2022-08-03 NOTE — Telephone Encounter (Signed)
I called :[phone] again and the voicemail is full.     I also called [phone] and was able to leave a message for the patient requesting he call me back.     Maralyn Sago, EMT, does not have another number for the patient.

## 2022-08-03 NOTE — Telephone Encounter (Signed)
Called Raymond Hood today on both his numbers without any answer. When I called [phone], his mailbox was full and unable to leave a message.   Call was in reference to a possible eviction to check on his housing status and safety, will continue to reach out.

## 2022-08-09 NOTE — Telephone Encounter (Signed)
Message received from [facility], [employer] [facility] [place] stating: Raymond Hood gave consent for [place] to to let you know we were attempting to assist, but he passed inspection and his tenancy is no longer in jeopardy.  He is all set and doesn't need any further help.

## 2022-08-11 ENCOUNTER — Other Ambulatory Visit: Payer: Self-pay | Admitting: Critical Care Medicine

## 2022-08-15 ENCOUNTER — Other Ambulatory Visit: Payer: Self-pay

## 2022-08-15 ENCOUNTER — Other Ambulatory Visit: Payer: Self-pay | Admitting: Critical Care Medicine

## 2022-08-16 ENCOUNTER — Telehealth (HOSPITAL_COMMUNITY): Payer: Self-pay

## 2022-08-16 ENCOUNTER — Other Ambulatory Visit: Payer: Self-pay | Admitting: Pharmacist

## 2022-08-16 ENCOUNTER — Other Ambulatory Visit: Payer: Self-pay

## 2022-08-16 MED ORDER — EMPAGLIFLOZIN 10 MG PO TABS: 10.0000 mg | ORAL_TABLET | Freq: Every day | ORAL | 3 refills | Status: DC

## 2022-08-16 NOTE — Telephone Encounter (Signed)
Requested Prescriptions  Pending Prescriptions Disp Refills   gabapentin (NEURONTIN) 300 MG capsule [Pharmacy Med Name: GABAPENTIN 300 MG ORAL CAPSULE] 150 capsule 1    Sig: TAKE 2 CAPSULES IN THE MORNING AND 1 CAPSULE MIDDAY AND 2 CAPSULES EVERY EVENING. (3KG+4W+1UU)     Neurology: Anticonvulsants - gabapentin Passed - 08/15/2022  1:58 PM      Passed - Cr in normal range and within 360 days    Creatinine, Ser  Date Value Ref Range Status  06/23/2022 0.91 0.76 - 1.27 mg/dL Final         Passed - Completed PHQ-2 or PHQ-9 in the last 360 days      Passed - Valid encounter within last 12 months    Recent Outpatient Visits           1 month ago Type 2 diabetes mellitus with hyperglycemia, without long-term current use of insulin [facility])   [facility] Storm Frisk, MD   6 months ago Type 2 diabetes mellitus with hyperglycemia, without long-term current use of insulin [facility]   [place] [facility] Storm Frisk, MD   1 year ago AKI (acute kidney injury) [facility])   [facility] Storm Frisk, MD   1 year ago Type 2 diabetes mellitus with hyperglycemia, without long-term current use of insulin [facility])   [facility] Storm Frisk, MD   1 year ago Type 2 diabetes mellitus with hyperglycemia, without long-term current use of insulin [facility])   [place] [facility] Storm Frisk, MD

## 2022-08-16 NOTE — Telephone Encounter (Signed)
I tried to reach NCLIFTSS to inquire about the status of the PCS referral  I was on hold for an extended period of time and had a to hang up.

## 2022-08-16 NOTE — Telephone Encounter (Signed)
Arnav contacted me in regards to having trouble getting his medications filled at [facility]- I contacted [facility] and they advised Jentzen is no longer covered by medicaid and that his primary insurance is showing a Acupuncturist. I contacted Brekyn about same and he was unaware this had happened and doesn't recall switching. I advised him to contact [employer] and cancel this plan and to call [employer] to ensure his plan is still active so [facility] can run his medications if he wishes to be covered under medicaid. I also advised him to follow up with Medicaid to ensure he is still covered. He reports he called and it was all squared away- I will forward to Barron Schmid to see if we can check Maud tracks to ensure his medicaid is still active. I will also let [facility] know once we've confirmed. Call complete.   Maralyn Sago, EMT-Paramedic [phone] 08/16/2022

## 2022-08-17 ENCOUNTER — Other Ambulatory Visit: Payer: Self-pay | Admitting: Pharmacist

## 2022-08-17 ENCOUNTER — Other Ambulatory Visit: Payer: Self-pay

## 2022-08-17 MED ORDER — SITAGLIPTIN PHOSPHATE 100 MG PO TABS
100.0000 mg | ORAL_TABLET | Freq: Every day | ORAL | 1 refills | Status: DC
  Filled 2022-08-17: qty 30, 30d supply, fill #0

## 2022-08-17 NOTE — Telephone Encounter (Signed)
I spoke to Jaz/NCLIFTSS and she said that they rejected the [facility] referral because he is now with [facility].  She said they then forwarded the referral to [facility].  She provided the contact number for [facility] Resources: [phone].  I then called [employer] and spoke to [place] and then [employer] had no information about PCS  and was only able to provide me with the email for their person who is in charge of referrals: [email or web address]. Call reference # [id number]. Email sent to [facility] requesting information about processing PCS referrals.    I also want to provide patient's current phone number: [phone]

## 2022-08-18 NOTE — Telephone Encounter (Signed)
I obtained the email address for submitting PCS forms from the [facility] website and submitted the PCS to [email or web address]

## 2022-08-22 ENCOUNTER — Telehealth (HOSPITAL_COMMUNITY): Payer: Self-pay | Admitting: Emergency Medicine

## 2022-08-22 NOTE — Telephone Encounter (Signed)
Per Cheryll Dessert, Referral coordinator, the patient's [facility] and Monia Pouch are both active

## 2022-08-22 NOTE — Telephone Encounter (Signed)
Tried contacting Raymond Hood to schedule a home visit and to follow up with his bubble pack delivery. (8/6 - Pharmacy reported changes in insurance which impacted his co-pay for bubble packs).

## 2022-08-23 ENCOUNTER — Other Ambulatory Visit: Payer: Self-pay

## 2022-08-24 ENCOUNTER — Telehealth (HOSPITAL_COMMUNITY): Payer: Self-pay | Admitting: Emergency Medicine

## 2022-08-24 NOTE — Telephone Encounter (Signed)
Spoke with Mr. Raymond Hood on the phone after missed attempt. Pt advised that he had been without medications for a few days, but asymptomatic of same, denying any complaints. Pt advised calling the pharmacy, asking for refills. [name] also requested refills 8/5.  Contacted [facility] and asked what was the problem, they advised that they did not receive the information to refill. Order was re placed and pill packs are set to be delivered Tomorrow AM.   Will follow up tomorrow.  Benson Setting EMT-P Community Paramedic  [phone]

## 2022-08-25 ENCOUNTER — Telehealth (HOSPITAL_COMMUNITY): Payer: Self-pay | Admitting: Emergency Medicine

## 2022-08-25 NOTE — Telephone Encounter (Signed)
Tried to contact Joud to ensure he received his pill pack today that was set to be delivered. There was no answer and pt's VM box is full and I was unable to leave a VM.   Will have H. Spencer to follow up next week on same.   Benson Setting EMT-P Community Paramedic  [phone]

## 2022-08-29 ENCOUNTER — Telehealth (HOSPITAL_COMMUNITY): Payer: Self-pay

## 2022-08-29 NOTE — Telephone Encounter (Signed)
Attempted to reach out to Mr. Turnbaugh to follow up on a paramedicine visit in the coming weeks and to see if he received his bubble packs. No answer- unable to leave a message. I will continue to reach out.   Maralyn Sago, EMT-Paramedic [phone] 08/29/2022

## 2022-09-05 ENCOUNTER — Telehealth (HOSPITAL_COMMUNITY): Payer: Self-pay | Admitting: Emergency Medicine

## 2022-09-05 NOTE — Telephone Encounter (Signed)
Spoke with Raymond Hood on the phone today and he advised that he had not yet received his pill packs from the pharmacy and had been without medications for at least 1x week.   Will follow up with [facility] and pt today regarding same.   Benson Setting EMT-P Community Paramedic  [phone]

## 2022-09-06 ENCOUNTER — Telehealth (HOSPITAL_COMMUNITY): Payer: Self-pay | Admitting: Emergency Medicine

## 2022-09-06 NOTE — Telephone Encounter (Signed)
Spoke with Mr. Fawcett about cancelling his [employer] so that he could afford his medication. Pt advised that he had, but records state otherwise. Pt requested assistance in calling and cancelling his insurance. [employer] was cancelled and will be terminated at midnight tonight.   Will follow up with pharmacy in the AM regarding his medciations.  Benson Setting EMT-P Community Paramedic  [phone]

## 2022-09-07 ENCOUNTER — Telehealth (HOSPITAL_COMMUNITY): Payer: Self-pay

## 2022-09-07 ENCOUNTER — Other Ambulatory Visit: Payer: Self-pay

## 2022-09-07 MED ORDER — HYDROXYZINE HCL 25 MG PO TABS: ORAL_TABLET | ORAL | 0 refills | Status: DC

## 2022-09-07 NOTE — Telephone Encounter (Signed)
Made [facility] aware of the update of Raymond Hood insurance. Alecia Lemming at [facility] aware and will re-run his medications under his medicaid today and prepare for delivery.  Maralyn Sago, EMT-Paramedic [phone] 09/07/2022

## 2022-09-07 NOTE — Addendum Note (Signed)
Addended by: Lois Huxley, Jeannett Senior L on: 09/07/2022 11:15 AM   Modules accepted: Orders

## 2022-09-07 NOTE — Telephone Encounter (Signed)
[facility] is requesting refill for hydroxyzine and reports Jardiance needs a Prior Auth. I will forward to [facility] & Dr. Delford Field for same.   Medicaid is working again and copays will be waived.   Maralyn Sago, EMT-Paramedic [phone] 09/07/2022

## 2022-09-07 NOTE — Telephone Encounter (Signed)
Hydroxyzine rxn sent. Looping Kelly in to initiate PA for [employer].

## 2022-09-08 ENCOUNTER — Telehealth (HOSPITAL_COMMUNITY): Payer: Self-pay | Admitting: Emergency Medicine

## 2022-09-08 NOTE — Telephone Encounter (Signed)
Called Raymond Hood today to ensure he received his medication and to schedule his home visit.   No answer and no ability to leave a message, will follow up next week.    Benson Setting EMT-P Community Paramedic  [phone]

## 2022-09-14 ENCOUNTER — Telehealth (HOSPITAL_COMMUNITY): Payer: Self-pay | Admitting: Emergency Medicine

## 2022-09-14 NOTE — Telephone Encounter (Signed)
Called to get in touch with Raymond Hood, was able to get him to answer the phone. Pt did advise that he had received his pill packs and that he had been taking them for the past few days. Advised I would like to follow up with him before the weeks end and he advised that would be fine.  Pt did advise me that he had a bed bug infestation and requested assistance on getting rid of them. Will reach out for more resources   Will follow up this week in the home.   Benson Setting EMT-P Community Paramedic  [phone]  '

## 2022-09-15 ENCOUNTER — Telehealth: Payer: Self-pay

## 2022-09-15 ENCOUNTER — Other Ambulatory Visit: Payer: Self-pay | Admitting: Emergency Medicine

## 2022-09-15 ENCOUNTER — Telehealth (HOSPITAL_COMMUNITY): Payer: Self-pay | Admitting: Emergency Medicine

## 2022-09-15 DIAGNOSIS — E1165 Type 2 diabetes mellitus with hyperglycemia: Secondary | ICD-10-CM

## 2022-09-15 NOTE — Telephone Encounter (Signed)
Pt had me look over his pill packs and Trajenta was in the packs. When speaking to the pharmacy, they advised that they did not have a prescription for Januvia, just Trajenta.   Can we confirm that pt is on Januvia and not Trajenta?  Thanks,  Benson Setting EMT-P Community Paramedic  [phone]

## 2022-09-15 NOTE — Progress Notes (Signed)
Paramedicine Encounter    Patient ID: Raymond Hood, male    DOB: August 03, 1970, 52 y.o.   MRN: [id number]   Complaints - Bed bugs   Assessment - soiled clothing, unwashed skin, lung sounds decreased, exertional shortness of breath.   Compliance with meds - been without medications for 2x weeks +, just started taking medications Monday but had missed several days of same. Reports hasn't taken his insulin or his medications yet today.   Pill box filled - Pill packs for 1 month were assessed. They had Trajenta instead of Januvia and did not have a prescription for same. Also, they were unable to add Preservision as it is an OTC medication.   Refills needed - DEXCOMS and Nicarette glucometer test strips.   Meds changes since last visit - none    Social changes - has acquired bed bugs and continues to not have an aid to help upkeep his apartment. Mr. Bennight has piles of trash stacked on the table and heavily soiling of the entire apartment. Advising that he smokes 8 cigarettes a day, proceeding to smoke 2 within the duration of the 40 minute visit.    VISIT SUMMARY**  Met with Mr. Borke today at his home. Pt has been without his medications for 2x weeks + due to an insurance problem. Pt signed up for an insurance that blocked him from using Medicaid. Pt had to cancel insurance before his medications could process due to the co-pay cost. Pt advised that he received his medications this week and began taking his pill pack on Monday. I did note 1 missed morning and 1 missed evening dose, and pt had taken none of his medications thus far today. Pt was evaluated as noted and found to be hypoxic, tachycardic, and hyperglycemic. Pt was encouraged to decrease his smoking and use his albuterol inhaler as he felt short of breath. Pt was advised to take his morning medications as soon as possible and his insulin. Pt advised that he would, but advised me to "Quit rushing him". Pt was educated on his medications  and pill packs assessed. I did note the discrepancy with Januvia, as it was filled as Trajenta instead of Januvia. Will reach out to [facility] for direction on same. Pharmacy also did not have a prescription for [facility], will have that transferred to [facility] next week.   BP (!) 130/0 Comment: palpated  Pulse (!) 106   Resp 18   SpO2 (!) 89% Comment: Pt smoked 2x cigarettes DURING our visit CBG: [phone]  Benson Setting EMT-P Community Paramedic  [phone]     ACTION: Home visit completed     Patient Care Team: Storm Frisk, MD as PCP - General (Pulmonary Disease)  Patient Active Problem List   Diagnosis Date Noted   Anxiety and depression 07/14/2021   Essential hypertension 07/14/2021   Chronic diastolic heart failure (HCC) 01/14/2021   Type 2 diabetes mellitus with hyperglycemia, with long-term current use of insulin (HCC) 12/14/2020   GERD (gastroesophageal reflux disease) 10/15/2020   Tobacco abuse 10/15/2020   Renal mass, right 10/15/2020   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   Morbid obesity with BMI of 50.0-59.9, adult (HCC) 10/15/2020   Sheltered homelessness 10/15/2020   MDD (major depressive disorder), recurrent episode (HCC) 09/28/2020   Anxiety 09/28/2020   Schizo affective schizophrenia (HCC) 09/28/2020   Numbness and tingling in both hands 05/28/2020   Adrenal hyperplasia (HCC) 06/05/2019   Low back pain 08/29/2018   Multiple joint pain 08/29/2018  OSA/OHS 08/17/2018   Exocrine pancreatic insufficiency 07/02/2018   Asthma without status asthmaticus 03/27/2018   Epidermal inclusion cyst 07/20/2012   Psoriasis, unspecified 06/28/2012   Erectile dysfunction 06/28/2012   ADHD 11/17/1981    Current Outpatient Medications:    atorvastatin (LIPITOR) 10 MG tablet, Take 1 tablet (10 mg total) by mouth once daily., Disp: 90 tablet, Rfl: 1   Blood Glucose Monitoring Suppl (ACCU-CHEK GUIDE) w/Device KIT, Use up to four times daily as directed., Disp: 1  kit, Rfl: 0   Continuous Glucose Receiver (DEXCOM G7 RECEIVER) DEVI, Use to check blood sugar throughout the day., Disp: 1 each, Rfl: 0   Continuous Glucose Sensor (DEXCOM G7 SENSOR) MISC, Use to check blood sugar throughout the day., Disp: 3 each, Rfl: 6   CREON 12000-38000 units CPEP capsule, Take 1 capsule (12,000 Units total) by mouth 3 (three) times daily., Disp: 270 capsule, Rfl: 2   diphenoxylate-atropine (LOMOTIL) 2.5-0.025 MG tablet, Take 1 tablet by mouth 4 (four) times daily as needed for diarrhea or loose stools., Disp: 30 tablet, Rfl: 0   DULoxetine (CYMBALTA) 60 MG capsule, Take 1 capsule (60 mg total) by mouth daily., Disp: 60 capsule, Rfl: 2   EASY COMFORT PEN NEEDLES 31G X 5 MM MISC, USE AS DIRECTED WITH INSULIN PEN, Disp: 100 each, Rfl: 1   empagliflozin (JARDIANCE) 10 MG TABS tablet, Take 1 tablet (10 mg total) by mouth daily before breakfast., Disp: 30 tablet, Rfl: 3   famotidine (PEPCID) 20 MG tablet, Take 1 tablet (20 mg total) by mouth 2 (two) times daily., Disp: 120 tablet, Rfl: 2   Fingerstix Lancets MISC, Use as directed up to 4 times daily., Disp: 100 each, Rfl: 0   fluticasone (FLONASE) 50 MCG/ACT nasal spray, Place 2 sprays into both nostrils daily., Disp: 16 g, Rfl: 0   furosemide (LASIX) 40 MG tablet, Take 1 tablet (40 mg total) by mouth daily., Disp: 60 tablet, Rfl: 2   gabapentin (NEURONTIN) 300 MG capsule, TAKE 2 CAPSULES IN THE MORNING AND 1 CAPSULE MIDDAY AND 2 CAPSULES EVERY EVENING. (2AM+1N+2PM), Disp: 150 capsule, Rfl: 1   glucose blood test strip, Use four times daily as directed., Disp: 100 each, Rfl: 0   hydrOXYzine (ATARAX) 25 MG tablet, TAKE 1 TABLET (25 MG TOTAL) BY MOUTH 3 (THREE) TIMES DAILY AS NEEDED. (AM+NOON+BEDTIME), Disp: 270 tablet, Rfl: 0   insulin aspart protamine - aspart (NOVOLOG MIX 70/30 FLEXPEN) (70-30) 100 UNIT/ML FlexPen, INJECT 45 UNITS INTO THE SKIN 2 (TWO) TIMES DAILY WITH A MEAL., Disp: 30 mL, Rfl: 2   metFORMIN (GLUCOPHAGE) 500 MG  tablet, Take 2 tablets (1,000 mg total) by mouth 2 (two) times daily with a meal., Disp: 120 tablet, Rfl: 2   Multiple Vitamins-Minerals (PRESERVISION AREDS 2) CAPS, Take 1 capsule by mouth 2 (two) times daily with food. Take 1 in the morning and 1 in the evening., Disp: 120 capsule, Rfl: 6   nicotine (NICODERM CQ - DOSED IN MG/24 HOURS) 21 mg/24hr patch, Place 1 patch (21 mg total) onto the skin daily. (Patient not taking: Reported on 07/21/2022), Disp: 28 patch, Rfl: 0   nicotine polacrilex (NICORETTE MINI) 4 MG lozenge, Use 3 times a day to quit smoking (Patient not taking: Reported on 07/21/2022), Disp: 100 tablet, Rfl: 4   olopatadine (PATANOL) 0.1 % ophthalmic solution, Place 1 drop into the right eye 2 (two) times daily. (Patient not taking: Reported on 07/21/2022), Disp: 5 mL, Rfl: 12   polyethylene glycol powder (GLYCOLAX/MIRALAX) 17  GM/SCOOP powder, Take 17 g by mouth daily as needed for moderate constipation or severe constipation., Disp: 238 g, Rfl: 0   risperidone (RISPERDAL) 4 MG tablet, Take 1 tablet (4 mg total) by mouth once daily., Disp: 60 tablet, Rfl: 2   sitaGLIPtin (JANUVIA) 100 MG tablet, Take 1 tablet (100 mg total) by mouth daily., Disp: 90 tablet, Rfl: 1   SYMBICORT 160-4.5 MCG/ACT inhaler, Inhale 2 puffs into the lungs 2 (two) times daily., Disp: 10.2 g, Rfl: 0   tamsulosin (FLOMAX) 0.4 MG CAPS capsule, Take 1 capsule (0.4 mg total) by mouth daily., Disp: 60 capsule, Rfl: 2   VENTOLIN HFA 108 (90 Base) MCG/ACT inhaler, INHALE 2 PUFFS INTO THE LUNGS EVERY 6 (SIX) HOURS AS NEEDED FOR WHEEZING OR SHORTNESS OF BREATH., Disp: 54 g, Rfl: 0 No Known Allergies   Social History   Socioeconomic History   Marital status: Single    Spouse name: Not on file   Number of children: Not on file   Years of education: Not on file   Highest education level: Not on file  Occupational History   Not on file  Tobacco Use   Smoking status: Every Day    Current packs/day: 1.00    Types:  Cigarettes   Smokeless tobacco: Never  Vaping Use   Vaping status: Never Used  Substance and Sexual Activity   Alcohol use: Not Currently   Drug use: Never   Sexual activity: Not on file  Other Topics Concern   Not on file  Social History Narrative   Not on file   Social Determinants of Health   Financial Resource Strain: High Risk (11/30/2020)   [facility], [facility]   Overall Financial Resource Strain (CARDIA)    Difficulty of Paying Living Expenses: Very hard  Food Insecurity: Food Insecurity Present (05/05/2022)   Hunger Vital Sign    Worried About Running Out of Food in the Last Year: Often true    Ran Out of Food in the Last Year: Often true  Transportation Needs: Unmet Transportation Needs (11/30/2020)   [facility] from [facility], [facility]   [facility] - Transportation    In the past 12 months, has lack of transportation kept you from medical appointments or from getting medications?: Yes    Lack of Transportation (Non-Medical): Yes  Physical Activity: Inactive (12/01/2020)   [facility] from [facility], [facility]   Exercise Vital Sign    Days of Exercise per Week: 0 days    Minutes of Exercise per Session: 0 min  Stress: Stress Concern Present (11/30/2020)   Received from [facility], [facility]   [employer] of Occupational Health - Occupational Stress Questionnaire    Feeling of Stress : Very much  Social Connections: Unknown (05/23/2021)   Received from [facility]   Social Network    Social Network: Not on file  Intimate Partner Violence: Unknown (04/14/2021)   Received from [facility]   HITS    Physically Hurt: Not on file    Insult or Talk Down To: Not on file    Threaten Physical Harm: Not on file    Scream or Curse: Not on file    Physical Exam      No future  appointments.

## 2022-09-15 NOTE — Telephone Encounter (Signed)
Referral to [facility] Care Management

## 2022-09-16 ENCOUNTER — Telehealth: Payer: Self-pay | Admitting: *Deleted

## 2022-09-16 ENCOUNTER — Other Ambulatory Visit: Payer: Self-pay | Admitting: Pharmacist

## 2022-09-16 ENCOUNTER — Ambulatory Visit: Payer: Self-pay | Admitting: *Deleted

## 2022-09-16 ENCOUNTER — Telehealth: Payer: Self-pay | Admitting: Critical Care Medicine

## 2022-09-16 DIAGNOSIS — Z794 Long term (current) use of insulin: Secondary | ICD-10-CM

## 2022-09-16 MED ORDER — DEXCOM G7 SENSOR MISC
6 refills | Status: DC
Start: 2022-09-16 — End: 2022-10-22

## 2022-09-16 MED ORDER — DEXCOM G7 RECEIVER DEVI
0 refills | Status: DC
Start: 2022-09-16 — End: 2022-10-22

## 2022-09-16 NOTE — Telephone Encounter (Signed)
  Chief Complaint: Raymond Hood, with Adult Protective Services  See notes Symptoms: N/A Frequency: N/A Pertinent Negatives: Patient denies N/A Disposition: [] ED /[] Urgent Care (no appt availability in office) / [] Appointment(In office/virtual)/ []  [place] [facility]/ [] Home Care/ [] Refused Recommended Disposition /[] [facility]/ [x]  Follow-up with PCP Additional Notes: Referred to Thurston Pounds, RN   Raymond Hood is calling the main number to get in contact with Raymond Hood.

## 2022-09-16 NOTE — Progress Notes (Unsigned)
  Care Coordination  Outreach Note  09/16/2022 Name: Rajahn Savidge MRN: [id number] DOB: 1970-10-06   Care Coordination Outreach Attempts: An unsuccessful telephone outreach was attempted today to offer the patient information about available care coordination services.  Follow Up Plan:  Additional outreach attempts will be made to offer the patient care coordination information and services.   Encounter Outcome:  No Answer  Gwenevere Ghazi  Care Coordination Care Guide  Direct Dial: [phone]

## 2022-09-16 NOTE — Telephone Encounter (Signed)
Reason for Disposition  [1] Caller has NON-URGENT medicine question about med that PCP prescribed AND [2] triager unable to answer question    Raymond Hood with Adult Protective Services calling in about his medication Januvia.  Answer Assessment - Initial Assessment Questions 1. NAME of MEDICINE: "What medicine(s) are you calling about?"     Januvia 100 mg 2. QUESTION: "What is your question?" (e.g., double dose of medicine, side effect)     Raymond Hood with Adult Protective Services calling in wanting to know if he is still taking this and when was the last time it was filled. It does not look like it has been dispensed per the pharmacy note.   There is a message from yesterday where one of the paramedics that goes to his home has written in trying to clarify if he is on Trajenta or Januvia on 09/15/2022.   I let Raymond Hood know about this message. She wanted to know the last time he saw Raymond Hood which was in Jan. 2024.  She needed contact information for Raymond Pounds, RN since  I let Raymond Hood know she is the one she needed to talk with.    Raymond Hood has several questions regarding the pt I was not able to answer.   I gave Raymond Hood the main number for [facility] so they could get Raymond Hood in contact with Raymond Hood as Raymond Hood could assist with Raymond Hood's questions.  3. PRESCRIBER: "Who prescribed the medicine?" Reason: if prescribed by specialist, call should be referred to that group.     Raymond Hood 4. SYMPTOMS: "Do you have any symptoms?" If Yes, ask: "What symptoms are you having?"  "How bad are the symptoms (e.g., mild, moderate, severe)     N/A 5. PREGNANCY:  "Is there any chance that you are pregnant?" "When was your last menstrual period?"     N/A  Protocols used: Medication Question Call-A-AH

## 2022-09-16 NOTE — Telephone Encounter (Signed)
Copied from CRM [id number]. Topic: General - Other >> Sep 16, 2022 10:28 AM Turkey B wrote: Reason for CRM: [place] from [employer] called in to speak with Erskine Squibb B to confirm if pt is supposed ot be taking [place] 150mg 

## 2022-09-19 ENCOUNTER — Telehealth (HOSPITAL_COMMUNITY): Payer: Self-pay

## 2022-09-19 NOTE — Telephone Encounter (Signed)
He is on jardiance not [place]

## 2022-09-19 NOTE — Progress Notes (Unsigned)
  Care Coordination  Outreach Note  09/19/2022 Name: Raymond Hood MRN: [id number] DOB: [birth year]   Care Coordination Outreach Attempts: A second unsuccessful outreach was attempted today to offer the patient with information about available care coordination services.  Follow Up Plan:  Additional outreach attempts will be made to offer the patient care coordination information and services.   Encounter Outcome:  No Answer  Gwenevere Ghazi  Care Coordination Care Guide  Direct Dial: [phone]

## 2022-09-19 NOTE — Telephone Encounter (Signed)
[facility] filled bubble packs for Mr. Proto with Jearld Lesch in his packs- but his med list reflects he is to be taking Januvia.   Would it be okay for him to continue taking this rather than the Januvia for now or should the Januvia be in his bubble packs?   Please advise and send the appropriate med to [facility] so Maralyn Sago (community paramedic) can go out to correct his bubble packs with correct med.   Thanks!  Maralyn Sago, EMT-Paramedic [phone] 09/19/2022

## 2022-09-19 NOTE — Telephone Encounter (Signed)
I have never ordered [place] for this pt  only jardiance

## 2022-09-20 ENCOUNTER — Telehealth (HOSPITAL_COMMUNITY): Payer: Self-pay | Admitting: Emergency Medicine

## 2022-09-20 MED ORDER — EMPAGLIFLOZIN 10 MG PO TABS: 10.0000 mg | ORAL_TABLET | Freq: Every day | ORAL | 3 refills | Status: DC

## 2022-09-20 NOTE — Telephone Encounter (Signed)
OK I WILL RESEND

## 2022-09-20 NOTE — Progress Notes (Signed)
  Care Coordination  Outreach Note  09/20/2022 Name: Raymond Hood MRN: [id number] DOB: September 16, 1970   Care Coordination Outreach Attempts: A third unsuccessful outreach was attempted today to offer the patient with information about available care coordination services.  Follow Up Plan:  No further outreach attempts will be made at this time. We have been unable to contact the patient to offer or enroll patient in care coordination services  Encounter Outcome:  No Answer   Gwenevere Ghazi  Care Coordination Care Guide  Direct Dial: [phone]

## 2022-09-20 NOTE — Telephone Encounter (Signed)
Based off of Dr. Lynelle Doctor new orders, I wanted to update patient and return his pill packs for repackaging. Unable to reach [place] on the phone.  Will continue to follow up.  Benson Setting EMT-P Community Paramedic  [phone]

## 2022-09-20 NOTE — Addendum Note (Signed)
Addended by: Storm Frisk on: 09/20/2022 08:32 AM   Modules accepted: Orders

## 2022-09-20 NOTE — Telephone Encounter (Signed)
Attempting to reach Mr. Amundsen to fix his pill packs, unable to reach on 2nd attempt.   Will continue to follow up with same.   Benson Setting EMT-P Community Paramedic  [phone]

## 2022-09-21 ENCOUNTER — Telehealth (HOSPITAL_COMMUNITY): Payer: Self-pay | Admitting: Emergency Medicine

## 2022-09-21 NOTE — Telephone Encounter (Signed)
Attempted again to reach out to Mr. Doege about his medication management and bed bug resources but unable to leave a message and he has not returned my calls.   Will continue to follow up.   Benson Setting EMT-P Community Paramedic  [phone]

## 2022-09-21 NOTE — Telephone Encounter (Signed)
Attempted to contact Raymond Hood again to schedule to resolve his pill packs and to provide him with further resources regarding bed bugs.  Pt did not answer and mail box is full and unable to receive messages  Will continue to try to contact.  Benson Setting EMT-P Community Paramedic  [phone]

## 2022-09-22 ENCOUNTER — Telehealth (HOSPITAL_COMMUNITY): Payer: Self-pay | Admitting: Emergency Medicine

## 2022-09-22 NOTE — Telephone Encounter (Signed)
noted 

## 2022-09-22 NOTE — Telephone Encounter (Signed)
Again tried to get in touch with Raymond Hood regarding his medication changes and dex coms and bed bugs, unable to reach him or leave a message. No returned phone calls from him either.   Will continue to try to reach out next week.    Benson Setting EMT-P Community Paramedic  [phone]

## 2022-09-26 ENCOUNTER — Telehealth (HOSPITAL_COMMUNITY): Payer: Self-pay | Admitting: Emergency Medicine

## 2022-09-26 ENCOUNTER — Other Ambulatory Visit: Payer: Self-pay | Admitting: Emergency Medicine

## 2022-09-26 NOTE — Telephone Encounter (Signed)
Attempted to contact Raymond Hood again this morning to schedule a time to fix his pill packs and apply his dex com. Pt again did not answer and VM too full to leave a message.   Will continue to try to follow up.   Benson Setting EMT-P Community Paramedic  [phone]

## 2022-09-26 NOTE — Progress Notes (Signed)
Was able to get in touch with Mr. Harwell. Came to reconcile pt's medications. I retrieved 2x pill packs and took them to the pharmacy, confirming prescriptions and with med lists so pharmacy could fix pill packs to reflect med list. Pharmacy avised that they would fix the pill packs tonight and deliver them in the AM.   While at the pt's home, pt refused assistance putting on his dexcom. Pt was educated on the purpose of same and advised that he was not in the mood to do that today. I explained that without it he would have to prick his finger, and pt advised that he had not been doing that either. Pt was advised that either was necessary to manage his diabetes effectively. Pt was not interested in information given and again refused.    Will follow up to ensure delivery.   Benson Setting EMT-P Community Paramedic  [phone]

## 2022-09-27 ENCOUNTER — Telehealth (HOSPITAL_COMMUNITY): Payer: Self-pay | Admitting: Emergency Medicine

## 2022-09-27 ENCOUNTER — Telehealth: Payer: Self-pay | Admitting: Critical Care Medicine

## 2022-09-27 NOTE — Telephone Encounter (Signed)
Message left for Enid Skeens / [facility] requesting a call back

## 2022-09-27 NOTE — Telephone Encounter (Signed)
Raymond Hood received call from Enid Skeens CM at [facility] re this patient  He is being evicted from his apartment in 30 days d/t inability to keep it clean  He now accepts ALF placement and is willing to give up his disability payments  Can you help produce an FL2?  I wonder about [facility] ?  Can you partner with Leta Jungling on this ?   If this doesn't occur he will be back on the street homeless

## 2022-09-27 NOTE — Telephone Encounter (Signed)
Called Raymond Hood to confirm that he received his pill packs back from [facility]. Pt said that he had received them.   Will follow up with Raymond Hood in 2x weeks.   Benson Setting EMT-P Community Paramedic  [phone]

## 2022-09-28 NOTE — Telephone Encounter (Signed)
I spoke to Enid Skeens, [facility] and informed her that I gave the FL2 to Dr Delford Field and it can be sent to [facility] for review.  She is also planning to send to [facility].  She explained that the patient is very agreeable to ALF and is willing to relinquish his monthly check to the facility.  She said that he has a termination notice from the landlord and needs to be out of his home by 11/10/2022.

## 2022-10-04 NOTE — Telephone Encounter (Signed)
Message from [facility]  stating the [facility] has been submitted to [facility] and they are reviewing it.

## 2022-10-05 ENCOUNTER — Other Ambulatory Visit: Payer: Self-pay | Admitting: Critical Care Medicine

## 2022-10-10 ENCOUNTER — Encounter (HOSPITAL_COMMUNITY): Payer: Self-pay | Admitting: Emergency Medicine

## 2022-10-10 ENCOUNTER — Other Ambulatory Visit: Payer: Self-pay | Admitting: Emergency Medicine

## 2022-10-10 ENCOUNTER — Telehealth: Payer: Self-pay

## 2022-10-10 ENCOUNTER — Telehealth (HOSPITAL_COMMUNITY): Payer: Self-pay | Admitting: Emergency Medicine

## 2022-10-10 ENCOUNTER — Other Ambulatory Visit: Payer: Self-pay | Admitting: Critical Care Medicine

## 2022-10-10 NOTE — Telephone Encounter (Signed)
Call received from Benson Setting, EMT/Community Paramedicine. She was inquiring if I had an update on status of ALF request.  I told her that I don't have an update but will check with [facility].   I explained that he is $15 over income for Medicaid coverage for [employer] but the administrator was going to check with management to see if the patient could still be accepted.  Maralyn Sago is concerned about the patient's ability to care for himself as he is not able to bathe, dress himself. He has not cleaned his feet in months and has been incontinent of urine and feces and is is not able to clean up his environment.  She inquired about placing an APS referral and I informed her that APS has been involved, Ms Margaretha Sheffield. Maralyn Sago said she would follow up with Ms Margaretha Sheffield.   I tried to reach Chana Bode, Administrator- [employer] and she was off today. I then spoke to Enid Skeens, [facility] and she had no update from [employer].   I then sent an email to Chana Bode requesting an update on the status of the application

## 2022-10-10 NOTE — Telephone Encounter (Signed)
Received a call from Keymari indicating that his pill packs were in if I wanted to review them. As I returned his call, he began complaining of red/hot/swollen legs. I advised that I would follow up today on both.   Benson Setting EMT-P Community Paramedic  [phone]

## 2022-10-10 NOTE — Progress Notes (Unsigned)
Paramedicine Encounter    Patient ID: Raymond Hood, male    DOB: [birth year], 52 y.o.   MRN: [id number]   Complaints - foot pain   Assessment -grossly contaminated skin, peeled off when removing sock, toenails had grown through socks    Compliance with meds - Pt has pill packs and just started a new one unable to determine, but pt reports doing well.   Pill box filled 4x week pill packs   Refills needed - Symbocort  Meds changes since last visit - Jardiance    Social changes - incapable to live by himself.    VISIT SUMMARY**  Met with Raymond Hood in the home after he contacted me regarding his new pill packs and new onset of foot pain. I arrived and confirmed his pill packs to be correct. Raymond Hood requested I assess his feet for increased pain. Pt was not able to give a clear time line, just advised that they have been hurting for a while. Pt laid down and reported he was unable to remove his shoes and socks. I inquired how he bathes and cleans himself. Pt advised that he just leaves them on due to his disability. When further attempting to understand this situation, pt advised that his socks and possibly his shoes have been on since his last podiatry appointment that he believes was 1x month prior. However upon review,  last podiatry appointment was 03/16/22. Follow up for same was scheduled. As I removed pt's shoes, his toenails had grown through the fabric of the sock. Due to pt's chronic swollen feet and incontinence soiling, pt's socks were moist with brown film. Socks were removed and as they came off, they pulled layers of skin off with them. No open wounds noted. During assessment, pt advised that he needed to go to the restroom. Pt stood up and continued to urinate on himself while fully dressed, urine spilling on the bed and the floor. Pt blamed incontinence on his furosemide pill and when asked, stated that he was unable to don and doff incontinence supplies to prevent same. Pt was further  assessed to note hypoxia and tacycardia. Pt reports smoking approximately 1x pack of cigarettes a day and completed on prior to assessment. Pt reported that he has been out of his symbocort for a few days, however this is the first I am hearing of it. I followed up with PCP for order and pharmacy for next day delivery. After assessment, I contacted Raymond Hood concerned with his worsening environment. She stated that she would follow up on in-home aid and the status of his move into a facility. I inquired about possible APS involvement due to pt's living conditions and health due to same. Raymond Hood informed me of APS involvement and supplied me with the contact for Raymond Hood, the caseworker for APS. I will follow up on the phone with Raymond Hood this week regarding all of the updates I find.    BP (!) 144/100   Pulse (!) 120   Resp 18   SpO2 90%  Weight yesterday-DNW Last visit weight-DNW  Benson Setting EMT-P Community Paramedic  [phone]     ACTION: Home visit completed     Patient Care Team: Storm Frisk, MD as PCP - General (Pulmonary Disease)  Patient Active Problem List   Diagnosis Date Noted   Anxiety and depression 07/14/2021   Essential hypertension 07/14/2021   Chronic diastolic heart failure (HCC) 01/14/2021   Type 2 diabetes mellitus with hyperglycemia, with long-term current use of  insulin (HCC) 12/14/2020   GERD (gastroesophageal reflux disease) 10/15/2020   Tobacco abuse 10/15/2020   Renal mass, right 10/15/2020   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   Morbid obesity with BMI of 50.0-59.9, adult (HCC) 10/15/2020   Sheltered homelessness 10/15/2020   MDD (major depressive disorder), recurrent episode (HCC) 09/28/2020   Anxiety 09/28/2020   Schizo affective schizophrenia (HCC) 09/28/2020   Numbness and tingling in both hands 05/28/2020   Adrenal hyperplasia (HCC) 06/05/2019   Low back pain 08/29/2018   Multiple joint pain 08/29/2018   OSA/OHS 08/17/2018    Exocrine pancreatic insufficiency 07/02/2018   Asthma without status asthmaticus 03/27/2018   Epidermal inclusion cyst 07/20/2012   Psoriasis, unspecified 06/28/2012   Erectile dysfunction 06/28/2012   ADHD 11/17/1981    Current Outpatient Medications:    atorvastatin (LIPITOR) 10 MG tablet, Take 1 tablet (10 mg total) by mouth once daily., Disp: 90 tablet, Rfl: 1   Blood Glucose Monitoring Suppl (ACCU-CHEK GUIDE) w/Device KIT, Use up to four times daily as directed., Disp: 1 kit, Rfl: 0   Continuous Glucose Receiver (DEXCOM G7 RECEIVER) DEVI, Use to check blood sugar throughout the day. E11.65, Disp: 1 each, Rfl: 0   Continuous Glucose Sensor (DEXCOM G7 SENSOR) MISC, Use to check blood sugar throughout the day. Change sensors once every 10 days. E11.65, Disp: 3 each, Rfl: 6   CREON 12000-38000 units CPEP capsule, Take 1 capsule (12,000 Units total) by mouth 3 (three) times daily., Disp: 270 capsule, Rfl: 2   diphenoxylate-atropine (LOMOTIL) 2.5-0.025 MG tablet, Take 1 tablet by mouth 4 (four) times daily as needed for diarrhea or loose stools., Disp: 30 tablet, Rfl: 0   DULoxetine (CYMBALTA) 60 MG capsule, Take 1 capsule (60 mg total) by mouth daily., Disp: 60 capsule, Rfl: 2   EASY COMFORT PEN NEEDLES 31G X 5 MM MISC, USE AS DIRECTED WITH INSULIN PEN, Disp: 100 each, Rfl: 1   empagliflozin (JARDIANCE) 10 MG TABS tablet, Take 1 tablet (10 mg total) by mouth daily before breakfast., Disp: 30 tablet, Rfl: 3   famotidine (PEPCID) 20 MG tablet, Take 1 tablet (20 mg total) by mouth 2 (two) times daily., Disp: 120 tablet, Rfl: 2   Fingerstix Lancets MISC, Use as directed up to 4 times daily., Disp: 100 each, Rfl: 0   fluticasone (FLONASE) 50 MCG/ACT nasal spray, Place 2 sprays into both nostrils daily., Disp: 16 g, Rfl: 0   furosemide (LASIX) 40 MG tablet, Take 1 tablet (40 mg total) by mouth daily., Disp: 60 tablet, Rfl: 2   gabapentin (NEURONTIN) 300 MG capsule, TAKE 2 CAPSULES IN THE MORNING AND  1 CAPSULE MIDDAY AND 2 CAPSULES EVERY EVENING. (2AM+1N+2PM), Disp: 150 capsule, Rfl: 1   glucose blood test strip, Use four times daily as directed., Disp: 100 each, Rfl: 0   hydrOXYzine (ATARAX) 25 MG tablet, TAKE 1 TABLET (25 MG TOTAL) BY MOUTH 3 (THREE) TIMES DAILY AS NEEDED. (AM+NOON+BEDTIME), Disp: 270 tablet, Rfl: 0   insulin aspart protamine - aspart (NOVOLOG MIX 70/30 FLEXPEN) (70-30) 100 UNIT/ML FlexPen, Inject 45 Units into the skin 2 (two) times daily with a meal., Disp: 30 mL, Rfl: 0   polyethylene glycol powder (GLYCOLAX/MIRALAX) 17 GM/SCOOP powder, Take 17 g by mouth daily as needed for moderate constipation or severe constipation., Disp: 238 g, Rfl: 0   risperidone (RISPERDAL) 4 MG tablet, Take 1 tablet (4 mg total) by mouth once daily., Disp: 60 tablet, Rfl: 2   tamsulosin (FLOMAX) 0.4 MG CAPS capsule,  Take 1 capsule (0.4 mg total) by mouth daily., Disp: 60 capsule, Rfl: 2   VENTOLIN HFA 108 (90 Base) MCG/ACT inhaler, INHALE 2 PUFFS INTO THE LUNGS EVERY 6 (SIX) HOURS AS NEEDED FOR WHEEZING OR SHORTNESS OF BREATH., Disp: 54 g, Rfl: 0   metFORMIN (GLUCOPHAGE) 500 MG tablet, Take 2 tablets (1,000 mg total) by mouth 2 (two) times daily with a meal., Disp: 120 tablet, Rfl: 2   Multiple Vitamins-Minerals (PRESERVISION AREDS 2) CAPS, Take 1 capsule by mouth 2 (two) times daily with food. Take 1 in the morning and 1 in the evening. (Patient not taking: Reported on 09/26/2022), Disp: 120 capsule, Rfl: 6   nicotine (NICODERM CQ - DOSED IN MG/24 HOURS) 21 mg/24hr patch, Place 1 patch (21 mg total) onto the skin daily. (Patient not taking: Reported on 07/21/2022), Disp: 28 patch, Rfl: 0   nicotine polacrilex (NICORETTE MINI) 4 MG lozenge, Use 3 times a day to quit smoking (Patient not taking: Reported on 07/21/2022), Disp: 100 tablet, Rfl: 4   olopatadine (PATANOL) 0.1 % ophthalmic solution, Place 1 drop into the right eye 2 (two) times daily. (Patient not taking: Reported on 07/21/2022), Disp: 5 mL,  Rfl: 12   SYMBICORT 160-4.5 MCG/ACT inhaler, Inhale 2 puffs into the lungs 2 (two) times daily. (Patient not taking: Reported on 10/10/2022), Disp: 10.2 g, Rfl: 0 No Known Allergies   Social History   Socioeconomic History   Marital status: Single    Spouse name: Not on file   Number of children: Not on file   Years of education: Not on file   Highest education level: Not on file  Occupational History   Not on file  Tobacco Use   Smoking status: Every Day    Current packs/day: 1.00    Types: Cigarettes   Smokeless tobacco: Never  Vaping Use   Vaping status: Never Used  Substance and Sexual Activity   Alcohol use: Not Currently   Drug use: Never   Sexual activity: Not on file  Other Topics Concern   Not on file  Social History Narrative   Not on file   Social Determinants of Health   Financial Resource Strain: High Risk (11/30/2020)   Received from [facility], [facility]   Overall Financial Resource Strain (CARDIA)    Difficulty of Paying Living Expenses: Very hard  Food Insecurity: Food Insecurity Present (05/05/2022)   Hunger Vital Sign    Worried About Running Out of Food in the Last Year: Often true    Ran Out of Food in the Last Year: Often true  Transportation Needs: Unmet Transportation Needs (11/30/2020)   Received from [facility], [facility]   [facility] - Transportation    In the past 12 months, has lack of transportation kept you from medical appointments or from getting medications?: Yes    Lack of Transportation (Non-Medical): Yes  Physical Activity: Inactive (12/01/2020)   Received from [facility], [facility]   Exercise Vital Sign    Days of Exercise per Week: 0 days    Minutes of Exercise per Session: 0 min  Stress: Stress Concern Present (11/30/2020)   Received from [facility], [facility]   [employer] [facility] - Occupational Stress Questionnaire    Feeling of Stress : Very much  Social Connections: Unknown (05/23/2021)   Received from [facility], [facility]   Social Network  Social Network: Not on file  Intimate Partner Violence: Unknown (04/14/2021)   Received from [facility], [facility]   HITS    Physically Hurt: Not on file    Insult or Talk Down To: Not on file    Threaten Physical Harm: Not on file    Scream or Curse: Not on file    Physical Exam      Future Appointments  Date Time Provider Department Center  10/17/2022 10:00 AM Helane Gunther, DPM [facility]  11/01/2022  9:50 AM Hoy Register, MD [facility] None

## 2022-10-11 ENCOUNTER — Telehealth (HOSPITAL_COMMUNITY): Payer: Self-pay

## 2022-10-11 ENCOUNTER — Telehealth (HOSPITAL_COMMUNITY): Payer: Self-pay | Admitting: Emergency Medicine

## 2022-10-11 MED ORDER — SYMBICORT 160-4.5 MCG/ACT IN AERO: 2.0000 | INHALATION_SPRAY | Freq: Two times a day (BID) | RESPIRATORY_TRACT | 6 refills | Status: DC

## 2022-10-11 NOTE — Telephone Encounter (Signed)
I contacted Marlana Latus this morning to provide more information about Mr. Crapps's ongoing APS case.   I left a message requesting a call back, will try again If no call is returned.  Benson Setting EMT-P Community Paramedic  [phone]

## 2022-10-11 NOTE — Telephone Encounter (Signed)
Spoke to [employer] this morning to refill Symbicort for Mr. Doris- [facility] Pharmacist- Alecia Lemming reporting [facility] needs a new RX. I will forward to [facility] at [facility] for same.   Maralyn Sago, EMT-Paramedic [phone] 10/11/2022

## 2022-10-11 NOTE — Addendum Note (Signed)
Addended by: Lois Huxley, Jhordan Kinter L on: 10/11/2022 12:00 PM   Modules accepted: Orders

## 2022-10-11 NOTE — Telephone Encounter (Signed)
Raymond Hood  [facility].   Overpayment due to him. Could use to spend down on new clothes/socks/shoes/hygine  Call social security - awards letter- back pay  Need a grant?

## 2022-10-11 NOTE — Progress Notes (Signed)
Made in error

## 2022-10-13 ENCOUNTER — Emergency Department (HOSPITAL_COMMUNITY): Payer: MEDICAID

## 2022-10-13 ENCOUNTER — Telehealth (HOSPITAL_COMMUNITY): Payer: Self-pay | Admitting: Emergency Medicine

## 2022-10-13 ENCOUNTER — Inpatient Hospital Stay (HOSPITAL_COMMUNITY)
Admission: EM | Admit: 2022-10-13 | Discharge: 2022-11-11 | DRG: 207 | Disposition: E | Payer: MEDICAID | Attending: Emergency Medicine | Admitting: Emergency Medicine

## 2022-10-13 ENCOUNTER — Encounter (HOSPITAL_COMMUNITY): Payer: Self-pay

## 2022-10-13 DIAGNOSIS — F17210 Nicotine dependence, cigarettes, uncomplicated: Secondary | ICD-10-CM | POA: Diagnosis present

## 2022-10-13 DIAGNOSIS — R4182 Altered mental status, unspecified: Secondary | ICD-10-CM | POA: Diagnosis not present

## 2022-10-13 DIAGNOSIS — J690 Pneumonitis due to inhalation of food and vomit: Secondary | ICD-10-CM | POA: Diagnosis not present

## 2022-10-13 DIAGNOSIS — F259 Schizoaffective disorder, unspecified: Secondary | ICD-10-CM | POA: Diagnosis present

## 2022-10-13 DIAGNOSIS — I5033 Acute on chronic diastolic (congestive) heart failure: Secondary | ICD-10-CM | POA: Diagnosis present

## 2022-10-13 DIAGNOSIS — A419 Sepsis, unspecified organism: Secondary | ICD-10-CM | POA: Diagnosis not present

## 2022-10-13 DIAGNOSIS — I509 Heart failure, unspecified: Secondary | ICD-10-CM

## 2022-10-13 DIAGNOSIS — Z5901 Sheltered homelessness: Secondary | ICD-10-CM | POA: Diagnosis not present

## 2022-10-13 DIAGNOSIS — I468 Cardiac arrest due to other underlying condition: Secondary | ICD-10-CM | POA: Diagnosis not present

## 2022-10-13 DIAGNOSIS — F32A Depression, unspecified: Secondary | ICD-10-CM | POA: Diagnosis not present

## 2022-10-13 DIAGNOSIS — Z6841 Body Mass Index (BMI) 40.0 and over, adult: Secondary | ICD-10-CM

## 2022-10-13 DIAGNOSIS — Z818 Family history of other mental and behavioral disorders: Secondary | ICD-10-CM

## 2022-10-13 DIAGNOSIS — Z79899 Other long term (current) drug therapy: Secondary | ICD-10-CM

## 2022-10-13 DIAGNOSIS — R601 Generalized edema: Secondary | ICD-10-CM | POA: Diagnosis not present

## 2022-10-13 DIAGNOSIS — R0602 Shortness of breath: Secondary | ICD-10-CM | POA: Diagnosis present

## 2022-10-13 DIAGNOSIS — I4891 Unspecified atrial fibrillation: Secondary | ICD-10-CM | POA: Diagnosis present

## 2022-10-13 DIAGNOSIS — J9601 Acute respiratory failure with hypoxia: Principal | ICD-10-CM | POA: Diagnosis not present

## 2022-10-13 DIAGNOSIS — E876 Hypokalemia: Secondary | ICD-10-CM | POA: Diagnosis present

## 2022-10-13 DIAGNOSIS — Z515 Encounter for palliative care: Secondary | ICD-10-CM

## 2022-10-13 DIAGNOSIS — J4489 Other specified chronic obstructive pulmonary disease: Secondary | ICD-10-CM | POA: Diagnosis present

## 2022-10-13 DIAGNOSIS — G4733 Obstructive sleep apnea (adult) (pediatric): Secondary | ICD-10-CM | POA: Diagnosis not present

## 2022-10-13 DIAGNOSIS — J45909 Unspecified asthma, uncomplicated: Secondary | ICD-10-CM | POA: Diagnosis present

## 2022-10-13 DIAGNOSIS — I878 Other specified disorders of veins: Secondary | ICD-10-CM | POA: Diagnosis present

## 2022-10-13 DIAGNOSIS — I10 Essential (primary) hypertension: Secondary | ICD-10-CM | POA: Diagnosis present

## 2022-10-13 DIAGNOSIS — E662 Morbid (severe) obesity with alveolar hypoventilation: Secondary | ICD-10-CM | POA: Diagnosis present

## 2022-10-13 DIAGNOSIS — F419 Anxiety disorder, unspecified: Secondary | ICD-10-CM | POA: Diagnosis present

## 2022-10-13 DIAGNOSIS — N170 Acute kidney failure with tubular necrosis: Secondary | ICD-10-CM | POA: Diagnosis present

## 2022-10-13 DIAGNOSIS — E870 Hyperosmolality and hypernatremia: Secondary | ICD-10-CM | POA: Diagnosis not present

## 2022-10-13 DIAGNOSIS — Z7951 Long term (current) use of inhaled steroids: Secondary | ICD-10-CM

## 2022-10-13 DIAGNOSIS — R6521 Severe sepsis with septic shock: Secondary | ICD-10-CM | POA: Diagnosis not present

## 2022-10-13 DIAGNOSIS — N2889 Other specified disorders of kidney and ureter: Secondary | ICD-10-CM | POA: Diagnosis present

## 2022-10-13 DIAGNOSIS — Z720 Tobacco use: Secondary | ICD-10-CM | POA: Diagnosis present

## 2022-10-13 DIAGNOSIS — G9341 Metabolic encephalopathy: Secondary | ICD-10-CM | POA: Diagnosis not present

## 2022-10-13 DIAGNOSIS — K219 Gastro-esophageal reflux disease without esophagitis: Secondary | ICD-10-CM | POA: Diagnosis present

## 2022-10-13 DIAGNOSIS — L03115 Cellulitis of right lower limb: Secondary | ICD-10-CM | POA: Diagnosis present

## 2022-10-13 DIAGNOSIS — Z794 Long term (current) use of insulin: Secondary | ICD-10-CM

## 2022-10-13 DIAGNOSIS — L309 Dermatitis, unspecified: Secondary | ICD-10-CM | POA: Diagnosis present

## 2022-10-13 DIAGNOSIS — E8770 Fluid overload, unspecified: Secondary | ICD-10-CM

## 2022-10-13 DIAGNOSIS — N179 Acute kidney failure, unspecified: Secondary | ICD-10-CM | POA: Diagnosis present

## 2022-10-13 DIAGNOSIS — Z7984 Long term (current) use of oral hypoglycemic drugs: Secondary | ICD-10-CM

## 2022-10-13 DIAGNOSIS — I110 Hypertensive heart disease with heart failure: Secondary | ICD-10-CM | POA: Diagnosis present

## 2022-10-13 DIAGNOSIS — G931 Anoxic brain damage, not elsewhere classified: Secondary | ICD-10-CM | POA: Diagnosis not present

## 2022-10-13 DIAGNOSIS — I5032 Chronic diastolic (congestive) heart failure: Secondary | ICD-10-CM | POA: Diagnosis present

## 2022-10-13 DIAGNOSIS — F339 Major depressive disorder, recurrent, unspecified: Secondary | ICD-10-CM | POA: Diagnosis present

## 2022-10-13 DIAGNOSIS — I469 Cardiac arrest, cause unspecified: Secondary | ICD-10-CM | POA: Diagnosis not present

## 2022-10-13 DIAGNOSIS — W010XXA Fall on same level from slipping, tripping and stumbling without subsequent striking against object, initial encounter: Secondary | ICD-10-CM | POA: Diagnosis present

## 2022-10-13 DIAGNOSIS — M069 Rheumatoid arthritis, unspecified: Secondary | ICD-10-CM | POA: Diagnosis present

## 2022-10-13 DIAGNOSIS — Y92002 Bathroom of unspecified non-institutional (private) residence single-family (private) house as the place of occurrence of the external cause: Secondary | ICD-10-CM

## 2022-10-13 DIAGNOSIS — Z833 Family history of diabetes mellitus: Secondary | ICD-10-CM

## 2022-10-13 DIAGNOSIS — E873 Alkalosis: Secondary | ICD-10-CM | POA: Diagnosis not present

## 2022-10-13 DIAGNOSIS — R569 Unspecified convulsions: Secondary | ICD-10-CM | POA: Diagnosis not present

## 2022-10-13 DIAGNOSIS — E1165 Type 2 diabetes mellitus with hyperglycemia: Secondary | ICD-10-CM | POA: Diagnosis present

## 2022-10-13 LAB — URINALYSIS, ROUTINE W REFLEX MICROSCOPIC
Bacteria, UA: NONE SEEN
Bilirubin Urine: NEGATIVE
Glucose, UA: >=500 mg/dL — AB
Hgb urine dipstick: NEGATIVE
Ketones, ur: NEGATIVE mg/dL
Leukocytes,Ua: NEGATIVE
Nitrite: NEGATIVE
Protein, ur: NEGATIVE mg/dL
Specific Gravity, Urine: 1.029 (ref 1.005–1.030)
pH: 6 (ref 5.0–8.0)

## 2022-10-13 LAB — CBC WITH DIFFERENTIAL/PLATELET
Abs Immature Granulocytes: 0.21 10*3/uL — ABNORMAL HIGH (ref 0.00–0.07)
Basophils Absolute: 0.1 10*3/uL (ref 0.0–0.1)
Basophils Relative: 1 %
Eosinophils Absolute: 0.2 10*3/uL (ref 0.0–0.5)
Eosinophils Relative: 1 %
HCT: 53.6 % — ABNORMAL HIGH (ref 39.0–52.0)
Hemoglobin: 15.8 g/dL (ref 13.0–17.0)
Immature Granulocytes: 2 %
Lymphocytes Relative: 15 %
Lymphs Abs: 1.7 10*3/uL (ref 0.7–4.0)
MCH: 26.8 pg (ref 26.0–34.0)
MCHC: 29.5 g/dL — ABNORMAL LOW (ref 30.0–36.0)
MCV: 91 fL (ref 80.0–100.0)
Monocytes Absolute: 0.8 10*3/uL (ref 0.1–1.0)
Monocytes Relative: 7 %
Neutro Abs: 8.4 10*3/uL — ABNORMAL HIGH (ref 1.7–7.7)
Neutrophils Relative %: 74 %
Platelets: 279 10*3/uL (ref 150–400)
RBC: 5.89 MIL/uL — ABNORMAL HIGH (ref 4.22–5.81)
RDW: 17 % — ABNORMAL HIGH (ref 11.5–15.5)
WBC: 11.3 10*3/uL — ABNORMAL HIGH (ref 4.0–10.5)
nRBC: 0 % (ref 0.0–0.2)

## 2022-10-13 LAB — COMPREHENSIVE METABOLIC PANEL
ALT: 22 U/L (ref 0–44)
AST: 20 U/L (ref 15–41)
Albumin: 3 g/dL — ABNORMAL LOW (ref 3.5–5.0)
Alkaline Phosphatase: 81 U/L (ref 38–126)
Anion gap: 10 (ref 5–15)
BUN: 18 mg/dL (ref 6–20)
CO2: 32 mmol/L (ref 22–32)
Calcium: 7.8 mg/dL — ABNORMAL LOW (ref 8.9–10.3)
Chloride: 97 mmol/L — ABNORMAL LOW (ref 98–111)
Creatinine, Ser: 0.96 mg/dL (ref 0.61–1.24)
GFR, Estimated: >60 mL/min (ref >60)
Glucose, Bld: 284 mg/dL — ABNORMAL HIGH (ref 70–99)
Potassium: 4.4 mmol/L (ref 3.5–5.1)
Sodium: 139 mmol/L (ref 135–145)
Total Bilirubin: 0.5 mg/dL (ref 0.3–1.2)
Total Protein: 6.3 g/dL — ABNORMAL LOW (ref 6.5–8.1)

## 2022-10-13 LAB — BRAIN NATRIURETIC PEPTIDE: B Natriuretic Peptide: 201.4 pg/mL — ABNORMAL HIGH (ref 0.0–100.0)

## 2022-10-13 LAB — GLUCOSE, CAPILLARY: Glucose-Capillary: 174 mg/dL — ABNORMAL HIGH (ref 70–99)

## 2022-10-13 MED ORDER — MOMETASONE FURO-FORMOTEROL FUM 200-5 MCG/ACT IN AERO
2.0000 | INHALATION_SPRAY | Freq: Two times a day (BID) | RESPIRATORY_TRACT | Status: DC
  Filled 2022-10-13: qty 8.8

## 2022-10-13 MED ORDER — ATORVASTATIN CALCIUM 10 MG PO TABS: 10.0000 mg | ORAL_TABLET | Freq: Every day | ORAL | Status: DC

## 2022-10-13 MED ORDER — OXYCODONE HCL 5 MG PO TABS: 5.0000 mg | ORAL_TABLET | ORAL | Status: DC | PRN

## 2022-10-13 MED ORDER — IOHEXOL 300 MG/ML  SOLN
100.0000 mL | Freq: Once | INTRAMUSCULAR | Status: AC | PRN
  Administered 2022-10-13: 100 mL via INTRAVENOUS

## 2022-10-13 MED ORDER — TAMSULOSIN HCL 0.4 MG PO CAPS: 0.4000 mg | ORAL_CAPSULE | Freq: Every day | ORAL | Status: DC

## 2022-10-13 MED ORDER — SENNOSIDES-DOCUSATE SODIUM 8.6-50 MG PO TABS: 1.0000 | ORAL_TABLET | Freq: Every evening | ORAL | Status: DC | PRN

## 2022-10-13 MED ORDER — ONDANSETRON HCL 4 MG/2ML IJ SOLN: 4.0000 mg | Freq: Four times a day (QID) | INTRAMUSCULAR | Status: DC | PRN

## 2022-10-13 MED ORDER — INSULIN GLARGINE-YFGN 100 UNIT/ML ~~LOC~~ SOLN
45.0000 [IU] | Freq: Every day | SUBCUTANEOUS | Status: DC
  Administered 2022-10-13: 45 [IU] via SUBCUTANEOUS
  Filled 2022-10-13 (×2): qty 0.45

## 2022-10-13 MED ORDER — RISPERIDONE 1 MG PO TABS: 4.0000 mg | ORAL_TABLET | Freq: Every day | ORAL | Status: DC

## 2022-10-13 MED ORDER — FUROSEMIDE 10 MG/ML IJ SOLN
40.0000 mg | Freq: Two times a day (BID) | INTRAMUSCULAR | Status: DC
  Administered 2022-10-14 (×2): 40 mg via INTRAVENOUS
  Filled 2022-10-13 (×2): qty 4

## 2022-10-13 MED ORDER — IPRATROPIUM-ALBUTEROL 0.5-2.5 (3) MG/3ML IN SOLN
3.0000 mL | Freq: Once | RESPIRATORY_TRACT | Status: AC
  Administered 2022-10-13: 3 mL via RESPIRATORY_TRACT
  Filled 2022-10-13: qty 3

## 2022-10-13 MED ORDER — FUROSEMIDE 10 MG/ML IJ SOLN
80.0000 mg | Freq: Once | INTRAMUSCULAR | Status: AC
  Administered 2022-10-13: 80 mg via INTRAVENOUS
  Filled 2022-10-13: qty 8

## 2022-10-13 MED ORDER — GABAPENTIN 300 MG PO CAPS
600.0000 mg | ORAL_CAPSULE | Freq: Two times a day (BID) | ORAL | Status: DC
  Administered 2022-10-13: 600 mg via ORAL
  Filled 2022-10-13: qty 2

## 2022-10-13 MED ORDER — ACETAMINOPHEN 650 MG RE SUPP: 650.0000 mg | Freq: Four times a day (QID) | RECTAL | Status: DC | PRN

## 2022-10-13 MED ORDER — INSULIN ASPART 100 UNIT/ML IJ SOLN
0.0000 [IU] | Freq: Every day | INTRAMUSCULAR | Status: DC
  Filled 2022-10-13: qty 0.05

## 2022-10-13 MED ORDER — ALBUTEROL SULFATE (2.5 MG/3ML) 0.083% IN NEBU: 3.0000 mL | INHALATION_SOLUTION | RESPIRATORY_TRACT | Status: DC | PRN

## 2022-10-13 MED ORDER — SODIUM CHLORIDE 0.9% FLUSH
3.0000 mL | Freq: Two times a day (BID) | INTRAVENOUS | Status: DC
  Administered 2022-10-13 – 2022-10-21 (×15): 3 mL via INTRAVENOUS

## 2022-10-13 MED ORDER — INSULIN ASPART 100 UNIT/ML IJ SOLN
0.0000 [IU] | Freq: Three times a day (TID) | INTRAMUSCULAR | Status: DC
  Filled 2022-10-13: qty 0.09

## 2022-10-13 MED ORDER — DULOXETINE HCL 30 MG PO CPEP: 60.0000 mg | ORAL_CAPSULE | Freq: Every day | ORAL | Status: DC

## 2022-10-13 MED ORDER — DOXYCYCLINE HYCLATE 100 MG PO TABS
100.0000 mg | ORAL_TABLET | Freq: Two times a day (BID) | ORAL | Status: DC
  Administered 2022-10-13: 100 mg via ORAL
  Filled 2022-10-13: qty 1

## 2022-10-13 MED ORDER — GABAPENTIN 300 MG PO CAPS: 300.0000 mg | ORAL_CAPSULE | ORAL | Status: DC

## 2022-10-13 MED ORDER — ACETAMINOPHEN 325 MG PO TABS: 650.0000 mg | ORAL_TABLET | Freq: Four times a day (QID) | ORAL | Status: DC | PRN

## 2022-10-13 MED ORDER — NICOTINE 21 MG/24HR TD PT24
21.0000 mg | MEDICATED_PATCH | Freq: Every day | TRANSDERMAL | Status: DC
  Administered 2022-10-13 – 2022-10-20 (×8): 21 mg via TRANSDERMAL
  Filled 2022-10-13 (×8): qty 1

## 2022-10-13 MED ORDER — HYDROXYZINE HCL 25 MG PO TABS: 25.0000 mg | ORAL_TABLET | Freq: Three times a day (TID) | ORAL | Status: DC | PRN

## 2022-10-13 MED ORDER — PANCRELIPASE (LIP-PROT-AMYL) 12000-38000 UNITS PO CPEP
12000.0000 [IU] | ORAL_CAPSULE | Freq: Three times a day (TID) | ORAL | Status: DC
  Filled 2022-10-13: qty 1

## 2022-10-13 MED ORDER — ONDANSETRON HCL 4 MG PO TABS: 4.0000 mg | ORAL_TABLET | Freq: Four times a day (QID) | ORAL | Status: DC | PRN

## 2022-10-13 MED ORDER — ENOXAPARIN SODIUM 40 MG/0.4ML IJ SOSY
40.0000 mg | PREFILLED_SYRINGE | INTRAMUSCULAR | Status: DC
  Administered 2022-10-13: 40 mg via SUBCUTANEOUS
  Filled 2022-10-13: qty 0.4

## 2022-10-13 NOTE — Telephone Encounter (Signed)
Appointment reminder for Tyge's podiatry appointment. Pt advised his feet still hurt. Pt was aware and was advised to call for transportation today. Pt was also sent a text for reference will all necessary information for making transportation reservation.   Benson Setting EMT-P Community Paramedic  [phone]

## 2022-10-13 NOTE — ED Triage Notes (Signed)
BIB [facility] from home c/o slip and fall in urine this AM onto floor, was able to get himself off of the floor and ambulate afterwards. C/o BLE pain and lower back.  Denies thinners, denies LOC or head trauma.

## 2022-10-13 NOTE — Telephone Encounter (Signed)
Terrelle called back advising that he had to re enroll in transportation to be able to schedule a ride. He told me he would be unable to make it to his podiatrist appointment. I informed him to go ahead an get re enrolled.    Pt called back to inform me that he was re enrolled and was in the process of securing a ride for Monday. Will follow up to ensure he will be able to attend.   Benson Setting EMT-P Community Paramedic  [phone]

## 2022-10-13 NOTE — ED Notes (Signed)
Patient covered in feces and urine. RN and NT cleaned patient. No bed bugs found. Will continue to monitor.

## 2022-10-13 NOTE — H&P (Addendum)
History and Physical    Raymond Hood FAO:[id number] DOB: 1970/12/14 DOA: 10/31/22  PCP: Storm Frisk, MD   Patient coming from: Home   Chief Complaint: Slip and fall, low back and leg pain  HPI: Raymond Hood is a 52 y.o. male with medical history significant for COPD, OSA/OHS, chronic HFpEF, type 2 diabetes mellitus, schizoaffective disorder, and BMI 56 who presents with bilateral leg and low back pain after a slip and fall at home.  Patient slipped in a puddle and fell to the floor today.  He has been able to bear weight and ambulate following the fall but is experiencing lower back and bilateral leg pain.  He also reports insidiously worsening shortness of breath and swelling despite reported adherence with his diuretic.  He reports that his cough is chronic.  He has had chills recently.  ED Course: Upon arrival to the ED, patient is found to be afebrile, requiring 4 L/min of supplemental oxygen, tachypneic, and slightly tachycardic with stable blood pressure.  Labs are most notable for glucose 284, albumin 3.0, WBC 11,300, and BNP 201.  No acute findings are noted on CT head or CT cervical spine.  CT of the chest/abdomen/pelvis is notable for diffuse body wall edema and small right pleural effusion.  Patient was treated with 80 mg IV Lasix and DuoNeb in the ED.  Review of Systems:  All other systems reviewed and apart from HPI, are negative.  Past Medical History:  Diagnosis Date   AKI (acute kidney injury) (HCC) 07/27/2021   Bipolar 1 disorder, depressed (HCC)    CAP (community acquired pneumonia) 07/14/2021   COPD (chronic obstructive pulmonary disease) (HCC)    Depression with anxiety    Diabetes mellitus type II, non insulin dependent (HCC) 11/2020   A1c 7.3   Fall at home, initial encounter 05/28/2020   Last Assessment & Plan:  Formatting of this note might be different from the original. Acute onset, occurred few days ago.  Reports having a mechanical fall, fell  forward injuring his left side including arm, knee.  Denies any vision changes, head trauma or loss of consciousness.  Patient is being referred to the emergency room for lower GI bleed, can also get evaluated for his injuries.   Gastric ulcer    when on NSAIDs   GERD (gastroesophageal reflux disease)    Helicobacter pylori antibody positive 07/04/2018   Formatting of this note might be different from the original. Started triple therapy 06/30/18.   Needs HP stool antigen in 8 weeks,  Off PPI for 10 days.   HTN (hypertension)    OSA (obstructive sleep apnea)    not currently on CPAP   Rheumatoid arthritis (HCC)    Right kidney mass, 2.8 cm 11/2020   Tobacco abuse     Past Surgical History:  Procedure Laterality Date   left index finger surgery Left     Social History:   reports that he has been smoking cigarettes. He has never used smokeless tobacco. He reports that he does not currently use alcohol. He reports that he does not use drugs.  No Known Allergies  Family History  Problem Relation Age of Onset   Schizophrenia Mother    Diabetes Mellitus II Father      Prior to Admission medications   Medication Sig Start Date End Date Taking? Authorizing Provider  atorvastatin (LIPITOR) 10 MG tablet Take 1 tablet (10 mg total) by mouth once daily. 06/23/22   Storm Frisk, MD  Blood Glucose Monitoring Suppl (ACCU-CHEK GUIDE) w/Device KIT Use up to four times daily as directed. 01/27/22   Storm Frisk, MD  Continuous Glucose Receiver (DEXCOM G7 RECEIVER) DEVI Use to check blood sugar throughout the day. E11.65 09/16/22   Storm Frisk, MD  Continuous Glucose Sensor (DEXCOM G7 SENSOR) MISC Use to check blood sugar throughout the day. Change sensors once every 10 days. E11.65 09/16/22   Storm Frisk, MD  CREON 12000-38000 units CPEP capsule Take 1 capsule (12,000 Units total) by mouth 3 (three) times daily. 06/23/22   Storm Frisk, MD  diphenoxylate-atropine (LOMOTIL)  2.5-0.025 MG tablet Take 1 tablet by mouth 4 (four) times daily as needed for diarrhea or loose stools. 01/14/22   Storm Frisk, MD  DULoxetine (CYMBALTA) 60 MG capsule Take 1 capsule (60 mg total) by mouth daily. 06/23/22   Storm Frisk, MD  EASY COMFORT PEN NEEDLES 31G X 5 MM MISC USE AS DIRECTED WITH INSULIN PEN 06/22/22   Storm Frisk, MD  empagliflozin (JARDIANCE) 10 MG TABS tablet Take 1 tablet (10 mg total) by mouth daily before breakfast. 09/20/22   Storm Frisk, MD  famotidine (PEPCID) 20 MG tablet Take 1 tablet (20 mg total) by mouth 2 (two) times daily. 06/23/22   Storm Frisk, MD  Fingerstix Lancets MISC Use as directed up to 4 times daily. 02/01/22   Storm Frisk, MD  fluticasone (FLONASE) 50 MCG/ACT nasal spray Place 2 sprays into both nostrils daily. 01/27/22   Storm Frisk, MD  furosemide (LASIX) 40 MG tablet Take 1 tablet (40 mg total) by mouth daily. 06/23/22   Storm Frisk, MD  gabapentin (NEURONTIN) 300 MG capsule TAKE 2 CAPSULES IN THE MORNING AND 1 CAPSULE MIDDAY AND 2 CAPSULES EVERY EVENING. (2AM+1N+2PM) 08/16/22   Storm Frisk, MD  glucose blood test strip Use four times daily as directed. 02/01/22   Storm Frisk, MD  hydrOXYzine (ATARAX) 25 MG tablet TAKE 1 TABLET (25 MG TOTAL) BY MOUTH 3 (THREE) TIMES DAILY AS NEEDED. (AM+NOON+BEDTIME) 09/07/22   Storm Frisk, MD  insulin aspart protamine - aspart (NOVOLOG MIX 70/30 FLEXPEN) (70-30) 100 UNIT/ML FlexPen Inject 45 Units into the skin 2 (two) times daily with a meal. 10/05/22   Storm Frisk, MD  metFORMIN (GLUCOPHAGE) 500 MG tablet Take 2 tablets (1,000 mg total) by mouth 2 (two) times daily with a meal. 06/23/22   Storm Frisk, MD  Multiple Vitamins-Minerals (PRESERVISION AREDS 2) CAPS Take 1 capsule by mouth 2 (two) times daily with food. Take 1 in the morning and 1 in the evening. Patient not taking: Reported on 09/26/2022 02/01/22   Storm Frisk, MD  nicotine (NICODERM CQ  - DOSED IN MG/24 HOURS) 21 mg/24hr patch Place 1 patch (21 mg total) onto the skin daily. Patient not taking: Reported on 07/21/2022 03/08/22   Storm Frisk, MD  nicotine polacrilex (NICORETTE MINI) 4 MG lozenge Use 3 times a day to quit smoking Patient not taking: Reported on 07/21/2022 02/24/22   Storm Frisk, MD  olopatadine (PATANOL) 0.1 % ophthalmic solution Place 1 drop into the right eye 2 (two) times daily. Patient not taking: Reported on 07/21/2022 03/25/21   Storm Frisk, MD  polyethylene glycol powder [facility]) 17 GM/SCOOP powder Take 17 g by mouth daily as needed for moderate constipation or severe constipation. 12/06/21   Storm Frisk, MD  risperidone (RISPERDAL) 4 MG tablet Take 1  tablet (4 mg total) by mouth once daily. 06/23/22   Storm Frisk, MD  SYMBICORT 160-4.5 MCG/ACT inhaler Inhale 2 puffs into the lungs 2 (two) times daily. 10/11/22   Storm Frisk, MD  tamsulosin (FLOMAX) 0.4 MG CAPS capsule Take 1 capsule (0.4 mg total) by mouth daily. 06/23/22   Storm Frisk, MD  VENTOLIN HFA 108 (90 Base) MCG/ACT inhaler INHALE 2 PUFFS INTO THE LUNGS EVERY 6 (SIX) HOURS AS NEEDED FOR WHEEZING OR SHORTNESS OF BREATH. 08/11/22   Storm Frisk, MD    Physical Exam: Vitals:   10/16/2022 1428 11/10/2022 1545 11/10/2022 1835 10/16/2022 1845  BP: (!) 146/89 138/83 108/82   Pulse: (!) 109 (!) 101  (!) 109  Resp: (!) 28 (!) 22 15 18   Temp: (!) 97.5 F (36.4 C)  98.6 F (37 C)   TempSrc: Oral  Oral   SpO2: 93% 93% 95% 96%    Constitutional: NAD, no pallor or diaphoresis  Eyes: PERTLA, lids and conjunctivae normal ENMT: Mucous membranes are moist. Posterior pharynx clear of any exudate or lesions.   Neck: supple, no masses  Respiratory: Diminished bilaterally, prolonged expiratory phase. Dyspneic with speech.  Cardiovascular: S1 & S2 heard, regular rate and rhythm. Peripheral edema.  Abdomen: Soft, non-tender. Bowel sounds active.  Musculoskeletal: no  clubbing / cyanosis. No joint deformity upper and lower extremities.   Skin: Serous weeping and maceration involving lower leg with erythema, heat, and scant purulent drainage from lower RLE. Warm, dry, well-perfused. Neurologic: CN 2-12 grossly intact. Moving all extremities. Alert and oriented.  Psychiatric: Calm. Cooperative.    Labs and Imaging on Admission: I have personally reviewed following labs and imaging studies  CBC: Recent Labs  Lab 10/20/2022 1640  WBC 11.3*  NEUTROABS 8.4*  HGB 15.8  HCT 53.6*  MCV 91.0  PLT 279   Basic Metabolic Panel: Recent Labs  Lab 10/16/2022 1640  NA 139  K 4.4  CL 97*  CO2 32  GLUCOSE 284*  BUN 18  CREATININE 0.96  CALCIUM 7.8*   GFR: CrCl cannot be calculated (Unknown ideal weight.). Liver Function Tests: Recent Labs  Lab 10/23/2022 1640  AST 20  ALT 22  ALKPHOS 81  BILITOT 0.5  PROT 6.3*  ALBUMIN 3.0*   No results for input(s): "LIPASE", "AMYLASE" in the last 168 hours. No results for input(s): "AMMONIA" in the last 168 hours. Coagulation Profile: No results for input(s): "INR", "PROTIME" in the last 168 hours. Cardiac Enzymes: No results for input(s): "CKTOTAL", "CKMB", "CKMBINDEX", "TROPONINI" in the last 168 hours. BNP (last 3 results) No results for input(s): "PROBNP" in the last 8760 hours. HbA1C: No results for input(s): "HGBA1C" in the last 72 hours. CBG: No results for input(s): "GLUCAP" in the last 168 hours. Lipid Profile: No results for input(s): "CHOL", "HDL", "LDLCALC", "TRIG", "CHOLHDL", "LDLDIRECT" in the last 72 hours. Thyroid Function Tests: No results for input(s): "TSH", "T4TOTAL", "FREET4", "T3FREE", "THYROIDAB" in the last 72 hours. Anemia Panel: No results for input(s): "VITAMINB12", "FOLATE", "FERRITIN", "TIBC", "IRON", "RETICCTPCT" in the last 72 hours. Urine analysis:    Component Value Date/Time   COLORURINE YELLOW 10/31/2022 1850   APPEARANCEUR CLEAR 11/01/2022 1850   LABSPEC 1.029  10/19/2022 1850   PHURINE 6.0 10/17/2022 1850   GLUCOSEU >=500 (A) 10/18/2022 1850   HGBUR NEGATIVE 10/20/2022 1850   BILIRUBINUR NEGATIVE 11/03/2022 1850   KETONESUR NEGATIVE 11/05/2022 1850   PROTEINUR NEGATIVE 10/17/2022 1850   NITRITE NEGATIVE 10/12/2022 1850   LEUKOCYTESUR  NEGATIVE 10/12/2022 1850   Sepsis Labs: @LABRCNTIP (procalcitonin:4,lacticidven:4) )No results found for this or any previous visit (from the past 240 hour(s)).   Radiological Exams on Admission: CT CHEST ABDOMEN PELVIS W CONTRAST  Result Date: 11/10/2022 CLINICAL DATA:  Fall.  Blunt poly trauma.  Chest and abdominal pain. EXAM: CT CHEST, ABDOMEN, AND PELVIS WITH CONTRAST TECHNIQUE: Multidetector CT imaging of the chest, abdomen and pelvis was performed following the standard protocol during bolus administration of intravenous contrast. RADIATION DOSE REDUCTION: This exam was performed according to the departmental dose-optimization program which includes automated exposure control, adjustment of the mA and/or kV according to patient size and/or use of iterative reconstruction technique. CONTRAST:  OMNIPAQUE IOHEXOL 300 MG/ML  SOLN COMPARISON:  AP only CT on 11/16/2020 FINDINGS: CT CHEST FINDINGS Cardiovascular: No evidence of thoracic aortic injury or mediastinal hematoma. No pericardial effusion. Mediastinum/Nodes: No evidence of hemorrhage or pneumomediastinum. No masses or pathologically enlarged lymph nodes identified. Lungs/Pleura: No evidence of pulmonary contusion or other infiltrate. No evidence of pneumothorax. Small right pleural effusion and dependent right lung atelectasis noted. Musculoskeletal: No acute fractures or suspicious bone lesions identified. CT ABDOMEN PELVIS FINDINGS Hepatobiliary: No hepatic laceration or mass identified. Gallbladder is unremarkable. No evidence of biliary ductal dilatation. Pancreas: No parenchymal laceration, mass, or inflammatory changes identified. Spleen: No evidence of  splenic laceration. Adrenal/Urinary Tract: Stable bilateral adrenal masses, consistent with benign adenomas no hemorrhage or parenchymal lacerations identified. No evidence of suspicious masses. Several tiny less than 5 mm right renal calculi are again seen. Bosniak category 1 and 2 renal cysts remains stable (No followup imaging is recommended). No evidence of ureteral calculi or hydronephrosis. Stomach/Bowel: Unopacified bowel loops are unremarkable in appearance. No evidence of hemoperitoneum. Vascular/Lymphatic: No evidence of abdominal aortic injury or retroperitoneal hemorrhage. No pathologically enlarged lymph nodes identified. Reproductive:  No mass or other significant abnormality identified. Other:  New diffuse body wall edema compared to prior exam. Musculoskeletal: No acute fractures or suspicious bone lesions identified. IMPRESSION: No evidence of traumatic injury. Small right pleural effusion and dependent right lung atelectasis. Diffuse body wall edema. Stable bilateral adrenal adenomas (No followup imaging is recommended). Tiny nonobstructing right renal calculi. Electronically Signed   By: Danae Orleans M.D.   On: 11/04/2022 19:25   CT Head Wo Contrast  Result Date: 10/29/2022 CLINICAL DATA:  Fall.  Neck pain. EXAM: CT HEAD WITHOUT CONTRAST CT CERVICAL SPINE WITHOUT CONTRAST TECHNIQUE: Multidetector CT imaging of the head and cervical spine was performed following the standard protocol without intravenous contrast. Multiplanar CT image reconstructions of the cervical spine were also generated. RADIATION DOSE REDUCTION: This exam was performed according to the departmental dose-optimization program which includes automated exposure control, adjustment of the mA and/or kV according to patient size and/or use of iterative reconstruction technique. COMPARISON:  None Available. FINDINGS: CT HEAD FINDINGS Brain: No evidence of acute infarction, hemorrhage, hydrocephalus, extra-axial collection or mass  lesion/mass effect. Vascular: No hyperdense vessel or unexpected calcification. Skull: Normal. Negative for fracture or focal lesion. Sinuses/Orbits: No acute finding. Other: None. CT CERVICAL SPINE FINDINGS Limited study due to motion artifact. Alignment: Straightening of the normal cervical lordosis. No traumatic malalignment. Skull base and vertebrae: No acute fracture. No primary bone lesion or focal pathologic process. Soft tissues and spinal canal: No prevertebral fluid or swelling. No visible canal hematoma. Disc levels: Multilevel disc height loss and uncovertebral hypertrophy, greatest at C5-C6 and C6-C7. Upper chest: Negative. Other: None. IMPRESSION: 1. No acute intracranial abnormality. 2. Limited  evaluation of the cervical spine due to motion artifact. No definite acute fracture or traumatic listhesis. Electronically Signed   By: Obie Dredge M.D.   On: Oct 29, 2022 17:24   CT Cervical Spine Wo Contrast  Result Date: 2022/10/29 CLINICAL DATA:  Fall.  Neck pain. EXAM: CT HEAD WITHOUT CONTRAST CT CERVICAL SPINE WITHOUT CONTRAST TECHNIQUE: Multidetector CT imaging of the head and cervical spine was performed following the standard protocol without intravenous contrast. Multiplanar CT image reconstructions of the cervical spine were also generated. RADIATION DOSE REDUCTION: This exam was performed according to the departmental dose-optimization program which includes automated exposure control, adjustment of the mA and/or kV according to patient size and/or use of iterative reconstruction technique. COMPARISON:  None Available. FINDINGS: CT HEAD FINDINGS Brain: No evidence of acute infarction, hemorrhage, hydrocephalus, extra-axial collection or mass lesion/mass effect. Vascular: No hyperdense vessel or unexpected calcification. Skull: Normal. Negative for fracture or focal lesion. Sinuses/Orbits: No acute finding. Other: None. CT CERVICAL SPINE FINDINGS Limited study due to motion artifact. Alignment:  Straightening of the normal cervical lordosis. No traumatic malalignment. Skull base and vertebrae: No acute fracture. No primary bone lesion or focal pathologic process. Soft tissues and spinal canal: No prevertebral fluid or swelling. No visible canal hematoma. Disc levels: Multilevel disc height loss and uncovertebral hypertrophy, greatest at C5-C6 and C6-C7. Upper chest: Negative. Other: None. IMPRESSION: 1. No acute intracranial abnormality. 2. Limited evaluation of the cervical spine due to motion artifact. No definite acute fracture or traumatic listhesis. Electronically Signed   By: Obie Dredge M.D.   On: 10-29-22 17:24    EKG: Independently reviewed. Sinus tachycardia, rate 107.   Assessment/Plan   1. Acute on chronic diastolic CHF; acute hypoxic respiratory failure - Continue diuresis with IV Lasix, monitor weight and I/Os, monitor renal function and electrolytes, update echo, continue supplemental O2 as needed    2. COPD; OSA  - Does not appear to be in exacerbation on admission   - Continue ICS-LABA, as-needed albuterol, CPAP while sleeping    3. Type II DM  - A1c was 7.3% in June 2024  - Check CBGs, use long- and short-acting insulin   4. Depression, anxiety  - Continue Cymbalta, Risperdal, and as-needed hydroxyzine   5. RLE cellulitis  - Chronic dermatitis with secondary bacterial cellulitis involving lower RLE  - Start doxycycline    DVT prophylaxis: Lovenox  Code Status: Fall  Level of Care: Level of care: Telemetry Family Communication: None present  Disposition Plan:  Patient is from: home  Anticipated d/c is to: TBD Anticipated d/c date is: 10/17/22  Patient currently: Pending improved volume status  Consults called: None  Admission status: Inpatient     Briscoe Deutscher, MD [facility]  October 29, 2022, 8:52 PM

## 2022-10-13 NOTE — ED Provider Notes (Signed)
Patient seen in conjunction with PA Melina Modena who presents to the ER after a mechanical fall in his bathroom. Was able to get up himself. Presented to ER covered in his own urine and feces. Pt states he takes lasix daily and has been compliant with this. He has noted more swelling to his legs and abdomen over the past week or so, with decreased urine output. He has also noted his bilateral legs have been more red over the past week. Denies fever or chills. Has hx of COPD and has been wheezing, feels he needs his inhaler/breathing treatments. Nonproductive cough as well. Does not wear any oxygen at home, presented at 88% on room air, put on 4 L [place] and maintaining normal saturations on this.   Trauma scans without traumatic findings. Diffuse anasarca and R pleural effusion on CT. Minimal leukocytosis at 11.3, + BNP to 201.4. Glucose 284.   Physical Exam  BP 108/82   Pulse (!) 109   Temp 98.6 F (37 C) (Oral)   Resp 18   SpO2 96%   Physical Exam Vitals and nursing note reviewed.  Constitutional:      Appearance: Normal appearance. He is morbidly obese.     Comments: Chronically ill appearing  HENT:     Head: Normocephalic and atraumatic.  Eyes:     Conjunctiva/sclera: Conjunctivae normal.  Cardiovascular:     Rate and Rhythm: Normal rate and regular rhythm.  Pulmonary:     Effort: Pulmonary effort is normal. No respiratory distress.     Breath sounds: Wheezing and rales present.     Comments: Wheezing in all fields, rales in lower lobes Abdominal:     General: There is no distension.     Palpations: Abdomen is soft.     Tenderness: There is no abdominal tenderness.  Skin:    General: Skin is warm and dry.     Comments: Some overlying chronic skin changes including plaques and discoloration to the bilateral legs. New erythema, edema, and weeping of the legs, R>L as imaged below  Neurological:     General: No focal deficit present.     Mental Status: He is alert.     ED Course /  MDM   Clinical Course as of 11/01/2022 2044  Thu 2022/11/01  1657 Stable 51 YOM with GLF TBP. Larey Seat at home.   [CC]    Clinical Course User Index [CC] Glyn Ade, MD   Medical Decision Making Amount and/or Complexity of Data Reviewed Labs: ordered. Radiology: ordered.  Risk Prescription drug management. Decision regarding hospitalization.  After consideration of the diagnostic results and the patients response to treatment, I feel that patient is requiring admission for acute respiratory failure with hypoxia, likely in the setting of volume overload. + BNP, anasarca and R pleural effusion on CT. I consulted with hospitalist Dr Antionette Char who will admit.    Jeanella Flattery 11/01/2022 0102    Glyn Ade, MD 10/18/22 1258

## 2022-10-13 NOTE — ED Provider Notes (Addendum)
[facility] Provider Note   CSN: [id number] Arrival date & time: 30-Oct-2022  1410     History  Chief Complaint  Patient presents with   Back Pain    Raymond Hood is a 52 y.o. male with a past medical history significant for COPD, diabetes, low back pain, hypertension, diastolic heart failure, obstructive sleep apnea who presents today for slip and fall this morning.  Patient was able to get himself off the floor and ambulate afterwards.  He complains of bilateral lower extremity pain and low back pain.  He states he does not know if he lost consciousness or hit his head.  He denies being on blood thinners.   Back Pain Associated symptoms: no fever        Home Medications Prior to Admission medications   Medication Sig Start Date End Date Taking? Authorizing Provider  atorvastatin (LIPITOR) 10 MG tablet Take 1 tablet (10 mg total) by mouth once daily. 06/23/22   Storm Frisk, MD  Blood Glucose Monitoring Suppl (ACCU-CHEK GUIDE) w/Device KIT Use up to four times daily as directed. 01/27/22   Storm Frisk, MD  Continuous Glucose Receiver (DEXCOM G7 RECEIVER) DEVI Use to check blood sugar throughout the day. E11.65 09/16/22   Storm Frisk, MD  Continuous Glucose Sensor (DEXCOM G7 SENSOR) MISC Use to check blood sugar throughout the day. Change sensors once every 10 days. E11.65 09/16/22   Storm Frisk, MD  CREON 12000-38000 units CPEP capsule Take 1 capsule (12,000 Units total) by mouth 3 (three) times daily. 06/23/22   Storm Frisk, MD  diphenoxylate-atropine (LOMOTIL) 2.5-0.025 MG tablet Take 1 tablet by mouth 4 (four) times daily as needed for diarrhea or loose stools. 01/14/22   Storm Frisk, MD  DULoxetine (CYMBALTA) 60 MG capsule Take 1 capsule (60 mg total) by mouth daily. 06/23/22   Storm Frisk, MD  EASY COMFORT PEN NEEDLES 31G X 5 MM MISC USE AS DIRECTED WITH INSULIN PEN 06/22/22   Storm Frisk, MD   empagliflozin (JARDIANCE) 10 MG TABS tablet Take 1 tablet (10 mg total) by mouth daily before breakfast. 09/20/22   Storm Frisk, MD  famotidine (PEPCID) 20 MG tablet Take 1 tablet (20 mg total) by mouth 2 (two) times daily. 06/23/22   Storm Frisk, MD  Fingerstix Lancets MISC Use as directed up to 4 times daily. 02/01/22   Storm Frisk, MD  fluticasone (FLONASE) 50 MCG/ACT nasal spray Place 2 sprays into both nostrils daily. 01/27/22   Storm Frisk, MD  furosemide (LASIX) 40 MG tablet Take 1 tablet (40 mg total) by mouth daily. 06/23/22   Storm Frisk, MD  gabapentin (NEURONTIN) 300 MG capsule TAKE 2 CAPSULES IN THE MORNING AND 1 CAPSULE MIDDAY AND 2 CAPSULES EVERY EVENING. (2AM+1N+2PM) 08/16/22   Storm Frisk, MD  glucose blood test strip Use four times daily as directed. 02/01/22   Storm Frisk, MD  hydrOXYzine (ATARAX) 25 MG tablet TAKE 1 TABLET (25 MG TOTAL) BY MOUTH 3 (THREE) TIMES DAILY AS NEEDED. (AM+NOON+BEDTIME) 09/07/22   Storm Frisk, MD  insulin aspart protamine - aspart (NOVOLOG MIX 70/30 FLEXPEN) (70-30) 100 UNIT/ML FlexPen Inject 45 Units into the skin 2 (two) times daily with a meal. 10/05/22   Storm Frisk, MD  metFORMIN (GLUCOPHAGE) 500 MG tablet Take 2 tablets (1,000 mg total) by mouth 2 (two) times daily with a meal. 06/23/22  Storm Frisk, MD  Multiple Vitamins-Minerals (PRESERVISION AREDS 2) CAPS Take 1 capsule by mouth 2 (two) times daily with food. Take 1 in the morning and 1 in the evening. Patient not taking: Reported on 09/26/2022 02/01/22   Storm Frisk, MD  nicotine (NICODERM CQ - DOSED IN MG/24 HOURS) 21 mg/24hr patch Place 1 patch (21 mg total) onto the skin daily. Patient not taking: Reported on 07/21/2022 03/08/22   Storm Frisk, MD  nicotine polacrilex (NICORETTE MINI) 4 MG lozenge Use 3 times a day to quit smoking Patient not taking: Reported on 07/21/2022 02/24/22   Storm Frisk, MD  olopatadine (PATANOL) 0.1 %  ophthalmic solution Place 1 drop into the right eye 2 (two) times daily. Patient not taking: Reported on 07/21/2022 03/25/21   Storm Frisk, MD  polyethylene glycol powder [facility]) 17 GM/SCOOP powder Take 17 g by mouth daily as needed for moderate constipation or severe constipation. 12/06/21   Storm Frisk, MD  risperidone (RISPERDAL) 4 MG tablet Take 1 tablet (4 mg total) by mouth once daily. 06/23/22   Storm Frisk, MD  SYMBICORT 160-4.5 MCG/ACT inhaler Inhale 2 puffs into the lungs 2 (two) times daily. 10/11/22   Storm Frisk, MD  tamsulosin (FLOMAX) 0.4 MG CAPS capsule Take 1 capsule (0.4 mg total) by mouth daily. 06/23/22   Storm Frisk, MD  VENTOLIN HFA 108 (90 Base) MCG/ACT inhaler INHALE 2 PUFFS INTO THE LUNGS EVERY 6 (SIX) HOURS AS NEEDED FOR WHEEZING OR SHORTNESS OF BREATH. 08/11/22   Storm Frisk, MD      Allergies    Patient has no known allergies.    Review of Systems   Review of Systems  Constitutional:  Negative for fever.  Respiratory:  Positive for shortness of breath.   Gastrointestinal:  Negative for vomiting.  Musculoskeletal:  Positive for back pain and neck pain.    Physical Exam Updated Vital Signs BP 108/82   Pulse (!) 109   Temp 98.6 F (37 C) (Oral)   Resp 18   SpO2 96%  Physical Exam Vitals and nursing note reviewed.  Constitutional:      General: He is not in acute distress.    Appearance: He is well-developed. He is obese.  HENT:     Head: Normocephalic and atraumatic.     Mouth/Throat:     Mouth: Mucous membranes are moist.  Eyes:     Conjunctiva/sclera: Conjunctivae normal.  Cardiovascular:     Rate and Rhythm: Regular rhythm. Tachycardia present.  Pulmonary:     Effort: Pulmonary effort is normal. No respiratory distress.     Breath sounds: Normal breath sounds.  Abdominal:     General: Abdomen is protuberant.     Palpations: Abdomen is soft.     Tenderness: There is no abdominal tenderness.   Musculoskeletal:        General: Tenderness present.     Cervical back: Neck supple.     Right lower leg: Edema present.     Left lower leg: Edema present.     Comments: Patient diffusely tender on spine.  Patient able to have full range of motion of neck.  Patient able to roll himself on his side for evaluation of spine.  Patient was able to ambulate after fall.  Skin:    General: Skin is warm and dry.     Comments: Chronic skin changes over bilateral lower extremities  Neurological:     General: No focal  deficit present.     Mental Status: He is alert.  Psychiatric:        Mood and Affect: Mood normal.     ED Results / Procedures / Treatments   Labs (all labs ordered are listed, but only abnormal results are displayed) Labs Reviewed  COMPREHENSIVE METABOLIC PANEL - Abnormal; Notable for the following components:      Result Value   Chloride 97 (*)    Glucose, Bld 284 (*)    Calcium 7.8 (*)    Total Protein 6.3 (*)    Albumin 3.0 (*)    All other components within normal limits  BRAIN NATRIURETIC PEPTIDE - Abnormal; Notable for the following components:   B Natriuretic Peptide 201.4 (*)    All other components within normal limits  CBC WITH DIFFERENTIAL/PLATELET - Abnormal; Notable for the following components:   WBC 11.3 (*)    RBC 5.89 (*)    HCT 53.6 (*)    MCHC 29.5 (*)    RDW 17.0 (*)    Neutro Abs 8.4 (*)    Abs Immature Granulocytes 0.21 (*)    All other components within normal limits  URINALYSIS, ROUTINE W REFLEX MICROSCOPIC - Abnormal; Notable for the following components:   Glucose, UA >=500 (*)    All other components within normal limits    EKG None  Radiology CT CHEST ABDOMEN PELVIS W CONTRAST  Result Date: November 05, 2022 CLINICAL DATA:  Fall.  Blunt poly trauma.  Chest and abdominal pain. EXAM: CT CHEST, ABDOMEN, AND PELVIS WITH CONTRAST TECHNIQUE: Multidetector CT imaging of the chest, abdomen and pelvis was performed following the standard protocol  during bolus administration of intravenous contrast. RADIATION DOSE REDUCTION: This exam was performed according to the departmental dose-optimization program which includes automated exposure control, adjustment of the mA and/or kV according to patient size and/or use of iterative reconstruction technique. CONTRAST:  OMNIPAQUE IOHEXOL 300 MG/ML  SOLN COMPARISON:  AP only CT on 11/16/2020 FINDINGS: CT CHEST FINDINGS Cardiovascular: No evidence of thoracic aortic injury or mediastinal hematoma. No pericardial effusion. Mediastinum/Nodes: No evidence of hemorrhage or pneumomediastinum. No masses or pathologically enlarged lymph nodes identified. Lungs/Pleura: No evidence of pulmonary contusion or other infiltrate. No evidence of pneumothorax. Small right pleural effusion and dependent right lung atelectasis noted. Musculoskeletal: No acute fractures or suspicious bone lesions identified. CT ABDOMEN PELVIS FINDINGS Hepatobiliary: No hepatic laceration or mass identified. Gallbladder is unremarkable. No evidence of biliary ductal dilatation. Pancreas: No parenchymal laceration, mass, or inflammatory changes identified. Spleen: No evidence of splenic laceration. Adrenal/Urinary Tract: Stable bilateral adrenal masses, consistent with benign adenomas no hemorrhage or parenchymal lacerations identified. No evidence of suspicious masses. Several tiny less than 5 mm right renal calculi are again seen. Bosniak category 1 and 2 renal cysts remains stable (No followup imaging is recommended). No evidence of ureteral calculi or hydronephrosis. Stomach/Bowel: Unopacified bowel loops are unremarkable in appearance. No evidence of hemoperitoneum. Vascular/Lymphatic: No evidence of abdominal aortic injury or retroperitoneal hemorrhage. No pathologically enlarged lymph nodes identified. Reproductive:  No mass or other significant abnormality identified. Other:  New diffuse body wall edema compared to prior exam. Musculoskeletal:  No acute fractures or suspicious bone lesions identified. IMPRESSION: No evidence of traumatic injury. Small right pleural effusion and dependent right lung atelectasis. Diffuse body wall edema. Stable bilateral adrenal adenomas (No followup imaging is recommended). Tiny nonobstructing right renal calculi. Electronically Signed   By: Danae Orleans M.D.   On: November 05, 2022 19:25   CT  Head Wo Contrast  Result Date: 10/25/2022 CLINICAL DATA:  Fall.  Neck pain. EXAM: CT HEAD WITHOUT CONTRAST CT CERVICAL SPINE WITHOUT CONTRAST TECHNIQUE: Multidetector CT imaging of the head and cervical spine was performed following the standard protocol without intravenous contrast. Multiplanar CT image reconstructions of the cervical spine were also generated. RADIATION DOSE REDUCTION: This exam was performed according to the departmental dose-optimization program which includes automated exposure control, adjustment of the mA and/or kV according to patient size and/or use of iterative reconstruction technique. COMPARISON:  None Available. FINDINGS: CT HEAD FINDINGS Brain: No evidence of acute infarction, hemorrhage, hydrocephalus, extra-axial collection or mass lesion/mass effect. Vascular: No hyperdense vessel or unexpected calcification. Skull: Normal. Negative for fracture or focal lesion. Sinuses/Orbits: No acute finding. Other: None. CT CERVICAL SPINE FINDINGS Limited study due to motion artifact. Alignment: Straightening of the normal cervical lordosis. No traumatic malalignment. Skull base and vertebrae: No acute fracture. No primary bone lesion or focal pathologic process. Soft tissues and spinal canal: No prevertebral fluid or swelling. No visible canal hematoma. Disc levels: Multilevel disc height loss and uncovertebral hypertrophy, greatest at C5-C6 and C6-C7. Upper chest: Negative. Other: None. IMPRESSION: 1. No acute intracranial abnormality. 2. Limited evaluation of the cervical spine due to motion artifact. No definite  acute fracture or traumatic listhesis. Electronically Signed   By: Obie Dredge M.D.   On: 10/24/2022 17:24   CT Cervical Spine Wo Contrast  Result Date: 10/18/2022 CLINICAL DATA:  Fall.  Neck pain. EXAM: CT HEAD WITHOUT CONTRAST CT CERVICAL SPINE WITHOUT CONTRAST TECHNIQUE: Multidetector CT imaging of the head and cervical spine was performed following the standard protocol without intravenous contrast. Multiplanar CT image reconstructions of the cervical spine were also generated. RADIATION DOSE REDUCTION: This exam was performed according to the departmental dose-optimization program which includes automated exposure control, adjustment of the mA and/or kV according to patient size and/or use of iterative reconstruction technique. COMPARISON:  None Available. FINDINGS: CT HEAD FINDINGS Brain: No evidence of acute infarction, hemorrhage, hydrocephalus, extra-axial collection or mass lesion/mass effect. Vascular: No hyperdense vessel or unexpected calcification. Skull: Normal. Negative for fracture or focal lesion. Sinuses/Orbits: No acute finding. Other: None. CT CERVICAL SPINE FINDINGS Limited study due to motion artifact. Alignment: Straightening of the normal cervical lordosis. No traumatic malalignment. Skull base and vertebrae: No acute fracture. No primary bone lesion or focal pathologic process. Soft tissues and spinal canal: No prevertebral fluid or swelling. No visible canal hematoma. Disc levels: Multilevel disc height loss and uncovertebral hypertrophy, greatest at C5-C6 and C6-C7. Upper chest: Negative. Other: None. IMPRESSION: 1. No acute intracranial abnormality. 2. Limited evaluation of the cervical spine due to motion artifact. No definite acute fracture or traumatic listhesis. Electronically Signed   By: Obie Dredge M.D.   On: 11/02/2022 17:24    Procedures Procedures    Medications Ordered in ED Medications  ipratropium-albuterol (DUONEB) 0.5-2.5 (3) MG/3ML nebulizer solution 3  mL (has no administration in time range)  furosemide (LASIX) injection 80 mg (has no administration in time range)  iohexol (OMNIPAQUE) 300 MG/ML solution 100 mL (100 mLs Intravenous Contrast Given 10/14/2022 1805)    ED Course/ Medical Decision Making/ A&P Clinical Course as of 10/27/2022 2043  Thu Oct 13, 2022  1657 Stable 51 YOM with GLF TBP. Larey Seat at home.   [CC]    Clinical Course User Index [CC] Glyn Ade, MD  Medical Decision Making Amount and/or Complexity of Data Reviewed Labs: ordered. Radiology: ordered.  Risk Prescription drug management. Decision regarding hospitalization.  This patient presents to the ED with chief complaint(s) of fall with pertinent past medical history of COPD, diabetes, low back pain, hypertension, diastolic heart failure, obstructive sleep apnea which further complicates the presenting complaint. The complaint involves an extensive differential diagnosis and also carries with it a high risk of complications and morbidity.    The differential diagnosis includes   Additional history obtained: Records reviewed previous progress notes  ED Course and Reassessment: Patient on 1 L nasal cannula as he was satting in the high 80s on room air  Independent labs interpretation:  The following labs were independently interpreted:  CBC: Elevated WBCs which is chronic CMP: Mild hypokalemia, hyperglycemia which patient history of diabetes BNP: Mildly elevated 201.4 UA: No notable findings   Independent visualization of imaging: - I independently visualized the following imaging with scope of interpretation limited to determining acute life threatening conditions related to emergency care: CT head, CT cervical spine, which revealed no acute findings, limited due to motion artifact, multilevel disc height loss and uncovertebral hypertrophy, CT chest abdomen pelvis with contrast which showed no evidence of traumatic  injury, small right pleural effusion and dependent right lung atelectasis, stable bilateral adrenal adenomas, tiny nonobstructing right renal calculi  Consultation: - Consulted or discussed management/test interpretation w/ external professional: Hospitalist  Consideration for admission or further workup: Would like to admit for hypoxia and fluid overload, hospitalist agrees.  PT discussed with Dr. Antionette Char and accepted for admission.    Final Clinical Impression(s) / ED Diagnoses Final diagnoses:  Acute respiratory failure with hypoxia (HCC)  Hypervolemia, unspecified hypervolemia type  Acute on chronic congestive heart failure, unspecified heart failure type [facility])    Rx / DC Orders ED Discharge Orders     None         Dolphus Jenny, PA-C 10/16/2022 2045    Dolphus Jenny, PA-C 10/15/2022 2058    Rexford Maus, DO 10/14/22 [phone]

## 2022-10-13 NOTE — Telephone Encounter (Signed)
Pt contacted me and advised that he had slipped on the floor of his bathroom and fell to the floor in a split. Pt advised that he hurt his legs and back upon impact. Pt stated that he was able to pull himself up to the toilet, but unable to get up from the toilet. Pt requested assistance, but then advised that he wanted to go to the hospital. I initiated care and and noted no obvious injuries. I called for an ambulance to transport him to [facility].   Will follow up with him upon discharge.   Benson Setting EMT-P Community Paramedic  [phone]

## 2022-10-13 NOTE — Telephone Encounter (Signed)
I spoke to Chana Bode, Administrator- [employer] and she said they are waiting for an award letter for the patient because they need to confirm exactly how much he makes before they are able to accept him   I spoke to Enid Skeens, Case worker [employer] to inquire if she has a copy of the award letter. She said she does not have one because he received that letter after he left [employer]. If he is not able to find the letter, he will need to call [employer] to obtain a copy.  [employer] will need to speak with him directly.

## 2022-10-13 NOTE — Telephone Encounter (Signed)
Spoke with [employer] representative Marlana Latus who advised that she is currently in the process of getting him housed. Pt

## 2022-10-14 ENCOUNTER — Inpatient Hospital Stay (HOSPITAL_COMMUNITY): Payer: MEDICAID

## 2022-10-14 ENCOUNTER — Inpatient Hospital Stay (HOSPITAL_COMMUNITY)
Admit: 2022-10-14 | Discharge: 2022-10-14 | Disposition: A | Discharge status: Home / Self Care | Payer: MEDICAID | Attending: Pulmonary Disease | Admitting: Pulmonary Disease

## 2022-10-14 ENCOUNTER — Other Ambulatory Visit: Payer: Self-pay

## 2022-10-14 DIAGNOSIS — J9601 Acute respiratory failure with hypoxia: Secondary | ICD-10-CM | POA: Diagnosis not present

## 2022-10-14 DIAGNOSIS — R4182 Altered mental status, unspecified: Secondary | ICD-10-CM | POA: Diagnosis not present

## 2022-10-14 DIAGNOSIS — R569 Unspecified convulsions: Secondary | ICD-10-CM

## 2022-10-14 LAB — BLOOD GAS, ARTERIAL
Acid-Base Excess: 10.2 mmol/L — ABNORMAL HIGH (ref 0.0–2.0)
Acid-Base Excess: 12.3 mmol/L — ABNORMAL HIGH (ref 0.0–2.0)
Bicarbonate: 40.9 mmol/L — ABNORMAL HIGH (ref 20.0–28.0)
Bicarbonate: 39.8 mmol/L — ABNORMAL HIGH (ref 20.0–28.0)
Drawn by: 23281
FIO2: 100 %
FIO2: 100 %
MECHVT: 540 mL
MECHVT: 540 mL
O2 Saturation: 98.2 %
O2 Saturation: 99.1 %
PEEP: 5 cmH2O
PEEP: 5 cmH2O
Patient temperature: 37.6
Patient temperature: 37.2
RATE: 20 {breaths}/min
RATE: 28 {breaths}/min
pCO2 arterial: 91 mm[Hg] (ref 32–48)
pCO2 arterial: 61 mm[Hg] — ABNORMAL HIGH (ref 32–48)
pH, Arterial: 7.26 — ABNORMAL LOW (ref 7.35–7.45)
pH, Arterial: 7.43 (ref 7.35–7.45)
pO2, Arterial: 96 mm[Hg] (ref 83–108)
pO2, Arterial: 153 mm[Hg] — ABNORMAL HIGH (ref 83–108)

## 2022-10-14 LAB — CBC
HCT: 52.7 % — ABNORMAL HIGH (ref 39.0–52.0)
HCT: 53.4 % — ABNORMAL HIGH (ref 39.0–52.0)
Hemoglobin: 14.8 g/dL (ref 13.0–17.0)
Hemoglobin: 15.8 g/dL (ref 13.0–17.0)
MCH: 26.7 pg (ref 26.0–34.0)
MCH: 26.8 pg (ref 26.0–34.0)
MCHC: 28.1 g/dL — ABNORMAL LOW (ref 30.0–36.0)
MCHC: 29.6 g/dL — ABNORMAL LOW (ref 30.0–36.0)
MCV: 95.1 fL (ref 80.0–100.0)
MCV: 90.5 fL (ref 80.0–100.0)
Platelets: 264 10*3/uL (ref 150–400)
Platelets: 335 10*3/uL (ref 150–400)
RBC: 5.54 MIL/uL (ref 4.22–5.81)
RBC: 5.9 MIL/uL — ABNORMAL HIGH (ref 4.22–5.81)
RDW: 17.4 % — ABNORMAL HIGH (ref 11.5–15.5)
RDW: 17.2 % — ABNORMAL HIGH (ref 11.5–15.5)
WBC: 16.4 10*3/uL — ABNORMAL HIGH (ref 4.0–10.5)
WBC: 15.9 10*3/uL — ABNORMAL HIGH (ref 4.0–10.5)
nRBC: 0.5 % — ABNORMAL HIGH (ref 0.0–0.2)
nRBC: 0.3 % — ABNORMAL HIGH (ref 0.0–0.2)

## 2022-10-14 LAB — BASIC METABOLIC PANEL
Anion gap: 13 (ref 5–15)
BUN: 17 mg/dL (ref 6–20)
CO2: 30 mmol/L (ref 22–32)
Calcium: 7.3 mg/dL — ABNORMAL LOW (ref 8.9–10.3)
Chloride: 95 mmol/L — ABNORMAL LOW (ref 98–111)
Creatinine, Ser: 1.4 mg/dL — ABNORMAL HIGH (ref 0.61–1.24)
GFR, Estimated: >60 mL/min (ref >60)
Glucose, Bld: 324 mg/dL — ABNORMAL HIGH (ref 70–99)
Potassium: 4.5 mmol/L (ref 3.5–5.1)
Sodium: 138 mmol/L (ref 135–145)

## 2022-10-14 LAB — COMPREHENSIVE METABOLIC PANEL
ALT: 29 U/L (ref 0–44)
AST: 28 U/L (ref 15–41)
Albumin: 3 g/dL — ABNORMAL LOW (ref 3.5–5.0)
Alkaline Phosphatase: 85 U/L (ref 38–126)
Anion gap: 15 (ref 5–15)
BUN: 21 mg/dL — ABNORMAL HIGH (ref 6–20)
CO2: 32 mmol/L (ref 22–32)
Calcium: 7.5 mg/dL — ABNORMAL LOW (ref 8.9–10.3)
Chloride: 92 mmol/L — ABNORMAL LOW (ref 98–111)
Creatinine, Ser: 1.47 mg/dL — ABNORMAL HIGH (ref 0.61–1.24)
GFR, Estimated: 58 mL/min — ABNORMAL LOW (ref >60)
Glucose, Bld: 308 mg/dL — ABNORMAL HIGH (ref 70–99)
Potassium: 4.4 mmol/L (ref 3.5–5.1)
Sodium: 139 mmol/L (ref 135–145)
Total Bilirubin: 1.3 mg/dL — ABNORMAL HIGH (ref 0.3–1.2)
Total Protein: 6.4 g/dL — ABNORMAL LOW (ref 6.5–8.1)

## 2022-10-14 LAB — COOXEMETRY PANEL
Carboxyhemoglobin: 2.6 % — ABNORMAL HIGH (ref 0.5–1.5)
Methemoglobin: <0.7 % (ref 0.0–1.5)
O2 Saturation: 63.6 %
Total hemoglobin: 15.5 g/dL (ref 12.0–16.0)

## 2022-10-14 LAB — HIV ANTIBODY (ROUTINE TESTING W REFLEX): HIV Screen 4th Generation wRfx: NONREACTIVE

## 2022-10-14 LAB — GLUCOSE, CAPILLARY
Glucose-Capillary: 222 mg/dL — ABNORMAL HIGH (ref 70–99)
Glucose-Capillary: 223 mg/dL — ABNORMAL HIGH (ref 70–99)
Glucose-Capillary: 233 mg/dL — ABNORMAL HIGH (ref 70–99)
Glucose-Capillary: 255 mg/dL — ABNORMAL HIGH (ref 70–99)
Glucose-Capillary: 288 mg/dL — ABNORMAL HIGH (ref 70–99)
Glucose-Capillary: 306 mg/dL — ABNORMAL HIGH (ref 70–99)

## 2022-10-14 LAB — TROPONIN I (HIGH SENSITIVITY)
Troponin I (High Sensitivity): 224 ng/L (ref <18)
Troponin I (High Sensitivity): 755 ng/L (ref <18)
Troponin I (High Sensitivity): 908 ng/L (ref <18)
Troponin I (High Sensitivity): 774 ng/L (ref <18)

## 2022-10-14 LAB — MAGNESIUM
Magnesium: 2.8 mg/dL — ABNORMAL HIGH (ref 1.7–2.4)
Magnesium: 2.2 mg/dL (ref 1.7–2.4)

## 2022-10-14 LAB — PHOSPHORUS: Phosphorus: 6.6 mg/dL — ABNORMAL HIGH (ref 2.5–4.6)

## 2022-10-14 LAB — D-DIMER, QUANTITATIVE: D-Dimer, Quant: 1.83 ug{FEU}/mL — ABNORMAL HIGH (ref 0.00–0.50)

## 2022-10-14 LAB — LACTIC ACID, PLASMA
Lactic Acid, Venous: 1.8 mmol/L (ref 0.5–1.9)
Lactic Acid, Venous: 1.9 mmol/L (ref 0.5–1.9)

## 2022-10-14 LAB — HEMOGLOBIN A1C
Hgb A1c MFr Bld: 9.7 % — ABNORMAL HIGH (ref 4.8–5.6)
Mean Plasma Glucose: 231.69 mg/dL

## 2022-10-14 LAB — MRSA NEXT GEN BY PCR, NASAL: MRSA by PCR Next Gen: NOT DETECTED

## 2022-10-14 MED ORDER — POLYVINYL ALCOHOL 1.4 % OP SOLN: 2.0000 [drp] | OPHTHALMIC | Status: DC | PRN

## 2022-10-14 MED ORDER — ONDANSETRON HCL 4 MG/2ML IJ SOLN: 4.0000 mg | Freq: Four times a day (QID) | INTRAMUSCULAR | Status: DC | PRN

## 2022-10-14 MED ORDER — REVEFENACIN 175 MCG/3ML IN SOLN
175.0000 ug | Freq: Every day | RESPIRATORY_TRACT | Status: DC
  Administered 2022-10-14 – 2022-10-21 (×8): 175 ug via RESPIRATORY_TRACT
  Filled 2022-10-14 (×9): qty 3

## 2022-10-14 MED ORDER — ORAL CARE MOUTH RINSE
15.0000 mL | OROMUCOSAL | Status: DC
  Administered 2022-10-14 – 2022-10-16 (×11): 15 mL via OROMUCOSAL

## 2022-10-14 MED ORDER — SODIUM CHLORIDE 0.9 % IV SOLN
250.0000 mL | INTRAVENOUS | Status: DC
  Administered 2022-10-14: 250 mL via INTRAVENOUS

## 2022-10-14 MED ORDER — IPRATROPIUM-ALBUTEROL 0.5-2.5 (3) MG/3ML IN SOLN
3.0000 mL | RESPIRATORY_TRACT | Status: DC | PRN
  Administered 2022-10-14: 3 mL via RESPIRATORY_TRACT
  Filled 2022-10-14: qty 3

## 2022-10-14 MED ORDER — ONDANSETRON HCL 4 MG PO TABS: 4.0000 mg | ORAL_TABLET | Freq: Four times a day (QID) | ORAL | Status: DC | PRN

## 2022-10-14 MED ORDER — AMIODARONE LOAD VIA INFUSION
150.0000 mg | Freq: Once | INTRAVENOUS | Status: AC
  Administered 2022-10-14: 150 mg via INTRAVENOUS
  Filled 2022-10-14: qty 83.34

## 2022-10-14 MED ORDER — HEPARIN (PORCINE) 25000 UT/250ML-% IV SOLN
2800.0000 [IU]/h | INTRAVENOUS | Status: DC
  Administered 2022-10-14 – 2022-10-15 (×2): 1600 [IU]/h via INTRAVENOUS
  Administered 2022-10-15: 2150 [IU]/h via INTRAVENOUS
  Administered 2022-10-16 (×2): 2400 [IU]/h via INTRAVENOUS
  Administered 2022-10-17 – 2022-10-21 (×12): 2800 [IU]/h via INTRAVENOUS
  Filled 2022-10-14 (×17): qty 250

## 2022-10-14 MED ORDER — METOPROLOL TARTRATE 5 MG/5ML IV SOLN
5.0000 mg | Freq: Four times a day (QID) | INTRAVENOUS | Status: DC | PRN
  Administered 2022-10-14 – 2022-10-20 (×8): 5 mg via INTRAVENOUS
  Filled 2022-10-14 (×9): qty 5

## 2022-10-14 MED ORDER — INSULIN ASPART 100 UNIT/ML IJ SOLN: 3.0000 [IU] | INTRAMUSCULAR | Status: DC

## 2022-10-14 MED ORDER — AMIODARONE HCL IN DEXTROSE 360-4.14 MG/200ML-% IV SOLN
30.0000 mg/h | INTRAVENOUS | Status: AC
  Administered 2022-10-14 – 2022-10-18 (×8): 30 mg/h via INTRAVENOUS
  Administered 2022-10-19 (×2): 60 mg/h via INTRAVENOUS
  Administered 2022-10-19: 30 mg/h via INTRAVENOUS
  Administered 2022-10-19: 60 mg/h via INTRAVENOUS
  Administered 2022-10-20: 30 mg/h via INTRAVENOUS
  Administered 2022-10-20: 60 mg/h via INTRAVENOUS
  Filled 2022-10-14 (×15): qty 200

## 2022-10-14 MED ORDER — HYDROMORPHONE HCL 1 MG/ML IJ SOLN
1.0000 mg | INTRAMUSCULAR | Status: AC | PRN
  Administered 2022-10-14 – 2022-10-19 (×3): 1 mg via INTRAVENOUS
  Filled 2022-10-14 (×3): qty 1

## 2022-10-14 MED ORDER — ARFORMOTEROL TARTRATE 15 MCG/2ML IN NEBU
15.0000 ug | INHALATION_SOLUTION | Freq: Two times a day (BID) | RESPIRATORY_TRACT | Status: DC
  Administered 2022-10-14 – 2022-10-21 (×15): 15 ug via RESPIRATORY_TRACT
  Filled 2022-10-14 (×15): qty 2

## 2022-10-14 MED ORDER — CHLORHEXIDINE GLUCONATE CLOTH 2 % EX PADS
6.0000 | MEDICATED_PAD | Freq: Every day | CUTANEOUS | Status: DC
  Administered 2022-10-14 – 2022-10-21 (×8): 6 via TOPICAL

## 2022-10-14 MED ORDER — POLYETHYLENE GLYCOL 3350 17 G PO PACK
17.0000 g | PACK | Freq: Every day | ORAL | Status: DC
  Administered 2022-10-14 – 2022-10-19 (×6): 17 g
  Filled 2022-10-14 (×8): qty 1

## 2022-10-14 MED ORDER — HEPARIN BOLUS VIA INFUSION
3000.0000 [IU] | Freq: Once | INTRAVENOUS | Status: AC
  Administered 2022-10-14: 3000 [IU] via INTRAVENOUS
  Filled 2022-10-14: qty 3000

## 2022-10-14 MED ORDER — AMIODARONE IV BOLUS ONLY 150 MG/100ML: 150.0000 mg | Freq: Once | INTRAVENOUS | Status: DC

## 2022-10-14 MED ORDER — NYSTATIN 100000 UNIT/GM EX POWD
Freq: Two times a day (BID) | CUTANEOUS | Status: DC
  Administered 2022-10-18 – 2022-10-19 (×2): 1 via TOPICAL
  Filled 2022-10-14: qty 15

## 2022-10-14 MED ORDER — SODIUM CHLORIDE 0.9 % IV SOLN: INTRAVENOUS | Status: DC | PRN

## 2022-10-14 MED ORDER — SODIUM CHLORIDE 0.9 % IV SOLN
3.0000 g | Freq: Four times a day (QID) | INTRAVENOUS | Status: AC
  Administered 2022-10-14 – 2022-10-20 (×28): 3 g via INTRAVENOUS
  Filled 2022-10-14 (×28): qty 8

## 2022-10-14 MED ORDER — DOCUSATE SODIUM 50 MG/5ML PO LIQD
100.0000 mg | Freq: Two times a day (BID) | ORAL | Status: DC
  Administered 2022-10-14 – 2022-10-20 (×13): 100 mg
  Filled 2022-10-14 (×15): qty 10

## 2022-10-14 MED ORDER — AMIODARONE HCL IN DEXTROSE 360-4.14 MG/200ML-% IV SOLN
60.0000 mg/h | INTRAVENOUS | Status: AC
  Administered 2022-10-14 (×2): 60 mg/h via INTRAVENOUS
  Filled 2022-10-14 (×2): qty 200

## 2022-10-14 MED ORDER — METOPROLOL TARTRATE 5 MG/5ML IV SOLN
5.0000 mg | Freq: Once | INTRAVENOUS | Status: AC
  Administered 2022-10-14: 5 mg via INTRAVENOUS
  Filled 2022-10-14: qty 5

## 2022-10-14 MED ORDER — PHENYLEPHRINE HCL-NACL 20-0.9 MG/250ML-% IV SOLN
0.0000 ug/min | INTRAVENOUS | Status: DC
  Filled 2022-10-14: qty 250

## 2022-10-14 MED ORDER — ORAL CARE MOUTH RINSE: 15.0000 mL | OROMUCOSAL | Status: DC | PRN

## 2022-10-14 MED ORDER — INSULIN GLARGINE-YFGN 100 UNIT/ML ~~LOC~~ SOLN: 25.0000 [IU] | Freq: Every day | SUBCUTANEOUS | Status: DC

## 2022-10-14 MED ORDER — LEVALBUTEROL HCL 1.25 MG/0.5ML IN NEBU
1.2500 mg | INHALATION_SOLUTION | Freq: Four times a day (QID) | RESPIRATORY_TRACT | Status: DC | PRN
  Administered 2022-10-17: 1.25 mg via RESPIRATORY_TRACT
  Filled 2022-10-14 (×2): qty 0.5

## 2022-10-14 MED ORDER — ACETAMINOPHEN 650 MG RE SUPP: 650.0000 mg | Freq: Four times a day (QID) | RECTAL | Status: DC | PRN

## 2022-10-14 MED ORDER — HYDROMORPHONE HCL 1 MG/ML IJ SOLN
1.0000 mg | INTRAMUSCULAR | Status: DC | PRN
  Administered 2022-10-15 (×3): 1 mg via INTRAVENOUS
  Administered 2022-10-16 – 2022-10-17 (×3): 2 mg via INTRAVENOUS
  Administered 2022-10-17: 1 mg via INTRAVENOUS
  Administered 2022-10-17 – 2022-10-19 (×3): 2 mg via INTRAVENOUS
  Administered 2022-10-19 – 2022-10-20 (×10): 1 mg via INTRAVENOUS
  Administered 2022-10-20: 2 mg via INTRAVENOUS
  Administered 2022-10-21: 1 mg via INTRAVENOUS
  Administered 2022-10-21 (×2): 2 mg via INTRAVENOUS
  Filled 2022-10-14: qty 2
  Filled 2022-10-14: qty 1
  Filled 2022-10-14: qty 2
  Filled 2022-10-14 (×2): qty 1
  Filled 2022-10-14: qty 2
  Filled 2022-10-14: qty 1
  Filled 2022-10-14: qty 2
  Filled 2022-10-14: qty 1
  Filled 2022-10-14: qty 2
  Filled 2022-10-14 (×2): qty 1
  Filled 2022-10-14: qty 2
  Filled 2022-10-14 (×2): qty 1
  Filled 2022-10-14 (×2): qty 2
  Filled 2022-10-14: qty 1
  Filled 2022-10-14 (×2): qty 2
  Filled 2022-10-14 (×2): qty 1
  Filled 2022-10-14: qty 2
  Filled 2022-10-14 (×2): qty 1

## 2022-10-14 MED ORDER — METHYLPREDNISOLONE SODIUM SUCC 125 MG IJ SOLR
125.0000 mg | Freq: Once | INTRAMUSCULAR | Status: AC
  Administered 2022-10-14: 125 mg via INTRAVENOUS
  Filled 2022-10-14: qty 2

## 2022-10-14 MED ORDER — DOXYCYCLINE HYCLATE 100 MG PO TABS
100.0000 mg | ORAL_TABLET | Freq: Two times a day (BID) | ORAL | Status: AC
  Administered 2022-10-14 – 2022-10-20 (×13): 100 mg
  Filled 2022-10-14 (×13): qty 1

## 2022-10-14 MED ORDER — INSULIN ASPART 100 UNIT/ML IJ SOLN: 0.0000 [IU] | INTRAMUSCULAR | Status: DC

## 2022-10-14 MED ORDER — INSULIN ASPART 100 UNIT/ML IJ SOLN
0.0000 [IU] | INTRAMUSCULAR | Status: DC
  Administered 2022-10-14: 8 [IU] via SUBCUTANEOUS
  Administered 2022-10-14: 5 [IU] via SUBCUTANEOUS
  Administered 2022-10-14: 8 [IU] via SUBCUTANEOUS
  Administered 2022-10-14: 11 [IU] via SUBCUTANEOUS
  Administered 2022-10-15: 3 [IU] via SUBCUTANEOUS
  Administered 2022-10-15 (×3): 5 [IU] via SUBCUTANEOUS
  Administered 2022-10-15 – 2022-10-16 (×2): 3 [IU] via SUBCUTANEOUS
  Administered 2022-10-16: 5 [IU] via SUBCUTANEOUS
  Administered 2022-10-16: 3 [IU] via SUBCUTANEOUS
  Administered 2022-10-16: 5 [IU] via SUBCUTANEOUS
  Administered 2022-10-16 (×3): 3 [IU] via SUBCUTANEOUS
  Administered 2022-10-17: 5 [IU] via SUBCUTANEOUS
  Administered 2022-10-17: 3 [IU] via SUBCUTANEOUS
  Administered 2022-10-17 – 2022-10-18 (×7): 5 [IU] via SUBCUTANEOUS
  Administered 2022-10-18: 8 [IU] via SUBCUTANEOUS
  Administered 2022-10-18: 3 [IU] via SUBCUTANEOUS
  Administered 2022-10-19 (×3): 5 [IU] via SUBCUTANEOUS
  Administered 2022-10-19: 3 [IU] via SUBCUTANEOUS
  Administered 2022-10-19: 5 [IU] via SUBCUTANEOUS
  Administered 2022-10-19 – 2022-10-20 (×2): 3 [IU] via SUBCUTANEOUS
  Administered 2022-10-20: 5 [IU] via SUBCUTANEOUS
  Administered 2022-10-20: 3 [IU] via SUBCUTANEOUS
  Administered 2022-10-20 (×2): 5 [IU] via SUBCUTANEOUS
  Administered 2022-10-20 – 2022-10-21 (×5): 3 [IU] via SUBCUTANEOUS

## 2022-10-14 MED ORDER — BUDESONIDE 0.25 MG/2ML IN SUSP
0.2500 mg | Freq: Two times a day (BID) | RESPIRATORY_TRACT | Status: DC
  Administered 2022-10-14 – 2022-10-21 (×15): 0.25 mg via RESPIRATORY_TRACT
  Filled 2022-10-14 (×15): qty 2

## 2022-10-14 MED ORDER — PANTOPRAZOLE SODIUM 40 MG IV SOLR
40.0000 mg | INTRAVENOUS | Status: DC
  Administered 2022-10-14 – 2022-10-20 (×7): 40 mg via INTRAVENOUS
  Filled 2022-10-14 (×8): qty 10

## 2022-10-14 MED ORDER — ACETAMINOPHEN 325 MG PO TABS
650.0000 mg | ORAL_TABLET | Freq: Four times a day (QID) | ORAL | Status: DC | PRN
  Administered 2022-10-16 – 2022-10-18 (×3): 650 mg
  Filled 2022-10-14 (×3): qty 2

## 2022-10-14 MED ORDER — HALOPERIDOL LACTATE 5 MG/ML IJ SOLN: 2.5000 mg | Freq: Four times a day (QID) | INTRAMUSCULAR | Status: DC | PRN

## 2022-10-14 MED ORDER — MIDAZOLAM HCL 2 MG/2ML IJ SOLN
1.0000 mg | INTRAMUSCULAR | Status: DC | PRN
  Administered 2022-10-15 – 2022-10-16 (×7): 2 mg via INTRAVENOUS
  Administered 2022-10-17: 1 mg via INTRAVENOUS
  Administered 2022-10-17 – 2022-10-19 (×6): 2 mg via INTRAVENOUS
  Administered 2022-10-20 (×2): 1 mg via INTRAVENOUS
  Administered 2022-10-20 (×2): 2 mg via INTRAVENOUS
  Administered 2022-10-20 – 2022-10-21 (×2): 1 mg via INTRAVENOUS
  Filled 2022-10-14 (×21): qty 2

## 2022-10-14 MED ORDER — VASOPRESSIN 20 UNITS/100 ML INFUSION FOR SHOCK
0.0000 [IU]/min | INTRAVENOUS | Status: DC
  Administered 2022-10-14 (×2): 0.03 [IU]/min via INTRAVENOUS
  Filled 2022-10-14 (×2): qty 100

## 2022-10-14 MED ORDER — ENOXAPARIN SODIUM 80 MG/0.8ML IJ SOSY
80.0000 mg | PREFILLED_SYRINGE | Freq: Every day | INTRAMUSCULAR | Status: DC
  Administered 2022-10-14: 80 mg via SUBCUTANEOUS
  Filled 2022-10-14: qty 0.8

## 2022-10-14 MED ORDER — NOREPINEPHRINE 4 MG/250ML-% IV SOLN
2.0000 ug/min | INTRAVENOUS | Status: DC
  Administered 2022-10-14: 10 ug/min via INTRAVENOUS
  Filled 2022-10-14: qty 250

## 2022-10-14 MED ORDER — INSULIN GLARGINE-YFGN 100 UNIT/ML ~~LOC~~ SOLN
20.0000 [IU] | Freq: Two times a day (BID) | SUBCUTANEOUS | Status: DC
  Administered 2022-10-14 – 2022-10-20 (×13): 20 [IU] via SUBCUTANEOUS
  Filled 2022-10-14 (×14): qty 0.2

## 2022-10-14 MED FILL — Medication: Qty: 1 | Status: AC

## 2022-10-14 NOTE — Progress Notes (Signed)
Received called from [facility] that patient HR was in 50s. Went to bedside to assess patient. Patient unresponsive, pulseless. CODE BLUE initiated.

## 2022-10-14 NOTE — Progress Notes (Signed)
   10/14/22 0837  Vent Select  Invasive or Noninvasive Invasive  Adult Vent Y  Airway 7.5 mm  Placement Date/Time: 10/14/22 3329   Placed By: ED Physician  Airway Device: Endotracheal Tube  ETT Types: Oral  Size (mm): 7.5 mm  Cuffed: Cuffed  Insertion attempts: 1  Airway Equipment: Stylet  Placement Confirmation: ETCO2 Detector;Bilateral Breat...  Secured at (cm) 26 cm  Measured From Lips  Secured Location Center  Secured By Commercial Tube Holder  Tube Holder Repositioned Yes  Prone position No  Head position Left  Cuff Pressure (cm H2O) Green OR 18-26 CmH2O  Site Condition Drainage (Comment) (suctioned small, white, thin amts of secretions.)  Adult Ventilator Settings  Vent Type Servo i  Humidity HME  Vent Mode PRVC  Vt Set 540 mL  Set Rate 28 bmp  FiO2 (%) (S)  70 % (Weaned to 70% FI02.)  I Time 0.91 Sec(s)  PEEP 5 cmH20  Adult Ventilator Measurements  Peak Airway Pressure 35 L/min  Mean Airway Pressure 17 cmH20  Plateau Pressure 27 cmH20  Resp Rate Spontaneous 0 br/min  Resp Rate Total 28 br/min  Exhaled Vt 537 mL  Measured Ve 13.4 L  I:E Ratio Measured 1:1.4  Total PEEP 5 cmH20  SpO2 98 %  Adult Ventilator Alarms  Alarms On Y  Ve High Alarm 20 L/min  Ve Low Alarm 4 L/min  Resp Rate High Alarm 40 br/min  Resp Rate Low Alarm 16  PEEP Low Alarm 3 cmH2O  Press High Alarm 50 cmH2O  T Apnea 20 sec(s)  VAP Prevention  HOB> 30 Degrees Y  Daily Weaning Assessment  Daily Assessment of Readiness to Wean  (High FI02, new intubation this am.)  Breath Sounds  Bilateral Breath Sounds Diminished  R Upper  Breath Sounds Diminished  L Upper Breath Sounds Diminished  R Lower Breath Sounds Diminished  L Lower Breath Sounds Diminished  Vent Respiratory Assessment  Level of Consciousness Unresponsive  Respiratory Pattern Regular;Unlabored;Other (Comment) (Ventilator controlled.)  Suction Method  Respiratory Interventions Airway suction;Oral suction  Oral  Suctioning/Secretions  Suction Type Oral  Suction Device Yankauer  Secretion Amount Small  Secretion Color White  Secretion Consistency Thin  Suction Tolerance Tolerated well  Suctioning Adverse Effects None  Airway Suctioning/Secretions  Suction Type ETT  Suction Device  Catheter  Secretion Amount Small  Secretion Color White  Secretion Consistency Thin  Suction Tolerance Tolerated well  Suctioning Adverse Effects None

## 2022-10-14 NOTE — Progress Notes (Signed)
   10/14/22 0000  BiPAP/CPAP/SIPAP  $ Non-Invasive Home Ventilator  Initial  $ Face Mask Large  Yes  BiPAP/CPAP/SIPAP Pt Type Adult  BiPAP/CPAP/SIPAP DREAMSTATIOND  Reason BIPAP/CPAP not in use Non-compliant  Mask Type Full face mask  Mask Size Large  Respiratory Rate 16 breaths/min  IPAP 16 cmH20  EPAP 8 cmH2O  Flow Rate 3 lpm  Patient Home Equipment No  Auto Titrate No  CPAP/SIPAP surface wiped down Yes   Pt. allowed initial placement, unable to tolerate at this time, remains in room, replaced 3L n/c, severe OSA with snoring noted prior to initial placement.

## 2022-10-14 NOTE — Plan of Care (Signed)
  Problem: Education: Goal: Ability to demonstrate management of disease process will improve Outcome: Progressing   Problem: Cardiac: Goal: Ability to achieve and maintain adequate cardiopulmonary perfusion will improve Outcome: Progressing   Problem: Fluid Volume: Goal: Ability to maintain a balanced intake and output will improve Outcome: Progressing

## 2022-10-14 NOTE — Progress Notes (Signed)
Pharmacy Antibiotic Note  Raymond Hood is a 52 y.o. male admitted on Nov 03, 2022 with fall.  Pharmacy has been consulted to dose unasyn for aspiration pna.  Plan: Unasyn 3gm IV q6h Follow renal function and clinical course  Height: 5\' 8"  (172.7 cm) Weight: (!) 167.4 kg (369 lb 0.8 oz) IBW/kg (Calculated) : 68.4  Temp (24hrs), Avg:98 F (36.7 C), Min:97.5 F (36.4 C), Max:98.6 F (37 C)  Recent Labs  Lab 11/03/22 1640 10/14/22 0419  WBC 11.3* 16.4*  CREATININE 0.96 1.40*    Estimated Creatinine Clearance: 97.5 mL/min (A) (by C-G formula based on SCr of 1.4 mg/dL (H)).    No Known Allergies   Thank you for allowing pharmacy to be a part of this patient's care.  Arley Phenix RPh 10/14/2022, 5:17 AM

## 2022-10-14 NOTE — Progress Notes (Signed)
NAME:  Raymond Hood, MRN:  [id number], DOB:  1970/12/24, LOS: 1 ADMISSION DATE:  11/04/2022, CONSULTATION DATE:  10/14/2022 REFERRING MD:  Dr. Antionette Char - [facility], CHIEF COMPLAINT:  Cardiac arrest    History of Present Illness:  Raymond Hood is a 52 year old male with a past medical history significant for COPD, OSA/OHS, chronic HFpEF, type 2 diabetes, obesity, and schizoaffective disorder who presented to the ED at [facility] long 10/3 after suffering mechanical fall resulting in bilateral leg and lower back pain.  Post fall patient was able to bear weight on lower extremities and ambulate but reports lower back and leg pain.  On ED arrival patient was seen hypoxic, tachypneic, and slightly tachycardic but hemodynamically stable.  CT chest/abdomen/pelvis notable for diffuse body wall edema and small right pleural effusion.  Patient was admitted per hospitalist for decompensated HFpEF and hypoxic respiratory failure.  Overnight/early a.m. 10/4 patient's suffered in-hospital cardiac arrest.  Per report patient became hypoxic and bradycardic with transition to PEA cardiac arrest with approximate 15 to 20 minutes of downtime.  PCCM consulted for further assistance in management.  Pertinent  Medical History  COPD, OSA/OHS, chronic HFpEF, type 2 diabetes, obesity, and schizoaffective disorder   Significant Hospital Events: Including procedures, antibiotic start and stop dates in addition to other pertinent events   10/3 presented after mechanical fall with a chief complaint of back pain and bilateral leg pain admitted for decompensated HFpEF and hypoxic respiratory failure 10/4 and hospital PEA cardiac arrest, approximate 20-minute downtime  Interim History / Subjective:  Admitted to ICU post-arrest CVC/A-line placed by PCCM Afib with RVR requiring amiodarone, metop 5mg  IV x 1 Oral/tongue twitching, EEG obtained  Objective:  Blood pressure 131/83, pulse (!) 54, temperature 100.3 F (37.9 C), temperature  source Axillary, resp. rate (!) 28, height 5\' 8"  (1.727 m), weight (!) 167.4 kg, SpO2 93%.    Vent Mode: PRVC FiO2 (%):  [50 %-100 %] 60 % Set Rate:  [20 bmp-28 bmp] 28 bmp Vt Set:  [540 mL] 540 mL PEEP:  [5 cmH20] 5 cmH20 Plateau Pressure:  [26 cmH20-27 cmH20] 27 cmH20   Intake/Output Summary (Last 24 hours) at 10/14/2022 1603 Last data filed at 10/14/2022 1500 Gross per 24 hour  Intake 886.54 ml  Output 2950 ml  Net -2063.46 ml   Filed Weights   10/14/22 0330  Weight: (!) 167.4 kg   Physical Examination: General: Acutely ill-appearing middle-aged man in NAD. Intubated. HEENT: [place]/AT, anicteric sclera, pupils pinpoint, moist mucous membranes. ETT/OGT in place. Neuro:  Intubated, unresponsive off of sedation.  Does not respond to verbal, tactile or noxious stimuli. Does not withdraw to pain. Not following commands. No spontaneous movement of extremities noted. +Corneal, minimal cough/gag. CV: Irregularly irregular rhythm, rate 120s, no m/g/r. PULM: Breathing even and unlabored on vent (PEEP 5, FiO2 60%). Lung fields diminished bilaterally. GI: Obese, soft, mildly distended. Not apparently TTP, LUQ with ?guarding. Hypoactive bowel sounds. Extremities: Bilateral symmetric LE edema noted with erythema, superficial skin wounds.  Skin: Warm/dry, no rashes. Bilateral LE edema.  Resolved Hospital Problem list     Assessment & Plan:  In-hospital cardiac arrest Chronic HFpEF acutely decompensated on admission Per report patient became hypoxic/bradycardic preceding PEA cardiac arrest.  Approximate 20-minute downtime. Echo 07/2021 with EF 55-60%, no RWMAs, G1DD. - Admit to ICU for close monitoring - Targeted temperature management/normothermia protocol - Goal MAP > 65 - Monitor I&Os/daily weights, diuresis as indicated - Levophed titrated to goal MAP + vaso - Trend troponin, f/u  Co-ox - F/u Echo - Cardiac monitoring - F/u EEG  Afib with RVR New Afib with RVR noted 10/4AM  post-arrest. - Heparin gtt - Amiodarone gtt for rate control, also received metop 5mg  IV x 1 - Optimize electrolyes (K > 4, Mg > 2) - Cardiac monitoring  Acute hypoxemic respiratory failure History of COPD History of OSA/OHS Tobacco abuse Refusing NIV/CPAP throughout admission. Bradycardic into PEA, in-hospital cardiac arrest likely 2/2 hypoxia. Intubated peri-code. - Continue full vent support (4-8cc/kg IBW) - Wean FiO2 for O2 sat > 90% - Daily WUA/SBT once appropriate from a mental status standpoint - VAP bundle - Pulmonary hygiene - PAD protocol for sedation: Fentanyl (PRN) for goal RASS 0 to -1, limiting sedation as able post-arrest to assess neurologic function -  Unasyn for aspiration PNA coverage - F/u Resp Cx - Daily CXR  Type 2 diabetes A1c 7.13 June 2022 - Basal Semglee 20U BID - SSI - CBGs Q4H - Goal CBG 140-180  AKI likely ATN in the setting of hemodynamic instability post-arrest  Baseline Cr 0.9 up to 1.4. - Trend BMP - Replete electrolytes as indicated - Monitor I&Os - Avoid nephrotoxic agents as able - Ensure adequate renal perfusion  Right lower extremity cellulitis Chronic dermatitis with secondary bacterial cellulitis involving the right lower extremity present on admission - Continue empiric doxycyline  History of anxiety or depression Home medications include Cymbalta, Risperdal, and as needed hydroxyzine - Hold home medications at present in the setting of PAD protocol initiation - Resume as clinically appropriate  Best Practice (right click and "Reselect all SmartList Selections" daily)   Diet/type: NPO DVT prophylaxis: LMWH GI prophylaxis: PPI Lines: N/A Foley:  N/A Code Status:  full code Last date of multidisciplinary goals of care discussion: Pending  Critical care time:    The patient is critically ill with multiple organ system failure and requires high complexity decision making for assessment and support, frequent evaluation and  titration of therapies, advanced monitoring, review of radiographic studies and interpretation of complex data.   Critical Care Time devoted to patient care services, exclusive of separately billable procedures, described in this note is 38 minutes.  Tim Lair, PA-C [facility] 10/14/22 4:05 PM  Please see [email or web address] for pager details.  From 7A-7P if no response, please call [phone] After hours, please call ELink [phone]

## 2022-10-14 NOTE — Progress Notes (Signed)
eLink Physician-Brief Progress Note Patient Name: Raymond Hood DOB: 07-04-1970 MRN: [id number]   Date of Service  10/14/2022  HPI/Events of Note  51/M with history of COPD, OSA/OHS, presents following slip and fall at home. Evaluation revealed pt to be hypoxemic. He was started on diuretics, and planned for NIPPV.  On the floor, pt was rported to have worsening hypoxemia, then became bradycardic and went into PEA.  CPR performed for 20-25 minutes prior to ROSC. He received epinephrine, bicarb and was intubated during the course of the code. Following ROSC, he was started on levophed and amiodarone. He was then transferred to the ICU for further management.   BP 82/45, MAP 56; HR 150, RR 20, SpO2 99% Pt may have received paralytics during intubation. Intermittently triggering vent. Pupils reported to be constricted.   eICU Interventions  - Admitted to the ICU  - Will continue to monitor neurologic status.  - Consider TTM if without contraindication.  - Continue vent support     - Maintain on TV 6-45ml/kg PBW, target plateau pressures <30     - Titrate FiO2 and PEEP to target SpO2>90%     - Serial ABG, will make further adjustments as warranted - Will continue bronchodilators, start systemic steroids.  - Levophed being uptitrated, at max dose. Ground team on way to place central line.  - BMP resulted, no severe electrolyte abnormalities - Increased sliding scale insulin for hyperglycemia.         Keonna Raether M DELA CRUZ 10/14/2022, 4:36 AM

## 2022-10-14 NOTE — Procedures (Signed)
Arterial Catheter Insertion Procedure Note  Macarthur Lorusso  [id number]  04/23/1970  Date:10/14/22  Time:6:22 AM    Provider Performing: Briant Sites    Procedure: Insertion of Arterial Line (82956) with US guidance (21308)   Indication(s) Blood pressure monitoring and/or need for frequent ABGs  Consent Risks of the procedure as well as the alternatives and risks of each were explained to the patient and/or caregiver.  Consent for the procedure was obtained and is signed in the bedside chart  Anesthesia None   Time Out Verified patient identification, verified procedure, site/side was marked, verified correct patient position, special equipment/implants available, medications/allergies/relevant history reviewed, required imaging and test results available.   Sterile Technique Maximal sterile technique including full sterile barrier drape, hand hygiene, sterile gown, sterile gloves, mask, hair covering, sterile ultrasound probe cover (if used).   Procedure Description Area of catheter insertion was cleaned with chlorhexidine and draped in sterile fashion. With real-time ultrasound guidance an arterial catheter was placed into the right radial artery.  Appropriate arterial tracings confirmed on monitor.     Complications/Tolerance None; patient tolerated the procedure well.   EBL Minimal   Specimen(s) None

## 2022-10-14 NOTE — Progress Notes (Signed)
PHARMACY - ANTICOAGULATION CONSULT NOTE  Pharmacy Consult for heparin Indication: atrial fibrillation  No Known Allergies  Patient Measurements: Height: 5\' 8"  (172.7 cm) Weight: (!) 167.4 kg (369 lb 0.8 oz) IBW/kg (Calculated) : 68.4 Heparin Dosing Weight: 110 kg  Vital Signs: Temp: 99.5 F (37.5 C) (10/04 1603) Temp Source: Axillary (10/04 1603) BP: 131/83 (10/04 1200) Pulse Rate: 59 (10/04 1900)  Labs: Recent Labs    10/24/22 1640 10/14/22 0419 10/14/22 0728 10/14/22 0728 10/14/22 1111 10/14/22 1403 10/14/22 1635  HGB 15.8 14.8 15.8  --   --   --   --   HCT 53.6* 52.7* 53.4*  --   --   --   --   PLT 279 264 335  --   --   --   --   CREATININE 0.96 1.40* 1.47*  --   --   --   --   TROPONINIHS  --   --  224*   < > 755* 908* 774*   < > = values in this interval not displayed.    Estimated Creatinine Clearance: 92.9 mL/min (A) (by C-G formula based on SCr of 1.47 mg/dL (H)).   Medical History: Past Medical History:  Diagnosis Date   AKI (acute kidney injury) (HCC) 07/27/2021   Bipolar 1 disorder, depressed (HCC)    CAP (community acquired pneumonia) 07/14/2021   COPD (chronic obstructive pulmonary disease) (HCC)    Depression with anxiety    Diabetes mellitus type II, non insulin dependent (HCC) 11/2020   A1c 7.3   Fall at home, initial encounter 05/28/2020   Last Assessment & Plan:  Formatting of this note might be different from the original. Acute onset, occurred few days ago.  Reports having a mechanical fall, fell forward injuring his left side including arm, knee.  Denies any vision changes, head trauma or loss of consciousness.  Patient is being referred to the emergency room for lower GI bleed, can also get evaluated for his injuries.   Gastric ulcer    when on NSAIDs   GERD (gastroesophageal reflux disease)    Helicobacter pylori antibody positive 07/04/2018   Formatting of this note might be different from the original. Started triple therapy 06/30/18.    Needs HP stool antigen in 8 weeks,  Off PPI for 10 days.   HTN (hypertension)    OSA (obstructive sleep apnea)    not currently on CPAP   Rheumatoid arthritis (HCC)    Right kidney mass, 2.8 cm 11/2020   Tobacco abuse      Assessment: 52 year old male presented after fall. He was admitted with hypervolemia and acute hypoxic respiratory failure. He was supposed to be on CPAP but refused - 10/4 am, pt became bradycardic and subsequently went into cardiac arrest. He was intubated. ROSC achieved after ~ 20 minutes. He was found to be tachycardic and given amiodarone; also requiring vasopressors at that time. Patient with atrial fibrillation post-arrest started on amiodarone infusion. Pharmacy consulted to manage heparin.  CBC stable.  Goal of Therapy:  Heparin level 0.3-0.7 units/ml Monitor platelets by anticoagulation protocol: Yes   Plan:  -Discontinue Lovenox prophylaxis (last dose ~ 1100) -Give heparin 3000 unit bolus (reduced in setting of Lovenox administration earlier today) -Start heparin infusion at 1600 units/hr -Check 6 hour heparin level -Daily CBC while on heparin infusion -Monitor for signs/symptoms of bleeding  Pricilla Riffle, PharmD, BCPS Clinical Pharmacist 10/14/2022 7:34 PM

## 2022-10-14 NOTE — Inpatient Diabetes Management (Signed)
Inpatient Diabetes Program Recommendations  AACE/ADA: New Consensus Statement on Inpatient Glycemic Control (2015)  Target Ranges:  Prepandial:   less than 140 mg/dL      Peak postprandial:   less than 180 mg/dL (1-2 hours)      Critically ill patients:  140 - 180 mg/dL   Lab Results  Component Value Date   GLUCAP 306 (H) 10/14/2022   HGBA1C 9.7 (H) 10/14/2022    Review of Glycemic Control  Diabetes history: DM 2 Outpatient Diabetes medications: 70/30 45 units bid, Jardiance 10 mg Daily, Metformin 1000 mg bid Current orders for Inpatient glycemic control:  Lantus 20 units bid Novolog 0-15 units Q4 hours  Epinephrine administered during code  Note: Semglee 45 units administered at 2350 and another 20 units administered at 0902 am.   Inpatient Diabetes Program Recommendations:    Due to critical nature of pt and glucose trends after epinephrine administered, if glucose trends do not trend downward, please initiate IV insulin  Will follow glucose trends.  Thanks,  Christena Deem RN, MSN, BC-ADM Inpatient Diabetes Coordinator Team Pager [phone] (8a-5p)

## 2022-10-14 NOTE — Progress Notes (Signed)
EEG complete - results pending 

## 2022-10-14 NOTE — Consult Note (Signed)
NAME:  Raymond Hood, MRN:  [id number], DOB:  [birth year], LOS: 1 ADMISSION DATE:  Oct 30, 2022, CONSULTATION DATE:  10/14/2022 REFERRING MD:  Dr. Antionette Char - TRh, CHIEF COMPLAINT:  Cardiac arrest    History of Present Illness:  Raymond Hood is a 52 year old male with a past medical history significant for COPD, OSA/OHS, chronic HFpEF, type 2 diabetes, obesity, and schizoaffective disorder who presented to the ED at [facility] long 10/3 after suffering mechanical fall resulting in bilateral leg and lower back pain.  Post fall patient was able to bear weight on lower extremities and ambulate but reports lower back and leg pain.  On ED arrival patient was seen hypoxic, tachypneic, and slightly tachycardic but hemodynamically stable.  CT chest/abdomen/pelvis notable for diffuse body wall edema and small right pleural effusion.  Patient was admitted per hospitalist for decompensated HFpEF and hypoxic respiratory failure.  Overnight/early a.m. 10/4 patient's suffered in-hospital cardiac arrest.  Per report patient became hypoxic and bradycardic with transition to PEA cardiac arrest with approximate 15 to 20 minutes of downtime.  PCCM consulted for further assistance in management.  Pertinent  Medical History  COPD, OSA/OHS, chronic HFpEF, type 2 diabetes, obesity, and schizoaffective disorder   Significant Hospital Events: Including procedures, antibiotic start and stop dates in addition to other pertinent events   10/3 presented after mechanical fall with a chief complaint of back pain and bilateral leg pain admitted for decompensated HFpEF and hypoxic respiratory failure 10/4 and hospital PEA cardiac arrest, approximate 20-minute downtime  Interim History / Subjective:  As above  Objective   Blood pressure (!) 71/33, pulse (!) 135, temperature 98 F (36.7 C), temperature source Oral, resp. rate (!) 23, height 5\' 8"  (1.727 m), weight (!) 167.4 kg, SpO2 92%.    FiO2 (%):  [100 %] 100 %   Intake/Output  Summary (Last 24 hours) at 10/14/2022 0425 Last data filed at 10/14/2022 0100 Gross per 24 hour  Intake --  Output 1600 ml  Net -1600 ml   Filed Weights   10/14/22 0330  Weight: (!) 167.4 kg    Examination: General: chronically ill, disheveled male unresponsive (not on sedation) in bed on vent HENT: ncat, perrla, mmmp, tongue with fasciculations Lungs: rhonchi bilaterally Cardiovascular: rrr Abdomen: obese, distant bs Extremities: chronic woody induration and lymphedema to thigh Neuro: unresponsive, no movement in any extremity, tongue fasiculations GU: deferred.   Resolved Hospital Problem list     Assessment & Plan:  In-hospital cardiac arrest -Per report patient became hypoxic/bradycardic preceding PEA cardiac arrest.  Approximate 20-minute downtime Chronic HFpEF acutely decompensated on admission -Most recent echocardiogram 07/2021 with a EF of 55 to 60%, no WMA, diastolic parameters consistent with grade 1 diastolic dysfunction P: Goal of normothermia Continuous telemetry Repeat echocardiogram Strict intake and output  Daily weight to assess volume status Daily assessment for need to diurese Closely monitor renal function and electrolytes  Vent support as below Pressors for MAP goal > 65 Will place cvc and aline If neuro status not improving consider eeg in am.   Acute hypoxic respiratory failure -Intubated during cardiac arrest History of COPD Tobacco abuse History of OSA/OHS -Refusing CPAP during admission P: Continue ventilator support with lung protective strategies  Wean PEEP and FiO2 for sats greater than 90%. Head of bed elevated 30 degrees. Plateau pressures less than 30 cm H20.  Follow intermittent chest x-ray and ABG.   SAT/SBT as tolerated, mentation preclude extubation  Ensure adequate pulmonary hygiene  Follow cultures  VAP bundle in  place  PAD protocol Continue bronchodilators Education on importance of CPAP post extubation -will start  unasyn in light of worsening infiltrate on cxr post intubation and coarse bs -send resp cx.   Type 2 diabetes -A1c 7.13 June 2022 P: SSI and long-acting insulin CBG checks every 4 CBG goal 140-180  Right lower extremity cellulitis -Chronic dermatitis with secondary bacterial cellulitis involving the right lower extremity present on admission P: Continue empiric Doxy  History of anxiety or depression -Medications include Cymbalta, Risperdal, and as needed hydroxyzine P: Hold home medications in favor of PAD protocol  Aki:  -post arrest -follow I/o and indices -baseline Cr 0.9 up to 1.4  Best Practice (right click and "Reselect all SmartList Selections" daily)   Diet/type: NPO DVT prophylaxis: LMWH GI prophylaxis: PPI Lines: N/A Foley:  N/A Code Status:  full code Last date of multidisciplinary goals of care discussion: Pending  Labs   CBC: Recent Labs  Lab 11/03/2022 1640 10/14/22 0419  WBC 11.3* 16.4*  NEUTROABS 8.4*  --   HGB 15.8 14.8  HCT 53.6* 52.7*  MCV 91.0 95.1  PLT 279 264    Basic Metabolic Panel: Recent Labs  Lab 2022/11/03 1640  NA 139  K 4.4  CL 97*  CO2 32  GLUCOSE 284*  BUN 18  CREATININE 0.96  CALCIUM 7.8*   GFR: Estimated Creatinine Clearance: 142.2 mL/min (by C-G formula based on SCr of 0.96 mg/dL). Recent Labs  Lab 11/03/22 1640 10/14/22 0419  WBC 11.3* 16.4*    Liver Function Tests: Recent Labs  Lab November 03, 2022 1640  AST 20  ALT 22  ALKPHOS 81  BILITOT 0.5  PROT 6.3*  ALBUMIN 3.0*   No results for input(s): "LIPASE", "AMYLASE" in the last 168 hours. No results for input(s): "AMMONIA" in the last 168 hours.  ABG    Component Value Date/Time   PHART 7.284 (L) 07/14/2021 1557   PCO2ART 80.5 (HH) 07/14/2021 1557   PO2ART 65 (L) 07/14/2021 1557   HCO3 33.0 (H) 07/27/2021 0503   TCO2 45 (H) 07/15/2021 0147   O2SAT 78.5 07/27/2021 0503     Coagulation Profile: No results for input(s): "INR", "PROTIME" in the last 168  hours.  Cardiac Enzymes: No results for input(s): "CKTOTAL", "CKMB", "CKMBINDEX", "TROPONINI" in the last 168 hours.  HbA1C: Hemoglobin A1C  Date/Time Value Ref Range Status  02/03/2022 10:35 AM 8.4 (A) 4.0 - 5.6 % Final   HbA1c, POC (controlled diabetic range)  Date/Time Value Ref Range Status  06/23/2022 02:43 PM 7.3 (A) 0.0 - 7.0 % Final  06/01/2021 10:27 AM 8.3 (A) 0.0 - 7.0 % Final   Hgb A1c MFr Bld  Date/Time Value Ref Range Status  07/27/2021 06:25 AM 8.9 (H) 4.8 - 5.6 % Final    Comment:    (NOTE) Pre diabetes:          5.7%-6.4%  Diabetes:              >6.4%  Glycemic control for   <7.0% adults with diabetes     CBG: Recent Labs  Lab 11-03-22 2308 10/14/22 0339  GLUCAP 174* 233*    Review of Systems:   Unable to assess  Past Medical History:  He,  has a past medical history of AKI (acute kidney injury) (HCC) (07/27/2021), Bipolar 1 disorder, depressed (HCC), CAP (community acquired pneumonia) (07/14/2021), COPD (chronic obstructive pulmonary disease) (HCC), Depression with anxiety, Diabetes mellitus type II, non insulin dependent (HCC) (11/2020), Fall at home, initial encounter (05/28/2020),  Gastric ulcer, GERD (gastroesophageal reflux disease), Helicobacter pylori antibody positive (07/04/2018), HTN (hypertension), OSA (obstructive sleep apnea), Rheumatoid arthritis (HCC), Right kidney mass, 2.8 cm (11/2020), and Tobacco abuse.   Surgical History:   Past Surgical History:  Procedure Laterality Date   left index finger surgery Left      Social History:   reports that he has been smoking cigarettes. He has never used smokeless tobacco. He reports that he does not currently use alcohol. He reports that he does not use drugs.   Family History:  His family history includes Diabetes Mellitus II in his father; Schizophrenia in his mother.   Allergies No Known Allergies   Home Medications  Prior to Admission medications   Medication Sig Start Date End  Date Taking? Authorizing Provider  atorvastatin (LIPITOR) 10 MG tablet Take 1 tablet (10 mg total) by mouth once daily. 06/23/22   Storm Frisk, MD  Blood Glucose Monitoring Suppl (ACCU-CHEK GUIDE) w/Device KIT Use up to four times daily as directed. 01/27/22   Storm Frisk, MD  Continuous Glucose Receiver (DEXCOM G7 RECEIVER) DEVI Use to check blood sugar throughout the day. E11.65 09/16/22   Storm Frisk, MD  Continuous Glucose Sensor (DEXCOM G7 SENSOR) MISC Use to check blood sugar throughout the day. Change sensors once every 10 days. E11.65 09/16/22   Storm Frisk, MD  CREON 12000-38000 units CPEP capsule Take 1 capsule (12,000 Units total) by mouth 3 (three) times daily. 06/23/22   Storm Frisk, MD  diphenoxylate-atropine (LOMOTIL) 2.5-0.025 MG tablet Take 1 tablet by mouth 4 (four) times daily as needed for diarrhea or loose stools. 01/14/22   Storm Frisk, MD  DULoxetine (CYMBALTA) 60 MG capsule Take 1 capsule (60 mg total) by mouth daily. 06/23/22   Storm Frisk, MD  EASY COMFORT PEN NEEDLES 31G X 5 MM MISC USE AS DIRECTED WITH INSULIN PEN 06/22/22   Storm Frisk, MD  empagliflozin (JARDIANCE) 10 MG TABS tablet Take 1 tablet (10 mg total) by mouth daily before breakfast. 09/20/22   Storm Frisk, MD  famotidine (PEPCID) 20 MG tablet Take 1 tablet (20 mg total) by mouth 2 (two) times daily. 06/23/22   Storm Frisk, MD  Fingerstix Lancets MISC Use as directed up to 4 times daily. 02/01/22   Storm Frisk, MD  fluticasone (FLONASE) 50 MCG/ACT nasal spray Place 2 sprays into both nostrils daily. 01/27/22   Storm Frisk, MD  furosemide (LASIX) 40 MG tablet Take 1 tablet (40 mg total) by mouth daily. 06/23/22   Storm Frisk, MD  gabapentin (NEURONTIN) 300 MG capsule TAKE 2 CAPSULES IN THE MORNING AND 1 CAPSULE MIDDAY AND 2 CAPSULES EVERY EVENING. (2AM+1N+2PM) 08/16/22   Storm Frisk, MD  glucose blood test strip Use four times daily as directed.  02/01/22   Storm Frisk, MD  hydrOXYzine (ATARAX) 25 MG tablet TAKE 1 TABLET (25 MG TOTAL) BY MOUTH 3 (THREE) TIMES DAILY AS NEEDED. (AM+NOON+BEDTIME) 09/07/22   Storm Frisk, MD  insulin aspart protamine - aspart (NOVOLOG MIX 70/30 FLEXPEN) (70-30) 100 UNIT/ML FlexPen Inject 45 Units into the skin 2 (two) times daily with a meal. 10/05/22   Storm Frisk, MD  metFORMIN (GLUCOPHAGE) 500 MG tablet Take 2 tablets (1,000 mg total) by mouth 2 (two) times daily with a meal. 06/23/22   Storm Frisk, MD  Multiple Vitamins-Minerals (PRESERVISION AREDS 2) CAPS Take 1 capsule by mouth 2 (two) times daily  with food. Take 1 in the morning and 1 in the evening. Patient not taking: Reported on 09/26/2022 02/01/22   Storm Frisk, MD  nicotine (NICODERM CQ - DOSED IN MG/24 HOURS) 21 mg/24hr patch Place 1 patch (21 mg total) onto the skin daily. Patient not taking: Reported on 07/21/2022 03/08/22   Storm Frisk, MD  nicotine polacrilex (NICORETTE MINI) 4 MG lozenge Use 3 times a day to quit smoking Patient not taking: Reported on 07/21/2022 02/24/22   Storm Frisk, MD  olopatadine (PATANOL) 0.1 % ophthalmic solution Place 1 drop into the right eye 2 (two) times daily. Patient not taking: Reported on 07/21/2022 03/25/21   Storm Frisk, MD  polyethylene glycol powder [employer]) 17 GM/SCOOP powder Take 17 g by mouth daily as needed for moderate constipation or severe constipation. 12/06/21   Storm Frisk, MD  risperidone (RISPERDAL) 4 MG tablet Take 1 tablet (4 mg total) by mouth once daily. 06/23/22   Storm Frisk, MD  SYMBICORT 160-4.5 MCG/ACT inhaler Inhale 2 puffs into the lungs 2 (two) times daily. 10/11/22   Storm Frisk, MD  tamsulosin (FLOMAX) 0.4 MG CAPS capsule Take 1 capsule (0.4 mg total) by mouth daily. 06/23/22   Storm Frisk, MD  VENTOLIN HFA 108 (90 Base) MCG/ACT inhaler INHALE 2 PUFFS INTO THE LUNGS EVERY 6 (SIX) HOURS AS NEEDED FOR WHEEZING OR  SHORTNESS OF BREATH. 08/11/22   Storm Frisk, MD     Critical care time: excluding procedures.

## 2022-10-14 NOTE — Progress Notes (Signed)
Patient became bradycardic unresponsive and pulseless CODE BLUE initiated. Patient transferred to ICU. Report given to [place], Charity fundraiser.

## 2022-10-14 NOTE — Progress Notes (Addendum)
Cxr reviewed by Dr Vladimir Faster.  ETT advanced 1cm from 25cm lip to 26cm lip per MD.  RN aware.  Pt tolerated well without incident.

## 2022-10-14 NOTE — Progress Notes (Signed)
   10/14/22 1038  Adult Ventilator Settings  FiO2 (%) (S)  50 % (Weaned to 50% FIO2.)   Sp02: 97%.

## 2022-10-14 NOTE — Progress Notes (Addendum)
Pt refused CPAP patient educated Lauren CN aware.

## 2022-10-14 NOTE — Significant Event (Signed)
Responded to CODE BLUE to find high-quality CPR in progress.   Patient had been admitted last night with hypervolemia and acute hypoxic respiratory failure. He refused CPAP last night, became bradycardic, and went into cardiac arrest.   ACLS was performed for ~20 minutes, ETT was placed by ED physician without pharmacologic sedation, ROSC was achieved, and HR was 180-200. He was loaded with amiodarone.   Dr. Gaynell Face or PCCM was consulted, CXR and EKG ordered, and patient transferred to ICU.

## 2022-10-14 NOTE — Progress Notes (Signed)
Sputum sample obtained and delivered to lab for processing.

## 2022-10-14 NOTE — Procedures (Signed)
Central Venous Catheter Insertion Procedure Note  Olander Friedl  [id number]  [birth year]  Date:10/14/22  Time:6:15 AM   Provider Performing:Sidda Humm Gaynell Face   Procedure: Insertion of Non-tunneled Central Venous Catheter(36556) with US guidance (62130)   Indication(s) Medication administration  Consent Unable to obtain consent due to emergent nature of procedure.  Anesthesia See MAR  Timeout Verified patient identification, verified procedure, site/side was marked, verified correct patient position, special equipment/implants available, medications/allergies/relevant history reviewed, required imaging and test results available.  Sterile Technique Maximal sterile technique including full sterile barrier drape, hand hygiene, sterile gown, sterile gloves, mask, hair covering, sterile ultrasound probe cover (if used).  Procedure Description Area of catheter insertion was cleaned with chlorhexidine and draped in sterile fashion.  With real-time ultrasound guidance a central venous catheter was placed into the right internal jugular vein. Nonpulsatile blood flow and easy flushing noted in all ports.  The catheter was sutured in place and sterile dressing applied.  Complications/Tolerance None; patient tolerated the procedure well. Chest X-ray is ordered to verify placement for internal jugular or subclavian cannulation.   Chest x-ray is not ordered for femoral cannulation.  EBL Minimal  Specimen(s) None

## 2022-10-14 NOTE — ED Provider Notes (Signed)
I was called to the floor for CODE BLUE.  Upon arrival, patient was actively receiving ACLS and CPR.  Reported hypoxic, bradycardic arrest.  Per hospitalist, patient had refused CPAP/BiPAP.  Appeared to be in PEA.  Patient was intubated upon my arrival.  He had ACLS for approximately 15 to 20 minutes.  He did have ROSC and appeared to be in a tachycardic rhythm with rates in the 180s to 200s.  He was loaded with amiodarone.  Limited IV access and IO became dislodged.  Body habitus is significantly limiting.  Patient was handed off to the hospitalist and will be transferred to the ICU.   Physical Exam  BP (!) 149/81 (BP Location: Right Arm)   Pulse (!) 116   Temp 98 F (36.7 C) (Oral)   Resp 20   Ht 1.727 m (5\' 8" )   Wt (!) 167.4 kg   SpO2 92%   BMI 56.11 kg/m   Physical Exam  Procedures  .Critical Care  Performed by: Shon Baton, MD Authorized by: Shon Baton, MD   Critical care provider statement:    Critical care time (minutes):  31   Critical care was necessary to treat or prevent imminent or life-threatening deterioration of the following conditions:  Cardiac failure and respiratory failure   Critical care was time spent personally by me on the following activities:  Development of treatment plan with patient or surrogate, discussions with consultants, evaluation of patient's response to treatment, examination of patient, ordering and review of laboratory studies, ordering and review of radiographic studies, ordering and performing treatments and interventions, pulse oximetry, re-evaluation of patient's condition and review of old charts Procedure Name: Intubation Date/Time: 10/14/2022 4:08 AM  Performed by: Shon Baton, MDPre-anesthesia Checklist: Patient identified, Patient being monitored, Emergency Drugs available, Timeout performed and Suction available Oxygen Delivery Method: Ambu bag Ventilation: Two handed mask ventilation required Laryngoscope Size:  Mac and 4 Grade View: Grade IV Tube size: 7.5 mm Number of attempts: 1 Placement Confirmation: CO2 detector and Breath sounds checked- equal and bilateral Secured at: 26 cm Tube secured with: ETT holder Dental Injury: Teeth and Oropharynx as per pre-operative assessment  Difficulty Due To: Difficulty was anticipated Comments: Patient intubated during active CPR.  Good condensation in the tube, breath sounds noted.  End-tidal detected.    CPR  Date/Time: 10/14/2022 4:09 AM  Performed by: Shon Baton, MD Authorized by: Shon Baton, MD  CPR Procedure Details:      Amount of time prior to administration of ACLS/BLS (minutes):  5   ACLS/BLS initiated by EMS: No     CPR/ACLS performed in the ED: No     Duration of CPR (minutes):  15   Outcome: ROSC obtained    CPR performed via ACLS guidelines under my direct supervision.  See RN documentation for details including defibrillator use, medications, doses and timing.   ED Course / MDM   Clinical Course as of 10/14/22 0406  Thu Oct 13, 2022  1657 Stable 51 YOM with GLF TBP. Larey Seat at home.   [CC]    Clinical Course User Index [CC] Glyn Ade, MD   Medical Decision Making Amount and/or Complexity of Data Reviewed Labs: ordered. Radiology: ordered.  Risk Prescription drug management. Decision regarding hospitalization.   Cardiac arrest       Shon Baton, MD 10/14/22 [phone]

## 2022-10-14 NOTE — TOC Initial Note (Signed)
Transition of Care [facility]) - Initial/Assessment Note    Patient Details  Name: Raymond Hood MRN: [id number] Date of Birth: 1970/02/02  [facility] CM/SW Contact:    Otelia Santee, LCSW Phone Number: 10/14/2022, 11:11 AM  Clinical Narrative:                 Pt currently intubated. TOC will follow.   Expected Discharge Plan: Home/Self Care Barriers to Discharge: Continued Medical Work up   Patient Goals and CMS Choice Patient states their goals for this hospitalization and ongoing recovery are:: Unable to assess CMS Medicare.gov Compare Post Acute Care list provided to:: Patient Choice offered to / list presented to : NA [place] ownership interest in [facility].provided to::  (NA)    Expected Discharge Plan and Services In-house Referral: Clinical Social Work Discharge Planning Services: NA Post Acute Care Choice: NA Living arrangements for the past 2 months: Apartment                                      Prior Living Arrangements/Services Living arrangements for the past 2 months: Apartment Lives with:: Self Patient language and need for interpreter reviewed:: Yes Do you feel safe going back to the place where you live?: Yes      Need for Family Participation in Patient Care: No (Comment) Care giver support system in place?: No (comment) Current home services: DME (bipap, cane) Criminal Activity/Legal Involvement Pertinent to Current Situation/Hospitalization: No - Comment as needed  Activities of Daily Living   ADL Screening (condition at time of admission) Independently performs ADLs?: No Does the patient have a NEW difficulty with bathing/dressing/toileting/self-feeding that is expected to last >3 days?: Yes (Initiates electronic notice to provider for possible OT consult) Does the patient have a NEW difficulty with getting in/out of bed, walking, or climbing stairs that is expected to last >3 days?: Yes (Initiates electronic  notice to provider for possible PT consult) Does the patient have a NEW difficulty with communication that is expected to last >3 days?: Yes (Initiates electronic notice to provider for possible SLP consult) Is the patient deaf or have difficulty hearing?: No Does the patient have difficulty seeing, even when wearing glasses/contacts?: No Does the patient have difficulty concentrating, remembering, or making decisions?: No  Permission Sought/Granted                  Emotional Assessment   Attitude/Demeanor/Rapport: Unable to Assess Affect (typically observed): Unable to Assess Orientation: :  (Unable to assess) Alcohol / Substance Use: Not Applicable Psych Involvement: No (comment)  Admission diagnosis:  Anasarca [R60.1] Acute respiratory failure with hypoxia (HCC) [J96.01] Hypervolemia, unspecified hypervolemia type [E87.70] Acute on chronic congestive heart failure, unspecified heart failure type (HCC) [I50.9] Patient Active Problem List   Diagnosis Date Noted   Anasarca 10/26/2022   Cellulitis of right leg 10/23/2022   Acute respiratory failure with hypoxia (HCC) 07/14/2021   Anxiety and depression 07/14/2021   Essential hypertension 07/14/2021   Chronic diastolic heart failure (HCC) 01/14/2021   Type 2 diabetes mellitus with hyperglycemia, with long-term current use of insulin (HCC) 12/14/2020   GERD (gastroesophageal reflux disease) 10/15/2020   Tobacco abuse 10/15/2020   Renal mass, right 10/15/2020   COPD with chronic bronchitis (HCC) 10/15/2020   Morbid obesity with BMI of 50.0-59.9, adult (HCC) 10/15/2020   Sheltered homelessness 10/15/2020   MDD (major depressive disorder),  recurrent episode (HCC) 09/28/2020   Anxiety 09/28/2020   Schizo affective schizophrenia (HCC) 09/28/2020   Numbness and tingling in both hands 05/28/2020   Adrenal hyperplasia (HCC) 06/05/2019   Low back pain 08/29/2018   Multiple joint pain 08/29/2018   OSA/OHS 08/17/2018   Exocrine  pancreatic insufficiency 07/02/2018   Asthma without status asthmaticus 03/27/2018   Epidermal inclusion cyst 07/20/2012   Psoriasis, unspecified 06/28/2012   Erectile dysfunction 06/28/2012   ADHD 11/17/1981   PCP:  Storm Frisk, MD Pharmacy:    - [facility] [address] [place] [address] Phone: [phone] Fax: [phone]  [facility] [address], [address] [place] [address] Phone: [phone] Fax: [phone]  [facility] [address] [place] [address] Phone: [phone] Fax: [phone]  [facility] - [place], [place] - [address] [place] [address] Phone: [phone] Fax: [phone]     Social Determinants of Health (SDOH) Social History: SDOH Screenings   Food Insecurity: Food Insecurity Present (11/04/2022)  Housing: Low Risk  (10/25/2022)  Transportation Needs: No Transportation Needs (11/04/2022)  Utilities: Not At Risk (10/19/2022)  Depression (PHQ2-9): High Risk (06/23/2022)  Financial Resource Strain: High Risk (11/30/2020)   [facility] from [facility], [facility]  Physical Activity: Inactive (12/01/2020)   Received from [facility], [facility]  Social Connections: Unknown (05/23/2021)   Received from [facility]  Stress: Stress Concern Present (11/30/2020)   Received from [facility], [facility]  Tobacco Use: High Risk (10/28/2022)   SDOH Interventions:     Readmission Risk Interventions    10/14/2022   11:09 AM  Readmission Risk Prevention Plan  Transportation Screening Complete  PCP or Specialist Appt within 5-7 Days Complete  Home Care Screening Complete  Medication Review (RN CM) Complete

## 2022-10-14 NOTE — Progress Notes (Signed)
Initial Nutrition Assessment  DOCUMENTATION CODES:   Morbid obesity  INTERVENTION:  - Once medically appropriate to start tube feeds, would recommend: Pivot 1.5 at 50 ml/h (120 ml per day) *Would recommend starting at 42mL/hr and once able to advance, increase by 10mL Q4H Prosource TF20 60 ml TID Provides 2040 kcal, 172 gm protein, 900 ml free water daily  - FWF per CCM/MD.    NUTRITION DIAGNOSIS:   Inadequate oral intake related to inability to eat as evidenced by NPO status.  GOAL:   Patient will meet greater than or equal to 90% of their needs  MONITOR:   Vent status, Labs, Weight trends  REASON FOR ASSESSMENT:   Ventilator    ASSESSMENT:   52 y.o. male with PMH significant for COPD, OSA/OHS, chronic HFpEF, DM2, schizoaffective disorder, and BMI 56 who presented with bilateral leg and low back pain after a slip and fall at home.  10/3: Admit 10/4: suffered a cardiac arrest; intubated  Patient intubated and sedated at time of visit. No visitors at bedside.   OGT placed and xray verified in the stomach.  Initially on levo this AM but now off. Remains on Vasopressin.   Of note, weight this admission is the same as weight from 06/23/22 so suspect was carried over.  No plan for tube feeds as of yet. Recs as above. Will monitor for ability to start tube feeds.    Medications reviewed and include: Colace, Miralax, Insulin, Lasix Vasopressin @ 0.03 units/min  Labs reviewed:  Creatinine 1.47 Phosphorus 6.6 HA1C 9.7 Blood Glucose 174-306 since admit   NUTRITION - FOCUSED PHYSICAL EXAM:  No depletions  Diet Order:   Diet Order     None       EDUCATION NEEDS:  Not appropriate for education at this time  Skin:  Skin Assessment: Reviewed RN Assessment  Last BM:  10/3  Height:  Ht Readings from Last 1 Encounters:  10/14/22 5\' 8"  (1.727 m)   Weight:  Wt Readings from Last 1 Encounters:  10/14/22 (!) 167.4 kg   Ideal Body Weight:  70 kg  BMI:   Body mass index is 56.11 kg/m.  Estimated Nutritional Needs:  Kcal:  1850-2350 kcals Protein:  140-175 grams Fluid:  >/= 1.9L    Shelle Iron RD, LDN For contact information, refer to [facility].

## 2022-10-14 NOTE — Procedures (Addendum)
Patient Name: Raymond Hood  MRN: [id number]  Epilepsy Attending: Charlsie Quest  Referring Physician/Provider: Tim Lair, PA-C  Date: 10/14/2022 Duration: 25.10 mins  Patient history: 52yo M s/p cardiac arrest getting eeg to evaluate for seizure.  Level of alertness: comatose  AEDs during EEG study: None  Technical aspects: This EEG study was done with scalp electrodes positioned according to the 10-20 International system of electrode placement. Electrical activity was reviewed with band pass filter of 1-70Hz , sensitivity of 7 uV/mm, display speed of 74mm/sec with a 60Hz  notched filter applied as appropriate. EEG data were recorded continuously and digitally stored.  Video monitoring was available and reviewed as appropriate.  Description: EEG showed continuous generalized background suppression, not reactive to stimulation. Hyperventilation and photic stimulation were not performed.     ABNORMALITY - Background suppression, generalized  IMPRESSION: This study is suggestive of profound diffuse encephalopathy. No seizures or epileptiform discharges were seen throughout the recording.  Shawnice Tilmon Annabelle Harman

## 2022-10-15 ENCOUNTER — Inpatient Hospital Stay (HOSPITAL_COMMUNITY): Payer: MEDICAID

## 2022-10-15 DIAGNOSIS — I509 Heart failure, unspecified: Secondary | ICD-10-CM

## 2022-10-15 DIAGNOSIS — R601 Generalized edema: Secondary | ICD-10-CM | POA: Diagnosis not present

## 2022-10-15 LAB — PHOSPHORUS: Phosphorus: 4.4 mg/dL (ref 2.5–4.6)

## 2022-10-15 LAB — MAGNESIUM: Magnesium: 2.2 mg/dL (ref 1.7–2.4)

## 2022-10-15 LAB — HEPARIN LEVEL (UNFRACTIONATED)
Heparin Unfractionated: 0.44 [IU]/mL (ref 0.30–0.70)
Heparin Unfractionated: 0.23 [IU]/mL — ABNORMAL LOW (ref 0.30–0.70)
Heparin Unfractionated: 0.25 [IU]/mL — ABNORMAL LOW (ref 0.30–0.70)

## 2022-10-15 LAB — BASIC METABOLIC PANEL
Anion gap: 13 (ref 5–15)
Anion gap: 14 (ref 5–15)
BUN: 29 mg/dL — ABNORMAL HIGH (ref 6–20)
BUN: 33 mg/dL — ABNORMAL HIGH (ref 6–20)
CO2: 34 mmol/L — ABNORMAL HIGH (ref 22–32)
CO2: 32 mmol/L (ref 22–32)
Calcium: 7.6 mg/dL — ABNORMAL LOW (ref 8.9–10.3)
Calcium: 7.6 mg/dL — ABNORMAL LOW (ref 8.9–10.3)
Chloride: 94 mmol/L — ABNORMAL LOW (ref 98–111)
Chloride: 96 mmol/L — ABNORMAL LOW (ref 98–111)
Creatinine, Ser: 1.49 mg/dL — ABNORMAL HIGH (ref 0.61–1.24)
Creatinine, Ser: 1.6 mg/dL — ABNORMAL HIGH (ref 0.61–1.24)
GFR, Estimated: 57 mL/min — ABNORMAL LOW (ref >60)
GFR, Estimated: 52 mL/min — ABNORMAL LOW (ref >60)
Glucose, Bld: 184 mg/dL — ABNORMAL HIGH (ref 70–99)
Glucose, Bld: 171 mg/dL — ABNORMAL HIGH (ref 70–99)
Potassium: 3.2 mmol/L — ABNORMAL LOW (ref 3.5–5.1)
Potassium: 3.4 mmol/L — ABNORMAL LOW (ref 3.5–5.1)
Sodium: 141 mmol/L (ref 135–145)
Sodium: 142 mmol/L (ref 135–145)

## 2022-10-15 LAB — CBC
HCT: 48.9 % (ref 39.0–52.0)
Hemoglobin: 14.9 g/dL (ref 13.0–17.0)
MCH: 26.5 pg (ref 26.0–34.0)
MCHC: 30.5 g/dL (ref 30.0–36.0)
MCV: 86.9 fL (ref 80.0–100.0)
Platelets: 236 10*3/uL (ref 150–400)
RBC: 5.63 MIL/uL (ref 4.22–5.81)
RDW: 16.7 % — ABNORMAL HIGH (ref 11.5–15.5)
WBC: 12.6 10*3/uL — ABNORMAL HIGH (ref 4.0–10.5)
nRBC: 0 % (ref 0.0–0.2)

## 2022-10-15 LAB — ECHOCARDIOGRAM COMPLETE
Area-P 1/2: 6.71 cm2
Calc EF: 35.9 %
Height: 68 in
S' Lateral: 4.5 cm
Single Plane A2C EF: 30.2 %
Single Plane A4C EF: 40.2 %
Weight: 7523.86 [oz_av]

## 2022-10-15 LAB — GLUCOSE, CAPILLARY
Glucose-Capillary: 179 mg/dL — ABNORMAL HIGH (ref 70–99)
Glucose-Capillary: 187 mg/dL — ABNORMAL HIGH (ref 70–99)
Glucose-Capillary: 195 mg/dL — ABNORMAL HIGH (ref 70–99)
Glucose-Capillary: 195 mg/dL — ABNORMAL HIGH (ref 70–99)
Glucose-Capillary: 205 mg/dL — ABNORMAL HIGH (ref 70–99)
Glucose-Capillary: 211 mg/dL — ABNORMAL HIGH (ref 70–99)

## 2022-10-15 MED ORDER — PERFLUTREN LIPID MICROSPHERE
1.0000 mL | INTRAVENOUS | Status: AC | PRN
  Administered 2022-10-15: 4 mL via INTRAVENOUS

## 2022-10-15 MED ORDER — FUROSEMIDE 10 MG/ML IJ SOLN
80.0000 mg | Freq: Once | INTRAMUSCULAR | Status: AC
  Administered 2022-10-15: 80 mg via INTRAVENOUS
  Filled 2022-10-15: qty 8

## 2022-10-15 MED ORDER — HEPARIN BOLUS VIA INFUSION
4000.0000 [IU] | Freq: Once | INTRAVENOUS | Status: AC
  Administered 2022-10-15: 4000 [IU] via INTRAVENOUS
  Filled 2022-10-15: qty 4000

## 2022-10-15 MED ORDER — POTASSIUM CHLORIDE 20 MEQ PO PACK
20.0000 meq | PACK | ORAL | Status: AC
  Administered 2022-10-15 (×2): 20 meq
  Filled 2022-10-15 (×2): qty 1

## 2022-10-15 MED ORDER — POTASSIUM CHLORIDE 20 MEQ PO PACK
40.0000 meq | PACK | Freq: Once | ORAL | Status: AC
  Administered 2022-10-15: 40 meq via ORAL
  Filled 2022-10-15: qty 2

## 2022-10-15 MED ORDER — POTASSIUM CHLORIDE 10 MEQ/50ML IV SOLN
10.0000 meq | INTRAVENOUS | Status: AC
  Administered 2022-10-15 (×4): 10 meq via INTRAVENOUS
  Filled 2022-10-15 (×4): qty 50

## 2022-10-15 MED ORDER — METOPROLOL TARTRATE 25 MG PO TABS
25.0000 mg | ORAL_TABLET | Freq: Four times a day (QID) | ORAL | Status: DC
  Administered 2022-10-15: 25 mg via ORAL
  Filled 2022-10-15: qty 1

## 2022-10-15 MED ORDER — PIVOT 1.5 CAL PO LIQD
1000.0000 mL | ORAL | Status: DC
  Administered 2022-10-15 – 2022-10-21 (×7): 1000 mL
  Filled 2022-10-15 (×8): qty 1000

## 2022-10-15 MED ORDER — HEPARIN BOLUS VIA INFUSION
1500.0000 [IU] | Freq: Once | INTRAVENOUS | Status: AC
  Administered 2022-10-15: 1500 [IU] via INTRAVENOUS
  Filled 2022-10-15: qty 1500

## 2022-10-15 MED ORDER — PROSOURCE TF20 ENFIT COMPATIBL EN LIQD
60.0000 mL | Freq: Three times a day (TID) | ENTERAL | Status: DC
  Administered 2022-10-15 – 2022-10-21 (×19): 60 mL
  Filled 2022-10-15 (×20): qty 60

## 2022-10-15 NOTE — Progress Notes (Addendum)
NAME:  Raymond Hood, MRN:  [id number], DOB:  05-09-1970, LOS: 2 ADMISSION DATE:  11/01/2022, CONSULTATION DATE:  10/14/2022 REFERRING MD:  Dr. Antionette Char - TRh, CHIEF COMPLAINT:  Cardiac arrest    History of Present Illness:  Raymond Hood is a 52 year old male with a past medical history significant for COPD, OSA/OHS, chronic HFpEF, type 2 diabetes, obesity, and schizoaffective disorder who presented to the ED at [facility] long 10/3 after suffering mechanical fall resulting in bilateral leg and lower back pain.  Post fall patient was able to bear weight on lower extremities and ambulate but reports lower back and leg pain.  On ED arrival patient was seen hypoxic, tachypneic, and slightly tachycardic but hemodynamically stable.  CT chest/abdomen/pelvis notable for diffuse body wall edema and small right pleural effusion.  Patient was admitted per hospitalist for decompensated HFpEF and hypoxic respiratory failure.  Overnight/early a.m. 10/4 patient's suffered in-hospital cardiac arrest.  Per report patient became hypoxic and bradycardic with transition to PEA cardiac arrest with approximate 15 to 20 minutes of downtime.  PCCM consulted for further assistance in management.  Pertinent  Medical History  COPD, OSA/OHS, chronic HFpEF, type 2 diabetes, obesity, and schizoaffective disorder   Significant Hospital Events: Including procedures, antibiotic start and stop dates in addition to other pertinent events   10/3 presented after mechanical fall with a chief complaint of back pain and bilateral leg pain admitted for decompensated HFpEF and hypoxic respiratory failure 10/4 and hospital PEA cardiac arrest, approximate 20-minute downtime  Interim History / Subjective:  Weaned off vasopressors, A-fib with RVR persists  Objective:  Blood pressure (!) 155/89, pulse 74, temperature 98.5 F (36.9 C), temperature source Axillary, resp. rate (!) 28, height 5\' 8"  (1.727 m), weight (!) 213.3 kg, SpO2 93%.    Vent  Mode: PRVC FiO2 (%):  [50 %-70 %] 60 % Set Rate:  [28 bmp] 28 bmp Vt Set:  [540 mL] 540 mL PEEP:  [5 cmH20-8 cmH20] 8 cmH20 Plateau Pressure:  [26 cmH20-28 cmH20] 26 cmH20   Intake/Output Summary (Last 24 hours) at 10/15/2022 0855 Last data filed at 10/15/2022 0800 Gross per 24 hour  Intake 1988.6 ml  Output 2350 ml  Net -361.4 ml   Filed Weights   10/14/22 0330 10/15/22 0500  Weight: (!) 167.4 kg (!) 213.3 kg   Physical Examination: General: Acutely ill-appearing middle-aged man in NAD. Intubated. HEENT: [place]/AT, anicteric sclera, pupils pinpoint, moist mucous membranes. ETT/OGT in place. Neuro:  Intubated, unresponsive off of sedation.  Does not respond to verbal, tactile or noxious stimuli. Does not withdraw to pain. Not following commands.  Spontaneous movements of lower extremities noted. +Corneal, minimal cough/gag. CV: Irregularly irregular rhythm, rate 140s, no m/g/r. PULM: Breathing even and unlabored on vent (PEEP 5, FiO2 60%). Lung fields diminished bilaterally. GI: Obese, soft, mildly distended. Not apparently TTP, LUQ with ?guarding. Hypoactive bowel sounds. Extremities: Bilateral symmetric LE edema noted with erythema, superficial skin wounds.  Skin: Warm/dry, no rashes. Bilateral LE edema.  Resolved Hospital Problem list     Assessment & Plan:  Acute hypoxemic respiratory failure: Presumed CHF exacerbation, pulmonary edema.  Possible aspiration with code event. -- IV diuresis, increase Lasix dose to 80 mg IV -- PRVC, VAP bundle, stress ulcer prophylaxis, PAD protocol -- Wean FiO2 as able goal O2 saturation 90%, increase PEEP to 8 10/5, driving pressures improved, plateau pressures unchanged with increase PEEP -- Nebulized bronchodilators given wheeze on exam --Continue Unasyn   PEA arrest: Incentive hypoxemia and refusal of  NIPPV.  25 minutes resuscitation. -- Minimize sedating agents for ongoing neuro prognostication, administer PRNs for vent this  including --Giving time for neuro prognostication but early neurologic recovery is poor, prognosis is grim   Hypotension/shock: After PEA arrest.  Improving vasopressors now weaned off. -- Continue diuresis given hypervolemia   A-fib with RVR: New onset.  Likely in the setting of arrest and cardiac abnormalities -- IV amiodarone -Oral metoprolol 25 mg 4 times daily started 10/5 given improvement in blood pressure --heparin gtt  AKI: Suspect hypervolemic, stable -- More aggressive diuresis   Right lower extremity cellulitis versus chronic venous changes -- Continue doxycycline  Best Practice (right click and "Reselect all SmartList Selections" daily)   Diet/type: Start tube feeds DVT prophylaxis: LMWH GI prophylaxis: PPI Lines: N/A Foley:  N/A Code Status:  full code Last date of multidisciplinary goals of care discussion: Pending  Critical care time:    CRITICAL CARE Performed by: Karren Burly   Total critical care time: 31 minutes  Critical care time was exclusive of separately billable procedures and treating other patients.  Critical care was necessary to treat or prevent imminent or life-threatening deterioration.  Critical care was time spent personally by me on the following activities: development of treatment plan with patient and/or surrogate as well as nursing, discussions with consultants, evaluation of patient's response to treatment, examination of patient, obtaining history from patient or surrogate, ordering and performing treatments and interventions, ordering and review of laboratory studies, ordering and review of radiographic studies, pulse oximetry and re-evaluation of patient's condition.   Karren Burly, MD [facility] 10/15/22 8:55 AM  Please see [email or web address] for pager details.  From 7A-7P if no response, please call [phone] After hours, please call ELink [phone]

## 2022-10-15 NOTE — Progress Notes (Signed)
PHARMACY - ANTICOAGULATION CONSULT NOTE  Pharmacy Consult for heparin Indication: atrial fibrillation  No Known Allergies  Patient Measurements: Height: 5\' 8"  (172.7 cm) Weight: (!) 167.4 kg (369 lb 0.8 oz) IBW/kg (Calculated) : 68.4 Heparin Dosing Weight: 110 kg  Vital Signs: Temp: 99.3 F (37.4 C) (10/04 2349) Temp Source: Axillary (10/04 2349) BP: 120/69 (10/04 2349) Pulse Rate: 61 (10/05 0200)  Labs: Recent Labs    10/14/22 0419 10/14/22 0419 10/14/22 0728 10/14/22 1111 10/14/22 1403 10/14/22 1635 10/15/22 0312  HGB 14.8  --  15.8  --   --   --  14.9  HCT 52.7*  --  53.4*  --   --   --  48.9  PLT 264  --  335  --   --   --  236  HEPARINUNFRC  --   --   --   --   --   --  0.44  CREATININE 1.40*  --  1.47*  --   --   --  1.49*  TROPONINIHS  --    < > 224* 755* 908* 774*  --    < > = values in this interval not displayed.    Estimated Creatinine Clearance: 91.6 mL/min (A) (by C-G formula based on SCr of 1.49 mg/dL (H)).   Medical History: Past Medical History:  Diagnosis Date   AKI (acute kidney injury) (HCC) 07/27/2021   Bipolar 1 disorder, depressed (HCC)    CAP (community acquired pneumonia) 07/14/2021   COPD (chronic obstructive pulmonary disease) (HCC)    Depression with anxiety    Diabetes mellitus type II, non insulin dependent (HCC) 11/2020   A1c 7.3   Fall at home, initial encounter 05/28/2020   Last Assessment & Plan:  Formatting of this note might be different from the original. Acute onset, occurred few days ago.  Reports having a mechanical fall, fell forward injuring his left side including arm, knee.  Denies any vision changes, head trauma or loss of consciousness.  Patient is being referred to the emergency room for lower GI bleed, can also get evaluated for his injuries.   Gastric ulcer    when on NSAIDs   GERD (gastroesophageal reflux disease)    Helicobacter pylori antibody positive 07/04/2018   Formatting of this note might be different  from the original. Started triple therapy 06/30/18.   Needs HP stool antigen in 8 weeks,  Off PPI for 10 days.   HTN (hypertension)    OSA (obstructive sleep apnea)    not currently on CPAP   Rheumatoid arthritis (HCC)    Right kidney mass, 2.8 cm 11/2020   Tobacco abuse      Assessment: 52 year old male presented after fall. He was admitted with hypervolemia and acute hypoxic respiratory failure. He was supposed to be on CPAP but refused - 10/4 am, pt became bradycardic and subsequently went into cardiac arrest. He was intubated. ROSC achieved after ~ 20 minutes. He was found to be tachycardic and given amiodarone; also requiring vasopressors at that time. Patient with atrial fibrillation post-arrest started on amiodarone infusion. Pharmacy consulted to manage heparin.  10/15/2022 HL 0.44 therapeutic on 1600 units/hr CBC WNL, Scr up to 1.49 No bleeding noted  Goal of Therapy:  Heparin level 0.3-0.7 units/ml Monitor platelets by anticoagulation protocol: Yes   Plan:  --continue heparin infusion at 1600 units/hr -Check 6 hour heparin level -Daily CBC while on heparin infusion -Monitor for signs/symptoms of bleeding  Arley Phenix RPh 10/15/2022, 3:54  AM

## 2022-10-15 NOTE — Progress Notes (Signed)
Brief Nutrition Support Note  Received consult for Enteral/tube feeding initiation and management.  Patient being followed by RD, last note yesterday.  Patient now off vasopressors. OGT xray verified in the stomach yesterday.  Intervention:  - Per CCM can start tube feeds today: Pivot 1.5 at 50 ml/h (120 ml per day) *Start at 46mL/hr and advance by 10mL Q6H Prosource TF20 60 ml TID Provides 2040 kcal, 172 gm protein, 900 ml free water daily   - FWF per CCM/MD.    Shelle Iron RD, LDN For contact information, refer to [facility].

## 2022-10-15 NOTE — TOC Progression Note (Addendum)
Transition of Care [facility]) - Progression Note    Patient Details  Name: Raymond Hood MRN: [id number] Date of Birth: 26-Feb-1970  [facility] of [facility]) CM/SW Contact  Adrian Prows, RN Phone Number: 10/15/2022, 11:28 AM  Clinical Narrative:    Pt is intubated on vent; TOC is following.   Expected Discharge Plan: Home/Self Care Barriers to Discharge: Continued Medical Work up  Expected Discharge Plan and Services In-house Referral: Clinical Social Work Discharge Planning Services: NA Post Acute Care Choice: NA Living arrangements for the past 2 months: Apartment                                       Social Determinants of Health (SDOH) Interventions SDOH Screenings   Food Insecurity: Food Insecurity Present (10/25/2022)  Housing: Low Risk  (10/30/2022)  Transportation Needs: No Transportation Needs (10/25/2022)  Utilities: Not At Risk (10/23/2022)  Depression (PHQ2-9): High Risk (06/23/2022)  Financial Resource Strain: High Risk (11/30/2020)   [facility], [facility]  Physical Activity: Inactive (12/01/2020)   [facility], [facility]  Social Connections: Unknown (05/23/2021)   Received from [facility], [facility]  Stress: Stress Concern Present (11/30/2020)   Received from [facility], [facility]  Tobacco Use: High Risk (10/26/2022)    Readmission Risk Interventions    10/14/2022   11:09 AM  Readmission Risk Prevention Plan  Transportation Screening Complete  PCP or Specialist Appt within 5-7 Days Complete  Home Care Screening Complete  Medication Review (RN CM) Complete

## 2022-10-15 NOTE — Progress Notes (Signed)
PHARMACY - ANTICOAGULATION CONSULT NOTE  Pharmacy Consult for Heparin Indication: atrial fibrillation  No Known Allergies  Patient Measurements: Height: 5\' 8"  (172.7 cm) Weight: (!) 213.3 kg (470 lb 3.9 oz) IBW/kg (Calculated) : 68.4 Heparin Dosing Weight: 110 kg  Vital Signs: Temp: 98.5 F (36.9 C) (10/05 0836) Temp Source: Axillary (10/05 0836) BP: 155/89 (10/05 0401) Pulse Rate: 88 (10/05 1000)  Labs: Recent Labs    10/14/22 0419 10/14/22 0419 10/14/22 0728 10/14/22 1111 10/14/22 1403 10/14/22 1635 10/15/22 0312 10/15/22 1005  HGB 14.8  --  15.8  --   --   --  14.9  --   HCT 52.7*  --  53.4*  --   --   --  48.9  --   PLT 264  --  335  --   --   --  236  --   HEPARINUNFRC  --   --   --   --   --   --  0.44 0.23*  CREATININE 1.40*  --  1.47*  --   --   --  1.49*  --   TROPONINIHS  --    < > 224* 755* 908* 774*  --   --    < > = values in this interval not displayed.    Estimated Creatinine Clearance: 107.2 mL/min (A) (by C-G formula based on SCr of 1.49 mg/dL (H)).   Medical History: Past Medical History:  Diagnosis Date   AKI (acute kidney injury) (HCC) 07/27/2021   Bipolar 1 disorder, depressed (HCC)    CAP (community acquired pneumonia) 07/14/2021   COPD (chronic obstructive pulmonary disease) (HCC)    Depression with anxiety    Diabetes mellitus type II, non insulin dependent (HCC) 11/2020   A1c 7.3   Fall at home, initial encounter 05/28/2020   Last Assessment & Plan:  Formatting of this note might be different from the original. Acute onset, occurred few days ago.  Reports having a mechanical fall, fell forward injuring his left side including arm, knee.  Denies any vision changes, head trauma or loss of consciousness.  Patient is being referred to the emergency room for lower GI bleed, can also get evaluated for his injuries.   Gastric ulcer    when on NSAIDs   GERD (gastroesophageal reflux disease)    Helicobacter pylori antibody positive 07/04/2018    Formatting of this note might be different from the original. Started triple therapy 06/30/18.   Needs HP stool antigen in 8 weeks,  Off PPI for 10 days.   HTN (hypertension)    OSA (obstructive sleep apnea)    not currently on CPAP   Rheumatoid arthritis (HCC)    Right kidney mass, 2.8 cm 11/2020   Tobacco abuse      Assessment: AC/Heme:  - IV heparin for afib. CHADS2VASC at least 2. - Hep level 0.23 slightly low - Hgb 14.9, Plt 335>236 watch (previously 200's)  Goal of Therapy:  Heparin level 0.3-0.7 units/ml Monitor platelets by anticoagulation protocol: Yes   Plan:  IV heparin 1500 unit IV bolus Increase infusion to 1850 units/hr Check heparin level 8 hrs after rate change Daily HL and CBC   Humbert Morozov S. Merilynn Finland, PharmD, BCPS Clinical Staff Pharmacist [email or web address] Merilynn Finland, [employer] 10/15/2022,11:11 AM

## 2022-10-15 NOTE — Progress Notes (Signed)
PHARMACY - ANTICOAGULATION CONSULT NOTE  Pharmacy Consult for Heparin Indication: atrial fibrillation  No Known Allergies  Patient Measurements: Height: 5\' 8"  (172.7 cm) (copied from earlier entry for heparin dosing weight) Weight: (!) 213.2 kg (470 lb) (copied from earlier entry to calculate heparin dosing weight) IBW/kg (Calculated) : 68.4 Heparin Dosing Weight: 110 kg  Vital Signs: Temp: 98.5 F (36.9 C) (10/05 1801) Temp Source: Axillary (10/05 1801) BP: 153/82 (10/05 1936) Pulse Rate: 85 (10/05 1936)  Labs: Recent Labs    10/14/22 0419 10/14/22 0419 10/14/22 0728 10/14/22 1111 10/14/22 1403 10/14/22 1635 10/15/22 0312 10/15/22 1005 10/15/22 1656 10/15/22 2037  HGB 14.8  --  15.8  --   --   --  14.9  --   --   --   HCT 52.7*  --  53.4*  --   --   --  48.9  --   --   --   PLT 264  --  335  --   --   --  236  --   --   --   HEPARINUNFRC  --   --   --   --   --   --  0.44 0.23*  --  0.25*  CREATININE 1.40*  --  1.47*  --   --   --  1.49*  --  1.60*  --   TROPONINIHS  --    < > 224* 755* 908* 774*  --   --   --   --    < > = values in this interval not displayed.    Estimated Creatinine Clearance: 99.8 mL/min (A) (by C-G formula based on SCr of 1.6 mg/dL (H)).   Medical History: Past Medical History:  Diagnosis Date   AKI (acute kidney injury) (HCC) 07/27/2021   Bipolar 1 disorder, depressed (HCC)    CAP (community acquired pneumonia) 07/14/2021   COPD (chronic obstructive pulmonary disease) (HCC)    Depression with anxiety    Diabetes mellitus type II, non insulin dependent (HCC) 11/2020   A1c 7.3   Fall at home, initial encounter 05/28/2020   Last Assessment & Plan:  Formatting of this note might be different from the original. Acute onset, occurred few days ago.  Reports having a mechanical fall, fell forward injuring his left side including arm, knee.  Denies any vision changes, head trauma or loss of consciousness.  Patient is being referred to the  emergency room for lower GI bleed, can also get evaluated for his injuries.   Gastric ulcer    when on NSAIDs   GERD (gastroesophageal reflux disease)    Helicobacter pylori antibody positive 07/04/2018   Formatting of this note might be different from the original. Started triple therapy 06/30/18.   Needs HP stool antigen in 8 weeks,  Off PPI for 10 days.   HTN (hypertension)    OSA (obstructive sleep apnea)    not currently on CPAP   Rheumatoid arthritis (HCC)    Right kidney mass, 2.8 cm 11/2020   Tobacco abuse      Assessment: AC/Heme:  - IV heparin for afib. CHADS2VASC at least 2. - Hep level remains slightly low at 0.25 despite rate increase to 1850 units/hr - Hgb 14.9, Plt 335>236 watch (previously 200's) - SCr continues to rise, now 1.8x baseline  Goal of Therapy:  Heparin level 0.3-0.7 units/ml Monitor platelets by anticoagulation protocol: Yes   Plan: IV heparin bolus 4000 units x 1 Increase infusion to 2150 units/hr  Check heparin level 8 hrs after rate change Daily HL and CBC  Yadier Bramhall A, PharmD 10/15/2022,9:20 PM

## 2022-10-15 NOTE — Progress Notes (Signed)
[facility] ADULT ICU REPLACEMENT PROTOCOL   The patient does apply for the [facility] Adult ICU Electrolyte Replacment Protocol based on the criteria listed below:   1.Exclusion criteria: TCTS, ECMO, Dialysis, and Myasthenia Gravis patients 2. Is GFR >/= 30 ml/min? Yes.    Patient's GFR today is 57 3. Is SCr </= 2? Yes.   Patient's SCr is 1.49 mg/dL 4. Did SCr increase >/= 0.5 in 24 hours? No. 5.[name] weight >40kg  Yes.   6. Abnormal electrolyte(s): K+ = 3.2  7. Electrolytes replaced per protocol 8.  Call MD STAT for K+ </= 2.5, Phos </= 1, or Mag </= 1 Physician:  Delia Chimes, eMD  Faye Ramsay 10/15/2022 3:57 AM

## 2022-10-16 DIAGNOSIS — R601 Generalized edema: Secondary | ICD-10-CM | POA: Diagnosis not present

## 2022-10-16 LAB — CBC
HCT: 49.1 % (ref 39.0–52.0)
Hemoglobin: 14.5 g/dL (ref 13.0–17.0)
MCH: 26.2 pg (ref 26.0–34.0)
MCHC: 29.5 g/dL — ABNORMAL LOW (ref 30.0–36.0)
MCV: 88.6 fL (ref 80.0–100.0)
Platelets: 208 10*3/uL (ref 150–400)
RBC: 5.54 MIL/uL (ref 4.22–5.81)
RDW: 17.2 % — ABNORMAL HIGH (ref 11.5–15.5)
WBC: 12.2 10*3/uL — ABNORMAL HIGH (ref 4.0–10.5)
nRBC: 0 % (ref 0.0–0.2)

## 2022-10-16 LAB — BASIC METABOLIC PANEL
Anion gap: 12 (ref 5–15)
BUN: 34 mg/dL — ABNORMAL HIGH (ref 6–20)
CO2: 31 mmol/L (ref 22–32)
Calcium: 7.5 mg/dL — ABNORMAL LOW (ref 8.9–10.3)
Chloride: 100 mmol/L (ref 98–111)
Creatinine, Ser: 1.31 mg/dL — ABNORMAL HIGH (ref 0.61–1.24)
GFR, Estimated: >60 mL/min (ref >60)
Glucose, Bld: 194 mg/dL — ABNORMAL HIGH (ref 70–99)
Potassium: 3.3 mmol/L — ABNORMAL LOW (ref 3.5–5.1)
Sodium: 143 mmol/L (ref 135–145)

## 2022-10-16 LAB — HEPARIN LEVEL (UNFRACTIONATED)
Heparin Unfractionated: 0.25 [IU]/mL — ABNORMAL LOW (ref 0.30–0.70)
Heparin Unfractionated: 0.35 [IU]/mL (ref 0.30–0.70)
Heparin Unfractionated: 0.19 [IU]/mL — ABNORMAL LOW (ref 0.30–0.70)

## 2022-10-16 LAB — GLUCOSE, CAPILLARY
Glucose-Capillary: 177 mg/dL — ABNORMAL HIGH (ref 70–99)
Glucose-Capillary: 211 mg/dL — ABNORMAL HIGH (ref 70–99)
Glucose-Capillary: 217 mg/dL — ABNORMAL HIGH (ref 70–99)
Glucose-Capillary: 184 mg/dL — ABNORMAL HIGH (ref 70–99)
Glucose-Capillary: 174 mg/dL — ABNORMAL HIGH (ref 70–99)
Glucose-Capillary: 198 mg/dL — ABNORMAL HIGH (ref 70–99)

## 2022-10-16 LAB — MAGNESIUM
Magnesium: 2.5 mg/dL — ABNORMAL HIGH (ref 1.7–2.4)
Magnesium: 2.3 mg/dL (ref 1.7–2.4)

## 2022-10-16 LAB — CULTURE, RESPIRATORY W GRAM STAIN

## 2022-10-16 LAB — PHOSPHORUS
Phosphorus: 3.9 mg/dL (ref 2.5–4.6)
Phosphorus: 3.5 mg/dL (ref 2.5–4.6)

## 2022-10-16 MED ORDER — POTASSIUM CHLORIDE 20 MEQ PO PACK
40.0000 meq | PACK | ORAL | Status: AC
  Administered 2022-10-16 (×2): 40 meq
  Filled 2022-10-16 (×2): qty 2

## 2022-10-16 MED ORDER — ORAL CARE MOUTH RINSE: 15.0000 mL | OROMUCOSAL | Status: DC | PRN

## 2022-10-16 MED ORDER — HEPARIN BOLUS VIA INFUSION
2000.0000 [IU] | Freq: Once | INTRAVENOUS | Status: AC
  Administered 2022-10-16: 2000 [IU] via INTRAVENOUS
  Filled 2022-10-16: qty 2000

## 2022-10-16 MED ORDER — FUROSEMIDE 10 MG/ML IJ SOLN
80.0000 mg | Freq: Once | INTRAMUSCULAR | Status: AC
  Administered 2022-10-16: 80 mg via INTRAVENOUS
  Filled 2022-10-16: qty 8

## 2022-10-16 MED ORDER — METOPROLOL TARTRATE 12.5 MG HALF TABLET
12.5000 mg | ORAL_TABLET | Freq: Four times a day (QID) | ORAL | Status: DC
  Administered 2022-10-16 – 2022-10-18 (×5): 12.5 mg
  Filled 2022-10-16 (×6): qty 1

## 2022-10-16 MED ORDER — SODIUM CHLORIDE 0.9% FLUSH
10.0000 mL | INTRAVENOUS | Status: DC | PRN
  Administered 2022-10-19 – 2022-10-20 (×2): 10 mL

## 2022-10-16 MED ORDER — ORAL CARE MOUTH RINSE
15.0000 mL | OROMUCOSAL | Status: DC
  Administered 2022-10-16 – 2022-10-20 (×43): 15 mL via OROMUCOSAL

## 2022-10-16 NOTE — TOC Progression Note (Signed)
Transition of Care [facility]) - Progression Note    Patient Details  Name: Raymond Hood MRN: [id number] Date of Birth: 04-05-1970  Transition of Care [facility]) CM/SW Contact  Adrian Prows, RN Phone Number: 10/16/2022, 3:29 PM  Clinical Narrative:    Pt remains on vent; unable to discuss d/c planning.   Expected Discharge Plan: Home/Self Care Barriers to Discharge: Continued Medical Work up  Expected Discharge Plan and Services In-house Referral: Clinical Social Work Discharge Planning Services: NA Post Acute Care Choice: NA Living arrangements for the past 2 months: Apartment                                       Social Determinants of Health (SDOH) Interventions SDOH Screenings   Food Insecurity: Food Insecurity Present (November 08, 2022)  Housing: Low Risk  (11/08/2022)  Transportation Needs: No Transportation Needs (11-08-2022)  Utilities: Not At Risk (11-08-22)  Depression (PHQ2-9): High Risk (06/23/2022)  Financial Resource Strain: High Risk (11/30/2020)   Received from [facility], [facility]  Physical Activity: Inactive (12/01/2020)   Received from [facility], [facility]  Social Connections: Unknown (05/23/2021)   Received from [facility], [facility]  Stress: Stress Concern Present (11/30/2020)   Received from [facility], [facility]  Tobacco Use: High Risk (11/08/22)    Readmission Risk Interventions    10/14/2022   11:09 AM  Readmission Risk Prevention Plan  Transportation Screening Complete  PCP or Specialist Appt within 5-7 Days Complete  Home Care Screening Complete  Medication Review (RN CM) Complete

## 2022-10-16 NOTE — Progress Notes (Addendum)
PHARMACY - ANTICOAGULATION CONSULT NOTE  Pharmacy Consult for Heparin Indication: atrial fibrillation  No Known Allergies  Patient Measurements: Height: 5\' 8"  (172.7 cm) (copied from earlier entry for heparin dosing weight) Weight: (!) 209.9 kg (462 lb 11.9 oz) IBW/kg (Calculated) : 68.4 Heparin Dosing Weight: 123 kg  Vital Signs: Temp: 101.9 F (38.8 C) (10/06 0400) Temp Source: Axillary (10/06 0400) BP: 133/68 (10/06 0358) Pulse Rate: 78 (10/06 0600)  Labs: Recent Labs    10/14/22 0728 10/14/22 0728 10/14/22 1111 10/14/22 1403 10/14/22 1635 10/15/22 0312 10/15/22 1005 10/15/22 1656 10/15/22 2037 10/16/22 0500 10/16/22 0600  HGB 15.8  --   --   --   --  14.9  --   --   --  14.5  --   HCT 53.4*  --   --   --   --  48.9  --   --   --  49.1  --   PLT 335  --   --   --   --  236  --   --   --  208  --   HEPARINUNFRC  --    < >  --   --   --  0.44 0.23*  --  0.25*  --  0.25*  CREATININE 1.47*  --   --   --   --  1.49*  --  1.60*  --  1.31*  --   TROPONINIHS 224*  --  755* 908* 774*  --   --   --   --   --   --    < > = values in this interval not displayed.    Estimated Creatinine Clearance: 120.6 mL/min (A) (by C-G formula based on SCr of 1.31 mg/dL (H)).   Medical History: Past Medical History:  Diagnosis Date   AKI (acute kidney injury) (HCC) 07/27/2021   Bipolar 1 disorder, depressed (HCC)    CAP (community acquired pneumonia) 07/14/2021   COPD (chronic obstructive pulmonary disease) (HCC)    Depression with anxiety    Diabetes mellitus type II, non insulin dependent (HCC) 11/2020   A1c 7.3   Fall at home, initial encounter 05/28/2020   Last Assessment & Plan:  Formatting of this note might be different from the original. Acute onset, occurred few days ago.  Reports having a mechanical fall, fell forward injuring his left side including arm, knee.  Denies any vision changes, head trauma or loss of consciousness.  Patient is being referred to the emergency room  for lower GI bleed, can also get evaluated for his injuries.   Gastric ulcer    when on NSAIDs   GERD (gastroesophageal reflux disease)    Helicobacter pylori antibody positive 07/04/2018   Formatting of this note might be different from the original. Started triple therapy 06/30/18.   Needs HP stool antigen in 8 weeks,  Off PPI for 10 days.   HTN (hypertension)    OSA (obstructive sleep apnea)    not currently on CPAP   Rheumatoid arthritis (HCC)    Right kidney mass, 2.8 cm 11/2020   Tobacco abuse      Assessment: AC/Heme:  - IV heparin for afib. CHADS2VASC at least 2. - Hep level 0.25 remains low - Hgb 14.5, Plt 335>208 watch (previously 200's) - 10/5: RN reports no further bleeding overnight. (Some central line bleeding yesterday afternoon)  Goal of Therapy:  Heparin level 0.3-0.7 units/ml Monitor platelets by anticoagulation protocol: Yes   Plan:  Heparin 2000  unit IV bolus Increase infusion to 2400 units/hr Check heparin level in 6-8 hrs Daily HL and CBC   Tiara Bartoli S. Merilynn Finland, PharmD, BCPS Clinical Staff Pharmacist [email or web address] Merilynn Finland, [employer] 10/16/2022,7:27 AM

## 2022-10-16 NOTE — Progress Notes (Signed)
NAME:  Parv Rashid, MRN:  [id number], DOB:  10-10-1970, LOS: 3 ADMISSION DATE:  10/19/2022, CONSULTATION DATE:  10/14/2022 REFERRING MD:  Dr. Antionette Char - [facility], CHIEF COMPLAINT:  Cardiac arrest    History of Present Illness:  Raymond Hood is a 52 year old male with a past medical history significant for COPD, OSA/OHS, chronic HFpEF, type 2 diabetes, obesity, and schizoaffective disorder who presented to the ED at [facility] long 10/3 after suffering mechanical fall resulting in bilateral leg and lower back pain.  Post fall patient was able to bear weight on lower extremities and ambulate but reports lower back and leg pain.  On ED arrival patient was seen hypoxic, tachypneic, and slightly tachycardic but hemodynamically stable.  CT chest/abdomen/pelvis notable for diffuse body wall edema and small right pleural effusion.  Patient was admitted per hospitalist for decompensated HFpEF and hypoxic respiratory failure.  Overnight/early a.m. 10/4 patient's suffered in-hospital cardiac arrest.  Per report patient became hypoxic and bradycardic with transition to PEA cardiac arrest with approximate 15 to 20 minutes of downtime.  PCCM consulted for further assistance in management.  Pertinent  Medical History  COPD, OSA/OHS, chronic HFpEF, type 2 diabetes, obesity, and schizoaffective disorder   Significant Hospital Events: Including procedures, antibiotic start and stop dates in addition to other pertinent events   10/3 presented after mechanical fall with a chief complaint of back pain and bilateral leg pain admitted for decompensated HFpEF and hypoxic respiratory failure 10/4 and hospital PEA cardiac arrest, approximate 20-minute downtime  Interim History / Subjective:  Weaned off vasopressors then returned yesterday afternoon now back off this morning, A-fib with RVR persists, plan retrial with oral metoprolol, good urine output, creatinine improved  Objective:  Blood pressure 133/68, pulse 74, temperature  98.8 F (37.1 C), temperature source Oral, resp. rate (!) 29, height 5\' 8"  (1.727 m), weight (!) 209.9 kg, SpO2 98%.    Vent Mode: PRVC FiO2 (%):  [50 %-60 %] 50 % Set Rate:  [28 bmp] 28 bmp Vt Set:  [540 mL] 540 mL PEEP:  [8 cmH20] 8 cmH20 Plateau Pressure:  [25 cmH20-31 cmH20] 26 cmH20   Intake/Output Summary (Last 24 hours) at 10/16/2022 1034 Last data filed at 10/16/2022 8657 Gross per 24 hour  Intake 2534.44 ml  Output 2120 ml  Net 414.44 ml   Filed Weights   10/15/22 0500 10/15/22 2100 10/16/22 0405  Weight: (!) 213.3 kg (!) 213.2 kg (!) 209.9 kg   Physical Examination: General: Acutely ill-appearing middle-aged man in NAD. Intubated. HEENT: [place]/AT, anicteric sclera, pupils pinpoint, moist mucous membranes. ETT/OGT in place. Neuro:  Intubated, unresponsive off of sedation.  Does not respond to verbal, tactile or noxious stimuli. Does not withdraw to pain. Not following commands.  Opens eyes to voice, upward gaze preference +Corneal, minimal cough/gag. CV: Irregularly irregular rhythm, rate 140s, no m/g/r. PULM: Breathing even and unlabored on vent (PEEP 5, FiO2 50%). Lung fields diminished bilaterally. GI: Obese, soft, mildly distended. Not apparently TTP, LUQ with ?guarding. Hypoactive bowel sounds. Extremities: Bilateral symmetric LE edema noted with erythema, superficial skin wounds.  Skin: Warm/dry, no rashes. Bilateral LE edema.  Resolved Hospital Problem list     Assessment & Plan:  Acute hypoxemic respiratory failure: Presumed CHF exacerbation, pulmonary edema.  Possible aspiration with code event. -- IV diuresis, good urine output with Lasix 80 mg IV -- PRVC, VAP bundle, stress ulcer prophylaxis, PAD protocol -- Wean FiO2 as able goal O2 saturation 90%, increase PEEP to 8 10/5, driving pressures improved,  plateau pressures unchanged with increase PEEP -- Nebulized bronchodilators given wheeze on exam --Continue Unasyn plan 7 days   PEA arrest with resultant  metabolic encephalopathy: In setting of hypoxemia and refusal of NIPPV.  25 minutes resuscitation.  Neurologic status remains very poor but opening eyes a little bit, may be slightly better but overall no significant improvement -- Minimize sedating agents for ongoing neuro prognostication, administer PRNs for vent this including -- Opening eyes more but still not following commands, no significant neurologic recovery   Hypotension/shock: After PEA arrest.  Improving vasopressors now weaned off. -- Continue diuresis given hypervolemia --Weaned off norepinephrine   A-fib with RVR: New onset.  Likely in the setting of arrest and cardiac abnormalities -- IV amiodarone -Oral metoprolol 12. 5 mg 4 times daily started 10/6, attempted higher dose 10/5 with mild hypotension --heparin gtt  AKI: Suspect hypervolemic, stable -- More aggressive diuresis   Right lower extremity cellulitis versus chronic venous changes -- Continue doxycycline  Best Practice (right click and "Reselect all SmartList Selections" daily)   Diet/type: Tube feeds DVT prophylaxis: LMWH GI prophylaxis: PPI Lines: N/A Foley:  N/A Code Status:  full code Last date of multidisciplinary goals of care discussion: Discussed with friend only known contact 10/5, grim prognosis, allowing some time to see if there are recovery  Critical care time:    CRITICAL CARE Performed by: Karren Burly   Total critical care time: 35 minutes  Critical care time was exclusive of separately billable procedures and treating other patients.  Critical care was necessary to treat or prevent imminent or life-threatening deterioration.  Critical care was time spent personally by me on the following activities: development of treatment plan with patient and/or surrogate as well as nursing, discussions with consultants, evaluation of patient's response to treatment, examination of patient, obtaining history from patient or surrogate,  ordering and performing treatments and interventions, ordering and review of laboratory studies, ordering and review of radiographic studies, pulse oximetry and re-evaluation of patient's condition.   Karren Burly, MD [facility] 10/16/22 10:34 AM  Please see [email or web address] for pager details.  From 7A-7P if no response, please call [phone] After hours, please call ELink [phone]

## 2022-10-16 NOTE — Progress Notes (Signed)
eLink Physician-Brief Progress Note Patient Name: Raymond Hood DOB: May 16, 1970 MRN: [id number]   Date of Service  10/16/2022  HPI/Events of Note  IJ triple-lumen catheter in place.  2 ports functioning 1 port with inability to flush or draw back.  eICU Interventions  tPA for 1 lumen     Intervention Category Minor Interventions: Routine modifications to care plan (e.g. PRN medications for pain, fever)  Kwame Ryland 10/16/2022, 5:01 AM

## 2022-10-16 NOTE — Plan of Care (Signed)
Patient remains critically ill. Oxygen requirements on ventilator decreased. GOC meeting planned in the next few days.

## 2022-10-16 NOTE — Progress Notes (Signed)
PHARMACY - ANTICOAGULATION CONSULT NOTE  Pharmacy Consult for Heparin Indication: atrial fibrillation  No Known Allergies  Patient Measurements: Height: 5\' 8"  (172.7 cm) Weight: (!) 209.9 kg (462 lb 11.9 oz) IBW/kg (Calculated) : 68.4 Heparin Dosing Weight: 123 kg  Vital Signs: Temp: 98.8 F (37.1 C) (10/06 0839) Temp Source: Oral (10/06 0839) BP: 138/76 (10/06 1234) Pulse Rate: 86 (10/06 1500)  Labs: Recent Labs    10/14/22 0728 10/14/22 0728 10/14/22 1111 10/14/22 1403 10/14/22 1635 10/15/22 0312 10/15/22 1005 10/15/22 1656 10/15/22 2037 10/16/22 0500 10/16/22 0600  HGB 15.8  --   --   --   --  14.9  --   --   --  14.5  --   HCT 53.4*  --   --   --   --  48.9  --   --   --  49.1  --   PLT 335  --   --   --   --  236  --   --   --  208  --   HEPARINUNFRC  --    < >  --   --   --  0.44 0.23*  --  0.25*  --  0.25*  CREATININE 1.47*  --   --   --   --  1.49*  --  1.60*  --  1.31*  --   TROPONINIHS 224*  --  755* 908* 774*  --   --   --   --   --   --    < > = values in this interval not displayed.    Estimated Creatinine Clearance: 120.6 mL/min (A) (by C-G formula based on SCr of 1.31 mg/dL (H)).    Brief Assessment:  See full note earlier today from C.Merilynn Finland PharmD 51 yoM on heparin per pharmacy for Afib. Heparin level 0.35, increased to therapeutic on heparin 2400 units/hr  No bleeding, complications, IV pauses/interruptions reported by RN.   Goal of Therapy:  Heparin level 0.3-0.7 units/ml Monitor platelets by anticoagulation protocol: Yes   Plan:  Continue heparin IV infusion at 2400 units/hr Confirmatory heparin level 6 hours Daily heparin level and CBC    Lynann Beaver PharmD, BCPS [facility] main pharmacy [phone] 10/16/2022 4:07 PM

## 2022-10-16 NOTE — Progress Notes (Signed)
Difficulty with 2 lumens without blood return.  Noted a potential kink at insertion site.  Spoke with RN and Dr Judeth Horn.  Dressing removed, sutures removed, new dressing applied per protocol.  Retracted CVC approximately half a centimeter, repositioned to prevent kinking, all lumens currently flushing and giving back great blood return.  Dressing applied per protocol.  RN notified.

## 2022-10-17 ENCOUNTER — Ambulatory Visit: Payer: MEDICAID | Admitting: Podiatry

## 2022-10-17 DIAGNOSIS — J4489 Other specified chronic obstructive pulmonary disease: Secondary | ICD-10-CM | POA: Diagnosis not present

## 2022-10-17 DIAGNOSIS — I509 Heart failure, unspecified: Secondary | ICD-10-CM | POA: Diagnosis not present

## 2022-10-17 DIAGNOSIS — E1165 Type 2 diabetes mellitus with hyperglycemia: Secondary | ICD-10-CM | POA: Diagnosis not present

## 2022-10-17 DIAGNOSIS — I469 Cardiac arrest, cause unspecified: Secondary | ICD-10-CM | POA: Diagnosis not present

## 2022-10-17 DIAGNOSIS — R601 Generalized edema: Secondary | ICD-10-CM | POA: Diagnosis not present

## 2022-10-17 LAB — GLUCOSE, CAPILLARY
Glucose-Capillary: 207 mg/dL — ABNORMAL HIGH (ref 70–99)
Glucose-Capillary: 216 mg/dL — ABNORMAL HIGH (ref 70–99)
Glucose-Capillary: 157 mg/dL — ABNORMAL HIGH (ref 70–99)
Glucose-Capillary: 206 mg/dL — ABNORMAL HIGH (ref 70–99)
Glucose-Capillary: 208 mg/dL — ABNORMAL HIGH (ref 70–99)

## 2022-10-17 LAB — CBC
HCT: 44.4 % (ref 39.0–52.0)
Hemoglobin: 13 g/dL (ref 13.0–17.0)
MCH: 26.4 pg (ref 26.0–34.0)
MCHC: 29.3 g/dL — ABNORMAL LOW (ref 30.0–36.0)
MCV: 90.2 fL (ref 80.0–100.0)
Platelets: 153 10*3/uL (ref 150–400)
RBC: 4.92 MIL/uL (ref 4.22–5.81)
RDW: 16.8 % — ABNORMAL HIGH (ref 11.5–15.5)
WBC: 9.6 10*3/uL (ref 4.0–10.5)
nRBC: 0 % (ref 0.0–0.2)

## 2022-10-17 LAB — BASIC METABOLIC PANEL
Anion gap: 13 (ref 5–15)
BUN: 37 mg/dL — ABNORMAL HIGH (ref 6–20)
CO2: 29 mmol/L (ref 22–32)
Calcium: 7.7 mg/dL — ABNORMAL LOW (ref 8.9–10.3)
Chloride: 102 mmol/L (ref 98–111)
Creatinine, Ser: 1.17 mg/dL (ref 0.61–1.24)
GFR, Estimated: >60 mL/min (ref >60)
Glucose, Bld: 206 mg/dL — ABNORMAL HIGH (ref 70–99)
Potassium: 3.6 mmol/L (ref 3.5–5.1)
Sodium: 144 mmol/L (ref 135–145)

## 2022-10-17 LAB — HEPARIN LEVEL (UNFRACTIONATED)
Heparin Unfractionated: 0.51 [IU]/mL (ref 0.30–0.70)
Heparin Unfractionated: 0.42 [IU]/mL (ref 0.30–0.70)

## 2022-10-17 LAB — MAGNESIUM: Magnesium: 2.4 mg/dL (ref 1.7–2.4)

## 2022-10-17 LAB — PHOSPHORUS: Phosphorus: 3.4 mg/dL (ref 2.5–4.6)

## 2022-10-17 MED ORDER — POTASSIUM CHLORIDE 20 MEQ PO PACK
40.0000 meq | PACK | Freq: Two times a day (BID) | ORAL | Status: AC
  Administered 2022-10-17 (×2): 40 meq
  Filled 2022-10-17 (×2): qty 2

## 2022-10-17 MED ORDER — POTASSIUM CHLORIDE 20 MEQ PO PACK: 40.0000 meq | PACK | Freq: Once | ORAL | Status: DC

## 2022-10-17 MED ORDER — HEPARIN BOLUS VIA INFUSION
4000.0000 [IU] | Freq: Once | INTRAVENOUS | Status: AC
  Administered 2022-10-17: 4000 [IU] via INTRAVENOUS
  Filled 2022-10-17: qty 4000

## 2022-10-17 MED ORDER — FUROSEMIDE 10 MG/ML IJ SOLN
80.0000 mg | Freq: Once | INTRAMUSCULAR | Status: AC
  Administered 2022-10-17: 80 mg via INTRAVENOUS
  Filled 2022-10-17: qty 8

## 2022-10-17 MED ORDER — INSULIN ASPART 100 UNIT/ML IJ SOLN
4.0000 [IU] | INTRAMUSCULAR | Status: DC
  Administered 2022-10-17 – 2022-10-21 (×24): 4 [IU] via SUBCUTANEOUS

## 2022-10-17 MED ORDER — AMIODARONE IV BOLUS ONLY 150 MG/100ML
INTRAVENOUS | Status: AC
  Filled 2022-10-17: qty 100

## 2022-10-17 MED ORDER — PROPOFOL 1000 MG/100ML IV EMUL
5.0000 ug/kg/min | INTRAVENOUS | Status: DC
  Administered 2022-10-17: 5 ug/kg/min via INTRAVENOUS
  Administered 2022-10-17 (×2): 20 ug/kg/min via INTRAVENOUS
  Administered 2022-10-18 (×3): 40 ug/kg/min via INTRAVENOUS
  Filled 2022-10-17: qty 200
  Filled 2022-10-17 (×3): qty 100

## 2022-10-17 MED ORDER — AMIODARONE LOAD VIA INFUSION
150.0000 mg | Freq: Once | INTRAVENOUS | Status: AC
  Administered 2022-10-17: 150 mg via INTRAVENOUS
  Filled 2022-10-17: qty 83.34

## 2022-10-17 NOTE — Progress Notes (Signed)
PHARMACY - ANTICOAGULATION CONSULT NOTE  Pharmacy Consult for Heparin Indication: atrial fibrillation  No Known Allergies  Patient Measurements: Height: 5\' 8"  (172.7 cm) Weight: (!) 208.3 kg (459 lb 3.5 oz) IBW/kg (Calculated) : 68.4 Heparin Dosing Weight: 123 kg  Vital Signs: Temp: 100.1 F (37.8 C) (10/07 0640) Temp Source: Axillary (10/07 0640) BP: 117/74 (10/07 0900) Pulse Rate: 46 (10/07 0900)  Labs: Recent Labs     0000 10/14/22 1111 10/14/22 1403 10/14/22 1635 10/15/22 0312 10/15/22 1005 10/15/22 1656 10/15/22 2037 10/16/22 0500 10/16/22 0600 10/16/22 1524 10/16/22 2305 10/17/22 0544 10/17/22 0838  HGB   < >  --   --   --  14.9  --   --   --  14.5  --   --   --  13.0  --   HCT  --   --   --   --  48.9  --   --   --  49.1  --   --   --  44.4  --   PLT  --   --   --   --  236  --   --   --  208  --   --   --  153  --   HEPARINUNFRC  --   --   --   --  0.44   < >  --    < >  --    < > 0.35 0.19*  --  0.51  CREATININE   < >  --   --   --  1.49*  --  1.60*  --  1.31*  --   --   --  1.17  --   TROPONINIHS  --  755* 908* 774*  --   --   --   --   --   --   --   --   --   --    < > = values in this interval not displayed.    Estimated Creatinine Clearance: 134.4 mL/min (by C-G formula based on SCr of 1.17 mg/dL).   Medical History: Past Medical History:  Diagnosis Date   AKI (acute kidney injury) (HCC) 07/27/2021   Bipolar 1 disorder, depressed (HCC)    CAP (community acquired pneumonia) 07/14/2021   COPD (chronic obstructive pulmonary disease) (HCC)    Depression with anxiety    Diabetes mellitus type II, non insulin dependent (HCC) 11/2020   A1c 7.3   Fall at home, initial encounter 05/28/2020   Last Assessment & Plan:  Formatting of this note might be different from the original. Acute onset, occurred few days ago.  Reports having a mechanical fall, fell forward injuring his left side including arm, knee.  Denies any vision changes, head trauma or loss of  consciousness.  Patient is being referred to the emergency room for lower GI bleed, can also get evaluated for his injuries.   Gastric ulcer    when on NSAIDs   GERD (gastroesophageal reflux disease)    Helicobacter pylori antibody positive 07/04/2018   Formatting of this note might be different from the original. Started triple therapy 06/30/18.   Needs HP stool antigen in 8 weeks,  Off PPI for 10 days.   HTN (hypertension)    OSA (obstructive sleep apnea)    not currently on CPAP   Rheumatoid arthritis (HCC)    Right kidney mass, 2.8 cm 11/2020   Tobacco abuse      Assessment: AC/Heme:  - IV  heparin for new onset afib with RVR. CHADS2VASC at least 2. - No prior to admission anticoagulant meds  Today, 10/17/22 - 08:38 heparin level therapeutic at 0.51 with IV heparin infusing at 2800 units/hr - No bleeding and no heparin infusion related issues per nurse  Goal of Therapy:  Heparin level 0.3-0.7 units/ml Monitor platelets by anticoagulation protocol: Yes   Plan:  Continue IV heparin infusion at 2800 units/hr Check confirmatory heparin level in 8 hrs Monitor daily heparin level, CBC, signs/symptoms of bleeding   Thank you for allowing pharmacy to be a part of this patient's care.  Selinda Eon, PharmD, BCPS Clinical Pharmacist [place] 10/17/2022 9:21 AM

## 2022-10-17 NOTE — Inpatient Diabetes Management (Signed)
Inpatient Diabetes Program Recommendations  AACE/ADA: New Consensus Statement on Inpatient Glycemic Control (2015)  Target Ranges:  Prepandial:   less than 140 mg/dL      Peak postprandial:   less than 180 mg/dL (1-2 hours)      Critically ill patients:  140 - 180 mg/dL   Lab Results  Component Value Date   GLUCAP 157 (H) 10/17/2022   HGBA1C 9.7 (H) 10/14/2022    Review of Glycemic Control  Latest Reference Range & Units 10/16/22 08:14 10/16/22 11:47 10/16/22 16:37 10/16/22 20:08 10/16/22 23:28 10/17/22 03:35 10/17/22 08:35 10/17/22 11:25  Glucose-Capillary 70 - 99 mg/dL 578 (H) 469 (H) 629 (H) 174 (H) 198 (H) 207 (H) 216 (H) 157 (H)   Diabetes history: DM 2 Outpatient Diabetes medications: 70/30 45 units bid, Jardiance 10 mg Daily, Metformin 1000 mg bid Current orders for Inpatient glycemic control:  Lantus 20 units bid Novolog 0-15 units Q4 hours  Pivot 50 ml/hour  Inpatient Diabetes Program Recommendations:    -   Add Novolog 4 units Q4 hours Tube Feed coverage  Thanks,  Christena Deem RN, MSN, BC-ADM Inpatient Diabetes Coordinator Team Pager [phone] (8a-5p)

## 2022-10-17 NOTE — Progress Notes (Signed)
Pharmacy Antibiotic Note  Raymond Hood is a 52 y.o. male admitted on Nov 01, 2022 with fall.  On oral doxycycline for right lower extremity cellulitis  On 10/4, Pharmacy consulted to dose Unasyn for aspiration pna.  Plan: Continue Unasyn 3 gm IV q6h May enter 7 day total duration stop date per provider Pharmacy will sign off consult and continue to monitor clinical progress and renal function for any dose adjustment per protocol.  Height: 5\' 8"  (172.7 cm) Weight: (!) 208.3 kg (459 lb 3.5 oz) IBW/kg (Calculated) : 68.4  Temp (24hrs), Avg:99.7 F (37.6 C), Min:98.7 F (37.1 C), Max:100.3 F (37.9 C)  Recent Labs  Lab 10/14/22 0419 10/14/22 0728 10/14/22 1111 10/15/22 0312 10/15/22 1656 10/16/22 0500 10/17/22 0544  WBC 16.4* 15.9*  --  12.6*  --  12.2* 9.6  CREATININE 1.40* 1.47*  --  1.49* 1.60* 1.31* 1.17  LATICACIDVEN  --  1.8 1.9  --   --   --   --     Estimated Creatinine Clearance: 134.4 mL/min (by C-G formula based on SCr of 1.17 mg/dL).    No Known Allergies   Thank you for allowing pharmacy to be a part of this patient's care.  Thank you for allowing pharmacy to be a part of this patient's care.  Selinda Eon, PharmD, BCPS Clinical Pharmacist [place] 10/17/2022 9:32 AM

## 2022-10-17 NOTE — Progress Notes (Signed)
PHARMACY - ANTICOAGULATION CONSULT NOTE  Pharmacy Consult for Heparin Indication: atrial fibrillation  No Known Allergies  Patient Measurements: Height: 5\' 8"  (172.7 cm) Weight: (!) 208.3 kg (459 lb 3.5 oz) IBW/kg (Calculated) : 68.4 Heparin Dosing Weight: 123 kg  Vital Signs: Temp: 99.4 F (37.4 C) (10/07 1100) Temp Source: Axillary (10/07 1100) BP: 95/55 (10/07 1400) Pulse Rate: 46 (10/07 0900)  Labs: Recent Labs    10/15/22 0312 10/15/22 1005 10/15/22 1656 10/15/22 2037 10/16/22 0500 10/16/22 0600 10/16/22 2305 10/17/22 0544 10/17/22 0838 10/17/22 1712  HGB 14.9  --   --   --  14.5  --   --  13.0  --   --   HCT 48.9  --   --   --  49.1  --   --  44.4  --   --   PLT 236  --   --   --  208  --   --  153  --   --   HEPARINUNFRC 0.44   < >  --    < >  --    < > 0.19*  --  0.51 0.42  CREATININE 1.49*  --  1.60*  --  1.31*  --   --  1.17  --   --    < > = values in this interval not displayed.    Estimated Creatinine Clearance: 134.4 mL/min (by C-G formula based on SCr of 1.17 mg/dL).   Medical History: Past Medical History:  Diagnosis Date   AKI (acute kidney injury) (HCC) 07/27/2021   Bipolar 1 disorder, depressed (HCC)    CAP (community acquired pneumonia) 07/14/2021   COPD (chronic obstructive pulmonary disease) (HCC)    Depression with anxiety    Diabetes mellitus type II, non insulin dependent (HCC) 11/2020   A1c 7.3   Fall at home, initial encounter 05/28/2020   Last Assessment & Plan:  Formatting of this note might be different from the original. Acute onset, occurred few days ago.  Reports having a mechanical fall, fell forward injuring his left side including arm, knee.  Denies any vision changes, head trauma or loss of consciousness.  Patient is being referred to the emergency room for lower GI bleed, can also get evaluated for his injuries.   Gastric ulcer    when on NSAIDs   GERD (gastroesophageal reflux disease)    Helicobacter pylori antibody  positive 07/04/2018   Formatting of this note might be different from the original. Started triple therapy 06/30/18.   Needs HP stool antigen in 8 weeks,  Off PPI for 10 days.   HTN (hypertension)    OSA (obstructive sleep apnea)    not currently on CPAP   Rheumatoid arthritis (HCC)    Right kidney mass, 2.8 cm 11/2020   Tobacco abuse      Assessment: AC/Heme:  - IV heparin for new onset afib with RVR. CHADS2VASC at least 2. - No prior to admission anticoagulant meds  Today, 10/17/22 -Heparin level therapeutic at 0.42 with heparin infusing at 2800 units/hr -No complications of therapy noted  Goal of Therapy:  Heparin level 0.3-0.7 units/ml Monitor platelets by anticoagulation protocol: Yes   Plan:  -Continue heparin infusion at 2800 units/hr -Monitor daily heparin level, CBC, signs/symptoms of bleeding   Thank you for allowing pharmacy to be a part of this patient's care.  Pricilla Riffle, PharmD, BCPS Clinical Pharmacist 10/17/2022 6:48 PM

## 2022-10-17 NOTE — Progress Notes (Signed)
NAME:  Raymond Hood, MRN:  [id number], DOB:  July 27, 1970, LOS: 4 ADMISSION DATE:  2022-10-20, CONSULTATION DATE:  10/14/2022 REFERRING MD:  Dr. Antionette Char - TRh, CHIEF COMPLAINT:  Cardiac arrest    History of Present Illness:  Raymond Hood is a 52 year old male with a past medical history significant for COPD, OSA/OHS, chronic HFpEF, type 2 diabetes, obesity, and schizoaffective disorder who presented to the ED at [facility] 10/3 after suffering mechanical fall resulting in bilateral leg and lower back pain.  Post fall patient was able to bear weight on lower extremities and ambulate but reports lower back and leg pain.  On ED arrival patient was seen hypoxic, tachypneic, and slightly tachycardic but hemodynamically stable.  CT chest/abdomen/pelvis notable for diffuse body wall edema and small right pleural effusion.  Patient was admitted per hospitalist for decompensated HFpEF and hypoxic respiratory failure.  Overnight/early a.m. 10/4 patient's suffered in-hospital cardiac arrest.  Per report patient became hypoxic and bradycardic with transition to PEA cardiac arrest with approximate 15 to 20 minutes of downtime.  PCCM consulted for further assistance in management.  Pertinent  Medical History  COPD, OSA/OHS, chronic HFpEF, type 2 diabetes, obesity, and schizoaffective disorder   Significant Hospital Events: Including procedures, antibiotic start and stop dates in addition to other pertinent events   10/3 presented after mechanical fall with a chief complaint of back pain and bilateral leg pain admitted for decompensated HFpEF and hypoxic respiratory failure 10/4 and hospital PEA cardiac arrest, approximate 20-minute downtime  Interim History / Subjective:  Remains off pressors Making good urine Neuro exam remains poor Still in A fib   Objective:  Blood pressure (!) 149/87, pulse 63, temperature 100.1 F (37.8 C), temperature source Axillary, resp. rate (!) 28, height 5\' 8"  (1.727 m), weight  (!) 208.3 kg, SpO2 97%.    Vent Mode: PRVC FiO2 (%):  [50 %-60 %] 50 % Set Rate:  [28 bmp] 28 bmp Vt Set:  [540 mL] 540 mL PEEP:  [8 cmH20] 8 cmH20 Plateau Pressure:  [23 cmH20-29 cmH20] 25 cmH20   Intake/Output Summary (Last 24 hours) at 10/17/2022 0734 Last data filed at 10/17/2022 0700 Gross per 24 hour  Intake 2467.09 ml  Output 3800 ml  Net -1332.91 ml   Filed Weights   10/16/22 0405 10/16/22 2345 10/17/22 0400  Weight: (!) 209.9 kg (!) 209.6 kg (!) 208.3 kg   Physical Examination: General:Critically ill middle aged male in NAD on vent.  HEENT: [place]/AT, PERRL, no JVD. ETT Neuro: Cough/gag + , breathing over the vent. Not moving extremities.  CV: IRIR, tachy 140s. No MRG PULM: Rhonchi clear with suctioning. Thick secretions.  GI: Soft, normoactive Extremities: No acute deformity. Bilateral lower extremity edema.   Skin: Bilateral lower extremity erythema and superficial skin tears.   Resolved Hospital Problem list     Assessment & Plan:  Acute hypoxemic respiratory failure: Presumed CHF exacerbation, pulmonary edema.  Possible aspiration with code event. -- IV diuresis, good urine output and Cr improving with Lasix 80 mg IV last 2 days. Will repeat.  -- PRVC, VAP bundle, stress ulcer prophylaxis, PAD protocol -- Continue nebulized bronchodilators  -- Unasyn day 4/7   PEA arrest with resultant metabolic encephalopathy: In setting of hypoxemia and refusal of NIPPV.  25 minutes resuscitation.  Neurologic status remains very poor but opening eyes a little bit, may be slightly better but overall no significant improvement. EEG negative for seizure.  -- Minimize sedating agents for ongoing neuro prognostication -- Is  requiring PRN sedation for agitation/vent comfort -- So far no indications of meaningful recovery   Hypotension/shock: After PEA arrest. Vasopressors now weaned off. -- Continue diuresis given hypervolemia -- Weaned off norepinephrine   A-fib with RVR: New  onset.  Likely in the setting of arrest and cardiac abnormalities -- IV amiodarone -- Oral metoprolol 12. 5 mg 4 times daily started 10/6 -- heparin gtt -- Remains in AF RVR, will reassess once more comfortable. Agitation/pain seems to be at least somewhat contributing.  AKI: Suspect hypervolemic, stable -- Continue diuresis -- Trend BMP   Right lower extremity cellulitis versus chronic venous changes -- Continue doxycycline Day 4/7  Best Practice (right click and "Reselect all SmartList Selections" daily)   Diet/type: Tube feeds DVT prophylaxis: LMWH GI prophylaxis: PPI Lines: N/A Foley:  N/A Code Status:  full code Last date of multidisciplinary goals of care discussion: Discussed with friend only known contact 10/5, grim prognosis, allowing some time to see if there are recovery  Critical care time: 46 minutes    Joneen Roach, AGACNP-BC [facility]  See Amion for personal pager PCCM on call pager [phone] until 7pm. Please call Elink 7p-7a. [phone]  10/17/2022 8:05 AM

## 2022-10-17 NOTE — Progress Notes (Signed)
PHARMACY - ANTICOAGULATION CONSULT NOTE  Pharmacy Consult for Heparin Indication: atrial fibrillation  No Known Allergies  Patient Measurements: Height: 5\' 8"  (172.7 cm) Weight: (!) 209.6 kg (462 lb 1.4 oz) IBW/kg (Calculated) : 68.4 Heparin Dosing Weight: 123 kg  Vital Signs: Temp: 99.9 F (37.7 C) (10/06 2307) Temp Source: Axillary (10/06 2307) BP: 115/68 (10/06 2332) Pulse Rate: 139 (10/06 2332)  Labs: Recent Labs    10/14/22 0728 10/14/22 1111 10/14/22 1403 10/14/22 1635 10/15/22 0312 10/15/22 1005 10/15/22 1656 10/15/22 2037 10/16/22 0500 10/16/22 0600 10/16/22 1524 10/16/22 2305  HGB 15.8  --   --   --  14.9  --   --   --  14.5  --   --   --   HCT 53.4*  --   --   --  48.9  --   --   --  49.1  --   --   --   PLT 335  --   --   --  236  --   --   --  208  --   --   --   HEPARINUNFRC  --   --   --   --  0.44   < >  --    < >  --  0.25* 0.35 0.19*  CREATININE 1.47*  --   --   --  1.49*  --  1.60*  --  1.31*  --   --   --   TROPONINIHS 224* 755* 908* 774*  --   --   --   --   --   --   --   --    < > = values in this interval not displayed.    Estimated Creatinine Clearance: 120.5 mL/min (A) (by C-G formula based on SCr of 1.31 mg/dL (H)).   Medical History: Past Medical History:  Diagnosis Date   AKI (acute kidney injury) (HCC) 07/27/2021   Bipolar 1 disorder, depressed (HCC)    CAP (community acquired pneumonia) 07/14/2021   COPD (chronic obstructive pulmonary disease) (HCC)    Depression with anxiety    Diabetes mellitus type II, non insulin dependent (HCC) 11/2020   A1c 7.3   Fall at home, initial encounter 05/28/2020   Last Assessment & Plan:  Formatting of this note might be different from the original. Acute onset, occurred few days ago.  Reports having a mechanical fall, fell forward injuring his left side including arm, knee.  Denies any vision changes, head trauma or loss of consciousness.  Patient is being referred to the emergency room for lower  GI bleed, can also get evaluated for his injuries.   Gastric ulcer    when on NSAIDs   GERD (gastroesophageal reflux disease)    Helicobacter pylori antibody positive 07/04/2018   Formatting of this note might be different from the original. Started triple therapy 06/30/18.   Needs HP stool antigen in 8 weeks,  Off PPI for 10 days.   HTN (hypertension)    OSA (obstructive sleep apnea)    not currently on CPAP   Rheumatoid arthritis (HCC)    Right kidney mass, 2.8 cm 11/2020   Tobacco abuse      Assessment: AC/Heme:  - IV heparin for afib. CHADS2VASC at least 2. - Hep level 0.19 remains low on 2400 units/hr - no bleeding noted -  Goal of Therapy:  Heparin level 0.3-0.7 units/ml Monitor platelets by anticoagulation protocol: Yes   Plan:  Heparin 4000 unit  IV bolus Increase infusion to 2800 units/hr Check heparin level in 8 hrs Daily HL and CBC   Arley Phenix RPh 10/17/2022, 12:05 AM

## 2022-10-17 NOTE — Plan of Care (Signed)
  Problem: Activity: Goal: Capacity to carry out activities will improve Outcome: Not Progressing   Problem: Cardiac: Goal: Ability to achieve and maintain adequate cardiopulmonary perfusion will improve Outcome: Progressing   Problem: Education: Goal: Ability to describe self-care measures that may prevent or decrease complications (Diabetes Survival Skills Education) will improve Outcome: Not Progressing   Problem: Coping: Goal: Ability to adjust to condition or change in health will improve Outcome: Not Progressing

## 2022-10-18 ENCOUNTER — Inpatient Hospital Stay (HOSPITAL_COMMUNITY): Payer: MEDICAID

## 2022-10-18 DIAGNOSIS — G931 Anoxic brain damage, not elsewhere classified: Secondary | ICD-10-CM | POA: Diagnosis not present

## 2022-10-18 DIAGNOSIS — I469 Cardiac arrest, cause unspecified: Secondary | ICD-10-CM | POA: Diagnosis not present

## 2022-10-18 DIAGNOSIS — J4489 Other specified chronic obstructive pulmonary disease: Secondary | ICD-10-CM | POA: Diagnosis not present

## 2022-10-18 DIAGNOSIS — J9601 Acute respiratory failure with hypoxia: Secondary | ICD-10-CM | POA: Diagnosis not present

## 2022-10-18 DIAGNOSIS — R601 Generalized edema: Secondary | ICD-10-CM | POA: Diagnosis not present

## 2022-10-18 LAB — CBC
HCT: 46.1 % (ref 39.0–52.0)
Hemoglobin: 13.3 g/dL (ref 13.0–17.0)
MCH: 26.4 pg (ref 26.0–34.0)
MCHC: 28.9 g/dL — ABNORMAL LOW (ref 30.0–36.0)
MCV: 91.5 fL (ref 80.0–100.0)
Platelets: 161 10*3/uL (ref 150–400)
RBC: 5.04 MIL/uL (ref 4.22–5.81)
RDW: 17 % — ABNORMAL HIGH (ref 11.5–15.5)
WBC: 9.3 10*3/uL (ref 4.0–10.5)
nRBC: 0 % (ref 0.0–0.2)

## 2022-10-18 LAB — GLUCOSE, CAPILLARY
Glucose-Capillary: 195 mg/dL — ABNORMAL HIGH (ref 70–99)
Glucose-Capillary: 195 mg/dL — ABNORMAL HIGH (ref 70–99)
Glucose-Capillary: 205 mg/dL — ABNORMAL HIGH (ref 70–99)
Glucose-Capillary: 212 mg/dL — ABNORMAL HIGH (ref 70–99)
Glucose-Capillary: 217 mg/dL — ABNORMAL HIGH (ref 70–99)
Glucose-Capillary: 245 mg/dL — ABNORMAL HIGH (ref 70–99)
Glucose-Capillary: 265 mg/dL — ABNORMAL HIGH (ref 70–99)

## 2022-10-18 LAB — BASIC METABOLIC PANEL
Anion gap: 8 (ref 5–15)
BUN: 45 mg/dL — ABNORMAL HIGH (ref 6–20)
CO2: 27 mmol/L (ref 22–32)
Calcium: 6.8 mg/dL — ABNORMAL LOW (ref 8.9–10.3)
Chloride: 110 mmol/L (ref 98–111)
Creatinine, Ser: 1.26 mg/dL — ABNORMAL HIGH (ref 0.61–1.24)
GFR, Estimated: >60 mL/min (ref >60)
Glucose, Bld: 217 mg/dL — ABNORMAL HIGH (ref 70–99)
Potassium: 3.6 mmol/L (ref 3.5–5.1)
Sodium: 145 mmol/L (ref 135–145)

## 2022-10-18 LAB — BLOOD GAS, ARTERIAL
Acid-Base Excess: 7.8 mmol/L — ABNORMAL HIGH (ref 0.0–2.0)
Bicarbonate: 32 mmol/L — ABNORMAL HIGH (ref 20.0–28.0)
FIO2: 40 %
O2 Content: 94 L/min
O2 Saturation: 96.7 %
PEEP: 8 cmH2O
Patient temperature: 38.9
RATE: 28 {breaths}/min
pCO2 arterial: 46 mm[Hg] (ref 32–48)
pH, Arterial: 7.46 — ABNORMAL HIGH (ref 7.35–7.45)
pO2, Arterial: 80 mm[Hg] — ABNORMAL LOW (ref 83–108)

## 2022-10-18 LAB — PHOSPHORUS: Phosphorus: 4.2 mg/dL (ref 2.5–4.6)

## 2022-10-18 LAB — MAGNESIUM: Magnesium: 2.4 mg/dL (ref 1.7–2.4)

## 2022-10-18 LAB — HEPARIN LEVEL (UNFRACTIONATED): Heparin Unfractionated: 0.42 [IU]/mL (ref 0.30–0.70)

## 2022-10-18 LAB — TRIGLYCERIDES: Triglycerides: 188 mg/dL — ABNORMAL HIGH (ref <150)

## 2022-10-18 MED ORDER — METOPROLOL TARTRATE 25 MG PO TABS
25.0000 mg | ORAL_TABLET | Freq: Four times a day (QID) | ORAL | Status: DC
  Administered 2022-10-18 – 2022-10-19 (×3): 25 mg
  Filled 2022-10-18 (×3): qty 1

## 2022-10-18 MED ORDER — IBUPROFEN 100 MG/5ML PO SUSP
600.0000 mg | Freq: Once | ORAL | Status: AC
  Administered 2022-10-18: 600 mg
  Filled 2022-10-18: qty 30

## 2022-10-18 MED ORDER — CALCIUM GLUCONATE-NACL 1-0.675 GM/50ML-% IV SOLN
1.0000 g | Freq: Once | INTRAVENOUS | Status: AC
  Administered 2022-10-18: 1000 mg via INTRAVENOUS
  Filled 2022-10-18: qty 50

## 2022-10-18 MED ORDER — POTASSIUM CHLORIDE 20 MEQ PO PACK
40.0000 meq | PACK | Freq: Once | ORAL | Status: AC
  Administered 2022-10-18: 40 meq
  Filled 2022-10-18: qty 2

## 2022-10-18 NOTE — Progress Notes (Signed)
[facility] ADULT ICU REPLACEMENT PROTOCOL   The patient does apply for the [facility] Adult ICU Electrolyte Replacment Protocol based on the criteria listed below:   1.Exclusion criteria: TCTS, ECMO, Dialysis, and Myasthenia Gravis patients 2. Is GFR >/= 30 ml/min? Yes.    Patient's GFR today is >60 3. Is SCr </= 2? Yes.   Patient's SCr is 1.26 mg/dL 4. Did SCr increase >/= 0.5 in 24 hours? No. 5.[name] weight >40kg  Yes.   6. Abnormal electrolyte(s): K+ = 3.6  7. Electrolytes replaced per protocol 8.  Call MD STAT for K+ </= 2.5, Phos </= 1, or Mag </= 1 Physician:  eMD, Rosezella Florida 10/18/2022 6:12 AM

## 2022-10-18 NOTE — Progress Notes (Signed)
Nutrition Follow-up  DOCUMENTATION CODES:   Morbid obesity  INTERVENTION:  - Continue goal TF: Pivot 1.5 at 50 ml/h (120 ml per day) Prosource TF20 60 ml TID Provides 2040 kcal, 172 gm protein, 900 ml free water daily   - FWF per CCM/MD.    NUTRITION DIAGNOSIS:   Inadequate oral intake related to inability to eat as evidenced by NPO status. *ongoing  GOAL:   Patient will meet greater than or equal to 90% of their needs *met with TF  MONITOR:   Vent status, Labs, Weight trends  REASON FOR ASSESSMENT:   Ventilator    ASSESSMENT:   52 y.o. male with PMH significant for COPD, OSA/OHS, chronic HFpEF, DM2, schizoaffective disorder, and BMI 56 who presented with bilateral leg and low back pain after a slip and fall at home.  10/3: Admit 10/4: suffered a cardiac arrest; intubated 10/5: Pivot started at 84ml/hr 10/7: Reached goal TF of 63mL/hr  Patient remains intubated. Remains off pressors. Continues on goal TF. Per RN, patient tolerating well.  CCM notes indicate patient with no meaningful neurological recovery. CCM to discuss GOC with family.    Admit weight: 167.4kg (likely carried over from previous weight) -> 213.3kg Current weight: 206.7kg  Medications reviewed and include: Colace, Miralax, Insulin  Labs reviewed:  Creatinine 1.26   Diet Order:   Diet Order     None       EDUCATION NEEDS:  Not appropriate for education at this time  Skin:  Skin Assessment: Reviewed RN Assessment  Last BM:  10/3  Height:  Ht Readings from Last 1 Encounters:  10/16/22 5\' 8"  (1.727 m)   Weight:  Wt Readings from Last 1 Encounters:  10/18/22 (!) 206.7 kg   Ideal Body Weight:  70 kg  BMI:  Body mass index is 69.29 kg/m.  Estimated Nutritional Needs:  Kcal:  1850-2350 kcals Protein:  140-175 grams Fluid:  >/= 1.9L    Shelle Iron RD, LDN For contact information, refer to [facility].

## 2022-10-18 NOTE — Progress Notes (Signed)
PHARMACY - ANTICOAGULATION CONSULT NOTE  Pharmacy Consult for Heparin Indication: atrial fibrillation  No Known Allergies  Patient Measurements: Height: 5\' 8"  (172.7 cm) Weight: (!) 208.3 kg (459 lb 3.5 oz) IBW/kg (Calculated) : 68.4 Heparin Dosing Weight: 123 kg  Vital Signs: Temp: 100.7 F (38.2 C) (10/08 0415) Temp Source: Axillary (10/08 0415) BP: 101/67 (10/08 0651) Pulse Rate: 138 (10/08 0651)  Labs: Recent Labs    10/16/22 0500 10/16/22 0600 10/17/22 0544 10/17/22 0838 10/17/22 1712 10/18/22 0447  HGB 14.5  --  13.0  --   --  13.3  HCT 49.1  --  44.4  --   --  46.1  PLT 208  --  153  --   --  161  HEPARINUNFRC  --    < >  --  0.51 0.42 0.42  CREATININE 1.31*  --  1.17  --   --  1.26*   < > = values in this interval not displayed.    Estimated Creatinine Clearance: 124.8 mL/min (A) (by C-G formula based on SCr of 1.26 mg/dL (H)).   Medical History: Past Medical History:  Diagnosis Date   AKI (acute kidney injury) (HCC) 07/27/2021   Bipolar 1 disorder, depressed (HCC)    CAP (community acquired pneumonia) 07/14/2021   COPD (chronic obstructive pulmonary disease) (HCC)    Depression with anxiety    Diabetes mellitus type II, non insulin dependent (HCC) 11/2020   A1c 7.3   Fall at home, initial encounter 05/28/2020   Last Assessment & Plan:  Formatting of this note might be different from the original. Acute onset, occurred few days ago.  Reports having a mechanical fall, fell forward injuring his left side including arm, knee.  Denies any vision changes, head trauma or loss of consciousness.  Patient is being referred to the emergency room for lower GI bleed, can also get evaluated for his injuries.   Gastric ulcer    when on NSAIDs   GERD (gastroesophageal reflux disease)    Helicobacter pylori antibody positive 07/04/2018   Formatting of this note might be different from the original. Started triple therapy 06/30/18.   Needs HP stool antigen in 8 weeks,   Off PPI for 10 days.   HTN (hypertension)    OSA (obstructive sleep apnea)    not currently on CPAP   Rheumatoid arthritis (HCC)    Right kidney mass, 2.8 cm 11/2020   Tobacco abuse      Assessment: AC/Heme:  - IV heparin for new onset afib with RVR. CHADS2VASC at least 2. - No prior to admission anticoagulant meds  Today, 10/18/22 -04:47 Heparin level therapeutic at 0.42 with heparin infusing at 2800 units/hr -No bleeding or heparin related infusion issues per nurse  Goal of Therapy:  Heparin level 0.3-0.7 units/ml Monitor platelets by anticoagulation protocol: Yes   Plan:  -Continue heparin infusion at 2800 units/hr -Monitor daily heparin level, CBC, signs/symptoms of bleeding   Thank you for allowing pharmacy to be a part of this patient's care.  Lynden Ang, PharmD, BCPS Clinical Pharmacist 10/18/2022 7:20 AM

## 2022-10-18 NOTE — Progress Notes (Signed)
NAME:  Raymond Hood, MRN:  [id number], DOB:  1970/05/12, LOS: 5 ADMISSION DATE:  11/03/2022, CONSULTATION DATE:  10/14/2022 REFERRING MD:  Dr. Antionette Char - TRh, CHIEF COMPLAINT:  Cardiac arrest    History of Present Illness:  Raymond Hood is a 52 year old male with a past medical history significant for COPD, OSA/OHS, chronic HFpEF, type 2 diabetes, obesity, and schizoaffective disorder who presented to the ED at [facility] 10/3 after suffering mechanical fall resulting in bilateral leg and lower back pain.  Post fall patient was able to bear weight on lower extremities and ambulate but reports lower back and leg pain.  On ED arrival patient was seen hypoxic, tachypneic, and slightly tachycardic but hemodynamically stable.  CT chest/abdomen/pelvis notable for diffuse body wall edema and small right pleural effusion.  Patient was admitted per hospitalist for decompensated HFpEF and hypoxic respiratory failure.  Overnight/early a.m. 10/4 patient's suffered in-hospital cardiac arrest.  Per report patient became hypoxic and bradycardic with transition to PEA cardiac arrest with approximate 15 to 20 minutes of downtime.  PCCM consulted for further assistance in management.  Pertinent  Medical History  COPD, OSA/OHS, chronic HFpEF, type 2 diabetes, obesity, and schizoaffective disorder   Significant Hospital Events: Including procedures, antibiotic start and stop dates in addition to other pertinent events   10/3 presented after mechanical fall with a chief complaint of back pain and bilateral leg pain admitted for decompensated HFpEF and hypoxic respiratory failure 10/4 and hospital PEA cardiac arrest, approximate 20-minute downtime 10/4 EEG without sz 10/5 echo LVEF 35-40%. Global HK. Moderate reduction of RV sys function. Moderately enlarged. 10/7 no meaningful neurologic recovery  Interim History / Subjective:  Remains tachy despite amio, BB Fever overnight treated with ibuprofen No  leukocytosis Good urine output.  Sedated on propofol  Objective:  Blood pressure 101/67, pulse (!) 138, temperature (!) 100.7 F (38.2 C), temperature source Axillary, resp. rate (!) 30, height 5\' 8"  (1.727 m), weight (!) 208.3 kg, SpO2 99%.    Vent Mode: PRVC FiO2 (%):  [50 %] 50 % Set Rate:  [28 bmp] 28 bmp Vt Set:  [540 mL] 540 mL PEEP:  [8 cmH20-838 cmH20] 838 cmH20 Plateau Pressure:  [24 cmH20-29 cmH20] 29 cmH20   Intake/Output Summary (Last 24 hours) at 10/18/2022 0709 Last data filed at 10/18/2022 0308 Gross per 24 hour  Intake 2471.04 ml  Output 4525 ml  Net -2053.96 ml   Filed Weights   10/16/22 0405 10/16/22 2345 10/17/22 0400  Weight: (!) 209.9 kg (!) 209.6 kg (!) 208.3 kg   Physical Examination: General: Critically ill middle aged male in NAD on vent HEENT: [place]/AT, PERRL, no JVD Neuro: Sedated on vent CV: IRIR tachy to 130s. No MRG PULM: Rhonchi L > R GI: Soft, non-tender, hyperactive Extremities: lower extremity venous stasis changes.  Skin: Bilateral lower extremity erythema and superficial skin tears  Resolved Hospital Problem list     Assessment & Plan:  Acute hypoxemic respiratory failure: Presumed CHF exacerbation, pulmonary edema.  Possible aspiration with code event. -- IV diuresis, renal indices bumping. Hold off lasix today.   -- PRVC, VAP bundle, stress ulcer prophylaxis, PAD protocol -- Nebs -- Unasyn day 5/7   PEA arrest with resultant metabolic encephalopathy: In setting of hypoxemia and refusal of NIPPV.  25 minutes resuscitation.  Neurologic status remains very poor but opening eyes a little bit, may be slightly better but overall no significant improvement. EEG negative for seizure.  -- Will plan for sedation pause this  morning for neuro exam.  -- Otherwise propofol for RASS goal 0 to -1.  -- So far no indications of meaningful recovery   Hypotension/shock: After PEA arrest. Vasopressors now weaned off. -- Continue diuresis given  hypervolemia -- Weaned off norepinephrine   A-fib with RVR: New onset.  Likely in the setting of arrest and cardiac abnormalities. Refractory to typical therapies.  -- IV amiodarone -- Oral metoprolol 12. 5 mg 4 times daily started 10/6. Hold for SBT < 100. Needing to be held at times unfortunately -- heparin infusion per pharmacy  AKI: Suspect hypervolemic, stable -- Diuretic on hold today -- Trend BMP   Right lower extremity cellulitis versus chronic venous changes -- Continue doxycycline Day 5/7  Goals of care: No signs so far of a meaningful recovery. Family aware. Will discuss further with them today.   Best Practice (right click and "Reselect all SmartList Selections" daily)   Diet/type: Tube feeds DVT prophylaxis: systemic heparin GI prophylaxis: PPI Lines: N/A Foley:  N/A Code Status:  full code Last date of multidisciplinary goals of care discussion: Friend updated daily (no family) and will plan to update again today.   Critical care time: 46 minutes    Joneen Roach, AGACNP-BC [facility]  See Amion for personal pager PCCM on call pager [phone] until 7pm. Please call Elink 7p-7a. [phone]  10/18/2022 7:09 AM

## 2022-10-18 NOTE — IPAL (Signed)
  Interdisciplinary Goals of Care Family Meeting   Date carried out: 10/18/2022  Location of the meeting: Bedside  Member's involved: Nurse Practitioner and Family Member or next of kin  Durable Power of Attorney or acting medical decision maker: Patient's friend Delaney Meigs and other long time friend are both present. The patient has no family as we are told.    Discussion: We discussed goals of care for Raymond Hood .  They understand his situation is dire and prognosis is poor following cardiac arrest secondary to what is almost certainly anoxic injury. They are not prepared to make any decisions regarding his care at this time. We agreed to revisit this conversation again on Friday with my colleagues and if there is no significant improvement we will likely transition to comfort care.   Code status:   Code Status: Full Code   Disposition: Continue current acute care  Time spent for the meeting: 22 minutes    Duayne Cal, NP  10/18/2022, 2:50 PM

## 2022-10-18 NOTE — Plan of Care (Signed)
  Problem: Nutrition: Goal: Adequate nutrition will be maintained Outcome: Progressing   Problem: Cardiac: Goal: Ability to achieve and maintain adequate cardiopulmonary perfusion will improve Outcome: Not Progressing   Problem: Skin Integrity: Goal: Risk for impaired skin integrity will decrease Outcome: Not Progressing   Problem: Clinical Measurements: Goal: Ability to maintain clinical measurements within normal limits will improve Outcome: Not Progressing Goal: Respiratory complications will improve Outcome: Not Progressing Goal: Cardiovascular complication will be avoided Outcome: Not Progressing

## 2022-10-18 NOTE — Progress Notes (Signed)
eLink Physician-Brief Progress Note Patient Name: Raymond Hood DOB: 1970/10/21 MRN: [id number]   Date of Service  10/18/2022  HPI/Events of Note  52 year old with severe obesity chronic diastolic dysfunction with heart failure diabetes COPD presented with pain after fall found to be hypoxemic subsequent PEA arrest after refusal to wear NIPPV now on the ventilator with minimal neurologic recovery.   Persistent fever despite Tylenol.  Up to 102.8.  eICU Interventions  Add one-time ibuprofen.  Add cooling blanket.  Last cultures 10/4 with no growth to date, no other evidence of infection at this time, obtain blood cultures.  Chest radiograph.     Intervention Category Minor Interventions: Routine modifications to care plan (e.g. PRN medications for pain, fever)  Raymond Hood 10/18/2022, 2:10 AM

## 2022-10-19 DIAGNOSIS — J9601 Acute respiratory failure with hypoxia: Secondary | ICD-10-CM | POA: Diagnosis not present

## 2022-10-19 LAB — GLUCOSE, CAPILLARY
Glucose-Capillary: 204 mg/dL — ABNORMAL HIGH (ref 70–99)
Glucose-Capillary: 213 mg/dL — ABNORMAL HIGH (ref 70–99)
Glucose-Capillary: 213 mg/dL — ABNORMAL HIGH (ref 70–99)
Glucose-Capillary: 189 mg/dL — ABNORMAL HIGH (ref 70–99)
Glucose-Capillary: 219 mg/dL — ABNORMAL HIGH (ref 70–99)

## 2022-10-19 LAB — PHOSPHORUS: Phosphorus: 3.8 mg/dL (ref 2.5–4.6)

## 2022-10-19 LAB — COMPREHENSIVE METABOLIC PANEL
ALT: 17 U/L (ref 0–44)
AST: 27 U/L (ref 15–41)
Albumin: 2.7 g/dL — ABNORMAL LOW (ref 3.5–5.0)
Alkaline Phosphatase: 50 U/L (ref 38–126)
Anion gap: 7 (ref 5–15)
BUN: 43 mg/dL — ABNORMAL HIGH (ref 6–20)
CO2: 28 mmol/L (ref 22–32)
Calcium: 7.9 mg/dL — ABNORMAL LOW (ref 8.9–10.3)
Chloride: 109 mmol/L (ref 98–111)
Creatinine, Ser: 1.03 mg/dL (ref 0.61–1.24)
GFR, Estimated: >60 mL/min (ref >60)
Glucose, Bld: 205 mg/dL — ABNORMAL HIGH (ref 70–99)
Potassium: 4.2 mmol/L (ref 3.5–5.1)
Sodium: 144 mmol/L (ref 135–145)
Total Bilirubin: 1 mg/dL (ref 0.3–1.2)
Total Protein: 6.4 g/dL — ABNORMAL LOW (ref 6.5–8.1)

## 2022-10-19 LAB — CBC
HCT: 51.8 % (ref 39.0–52.0)
Hemoglobin: 14.8 g/dL (ref 13.0–17.0)
MCH: 26.3 pg (ref 26.0–34.0)
MCHC: 28.6 g/dL — ABNORMAL LOW (ref 30.0–36.0)
MCV: 92.2 fL (ref 80.0–100.0)
Platelets: 153 10*3/uL (ref 150–400)
RBC: 5.62 MIL/uL (ref 4.22–5.81)
RDW: 17.2 % — ABNORMAL HIGH (ref 11.5–15.5)
WBC: 9.6 10*3/uL (ref 4.0–10.5)
nRBC: 0 % (ref 0.0–0.2)

## 2022-10-19 LAB — MAGNESIUM: Magnesium: 2.7 mg/dL — ABNORMAL HIGH (ref 1.7–2.4)

## 2022-10-19 LAB — HEPARIN LEVEL (UNFRACTIONATED): Heparin Unfractionated: 0.41 [IU]/mL (ref 0.30–0.70)

## 2022-10-19 MED ORDER — ACETAMINOPHEN 160 MG/5ML PO SOLN
650.0000 mg | Freq: Four times a day (QID) | ORAL | Status: DC | PRN
  Administered 2022-10-19 – 2022-10-20 (×4): 650 mg
  Filled 2022-10-19 (×3): qty 20.3

## 2022-10-19 MED ORDER — ACETAMINOPHEN 650 MG RE SUPP: 650.0000 mg | Freq: Four times a day (QID) | RECTAL | Status: DC | PRN

## 2022-10-19 MED ORDER — ACETAMINOPHEN 160 MG/5ML PO SOLN
ORAL | Status: AC
  Filled 2022-10-19: qty 20.3

## 2022-10-19 MED ORDER — METOPROLOL TARTRATE 25 MG PO TABS
37.5000 mg | ORAL_TABLET | Freq: Four times a day (QID) | ORAL | Status: DC
  Administered 2022-10-19 – 2022-10-21 (×9): 37.5 mg
  Filled 2022-10-19 (×10): qty 1

## 2022-10-19 NOTE — Plan of Care (Signed)
  Problem: Nutritional: Goal: Maintenance of adequate nutrition will improve Outcome: Progressing   Problem: Coping: Goal: Ability to adjust to condition or change in health will improve Outcome: Not Progressing   Problem: Metabolic: Goal: Ability to maintain appropriate glucose levels will improve Outcome: Not Progressing   Problem: Skin Integrity: Goal: Risk for impaired skin integrity will decrease Outcome: Not Progressing   Problem: Clinical Measurements: Goal: Ability to maintain clinical measurements within normal limits will improve Outcome: Not Progressing Goal: Respiratory complications will improve Outcome: Not Progressing Goal: Cardiovascular complication will be avoided Outcome: Not Progressing   Problem: Nutrition: Goal: Adequate nutrition will be maintained Outcome: Not Progressing   Problem: Pain Managment: Goal: General experience of comfort will improve Outcome: Not Progressing

## 2022-10-19 NOTE — Progress Notes (Signed)
NAME:  Raymond Hood, MRN:  [id number], DOB:  1970/03/01, LOS: 6 ADMISSION DATE:  10/20/2022, CONSULTATION DATE:  10/14/2022 REFERRING MD:  Dr. Antionette Char - [facility], CHIEF COMPLAINT:  Cardiac arrest    History of Present Illness:  Raymond Hood is a 52 year old male with a past medical history significant for COPD, OSA/OHS, chronic HFpEF, type 2 diabetes, obesity, and schizoaffective disorder who presented to the ED at [facility] long 10/3 after suffering mechanical fall resulting in bilateral leg and lower back pain.  Post fall patient was able to bear weight on lower extremities and ambulate but reports lower back and leg pain.  On ED arrival patient was seen hypoxic, tachypneic, and slightly tachycardic but hemodynamically stable.  CT chest/abdomen/pelvis notable for diffuse body wall edema and small right pleural effusion.  Patient was admitted per hospitalist for decompensated HFpEF and hypoxic respiratory failure.  Overnight/early a.m. 10/4 patient's suffered in-hospital cardiac arrest.  Per report patient became hypoxic and bradycardic with transition to PEA cardiac arrest with approximate 15 to 20 minutes of downtime.  PCCM consulted for further assistance in management.  Pertinent  Medical History  COPD, OSA/OHS, chronic HFpEF, type 2 diabetes, obesity, and schizoaffective disorder   Significant Hospital Events: Including procedures, antibiotic start and stop dates in addition to other pertinent events   10/3 presented after mechanical fall with a chief complaint of back pain and bilateral leg pain admitted for decompensated HFpEF and hypoxic respiratory failure 10/4 and hospital PEA cardiac arrest, approximate 20-minute downtime 10/4 EEG without sz 10/5 echo LVEF 35-40%. Global HK. Moderate reduction of RV sys function. Moderately enlarged. 10/7 no meaningful neurologic recovery  Interim History / Subjective:  Amiodarone No sedation 0.40, PEEP 8 I/O- 3.7 L total   Objective:  Blood pressure  122/74, pulse (!) 57, temperature (!) 100.7 F (38.2 C), temperature source Axillary, resp. rate (!) 27, height 5\' 8"  (1.727 m), weight (!) 206.7 kg, SpO2 99%.    Vent Mode: PRVC FiO2 (%):  [40 %] 40 % Set Rate:  [24 bmp] 24 bmp Vt Set:  [540 mL] 540 mL PEEP:  [8 cmH20] 8 cmH20 Plateau Pressure:  [16 cmH20-34 cmH20] 27 cmH20   Intake/Output Summary (Last 24 hours) at 10/19/2022 0752 Last data filed at 10/19/2022 8416 Gross per 24 hour  Intake 3348.32 ml  Output 1800 ml  Net 1548.32 ml   Filed Weights   10/16/22 2345 10/17/22 0400 10/18/22 0702  Weight: (!) 209.6 kg (!) 208.3 kg (!) 206.7 kg   Physical Examination: General: Critically ill middle aged male in NAD on vent HEENT: [place]/AT, PERRL, no JVD Neuro: Sedated on vent CV: IRIR tachy to 130s. No MRG PULM: Rhonchi L > R GI: Soft, non-tender, hyperactive Extremities: lower extremity venous stasis changes.  Skin: Bilateral lower extremity erythema and superficial skin tears  Resolved Hospital Problem list     Assessment & Plan:  Acute hypoxemic respiratory failure: Presumed CHF exacerbation, pulmonary edema.  Decompensated OSA/OHS Possible aspiration with code event. -Continue current ventilator management, okay to try PSV but mental status precludes extubation -Adjusting diuretics daily depending on hemodynamics, renal function.  Okay to repeat Lasix today 10/9 -Unasyn day 6 of 7 -VAP prevention order set -PAD protocol, sedation currently held.  Dilaudid available.  Has been given for tachycardia   PEA arrest with resultant metabolic encephalopathy: In setting of hypoxemia and refusal of NIPPV.  25 minutes resuscitation.  Neurologic status remains very poor but opening eyes a little bit, may be slightly better  but overall no significant improvement. EEG negative for seizure.  -Minimize any sedating medications to follow neuroexam   Hypotension/shock: After PEA arrest. Vasopressors now weaned off. -Following off  norepinephrine -Diuretics dose daily as above   A-fib with RVR: New onset.  Likely in the setting of arrest and cardiac abnormalities. Refractory to typical therapies.  Tachycardia at least in part due to discomfort, lifting sedation -IV amiodarone > increase gtt to 60 and follow -Pain control with Dilaudid, continue to try to minimize sedation.  Typically being given for tachycardia -Receiving scheduled metoprolol with hold parameters -Heparin infusion  AKI: Suspect hypervolemic, stable -Follow BMP, urine output with diuretics   Right lower extremity cellulitis versus chronic venous changes -Doxycycline day 6 of 7  Goals of care:  -Patient's friends are involved including his primary contact Tamara.  Apparently there is no family available. See IPAL note 10/8.  Poor prognosis for meaningful neurological recovery.  Plan to follow off sedation as able, update on 10/10.  Considering withdrawal of care  Best Practice (right click and "Reselect all SmartList Selections" daily)   Diet/type: Tube feeds DVT prophylaxis: systemic heparin GI prophylaxis: PPI Lines: N/A Foley:  N/A Code Status:  full code Last date of multidisciplinary goals of care discussion: Friend updated daily (no family)  Critical care time: 55 minutes    Levy Pupa, MD, PhD 10/19/2022, 7:53 AM [facility] [phone] or if no answer before 7:00PM call [phone] For any issues after 7:00PM please call eLink [phone]

## 2022-10-19 NOTE — Progress Notes (Signed)
PHARMACY - ANTICOAGULATION CONSULT NOTE  Pharmacy Consult for Heparin Indication: atrial fibrillation  No Known Allergies  Patient Measurements: Height: 5\' 8"  (172.7 cm) Weight: (!) 206.7 kg (455 lb 11.1 oz) IBW/kg (Calculated) : 68.4 Heparin Dosing Weight: 123 kg  Vital Signs: Temp: 100.6 F (38.1 C) (10/09 1116) Temp Source: Oral (10/09 1116) BP: 114/61 (10/09 1135) Pulse Rate: 138 (10/09 1135)  Labs: Recent Labs    10/17/22 0544 10/17/22 0838 10/17/22 1712 10/18/22 0447 10/19/22 0510  HGB 13.0  --   --  13.3 14.8  HCT 44.4  --   --  46.1 51.8  PLT 153  --   --  161 153  HEPARINUNFRC  --    < > 0.42 0.42 0.41  CREATININE 1.17  --   --  1.26* 1.03   < > = values in this interval not displayed.    Estimated Creatinine Clearance: 151.8 mL/min (by C-G formula based on SCr of 1.03 mg/dL).  Assessment: 16 yoM with PMH HFpEF, DM2, COPD, OSA/OHS, morbid obesity admitted 10/3 for fall. Suffered PEA arrest on 10/4, and subsequently developed AFib w/ RVR. Remains intubated but off pressors & sedation. Pharmacy consulted to dose IV heparin for Afib. No anticoagulation prior to admission.  Today, 10/19/2022: CBC remains stable WNL Daily heparin level therapeutic and stable on 2800 units/hr SCr back to baseline No bleeding or infusion issues per RN  Goal of Therapy:  Heparin level 0.3-0.7 units/ml Monitor platelets by anticoagulation protocol: Yes   Plan:  Continue heparin infusion at 2800 units/hr Monitor daily heparin level, CBC, signs/symptoms of bleeding F/u plans for long-term anticoagulation, pending clinical improvement and GOC decisions   Thank you for allowing pharmacy to be a part of this patient's care.  Danford Bad, PharmD, BCPS Clinical Pharmacist 10/19/2022 11:39 AM

## 2022-10-19 NOTE — Progress Notes (Signed)
eLink Physician-Brief Progress Note Patient Name: Raymond Hood DOB: Jan 08, 1971 MRN: [id number]   Date of Service  10/19/2022  HPI/Events of Note  52 year old with severe obesity chronic diastolic dysfunction with heart failure diabetes COPD presented with pain after fall found to be hypoxemic subsequent PEA arrest after refusal to wear NIPPV now on the ventilator with minimal neurologic recovery.   eICU Interventions  Increase Metop Q6H to 37.5 mg     Intervention Category Intermediate Interventions: Arrhythmia - evaluation and management  Ellwyn Ergle 10/19/2022, 2:37 AM

## 2022-10-20 ENCOUNTER — Inpatient Hospital Stay (HOSPITAL_COMMUNITY): Payer: MEDICAID

## 2022-10-20 DIAGNOSIS — J9601 Acute respiratory failure with hypoxia: Secondary | ICD-10-CM | POA: Diagnosis not present

## 2022-10-20 LAB — CBC
HCT: 50 % (ref 39.0–52.0)
Hemoglobin: 13.9 g/dL (ref 13.0–17.0)
MCH: 26.1 pg (ref 26.0–34.0)
MCHC: 27.8 g/dL — ABNORMAL LOW (ref 30.0–36.0)
MCV: 94 fL (ref 80.0–100.0)
Platelets: 159 10*3/uL (ref 150–400)
RBC: 5.32 MIL/uL (ref 4.22–5.81)
RDW: 18 % — ABNORMAL HIGH (ref 11.5–15.5)
WBC: 11.4 10*3/uL — ABNORMAL HIGH (ref 4.0–10.5)
nRBC: 0 % (ref 0.0–0.2)

## 2022-10-20 LAB — GLUCOSE, CAPILLARY
Glucose-Capillary: 177 mg/dL — ABNORMAL HIGH (ref 70–99)
Glucose-Capillary: 192 mg/dL — ABNORMAL HIGH (ref 70–99)
Glucose-Capillary: 193 mg/dL — ABNORMAL HIGH (ref 70–99)
Glucose-Capillary: 197 mg/dL — ABNORMAL HIGH (ref 70–99)
Glucose-Capillary: 207 mg/dL — ABNORMAL HIGH (ref 70–99)
Glucose-Capillary: 220 mg/dL — ABNORMAL HIGH (ref 70–99)
Glucose-Capillary: 220 mg/dL — ABNORMAL HIGH (ref 70–99)

## 2022-10-20 LAB — BASIC METABOLIC PANEL
Anion gap: 7 (ref 5–15)
BUN: 46 mg/dL — ABNORMAL HIGH (ref 6–20)
CO2: 30 mmol/L (ref 22–32)
Calcium: 8.1 mg/dL — ABNORMAL LOW (ref 8.9–10.3)
Chloride: 111 mmol/L (ref 98–111)
Creatinine, Ser: 1.19 mg/dL (ref 0.61–1.24)
GFR, Estimated: >60 mL/min (ref >60)
Glucose, Bld: 205 mg/dL — ABNORMAL HIGH (ref 70–99)
Potassium: 4.3 mmol/L (ref 3.5–5.1)
Sodium: 148 mmol/L — ABNORMAL HIGH (ref 135–145)

## 2022-10-20 LAB — MAGNESIUM: Magnesium: 2.6 mg/dL — ABNORMAL HIGH (ref 1.7–2.4)

## 2022-10-20 LAB — PHOSPHORUS: Phosphorus: 3.8 mg/dL (ref 2.5–4.6)

## 2022-10-20 LAB — HEPARIN LEVEL (UNFRACTIONATED): Heparin Unfractionated: 0.49 [IU]/mL (ref 0.30–0.70)

## 2022-10-20 MED ORDER — ORAL CARE MOUTH RINSE
15.0000 mL | OROMUCOSAL | Status: DC
  Administered 2022-10-20 – 2022-10-21 (×14): 15 mL via OROMUCOSAL

## 2022-10-20 MED ORDER — INSULIN GLARGINE-YFGN 100 UNIT/ML ~~LOC~~ SOLN
25.0000 [IU] | Freq: Two times a day (BID) | SUBCUTANEOUS | Status: DC
  Administered 2022-10-20 – 2022-10-21 (×2): 25 [IU] via SUBCUTANEOUS
  Filled 2022-10-20 (×3): qty 0.25

## 2022-10-20 MED ORDER — FAMOTIDINE 40 MG/5ML PO SUSR
20.0000 mg | Freq: Two times a day (BID) | ORAL | Status: DC
  Administered 2022-10-21: 20 mg
  Filled 2022-10-20: qty 2.5

## 2022-10-20 MED ORDER — ORAL CARE MOUTH RINSE: 15.0000 mL | OROMUCOSAL | Status: DC | PRN

## 2022-10-20 NOTE — Progress Notes (Signed)
NAME:  Raymond Hood, MRN:  [id number], DOB:  19-Aug-1970, LOS: 7 ADMISSION DATE:  11/10/2022, CONSULTATION DATE:  10/14/2022 REFERRING MD:  Dr. Antionette Char - [facility], CHIEF COMPLAINT:  Cardiac arrest    History of Present Illness:  Raymond Hood is a 52 year old male with a past medical history significant for COPD, OSA/OHS, chronic HFpEF, type 2 diabetes, obesity, and schizoaffective disorder who presented to the ED at [facility] long 10/3 after suffering mechanical fall resulting in bilateral leg and lower back pain.  Post fall patient was able to bear weight on lower extremities and ambulate but reports lower back and leg pain.  On ED arrival patient was seen hypoxic, tachypneic, and slightly tachycardic but hemodynamically stable.  CT chest/abdomen/pelvis notable for diffuse body wall edema and small right pleural effusion.  Patient was admitted per hospitalist for decompensated HFpEF and hypoxic respiratory failure.  Overnight/early a.m. 10/4 patient's suffered in-hospital cardiac arrest.  Per report patient became hypoxic and bradycardic with transition to PEA cardiac arrest with approximate 15 to 20 minutes of downtime.  PCCM consulted for further assistance in management.  Pertinent  Medical History  COPD, OSA/OHS, chronic HFpEF, type 2 diabetes, obesity, and schizoaffective disorder   Significant Hospital Events: Including procedures, antibiotic start and stop dates in addition to other pertinent events   10/3 presented after mechanical fall with a chief complaint of back pain and bilateral leg pain admitted for decompensated HFpEF and hypoxic respiratory failure 10/4 and hospital PEA cardiac arrest, approximate 20-minute downtime 10/4 EEG without sz 10/5 echo LVEF 35-40%. Global HK. Moderate reduction of RV sys function. Moderately enlarged. 10/7 no meaningful neurologic recovery  Interim History / Subjective:   Converted to NSR overnight Amiodarone increased to 60 on 10/9, now back to 30 Not on  any sedation 0.40, PEEP 8 I/O- 850 cc total    Objective:  Blood pressure 114/71, pulse 81, temperature 100 F (37.8 C), temperature source Axillary, resp. rate (!) 24, height 5\' 8"  (1.727 m), weight (!) 205.8 kg, SpO2 91%.    Vent Mode: PRVC FiO2 (%):  [40 %-45 %] 45 % Set Rate:  [24 bmp] 24 bmp Vt Set:  [540 mL] 540 mL PEEP:  [8 cmH20] 8 cmH20 Plateau Pressure:  [24 cmH20-28 cmH20] 24 cmH20   Intake/Output Summary (Last 24 hours) at 10/20/2022 1114 Last data filed at 10/20/2022 1018 Gross per 24 hour  Intake 3636.51 ml  Output 750 ml  Net 2886.51 ml   Filed Weights   10/17/22 0400 10/18/22 0702 10/20/22 0800  Weight: (!) 208.3 kg (!) 206.7 kg (!) 205.8 kg   Physical Examination: General: Obese ill-appearing man on mechanical ventilation HEENT: Tube in place, some thin oral secretions Neuro: Completely unresponsive.  He does have an intact cough and gag, spontaneous drive to breathe intact.  Does try to open eyes to pain but he has a disconjugate gaze, upward gaze CV: Regular, distant, 80s PULM: Lateral rhonchi, no wheezes or crackles GI: Obese, nondistended with active bowel sounds Extremities: Lower extremity venous stasis changes, ecchymoses Skin: Lower extremity erythema, some superficial skin tears, no overt cellulitis  Resolved Hospital Problem list     Assessment & Plan:  Acute hypoxemic respiratory failure: Presumed CHF exacerbation, pulmonary edema.  Decompensated OSA/OHS Possible aspiration with code event. -Continue current ventilator management.  Okay to try PSV but his mental status precludes extubation as he cannot protect his airway -Tolerating diuresis although note evolving hypernatremia.  Will hold off on Lasix today 10/10.  Consider adding  back, consider free water depending on trend on 10/11 -Unasyn day 7 of 7 on 10/10 -VAP prevention orders -PAD protocol in place with symptom triggered Dilaudid, Versed.  Off sedating drips.  Would like to hold  off if at all possible to allow best assessment of any evolving neurological changes   PEA arrest with resultant metabolic encephalopathy: In setting of hypoxemia and refusal of NIPPV.  25 minutes resuscitation.  Neurologic status remains very poor but opening eyes a little bit, may be slightly better but overall no significant improvement. EEG negative for seizure.  -Minimize sedation as above -Goals of care discussions ongoing   Hypotension/shock: After PEA arrest. Vasopressors now weaned off. -Following off norepinephrine -Diuresing, dosing daily.  Plan to hold on 10/10   A-fib with RVR: New onset.  Likely in the setting of arrest and cardiac abnormalities.  Converted back to NSR on 10/9 -IV amiodarone > back to 30 -Receiving scheduled Toprol with hold parameters in place -Heparin infusion  AKI: Suspect hypervolemic, stable -Follow urine output, BMP   Right lower extremity cellulitis versus chronic venous changes -Doxycycline day 7 of 7  Goals of care:  -No family available but patient does have friends who are his primary contacts.  Spokesperson is Dealer.  Greatly appreciate Dr. Luisa Hart Wright's assistance.  He has served as the patient's PCP and had an opportunity to talk to [place] 10/10.  She understands that the patient has a poor prognosis for neurological recovery, that we are waiting to see if there is a neurological change.  We will revisit his status on 10/11 and discuss options including possible transition to comfort care.   Best Practice (right click and "Reselect all SmartList Selections" daily)   Diet/type: Tube feeds DVT prophylaxis: systemic heparin GI prophylaxis: PPI Lines: N/A Foley:  N/A Code Status:  full code Last date of multidisciplinary goals of care discussion: Friend updated daily (no family)  Critical care time: 32 minutes    Levy Pupa, MD, PhD 10/20/2022, 11:14 AM [place] [facility] [phone] or if no answer before  7:00PM call [phone] For any issues after 7:00PM please call eLink [phone]

## 2022-10-20 NOTE — Progress Notes (Signed)
Plugging noted again. Removed from vent, manually ventilated, lavage/suction. Placed back on vent with previous settings. Multiple large plugs removed from ETT that clogged the ballard and HME. Ballard suction and HME changed. RN made aware.

## 2022-10-20 NOTE — Plan of Care (Signed)
PT still requiring high level of fio2 and peep on ventilator. Despite decrease in sedatives and pain medications patient still has no purposeful response to stimulation. Fio2 slightly increased today following drop in oxygen saturation.

## 2022-10-20 NOTE — Plan of Care (Signed)
  Problem: Fluid Volume: Goal: Ability to maintain a balanced intake and output will improve Outcome: Not Progressing   Problem: Metabolic: Goal: Ability to maintain appropriate glucose levels will improve Outcome: Not Progressing   Problem: Tissue Perfusion: Goal: Adequacy of tissue perfusion will improve Outcome: Not Progressing   Problem: Pain Managment: Goal: General experience of comfort will improve Outcome: Not Progressing   Problem: Education: Goal: Ability to demonstrate management of disease process will improve Outcome: Not Met (add Reason) Goal: Ability to verbalize understanding of medication therapies will improve Outcome: Not Met (add Reason) Goal: Individualized Educational Video(s) Outcome: Not Met (add Reason)   Problem: Activity: Goal: Capacity to carry out activities will improve Outcome: Not Met (add Reason)   Problem: Coping: Goal: Ability to adjust to condition or change in health will improve Outcome: Not Met (add Reason)   Problem: Health Behavior/Discharge Planning: Goal: Ability to identify and utilize available resources and services will improve Outcome: Not Met (add Reason) Goal: Ability to manage health-related needs will improve Outcome: Not Met (add Reason)   Problem: Health Behavior/Discharge Planning: Goal: Ability to manage health-related needs will improve Outcome: Not Met (add Reason)   Problem: Safety: Goal: Ability to remain free from injury will improve Outcome: Not Met (add Reason)  Pt is intubated and unmet most of careplan.

## 2022-10-20 NOTE — Progress Notes (Signed)
Peak Pressures greater than 60 with low exhaled Vt and desaturations. Unable to successfully pass suction catheter fully. Lavage/Suction for large bloody plug. Patency restored to ETT. Volumes rebounded and peak pressures now mid 40's. SpO2 90% on FiO2 .60.

## 2022-10-20 NOTE — Progress Notes (Signed)
PHARMACY - ANTICOAGULATION CONSULT NOTE  Pharmacy Consult for Heparin Indication: atrial fibrillation  No Known Allergies  Patient Measurements: Height: 5\' 8"  (172.7 cm) Weight:  (unable to weigh him. bed is not available) IBW/kg (Calculated) : 68.4 Heparin Dosing Weight: 123 kg  Vital Signs: Temp: 99.9 F (37.7 C) (10/10 0359) Temp Source: Axillary (10/10 0359) BP: 120/66 (10/10 0506) Pulse Rate: 83 (10/10 0506)  Labs: Recent Labs    10/18/22 0447 10/19/22 0510 10/20/22 0336  HGB 13.3 14.8 13.9  HCT 46.1 51.8 50.0  PLT 161 153 159  HEPARINUNFRC 0.42 0.41 0.49  CREATININE 1.26* 1.03 1.19    Estimated Creatinine Clearance: 131.4 mL/min (by C-G formula based on SCr of 1.19 mg/dL).  Assessment: 75 yoM with PMH HFpEF, DM2, COPD, OSA/OHS, morbid obesity admitted 10/3 for fall. Suffered PEA arrest on 10/4, and subsequently developed AFib w/ RVR. Remains intubated but off pressors & sedation. Pharmacy consulted to dose IV heparin for Afib. No anticoagulation prior to admission.  Today, 10/20/2022: CBC remains stable WNL Daily heparin level therapeutic and stable on 2800 units/hr SCr had normalized after arrest, now trending back up No bleeding or infusion issues per RN  Goal of Therapy:  Heparin level 0.3-0.7 units/ml Monitor platelets by anticoagulation protocol: Yes   Plan:  Continue heparin infusion at 2800 units/hr Monitor daily heparin level, CBC, signs/symptoms of bleeding F/u plans for long-term anticoagulation, pending clinical improvement and GOC decisions   Thank you for allowing pharmacy to be a part of this patient's care.  Kilyn Maragh, Deirdre Evener, PharmD, BCPS Clinical Pharmacist 10/20/2022 7:28 AM

## 2022-10-20 NOTE — Progress Notes (Signed)
eLink Physician-Brief Progress Note Patient Name: Raymond Hood DOB: 15-Mar-1970 MRN: [id number]   Date of Service  10/20/2022  HPI/Events of Note  52 year old with severe obesity chronic diastolic dysfunction with heart failure diabetes COPD presented with pain after fall found to be hypoxemic subsequent PEA arrest after refusal to wear NIPPV now on the ventilator with minimal neurologic recovery.   Converted to normal sinus rhythm around 0200, has been on amiodarone 60 mg for 6 days.  eICU Interventions  Decreased to 30 mg dosing for 18 hours and then discontinue.  Will defer continuation of enteral amiodarone to the day team.     Intervention Category Intermediate Interventions: Arrhythmia - evaluation and management  Raymond Hood 10/20/2022, 5:52 AM

## 2022-10-21 DIAGNOSIS — J9601 Acute respiratory failure with hypoxia: Secondary | ICD-10-CM | POA: Diagnosis not present

## 2022-10-21 LAB — CBC
HCT: 49.2 % (ref 39.0–52.0)
Hemoglobin: 14.1 g/dL (ref 13.0–17.0)
MCH: 26.7 pg (ref 26.0–34.0)
MCHC: 28.7 g/dL — ABNORMAL LOW (ref 30.0–36.0)
MCV: 93.2 fL (ref 80.0–100.0)
Platelets: 150 10*3/uL (ref 150–400)
RBC: 5.28 MIL/uL (ref 4.22–5.81)
RDW: 17.7 % — ABNORMAL HIGH (ref 11.5–15.5)
WBC: 12.7 10*3/uL — ABNORMAL HIGH (ref 4.0–10.5)
nRBC: 0 % (ref 0.0–0.2)

## 2022-10-21 LAB — BASIC METABOLIC PANEL
Anion gap: 11 (ref 5–15)
BUN: 40 mg/dL — ABNORMAL HIGH (ref 6–20)
CO2: 27 mmol/L (ref 22–32)
Calcium: 8 mg/dL — ABNORMAL LOW (ref 8.9–10.3)
Chloride: 109 mmol/L (ref 98–111)
Creatinine, Ser: 1.02 mg/dL (ref 0.61–1.24)
GFR, Estimated: >60 mL/min (ref >60)
Glucose, Bld: 181 mg/dL — ABNORMAL HIGH (ref 70–99)
Potassium: 4.4 mmol/L (ref 3.5–5.1)
Sodium: 147 mmol/L — ABNORMAL HIGH (ref 135–145)

## 2022-10-21 LAB — GLUCOSE, CAPILLARY
Glucose-Capillary: 158 mg/dL — ABNORMAL HIGH (ref 70–99)
Glucose-Capillary: 186 mg/dL — ABNORMAL HIGH (ref 70–99)
Glucose-Capillary: 189 mg/dL — ABNORMAL HIGH (ref 70–99)

## 2022-10-21 LAB — HEPARIN LEVEL (UNFRACTIONATED): Heparin Unfractionated: 0.56 [IU]/mL (ref 0.30–0.70)

## 2022-10-21 MED ORDER — MORPHINE 100MG IN NS 100ML (1MG/ML) PREMIX INFUSION
0.0000 mg/h | INTRAVENOUS | Status: DC
  Administered 2022-10-21: 5 mg/h via INTRAVENOUS
  Filled 2022-10-21: qty 100

## 2022-10-21 MED ORDER — GLYCOPYRROLATE 0.2 MG/ML IJ SOLN
0.2000 mg | INTRAMUSCULAR | Status: DC | PRN
  Filled 2022-10-21: qty 1

## 2022-10-21 MED ORDER — GLYCOPYRROLATE 0.2 MG/ML IJ SOLN: 0.2000 mg | INTRAMUSCULAR | Status: DC | PRN

## 2022-10-21 MED ORDER — ACETAMINOPHEN 325 MG PO TABS: 650.0000 mg | ORAL_TABLET | Freq: Four times a day (QID) | ORAL | Status: DC | PRN

## 2022-10-21 MED ORDER — ACETAMINOPHEN 650 MG RE SUPP: 650.0000 mg | Freq: Four times a day (QID) | RECTAL | Status: DC | PRN

## 2022-10-21 MED ORDER — HYDRALAZINE HCL 20 MG/ML IJ SOLN: 10.0000 mg | Freq: Four times a day (QID) | INTRAMUSCULAR | Status: DC | PRN

## 2022-10-21 MED ORDER — MORPHINE BOLUS VIA INFUSION
5.0000 mg | INTRAVENOUS | Status: DC | PRN
  Administered 2022-10-21 (×3): 5 mg via INTRAVENOUS

## 2022-10-21 MED ORDER — POLYVINYL ALCOHOL 1.4 % OP SOLN: 1.0000 [drp] | Freq: Four times a day (QID) | OPHTHALMIC | Status: DC | PRN

## 2022-10-21 MED ORDER — GLYCOPYRROLATE 1 MG PO TABS: 1.0000 mg | ORAL_TABLET | ORAL | Status: DC | PRN

## 2022-10-23 LAB — CULTURE, BLOOD (ROUTINE X 2)
Culture: NO GROWTH
Culture: NO GROWTH
Special Requests: ADEQUATE
Special Requests: ADEQUATE

## 2022-10-27 NOTE — Telephone Encounter (Signed)
No more efforts continued due to deceased pt.   [place] Setting EMT-P Community Paramedic  [phone]

## 2022-11-01 ENCOUNTER — Ambulatory Visit: Payer: Medicaid Other | Admitting: Family Medicine

## 2022-11-02 ENCOUNTER — Other Ambulatory Visit: Payer: Self-pay

## 2022-11-07 DIAGNOSIS — A419 Sepsis, unspecified organism: Secondary | ICD-10-CM | POA: Diagnosis not present

## 2022-11-07 DIAGNOSIS — I4891 Unspecified atrial fibrillation: Secondary | ICD-10-CM | POA: Diagnosis not present

## 2022-11-07 DIAGNOSIS — J690 Pneumonitis due to inhalation of food and vomit: Secondary | ICD-10-CM | POA: Diagnosis not present

## 2022-11-07 DIAGNOSIS — E870 Hyperosmolality and hypernatremia: Secondary | ICD-10-CM | POA: Diagnosis not present

## 2022-11-11 NOTE — Plan of Care (Signed)
  Problem: Cardiac: Goal: Ability to achieve and maintain adequate cardiopulmonary perfusion will improve Outcome: Progressing   Problem: Skin Integrity: Goal: Risk for impaired skin integrity will decrease Outcome: Progressing   Problem: Tissue Perfusion: Goal: Adequacy of tissue perfusion will improve Outcome: Progressing

## 2022-11-11 NOTE — Death Summary Note (Signed)
tube tip overlies the mid stomach. Electronically Signed   By: Jasmine Pang M.D.   On: 10/20/2022 20:27   DG CHEST PORT 1 VIEW  Result Date:  10/18/2022 CLINICAL DATA:  Fever, intubated EXAM: PORTABLE CHEST 1 VIEW COMPARISON:  10/14/2022 FINDINGS: 3 frontal views of the chest are obtained. Evaluation is limited by portable technique, patient positioning, and patient body habitus. Endotracheal tube identified overlying tracheal air column, 4.1 cm above carina. Enteric catheter passes below diaphragm, tip excluded by collimation. Right internal jugular catheter tip overlies the atriocaval junction. Cardiac silhouette is enlarged. Elevation the right hemidiaphragm, with improved aeration at the right lung since prior study. The left chest is clear. No effusion or pneumothorax. IMPRESSION: 1. Limited study as above. 2. Grossly stable support devices. 3. Improving aeration of the right lung, with persistent elevation of the right hemidiaphragm consistent with volume loss. Electronically Signed   By: Sharlet Salina M.D.   On: 10/18/2022 10:09   ECHOCARDIOGRAM COMPLETE  Result Date: 10/15/2022    ECHOCARDIOGRAM REPORT   Patient Name:   Raymond Hood Date of Exam: 10/15/2022 Medical Rec #:  [id number]     Height:       68.0 in Accession #:    [id number]    Weight:       470.2 lb Date of Birth:  [birth year]     BSA:          2.939 m Patient Age:    51 years      BP:           155/89 mmHg Patient Gender: M             HR:           115 bpm. Exam Location:  Inpatient Procedure: 2D Echo, Cardiac Doppler, Color Doppler and Intracardiac            Opacification Agent Indications:    I50.40* Unspecified combined systolic (congestive) and diastolic                 (congestive) heart failure  History:        Patient has prior history of Echocardiogram examinations, most                 recent 07/25/2021. CHF, COPD, Signs/Symptoms:Altered Mental                 Status, Shortness of Breath and Dyspnea; Risk Factors:Diabetes,                 Sleep Apnea, Hypertension and Current Smoker.  Sonographer:    Sheralyn Boatman RDCS Referring Phys: [id number] TIMOTHY S OPYD  Sonographer  Comments: Technically challenging study due to limited acoustic windows, Technically difficult study due to poor echo windows, suboptimal parasternal window, suboptimal apical window, suboptimal subcostal window, patient is obese and echo performed with patient supine and on artificial respirator. Image acquisition challenging due to patient body habitus. IMPRESSIONS  1. Poor image quality unable to see endocardium rapid HR in afib also makes estimate of EF difficult Global hypokinesis abnormal septal motion . Left ventricular ejection fraction, by estimation, is 35 to 40%. The left ventricle has moderately decreased  function. The left ventricle demonstrates global hypokinesis. There is mild left ventricular hypertrophy. Left ventricular diastolic parameters are indeterminate.  2. Right ventricular systolic function is moderately reduced. The right ventricular size is moderately enlarged.  3. Left atrial size was mildly dilated.  4. The mitral valve is normal in structure.  tube tip overlies the mid stomach. Electronically Signed   By: Jasmine Pang M.D.   On: 10/20/2022 20:27   DG CHEST PORT 1 VIEW  Result Date:  10/18/2022 CLINICAL DATA:  Fever, intubated EXAM: PORTABLE CHEST 1 VIEW COMPARISON:  10/14/2022 FINDINGS: 3 frontal views of the chest are obtained. Evaluation is limited by portable technique, patient positioning, and patient body habitus. Endotracheal tube identified overlying tracheal air column, 4.1 cm above carina. Enteric catheter passes below diaphragm, tip excluded by collimation. Right internal jugular catheter tip overlies the atriocaval junction. Cardiac silhouette is enlarged. Elevation the right hemidiaphragm, with improved aeration at the right lung since prior study. The left chest is clear. No effusion or pneumothorax. IMPRESSION: 1. Limited study as above. 2. Grossly stable support devices. 3. Improving aeration of the right lung, with persistent elevation of the right hemidiaphragm consistent with volume loss. Electronically Signed   By: Sharlet Salina M.D.   On: 10/18/2022 10:09   ECHOCARDIOGRAM COMPLETE  Result Date: 10/15/2022    ECHOCARDIOGRAM REPORT   Patient Name:   Raymond Hood Date of Exam: 10/15/2022 Medical Rec #:  [id number]     Height:       68.0 in Accession #:    [id number]    Weight:       470.2 lb Date of Birth:  [birth year]     BSA:          2.939 m Patient Age:    51 years      BP:           155/89 mmHg Patient Gender: M             HR:           115 bpm. Exam Location:  Inpatient Procedure: 2D Echo, Cardiac Doppler, Color Doppler and Intracardiac            Opacification Agent Indications:    I50.40* Unspecified combined systolic (congestive) and diastolic                 (congestive) heart failure  History:        Patient has prior history of Echocardiogram examinations, most                 recent 07/25/2021. CHF, COPD, Signs/Symptoms:Altered Mental                 Status, Shortness of Breath and Dyspnea; Risk Factors:Diabetes,                 Sleep Apnea, Hypertension and Current Smoker.  Sonographer:    Sheralyn Boatman RDCS Referring Phys: [id number] TIMOTHY S OPYD  Sonographer  Comments: Technically challenging study due to limited acoustic windows, Technically difficult study due to poor echo windows, suboptimal parasternal window, suboptimal apical window, suboptimal subcostal window, patient is obese and echo performed with patient supine and on artificial respirator. Image acquisition challenging due to patient body habitus. IMPRESSIONS  1. Poor image quality unable to see endocardium rapid HR in afib also makes estimate of EF difficult Global hypokinesis abnormal septal motion . Left ventricular ejection fraction, by estimation, is 35 to 40%. The left ventricle has moderately decreased  function. The left ventricle demonstrates global hypokinesis. There is mild left ventricular hypertrophy. Left ventricular diastolic parameters are indeterminate.  2. Right ventricular systolic function is moderately reduced. The right ventricular size is moderately enlarged.  3. Left atrial size was mildly dilated.  4. The mitral valve is normal in structure.  tube tip overlies the mid stomach. Electronically Signed   By: Jasmine Pang M.D.   On: 10/20/2022 20:27   DG CHEST PORT 1 VIEW  Result Date:  10/18/2022 CLINICAL DATA:  Fever, intubated EXAM: PORTABLE CHEST 1 VIEW COMPARISON:  10/14/2022 FINDINGS: 3 frontal views of the chest are obtained. Evaluation is limited by portable technique, patient positioning, and patient body habitus. Endotracheal tube identified overlying tracheal air column, 4.1 cm above carina. Enteric catheter passes below diaphragm, tip excluded by collimation. Right internal jugular catheter tip overlies the atriocaval junction. Cardiac silhouette is enlarged. Elevation the right hemidiaphragm, with improved aeration at the right lung since prior study. The left chest is clear. No effusion or pneumothorax. IMPRESSION: 1. Limited study as above. 2. Grossly stable support devices. 3. Improving aeration of the right lung, with persistent elevation of the right hemidiaphragm consistent with volume loss. Electronically Signed   By: Sharlet Salina M.D.   On: 10/18/2022 10:09   ECHOCARDIOGRAM COMPLETE  Result Date: 10/15/2022    ECHOCARDIOGRAM REPORT   Patient Name:   Raymond Hood Date of Exam: 10/15/2022 Medical Rec #:  [id number]     Height:       68.0 in Accession #:    [id number]    Weight:       470.2 lb Date of Birth:  [birth year]     BSA:          2.939 m Patient Age:    51 years      BP:           155/89 mmHg Patient Gender: M             HR:           115 bpm. Exam Location:  Inpatient Procedure: 2D Echo, Cardiac Doppler, Color Doppler and Intracardiac            Opacification Agent Indications:    I50.40* Unspecified combined systolic (congestive) and diastolic                 (congestive) heart failure  History:        Patient has prior history of Echocardiogram examinations, most                 recent 07/25/2021. CHF, COPD, Signs/Symptoms:Altered Mental                 Status, Shortness of Breath and Dyspnea; Risk Factors:Diabetes,                 Sleep Apnea, Hypertension and Current Smoker.  Sonographer:    Sheralyn Boatman RDCS Referring Phys: [id number] TIMOTHY S OPYD  Sonographer  Comments: Technically challenging study due to limited acoustic windows, Technically difficult study due to poor echo windows, suboptimal parasternal window, suboptimal apical window, suboptimal subcostal window, patient is obese and echo performed with patient supine and on artificial respirator. Image acquisition challenging due to patient body habitus. IMPRESSIONS  1. Poor image quality unable to see endocardium rapid HR in afib also makes estimate of EF difficult Global hypokinesis abnormal septal motion . Left ventricular ejection fraction, by estimation, is 35 to 40%. The left ventricle has moderately decreased  function. The left ventricle demonstrates global hypokinesis. There is mild left ventricular hypertrophy. Left ventricular diastolic parameters are indeterminate.  2. Right ventricular systolic function is moderately reduced. The right ventricular size is moderately enlarged.  3. Left atrial size was mildly dilated.  4. The mitral valve is normal in structure.  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  Secondary Diagnoses (including complications and co-morbidities):  Principal Problem:   Anoxic encephalopathy (HCC) Active Problems:   MDD (major depressive disorder), recurrent episode (HCC)   Schizo affective schizophrenia (HCC)   GERD (gastroesophageal reflux disease)   Tobacco abuse   Renal mass, right   COPD with chronic bronchitis (HCC)   Morbid obesity with BMI of 50.0-59.9, adult (HCC)   Sheltered homelessness   Asthma without status asthmaticus   Type 2 diabetes mellitus with hyperglycemia, with long-term current use of insulin (HCC)   OSA/OHS   Chronic diastolic heart failure (HCC)   Acute respiratory failure with hypoxia (HCC)   Anxiety and depression   Essential hypertension   Acute renal failure (ARF) (HCC)   Anasarca   Cellulitis of right lower extremity   Acute on chronic congestive heart failure (HCC)   Cardiac arrest (HCC)   Aspiration pneumonia (HCC)   Septic shock (HCC)   Atrial fibrillation with RVR (HCC)   Hypernatremia   Brief Hospital Course (including significant findings, care, treatment, and services provided and events leading to death)  Velmer Panjwani is a 52 y.o. year old male with a history of COPD, OSA/OHS, chronic HFpEF, type 2 diabetes, obesity, and schizoaffective disorder who presented to the ED at [facility] long 10/3 after suffering mechanical fall resulting in bilateral leg and lower back pain.  Post fall patient was able to bear weight on lower extremities and ambulate but reported lower back and leg pain.   On ED arrival patient was seen hypoxic, tachypneic, and slightly tachycardic but hemodynamically stable.  CT chest/abdomen/pelvis notable for diffuse body wall edema and small right pleural effusion.  Patient was admitted per hospitalist for decompensated HFpEF and hypoxic respiratory failure.   Overnight/early a.m. 10/4 patient's suffered in-hospital cardiac arrest.  Per report patient became hypoxic and bradycardic with transition to PEA cardiac arrest with approximate 15 to 20 minutes of downtime.  PCCM consulted for further assistance in management.  Required intubation mechanical ventilation, initiation of pressors for shock, presumed septic shock.  He was treated empirically for aspiration pneumonia as well as for right lower extremity cellulitis.  Course complicated by atrial fibrillation with RVR for which he was treated with metoprolol and amiodarone.  Also with acute renal insufficiency.  He was supported fully to follow for any evidence of neurological recovery.  Unfortunately his neurological exam off of sedating medications was consistent with severe anoxic brain injury with little chance for any meaningful neurological recovery.  Based on this discussions were undertaken with the patient's friends and medical representatives.  There was no available family.  Decision was ultimately made to transition to comfort care on 11/04/22.     Pertinent Labs and Studies  Significant Diagnostic Studies DG Abd 1 View  Result Date: 10/20/2022 CLINICAL DATA:  Orogastric tube EXAM: ABDOMEN - 1 VIEW COMPARISON:  10/14/2022 FINDINGS: Esophageal tube tip overlies the mid stomach. Endotracheal tube tip about 5.5 cm superior to carina. Right-sided vascular catheter tip at the right atrium. Pleural effusions and airspace disease at the bases IMPRESSION: Esophageal  tube tip overlies the mid stomach. Electronically Signed   By: Jasmine Pang M.D.   On: 10/20/2022 20:27   DG CHEST PORT 1 VIEW  Result Date:  10/18/2022 CLINICAL DATA:  Fever, intubated EXAM: PORTABLE CHEST 1 VIEW COMPARISON:  10/14/2022 FINDINGS: 3 frontal views of the chest are obtained. Evaluation is limited by portable technique, patient positioning, and patient body habitus. Endotracheal tube identified overlying tracheal air column, 4.1 cm above carina. Enteric catheter passes below diaphragm, tip excluded by collimation. Right internal jugular catheter tip overlies the atriocaval junction. Cardiac silhouette is enlarged. Elevation the right hemidiaphragm, with improved aeration at the right lung since prior study. The left chest is clear. No effusion or pneumothorax. IMPRESSION: 1. Limited study as above. 2. Grossly stable support devices. 3. Improving aeration of the right lung, with persistent elevation of the right hemidiaphragm consistent with volume loss. Electronically Signed   By: Sharlet Salina M.D.   On: 10/18/2022 10:09   ECHOCARDIOGRAM COMPLETE  Result Date: 10/15/2022    ECHOCARDIOGRAM REPORT   Patient Name:   Raymond Hood Date of Exam: 10/15/2022 Medical Rec #:  [id number]     Height:       68.0 in Accession #:    [id number]    Weight:       470.2 lb Date of Birth:  [birth year]     BSA:          2.939 m Patient Age:    51 years      BP:           155/89 mmHg Patient Gender: M             HR:           115 bpm. Exam Location:  Inpatient Procedure: 2D Echo, Cardiac Doppler, Color Doppler and Intracardiac            Opacification Agent Indications:    I50.40* Unspecified combined systolic (congestive) and diastolic                 (congestive) heart failure  History:        Patient has prior history of Echocardiogram examinations, most                 recent 07/25/2021. CHF, COPD, Signs/Symptoms:Altered Mental                 Status, Shortness of Breath and Dyspnea; Risk Factors:Diabetes,                 Sleep Apnea, Hypertension and Current Smoker.  Sonographer:    Sheralyn Boatman RDCS Referring Phys: [id number] TIMOTHY S OPYD  Sonographer  Comments: Technically challenging study due to limited acoustic windows, Technically difficult study due to poor echo windows, suboptimal parasternal window, suboptimal apical window, suboptimal subcostal window, patient is obese and echo performed with patient supine and on artificial respirator. Image acquisition challenging due to patient body habitus. IMPRESSIONS  1. Poor image quality unable to see endocardium rapid HR in afib also makes estimate of EF difficult Global hypokinesis abnormal septal motion . Left ventricular ejection fraction, by estimation, is 35 to 40%. The left ventricle has moderately decreased  function. The left ventricle demonstrates global hypokinesis. There is mild left ventricular hypertrophy. Left ventricular diastolic parameters are indeterminate.  2. Right ventricular systolic function is moderately reduced. The right ventricular size is moderately enlarged.  3. Left atrial size was mildly dilated.  4. The mitral valve is normal in structure.  tube tip overlies the mid stomach. Electronically Signed   By: Jasmine Pang M.D.   On: 10/20/2022 20:27   DG CHEST PORT 1 VIEW  Result Date:  10/18/2022 CLINICAL DATA:  Fever, intubated EXAM: PORTABLE CHEST 1 VIEW COMPARISON:  10/14/2022 FINDINGS: 3 frontal views of the chest are obtained. Evaluation is limited by portable technique, patient positioning, and patient body habitus. Endotracheal tube identified overlying tracheal air column, 4.1 cm above carina. Enteric catheter passes below diaphragm, tip excluded by collimation. Right internal jugular catheter tip overlies the atriocaval junction. Cardiac silhouette is enlarged. Elevation the right hemidiaphragm, with improved aeration at the right lung since prior study. The left chest is clear. No effusion or pneumothorax. IMPRESSION: 1. Limited study as above. 2. Grossly stable support devices. 3. Improving aeration of the right lung, with persistent elevation of the right hemidiaphragm consistent with volume loss. Electronically Signed   By: Sharlet Salina M.D.   On: 10/18/2022 10:09   ECHOCARDIOGRAM COMPLETE  Result Date: 10/15/2022    ECHOCARDIOGRAM REPORT   Patient Name:   Raymond Hood Date of Exam: 10/15/2022 Medical Rec #:  [id number]     Height:       68.0 in Accession #:    [id number]    Weight:       470.2 lb Date of Birth:  [birth year]     BSA:          2.939 m Patient Age:    51 years      BP:           155/89 mmHg Patient Gender: M             HR:           115 bpm. Exam Location:  Inpatient Procedure: 2D Echo, Cardiac Doppler, Color Doppler and Intracardiac            Opacification Agent Indications:    I50.40* Unspecified combined systolic (congestive) and diastolic                 (congestive) heart failure  History:        Patient has prior history of Echocardiogram examinations, most                 recent 07/25/2021. CHF, COPD, Signs/Symptoms:Altered Mental                 Status, Shortness of Breath and Dyspnea; Risk Factors:Diabetes,                 Sleep Apnea, Hypertension and Current Smoker.  Sonographer:    Sheralyn Boatman RDCS Referring Phys: [id number] TIMOTHY S OPYD  Sonographer  Comments: Technically challenging study due to limited acoustic windows, Technically difficult study due to poor echo windows, suboptimal parasternal window, suboptimal apical window, suboptimal subcostal window, patient is obese and echo performed with patient supine and on artificial respirator. Image acquisition challenging due to patient body habitus. IMPRESSIONS  1. Poor image quality unable to see endocardium rapid HR in afib also makes estimate of EF difficult Global hypokinesis abnormal septal motion . Left ventricular ejection fraction, by estimation, is 35 to 40%. The left ventricle has moderately decreased  function. The left ventricle demonstrates global hypokinesis. There is mild left ventricular hypertrophy. Left ventricular diastolic parameters are indeterminate.  2. Right ventricular systolic function is moderately reduced. The right ventricular size is moderately enlarged.  3. Left atrial size was mildly dilated.  4. The mitral valve is normal in structure.

## 2022-11-11 NOTE — Progress Notes (Signed)
Nutrition Brief Note  Chart reviewed. Pt now transitioning to comfort care.  No further nutrition interventions planned at this time.   Acea Yagi, MS, RD, LDN Inpatient Clinical Dietitian Contact information available via Amion   

## 2022-11-11 NOTE — Procedures (Signed)
Extubation Procedure Note  Patient Details:   Name: Raymond Hood DOB: 21-Jan-1970 MRN: [id number]   Airway Documentation:    Vent end date: (S) 10/30/2022 Vent end time: (S) 1403   Evaluation  O2 sats: currently acceptable Complications: No apparent complications Patient did tolerate procedure well. Bilateral Breath Sounds: Diminished, Expiratory wheezes   No phonation at this time, extubated per MD order to room air, comfort.  Wayne Both Johneisha Broaden 11/06/2022, 2:09 PM

## 2022-11-11 NOTE — Progress Notes (Signed)
eLink Physician-Brief Progress Note Patient Name: Raymond Hood DOB: 08/17/1970 MRN: [id number]   Date of Service  11-19-22  HPI/Events of Note  Previously in A-fib with RVR.  Running amiodarone.  24-hour order expired.  Currently in normal sinus rhythm.  eICU Interventions  Day team to consider adding enteral amiodarone, defer for now.     Intervention Category Intermediate Interventions: Arrhythmia - evaluation and management  Olivea Sonnen 19-Nov-2022, 6:15 AM

## 2022-11-11 NOTE — Progress Notes (Signed)
I spoke with the patient's 3 friends at bedside including his primary spokesperson Delaney Meigs.  Confirmed that he has a neurological injury, has not improved, likely has very poor prognosis for any meaningful neurological recovery.  Based on this we are agree that to best honor his wishes we would transition to comfort care.  I explained that process.  We will plan for compassionate extubation after morphine infusion has been initiated on November 12, 2022.  Levy Pupa, MD, PhD November 12, 2022, 1:39 PM [facility] [phone] or if no answer before 7:00PM call [phone] For any issues after 7:00PM please call eLink [phone]

## 2022-11-11 NOTE — Progress Notes (Signed)
NAME:  Raymond Hood, MRN:  [id number], DOB:  May 21, 1970, LOS: 8 ADMISSION DATE:  10/22/2022, CONSULTATION DATE:  10/14/2022 REFERRING MD:  Dr. Antionette Char - [facility], CHIEF COMPLAINT:  Cardiac arrest    History of Present Illness:  Raymond Hood is a 52 year old male with a past medical history significant for COPD, OSA/OHS, chronic HFpEF, type 2 diabetes, obesity, and schizoaffective disorder who presented to the ED at [facility] long 10/3 after suffering mechanical fall resulting in bilateral leg and lower back pain.  Post fall patient was able to bear weight on lower extremities and ambulate but reports lower back and leg pain.  On ED arrival patient was seen hypoxic, tachypneic, and slightly tachycardic but hemodynamically stable.  CT chest/abdomen/pelvis notable for diffuse body wall edema and small right pleural effusion.  Patient was admitted per hospitalist for decompensated HFpEF and hypoxic respiratory failure.  Overnight/early a.m. 10/4 patient's suffered in-hospital cardiac arrest.  Per report patient became hypoxic and bradycardic with transition to PEA cardiac arrest with approximate 15 to 20 minutes of downtime.  PCCM consulted for further assistance in management.  Pertinent  Medical History  COPD, OSA/OHS, chronic HFpEF, type 2 diabetes, obesity, and schizoaffective disorder   Significant Hospital Events: Including procedures, antibiotic start and stop dates in addition to other pertinent events   10/3 presented after mechanical fall with a chief complaint of back pain and bilateral leg pain admitted for decompensated HFpEF and hypoxic respiratory failure 10/4 and hospital PEA cardiac arrest, approximate 20-minute downtime 10/4 EEG without sz 10/5 echo LVEF 35-40%. Global HK. Moderate reduction of RV sys function. Moderately enlarged. 10/7 no meaningful neurologic recovery  Interim History / Subjective:   Remains in normal sinus rhythm Amiodarone infusion completed, discontinued  overnight Remains off any sedation Mucous plugging overnight required lavage and suction, bloody secretions obtained I/O+ 417 cc total No significant change in his neurological status reported    Objective:  Blood pressure (!) 153/81, pulse 84, temperature 99.6 F (37.6 C), temperature source Oral, resp. rate (!) 23, height 5\' 8"  (1.727 m), weight (!) 204 kg, SpO2 97%. CVP:  [21 mmHg] 21 mmHg  Vent Mode: PRVC FiO2 (%):  [40 %-60 %] 40 % Set Rate:  [24 bmp] 24 bmp Vt Set:  [540 mL] 540 mL PEEP:  [8 cmH20] 8 cmH20 Plateau Pressure:  [23 cmH20-27 cmH20] 23 cmH20   Intake/Output Summary (Last 24 hours) at 11/09/2022 0736 Last data filed at 11/04/2022 0600 Gross per 24 hour  Intake 2797.31 ml  Output 1150 ml  Net 1647.31 ml   Filed Weights   10/18/22 0702 10/20/22 0800 10/15/2022 0500  Weight: (!) 206.7 kg (!) 205.8 kg (!) 204 kg   Physical Examination: General: Ill-appearing obese man, ventilated HEENT: Oral secretions, thin.  Tube in place Neuro: Tries to open eyes with stimulation, upward gaze disconjugate.  Intact respiratory drive, intact cough and gag.  Does not track, have purposeful movement or follow commands CV: Regular, distant, 80s PULM: Bilateral scattered rhonchi without crackles or wheezes GI: Obese, nondistended with positive bowel sounds Extremities: Widespread lower extremity venous stasis changes and ecchymoses Skin: Some erythema, superficial skin tears, no evidence cellulitis  Resolved Hospital Problem list     Assessment & Plan:  Acute hypoxemic respiratory failure: Presumed CHF exacerbation, pulmonary edema.  Decompensated OSA/OHS Possible aspiration with code event. -Continue PRVC 8 cc/kg -Have been dosing diuretics daily depending on volume status, renal function, hemodynamics -Completed Unasyn 10/10 -Continue VAP prevention orders  -Minimizing any sedation to  optimize assessment of his neurological status   PEA arrest with resultant metabolic  encephalopathy: In setting of hypoxemia and refusal of NIPPV.  25 minutes resuscitation.  Neurologic status remains very poor but opening eyes a little bit, may be slightly better but overall no significant improvement. EEG negative for seizure.  -Off continuous sedation as above -Goals of care discussions as below   Hypotension/shock: After PEA arrest. Vasopressors now weaned off. -Following off norepinephrine -Dosing diuretics daily, held on 10/10   A-fib with RVR: New onset.  Likely in the setting of arrest and cardiac abnormalities.  Converted back to NSR on 10/9 -IV amiodarone completed, remains in NSR -On scheduled metoprolol with hold parameters in place -Heparin infusion  AKI: Suspect hypervolemic, stable -Follow urine output, BMP  Hypernatremia, mild -Add free water depending on plans for diuretics   Right lower extremity cellulitis versus chronic venous changes -Doxycycline completed on 10/10  Goals of care:  -No family available but patient does have friends who are his primary contacts.  Spokesperson is Dealer.  Greatly appreciate Dr. Luisa Hart Wright's assistance.  He has served as the patient's PCP and had an opportunity to talk to [place] 10/10 as did I.  She understands that the patient has a poor prognosis for neurological recovery.  We have plan to follow him off sedation until 2022/11/09, assess status and decide regarding possible transition to comfort care.  Will follow-up with her today 2022/11/09   Best Practice (right click and "Reselect all SmartList Selections" daily)   Diet/type: Tube feeds DVT prophylaxis: systemic heparin GI prophylaxis: PPI Lines: N/A Foley:  N/A Code Status:  full code Last date of multidisciplinary goals of care discussion: Elizbeth Squires updated daily  Critical care time: 35 minutes    Levy Pupa, MD, PhD 11/09/22, 7:36 AM [facility] [phone] or if no answer before 7:00PM call [phone] For any issues  after 7:00PM please call eLink [phone]

## 2022-11-11 NOTE — Progress Notes (Signed)
Chaplain engaged in an initial visit with Ajai's friends and family at bedside. Chaplain engaged in narrative review as they shared about Keen and the relationships they had with him. Some had known Thong since he was just 52 years old and became apart of their family. They described Devante as having a hard exterior but that he had a really good heart.   Friends and family uplifted that Donovon is no longer suffering. Chaplain offered prayer over [place] before he passed. Chaplain also assisted nurse in giving memorial items to family such as finger prints and print-outs of his heartbeat.    11/04/2022 1400  Spiritual Encounters  Type of Visit Initial  Care provided to: Family;Friend  Reason for visit End-of-life  Interventions  Spiritual Care Interventions Made Bereavement/grief support;Prayer;Supported grief process;Established relationship of care and support;Compassionate presence

## 2022-11-11 NOTE — Progress Notes (Signed)
Patient is continuously biting down tongue with jaw clenched tight. Oropharyngeal airway in place as bite block. Small amount of trauma to tongue. Will continue to assess

## 2022-11-11 NOTE — Progress Notes (Signed)
PHARMACY - ANTICOAGULATION CONSULT NOTE  Pharmacy Consult for Heparin Indication: atrial fibrillation  No Known Allergies  Patient Measurements: Height: 5\' 8"  (172.7 cm) Weight: (!) 204 kg (449 lb 11.8 oz) IBW/kg (Calculated) : 68.4 Heparin Dosing Weight: 123 kg  Vital Signs: Temp: 98.3 F (36.8 C) (10/11 0800) Temp Source: Axillary (10/11 0800) BP: 153/81 (10/11 0608) Pulse Rate: 84 (10/11 0608)  Labs: Recent Labs    10/19/22 0510 10/20/22 0336 11/02/2022 0527  HGB 14.8 13.9 14.1  HCT 51.8 50.0 49.2  PLT 153 159 150  HEPARINUNFRC 0.41 0.49 0.56  CREATININE 1.03 1.19 1.02    Estimated Creatinine Clearance: 151.9 mL/min (by C-G formula based on SCr of 1.02 mg/dL).  Assessment: 49 yoM with PMH HFpEF, DM2, COPD, OSA/OHS, morbid obesity admitted 10/3 for fall. Suffered PEA arrest on 10/4, and subsequently developed AFib w/ RVR. Remains intubated but off pressors & sedation. Pharmacy consulted to dose IV heparin for Afib. No anticoagulation prior to admission.  Today, 10/23/2022: CBC remains stable WNL Daily heparin level therapeutic and stable on 2800 units/hr No bleeding or infusion issues per RN  Goal of Therapy:  Heparin level 0.3-0.7 units/ml Monitor platelets by anticoagulation protocol: Yes   Plan:  Continue heparin infusion at 2800 units/hr Monitor daily heparin level, CBC, signs/symptoms of bleeding F/u plans for long-term anticoagulation, pending clinical improvement and GOC decisions   Thank you for allowing pharmacy to be a part of this patient's care.  Lynden Ang, PharmD, BCPS Clinical Pharmacist 11/05/2022 8:24 AM

## 2022-11-11 DEATH — deceased

## 2022-11-14 IMAGING — CR DG LUMBAR SPINE COMPLETE 4+V
5 series · 5 of 5 positions shown · non-contrast
Comparison: 11/16/2020 CT reconstructions

CLINICAL DATA: Low back pain with radiation to the legs

EXAM:
LUMBAR SPINE - COMPLETE 4+ VIEW

[t l-spine a.p. *]
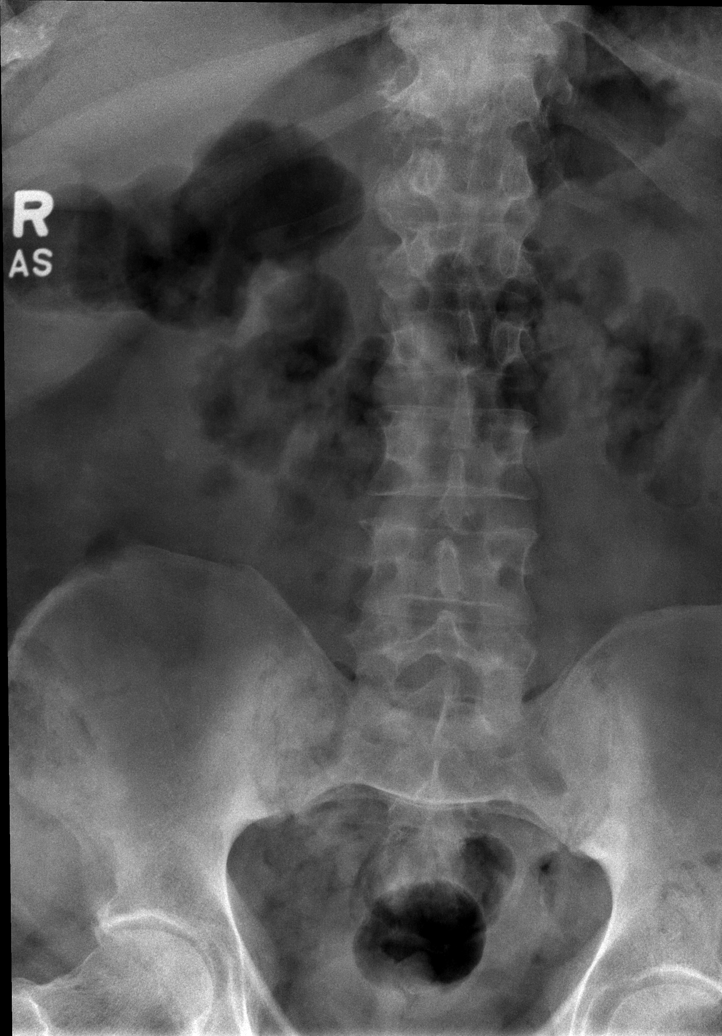

[t l-spine oblique exposure * (1 of 2)]
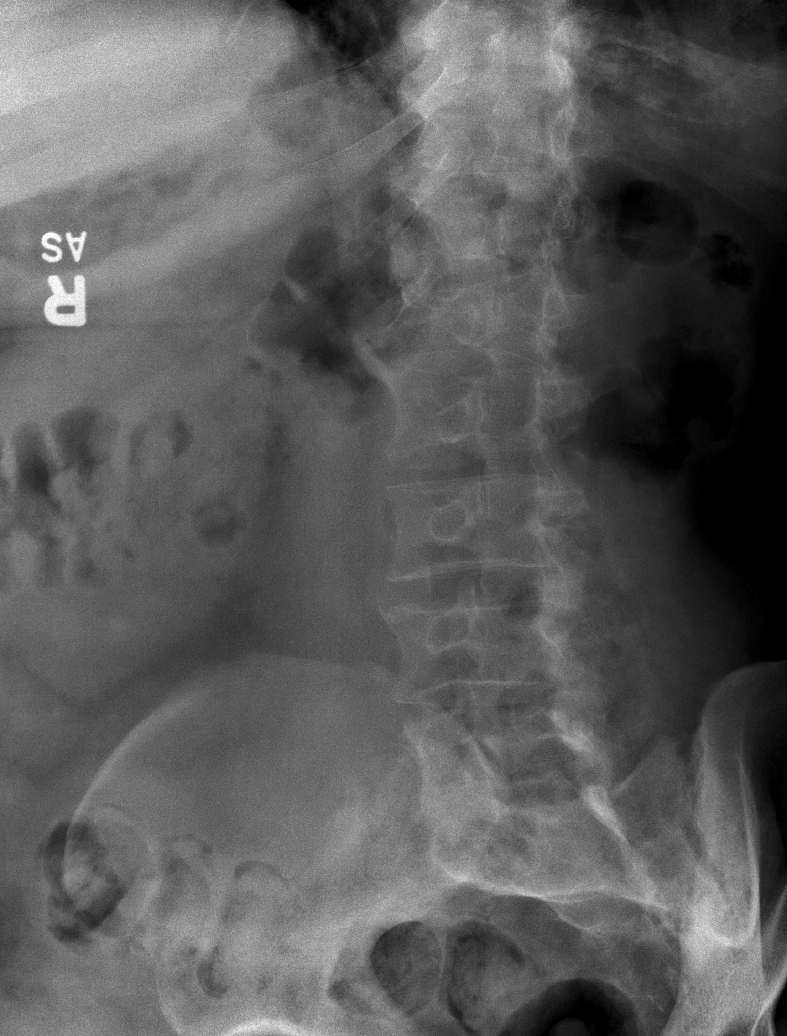

[t l-spine oblique exposure * (2 of 2)]
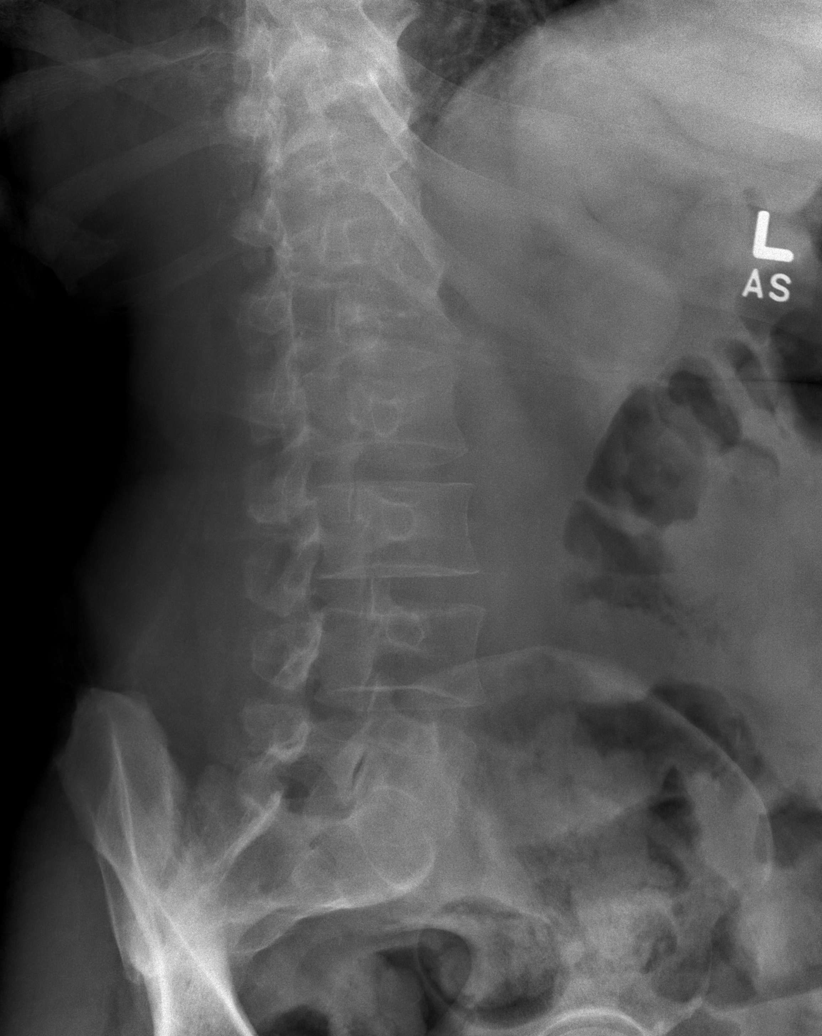

[t l-spine lat *]
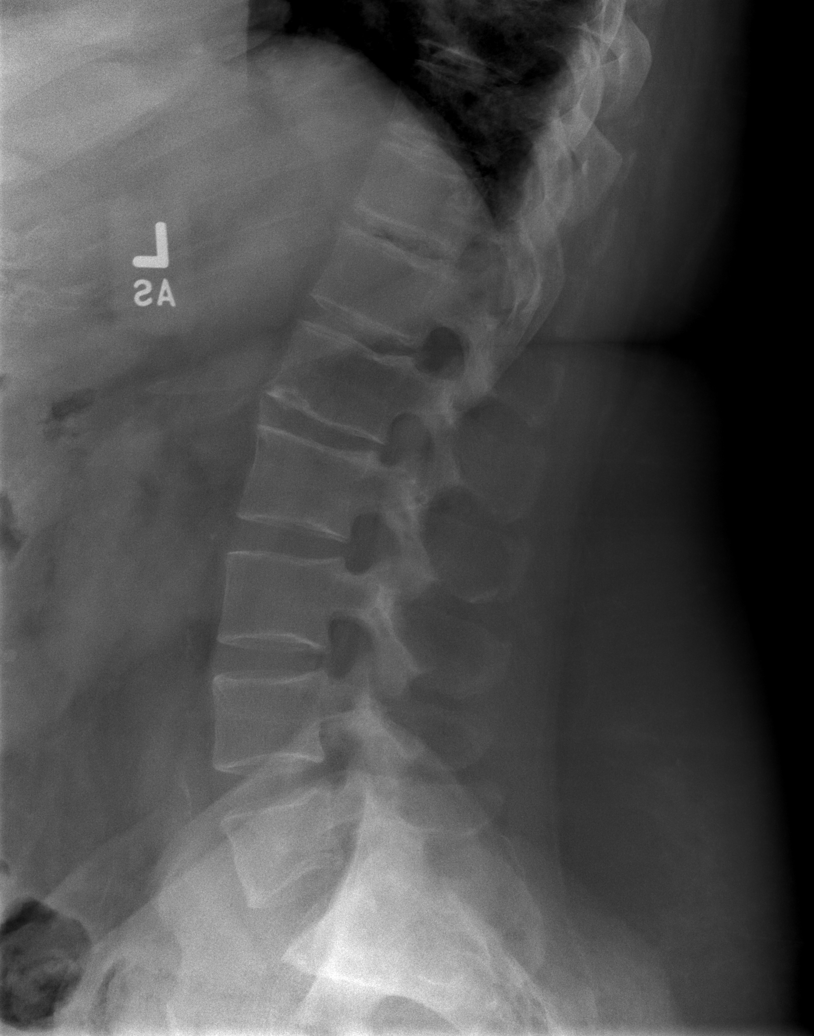

[t l-spine l5-s1 spot *]
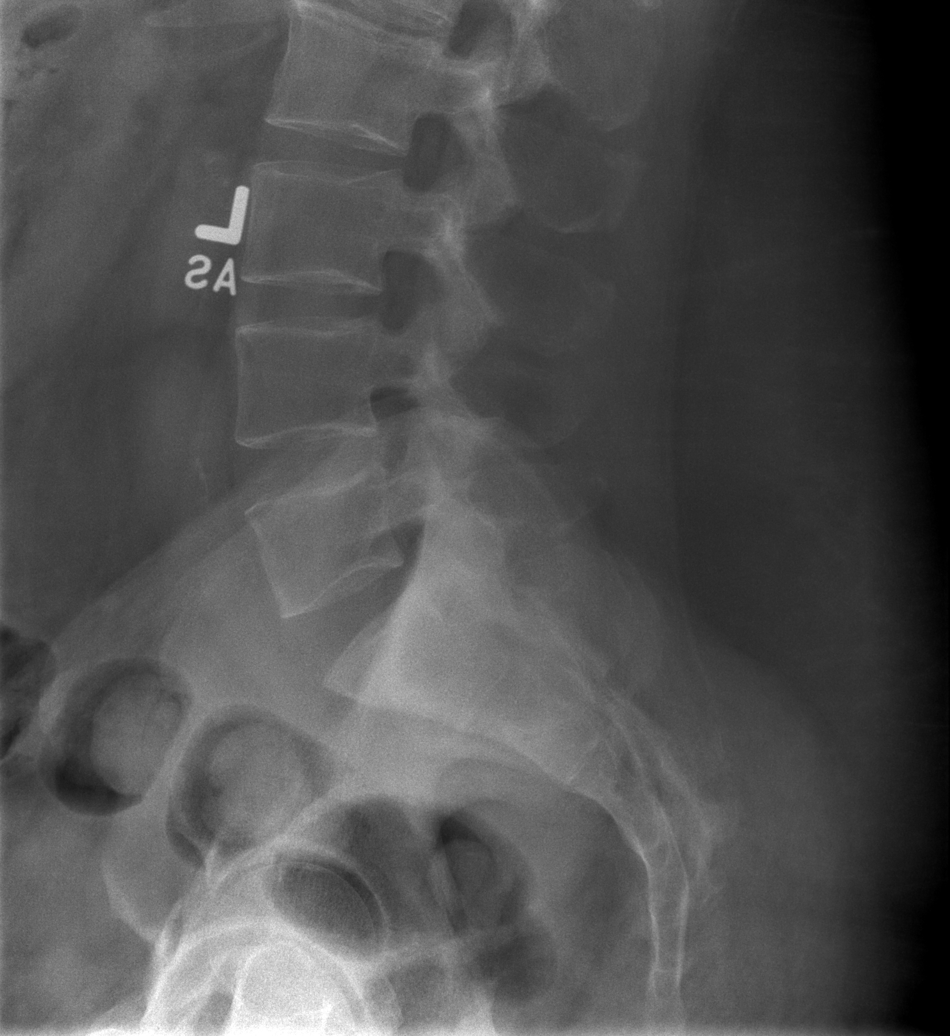

[5 of 5 positions shown; findings below may reference images not displayed]

FINDINGS: Normal alignment. Lower thoracic and upper lumbar mild degenerative
changes at multiple levels with disc space narrowing, endplate
sclerosis and bony spurring. These changes are demonstrated from T9
through L2. Preserved vertebral body heights. No acute compression
fracture. No wedge-shaped deformity. Facets are aligned. No pars
defects. Normal appearing pedicles. Chronic appearing similar
sclerosis of the SI joints bilaterally compatible with sacroiliitis.

Included pelvis intact.  Nonobstructive bowel gas pattern.
IMPRESSION: Lower thoracic and lumbar degenerative changes as above.

Bilateral SI joint arthropathy/sacroiliitis.

No acute finding by plain radiography

## 2023-11-20 ENCOUNTER — Other Ambulatory Visit (HOSPITAL_COMMUNITY): Payer: Self-pay
